# Patient Record
Sex: Female | Born: 1950
Health system: Southern US, Community
[De-identification: ages and names within clinical notes are randomized; demographics above are authoritative.]

## PROBLEM LIST (undated history)

## (undated) DIAGNOSIS — J45909 Unspecified asthma, uncomplicated: Secondary | ICD-10-CM

## (undated) DIAGNOSIS — F329 Major depressive disorder, single episode, unspecified: Secondary | ICD-10-CM

## (undated) DIAGNOSIS — M1990 Unspecified osteoarthritis, unspecified site: Secondary | ICD-10-CM

## (undated) DIAGNOSIS — T7840XA Allergy, unspecified, initial encounter: Secondary | ICD-10-CM

## (undated) DIAGNOSIS — B009 Herpesviral infection, unspecified: Secondary | ICD-10-CM

## (undated) DIAGNOSIS — F32A Depression, unspecified: Secondary | ICD-10-CM

## (undated) HISTORY — DX: Allergy, unspecified, initial encounter: T78.40XA

## (undated) HISTORY — DX: Herpesviral infection, unspecified: B00.9

## (undated) HISTORY — DX: Depression, unspecified: F32.A

## (undated) HISTORY — DX: Major depressive disorder, single episode, unspecified: F32.9

## (undated) HISTORY — DX: Unspecified osteoarthritis, unspecified site: M19.90

## (undated) HISTORY — PX: EYE SURGERY: SHX253

---

## 1982-11-17 HISTORY — PX: APPENDECTOMY: SHX54

## 1999-10-24 ENCOUNTER — Other Ambulatory Visit: Admission: RE | Admit: 1999-10-24 | Discharge: 1999-10-24 | Payer: Self-pay | Admitting: Gynecology

## 2000-11-12 ENCOUNTER — Other Ambulatory Visit: Admission: RE | Admit: 2000-11-12 | Discharge: 2000-11-12 | Payer: Self-pay | Admitting: Gynecology

## 2001-11-29 ENCOUNTER — Other Ambulatory Visit: Admission: RE | Admit: 2001-11-29 | Discharge: 2001-11-29 | Payer: Self-pay | Admitting: Gynecology

## 2002-12-07 ENCOUNTER — Other Ambulatory Visit: Admission: RE | Admit: 2002-12-07 | Discharge: 2002-12-07 | Payer: Self-pay | Admitting: Gynecology

## 2003-12-18 ENCOUNTER — Other Ambulatory Visit: Admission: RE | Admit: 2003-12-18 | Discharge: 2003-12-18 | Payer: Self-pay | Admitting: Gynecology

## 2004-12-30 ENCOUNTER — Other Ambulatory Visit: Admission: RE | Admit: 2004-12-30 | Discharge: 2004-12-30 | Payer: Self-pay | Admitting: Gynecology

## 2005-01-13 ENCOUNTER — Ambulatory Visit: Payer: Self-pay | Admitting: Family Medicine

## 2005-01-30 ENCOUNTER — Ambulatory Visit: Payer: Self-pay | Admitting: Family Medicine

## 2006-01-01 ENCOUNTER — Other Ambulatory Visit: Admission: RE | Admit: 2006-01-01 | Discharge: 2006-01-01 | Payer: Self-pay | Admitting: Gynecology

## 2006-02-10 ENCOUNTER — Ambulatory Visit: Payer: Self-pay | Admitting: Internal Medicine

## 2006-03-16 ENCOUNTER — Encounter (INDEPENDENT_AMBULATORY_CARE_PROVIDER_SITE_OTHER): Payer: Self-pay | Admitting: Specialist

## 2006-03-16 ENCOUNTER — Ambulatory Visit: Payer: Self-pay | Admitting: Internal Medicine

## 2006-04-02 ENCOUNTER — Ambulatory Visit: Payer: Self-pay | Admitting: Family Medicine

## 2006-10-12 ENCOUNTER — Ambulatory Visit: Payer: Self-pay | Admitting: Family Medicine

## 2007-04-08 ENCOUNTER — Ambulatory Visit: Payer: Self-pay | Admitting: Family Medicine

## 2007-04-08 LAB — CONVERTED CEMR LAB
Alkaline Phosphatase: 69 units/L (ref 39–117)
BUN: 14 mg/dL (ref 6–23)
Bilirubin, Direct: 0.1 mg/dL (ref 0.0–0.3)
CO2: 31 meq/L (ref 19–32)
GFR calc Af Amer: 112 mL/min
LDL Cholesterol: 123 mg/dL — ABNORMAL HIGH (ref 0–99)
Potassium: 4.7 meq/L (ref 3.5–5.1)
TSH: 1.44 microintl units/mL (ref 0.35–5.50)
Total CHOL/HDL Ratio: 3.3
Total Protein: 6.6 g/dL (ref 6.0–8.3)
Triglycerides: 67 mg/dL (ref 0–149)

## 2007-04-15 ENCOUNTER — Ambulatory Visit: Payer: Self-pay | Admitting: Family Medicine

## 2007-04-15 LAB — CONVERTED CEMR LAB
ALT: 20 units/L (ref 0–40)
AST: 21 units/L (ref 0–37)
Basophils Absolute: 0 10*3/uL (ref 0.0–0.1)
Bilirubin, Direct: 0.1 mg/dL (ref 0.0–0.3)
CO2: 31 meq/L (ref 19–32)
Calcium: 9.1 mg/dL (ref 8.4–10.5)
Chloride: 110 meq/L (ref 96–112)
Cholesterol: 196 mg/dL (ref 0–200)
Eosinophils Absolute: 0.2 10*3/uL (ref 0.0–0.6)
Glucose, Bld: 87 mg/dL (ref 70–99)
MCHC: 34 g/dL (ref 30.0–36.0)
MCV: 93.2 fL (ref 78.0–100.0)
Neutrophils Relative %: 60.6 % (ref 43.0–77.0)
Platelets: 228 10*3/uL (ref 150–400)
RBC: 3.89 M/uL (ref 3.87–5.11)
Total Protein: 6.6 g/dL (ref 6.0–8.3)

## 2007-04-16 DIAGNOSIS — J309 Allergic rhinitis, unspecified: Secondary | ICD-10-CM

## 2007-05-24 ENCOUNTER — Ambulatory Visit: Payer: Self-pay | Admitting: Family Medicine

## 2007-05-25 ENCOUNTER — Ambulatory Visit: Payer: Self-pay | Admitting: Family Medicine

## 2007-06-01 ENCOUNTER — Ambulatory Visit: Payer: Self-pay | Admitting: Family Medicine

## 2007-08-11 ENCOUNTER — Telehealth: Payer: Self-pay | Admitting: Family Medicine

## 2007-09-08 ENCOUNTER — Telehealth: Payer: Self-pay | Admitting: Family Medicine

## 2007-10-13 ENCOUNTER — Ambulatory Visit: Payer: Self-pay | Admitting: Family Medicine

## 2008-01-31 ENCOUNTER — Ambulatory Visit: Payer: Self-pay | Admitting: Family Medicine

## 2008-01-31 DIAGNOSIS — J209 Acute bronchitis, unspecified: Secondary | ICD-10-CM | POA: Insufficient documentation

## 2008-08-14 ENCOUNTER — Telehealth: Payer: Self-pay | Admitting: *Deleted

## 2009-05-16 ENCOUNTER — Encounter: Payer: Self-pay | Admitting: Family Medicine

## 2009-05-25 ENCOUNTER — Encounter: Payer: Self-pay | Admitting: Family Medicine

## 2009-08-09 ENCOUNTER — Telehealth: Payer: Self-pay | Admitting: Family Medicine

## 2009-08-22 ENCOUNTER — Ambulatory Visit: Payer: Self-pay | Admitting: Family Medicine

## 2009-12-18 ENCOUNTER — Ambulatory Visit: Payer: Self-pay | Admitting: Family Medicine

## 2009-12-18 DIAGNOSIS — M1710 Unilateral primary osteoarthritis, unspecified knee: Secondary | ICD-10-CM | POA: Insufficient documentation

## 2010-02-20 ENCOUNTER — Ambulatory Visit: Payer: Self-pay | Admitting: Family Medicine

## 2010-02-20 DIAGNOSIS — R071 Chest pain on breathing: Secondary | ICD-10-CM

## 2010-02-20 DIAGNOSIS — F339 Major depressive disorder, recurrent, unspecified: Secondary | ICD-10-CM | POA: Insufficient documentation

## 2010-02-20 DIAGNOSIS — F329 Major depressive disorder, single episode, unspecified: Secondary | ICD-10-CM

## 2010-03-19 ENCOUNTER — Ambulatory Visit: Payer: Self-pay | Admitting: Family Medicine

## 2010-05-24 ENCOUNTER — Telehealth: Payer: Self-pay | Admitting: Family Medicine

## 2010-06-27 ENCOUNTER — Telehealth: Payer: Self-pay | Admitting: Family Medicine

## 2010-08-12 ENCOUNTER — Encounter: Payer: Self-pay | Admitting: Family Medicine

## 2010-09-17 ENCOUNTER — Telehealth: Payer: Self-pay | Admitting: Family Medicine

## 2010-09-19 ENCOUNTER — Ambulatory Visit: Payer: Self-pay | Admitting: Family Medicine

## 2010-09-19 DIAGNOSIS — L738 Other specified follicular disorders: Secondary | ICD-10-CM | POA: Insufficient documentation

## 2010-12-17 NOTE — Assessment & Plan Note (Signed)
Summary: drainage/stuffy nose/sneezing/pain in back/cjr   Vital Signs:  Patient profile:   60 year old female Weight:      147 pounds Temp:     99.1 degrees F oral BP sitting:   102 / 76  (left arm) Cuff size:   regular  Vitals Entered By: Kern Reap CMA Duncan Dull) (December 18, 2009 3:46 PM)  Reason for Visit sinus pressure, drainage  History of Present Illness: Veronica Barrett is a 60 y/o mfem. who comes in today for evaluation of 3 problems.  For the past 4, weeks.  She's had had congestion, runny nose, postnasal drip, sinus pressure, unrelieved by her Allegra.  Review of systems otherwise negative.  Past 4, days she's had some right upper thoracic back pain.  No history of trauma.  She's also had a long-standing history of soreness in her hips.  Her mother had osteoarthritis  Allergies: No Known Drug Allergies  Past History:  Past medical, surgical, family and social histories (including risk factors) reviewed, and no changes noted (except as noted below).  Past Medical History: Reviewed history from 04/16/2007 and no changes required. HSV-VAG Allergic rhinitis  Family History: Reviewed history and no changes required. Family History of Arthritis  Social History: Reviewed history from 08/22/2009 and no changes required. Occupation: Married Never Smoked Alcohol use-no Drug use-no Regular exercise-yes  Review of Systems      See HPI  Physical Exam  General:  Well-developed,well-nourished,in no acute distress; alert,appropriate and cooperative throughout examination Head:  Normocephalic and atraumatic without obvious abnormalities. No apparent alopecia or balding. Eyes:  No corneal or conjunctival inflammation noted. EOMI. Perrla. Funduscopic exam benign, without hemorrhages, exudates or papilledema. Vision grossly normal. Ears:  External ear exam shows no significant lesions or deformities.  Otoscopic examination reveals clear canals, tympanic membranes are intact  bilaterally without bulging, retraction, inflammation or discharge. Hearing is grossly normal bilaterally. Nose:  External nasal examination shows no deformity or inflammation. Nasal mucosa are pink and moist without lesions or exudates. Mouth:  Oral mucosa and oropharynx without lesions or exudates.  Teeth in good repair. Neck:  No deformities, masses, or tenderness noted. Chest Wall:  No deformities, masses, or tenderness noted. Lungs:  Normal respiratory effort, chest expands symmetrically. Lungs are clear to auscultation, no crackles or wheezes. Msk:  straight leg raising both right and left lower extremities 75 degrees, external rotation to the right 30 to the left only 15 Pulses:  R and L carotid,radial,femoral,dorsalis pedis and posterior tibial pulses are full and equal bilaterally Extremities:  No clubbing, cyanosis, edema, or deformity noted with normal full range of motion of all joints.   Neurologic:  No cranial nerve deficits noted. Station and gait are normal. Plantar reflexes are down-going bilaterally. DTRs are symmetrical throughout. Sensory, motor and coordinative functions appear intact.   Impression & Recommendations:  Problem # 1:  PRIMARY LOCALIZED OSTEOARTHROSIS LOWER LEG (ICD-715.16) Assessment New  Problem # 2:  ALLERGIC RHINITIS (ICD-477.9) Assessment: Deteriorated  Her updated medication list for this problem includes:    Fexofenadine Hcl 180 Mg Tabs (Fexofenadine hcl) .Marland Kitchen... Take one tab daily  Orders: Prescription Created Electronically [id number])  Complete Medication List: 1)  Boniva 150 Mg Tabs (Ibandronate sodium) .Marland Kitchen.. 1 tab every month 2)  Prednisone 20 Mg Tabs (Prednisone) .... Uad 3)  Zovirax 200 Mg Caps (Acyclovir) .... 3 in am, 2in pm 4)  Fexofenadine Hcl 180 Mg Tabs (Fexofenadine hcl) .... Take one tab daily 5)  Premarin 0.625 Mg/gm Crea (Estrogens, conjugated) .Marland KitchenMarland KitchenMarland Kitchen  Apply 2 xweek  Patient Instructions: 1)  begin prednisone, take two tabs x 3 days,  one x 3 days, a half x 3 days, then half a tablet Monday, Wednesday, Friday, for a two week taper. 2)  You may also use one shot of afrin nasal spray up each nostril at bedtime x 5 nights. 3)  When y are  finished her prednisone intake 600 mg of Motrin twice a day with food for your arthritis, daily Prescriptions: PREDNISONE 20 MG  TABS (PREDNISONE) UAD  #30 x 1   Entered and Authorized by:   Roderick Pee MD   Signed by:   Roderick Pee MD on 12/18/2009   Method used:   [facility]        [facility] [address] [phone]* (retail)       [address]       [place], [place]  [id number]       Ph: [phone] or [phone]       Fax: [phone]   RxID:   [id number]

## 2010-12-17 NOTE — Assessment & Plan Note (Signed)
Summary: 4 week fup//ccm   Vital Signs:  Patient profile:   60 year old female Temp:     98.6 degrees F BP sitting:   108 / 72  (left arm) Cuff size:   regular  Vitals Entered By: Kern Reap CMA Duncan Dull) (Mar 19, 2010 10:24 AM) CC: follow-up visit   CC:  follow-up visit.  History of Present Illness: Veronica Barrett is a 60 year old, married female, nonsmoker, who comes in today for follow-up of depression.  We start on Celexa 20 mg daily.  She states she feels significantly better.  No major side effects except for some lethargy, but she thinks that might be related to  taking Zyrtec for allergic rhinitis  Allergies: No Known Drug Allergies  Review of Systems      See HPI  Physical Exam  General:  Well-developed,well-nourished,in no acute distress; alert,appropriate and cooperative throughout examination Psych:  Cognition and judgment appear intact. Alert and cooperative with normal attention span and concentration. No apparent delusions, illusions, hallucinations   Impression & Recommendations:  Problem # 1:  DEPRESSIVE DISORDER (ICD-311) Assessment Improved  Her updated medication list for this problem includes:    Celexa 20 Mg Tabs (Citalopram hydrobromide) .Marland Kitchen... 1 tab @ bedtime  Complete Medication List: 1)  Prednisone 20 Mg Tabs (Prednisone) .... Uad 2)  Zovirax 200 Mg Caps (Acyclovir) .... 3 in am, 2in pm 3)  Premarin 0.625 Mg/gm Crea (Estrogens, conjugated) .... Apply 2 xweek 4)  Zyrtec Hives Relief 10 Mg Tabs (Cetirizine hcl) .... Once daily 5)  Celexa 20 Mg Tabs (Citalopram hydrobromide) .Marland Kitchen.. 1 tab @ bedtime  Patient Instructions: 1)  Please schedule a follow-up appointment in 6 months. 2)  BMP prior to visit, ICD-9: 3)  Hepatic Panel prior to visit, ICD-9: 4)  Lipid Panel prior to visit, ICD-9: 5)  TSH prior to visit, ICD-9: 6)  CBC w/ Diff prior to visit, ICD-9: 7)  Urine-dip prior to visit, ICD-9:

## 2010-12-17 NOTE — Progress Notes (Signed)
Summary: increase Celexa?  Phone Note Call from Patient   Caller: Patient Call For: Roderick Pee MD Summary of Call: Pt increased  to Celexa 30 mg. 3 weeks ago and is doing great. Initial call taken by: Lynann Beaver CMA,  June 27, 2010 4:56 PM  Follow-up for Phone Call        continue the above dose E. mail her a new prescription for Celexa 20 mg dispense 150  tabs directions.  1 &  half nightly refills x 2 Follow-up by: Roderick Pee MD,  June 27, 2010 5:11 PM  Additional Follow-up for Phone Call Additional follow up Details #1::        Appt Scheduled Today    Additional Follow-up for Phone Call Additional follow up Details #2::    spoke with patient and she only wants a 30 day supply at this time.  rx called in. Follow-up by: Kern Reap CMA Duncan Dull),  June 28, 2010 3:19 PM  New/Updated Medications: CELEXA 20 MG TABS (CITALOPRAM HYDROBROMIDE) one and half  tab @ bedtime Prescriptions: CELEXA 20 MG TABS (CITALOPRAM HYDROBROMIDE) one and half  tab @ bedtime  #60 x 0   Entered by:   Kern Reap CMA (AAMA)   Authorized by:   Roderick Pee MD   Signed by:   Kern Reap CMA (AAMA) on 06/28/2010   Method used:   [facility]        [facility]  [address] [phone]* (retail)       [address]       [place], [place]  [address]       Ph: [phone] or [phone]       Fax: [phone]   RxID:   [id number]

## 2010-12-17 NOTE — Progress Notes (Signed)
Summary: refill  Phone Note Refill Request Message from:  Fax from Pharmacy on May 24, 2010 12:57 PM  Refills Requested: Medication #1:  ZOVIRAX 200 MG  CAPS 3 in am Initial call taken by: Kern Reap CMA Duncan Dull),  May 24, 2010 12:57 PM    Prescriptions: ZOVIRAX 200 MG  CAPS (ACYCLOVIR) 3 in am, 2in pm  #100 x 3   Entered by:   Kern Reap CMA (AAMA)   Authorized by:   Roderick Pee MD   Signed by:   Kern Reap CMA (AAMA) on 05/24/2010   Method used:   Faxed to ...       [facility]* (retail)             ,          Ph: [phone]       Fax: [phone]   RxID:   [id number]

## 2010-12-17 NOTE — Assessment & Plan Note (Signed)
Summary: 6 mo rov/mm   Vital Signs:  Patient profile:   60 year old female Height:      59 inches Weight:      149 pounds BMI:     26.49 Temp:     99.6 degrees F oral BP sitting:   110 / 76  (left arm) Cuff size:   regular  Vitals Entered By: Kern Reap CMA Duncan Dull) (September 19, 2010 10:25 AM) CC: follow-up visit, rash right arm   CC:  follow-up visit and rash right arm.  History of Present Illness: Veronica Barrett is a 60 year old, married female, nonsmoker comes in today for evaluation of a skin rash in to discuss her Celexa.  She's had a skin rash for about a week.  Under right armpit.  She thought initially it was viral, but now appears bacterial.  She takes Celexa 20 mg nightly with good relief of her symptoms.  She wishes to continue that.  Allergies: No Known Drug Allergies  Past History:  Past medical, surgical, family and social histories (including risk factors) reviewed for relevance to current acute and chronic problems.  Past Medical History: Reviewed history from 04/16/2007 and no changes required. HSV-VAG Allergic rhinitis  Family History: Reviewed history from 12/18/2009 and no changes required. Family History of Arthritis  Social History: Reviewed history from 08/22/2009 and no changes required. Occupation: Married Never Smoked Alcohol use-no Drug use-no Regular exercise-yes  Review of Systems      See HPI       Flu Vaccine Consent Questions     Do you have a history of severe allergic reactions to this vaccine? no    Any prior history of allergic reactions to egg and/or gelatin? no    Do you have a sensitivity to the preservative Thimersol? no    Do you have a past history of Guillan-Barre Syndrome? no    Do you currently have an acute febrile illness? no    Have you ever had a severe reaction to latex? no    Vaccine information given and explained to patient? yes    Are you currently pregnant? no    Lot Number:[id number]   Exp  Date:05/17/2011   Site Given  Left Deltoid IM   Physical Exam  General:  Well-developed,well-nourished,in no acute distress; alert,appropriate and cooperative throughout examination Skin:  small pustules, right axillary area   Impression & Recommendations:  Problem # 1:  FOLLICULITIS (ICD-704.8) Assessment New  Problem # 2:  DEPRESSIVE DISORDER (ICD-311) Assessment: Improved  Her updated medication list for this problem includes:    Celexa 20 Mg Tabs (Citalopram hydrobromide) ..... One and half  tab @ bedtime  Complete Medication List: 1)  Zovirax 200 Mg Caps (Acyclovir) .... 3 in am, 2in pm 2)  Premarin 0.625 Mg/gm Crea (Estrogens, conjugated) .... Apply 2 xweek 3)  Zyrtec Hives Relief 10 Mg Tabs (Cetirizine hcl) .... Once daily 4)  Celexa 20 Mg Tabs (Citalopram hydrobromide) .... One and half  tab @ bedtime 5)  Keflex 500 Mg Caps (Cephalexin) .... 2 by mouth two times a day  Other Orders: Admin 1st Vaccine (04540) Flu Vaccine 70yrs + (98119)  Patient Instructions: 1)  begin Keflex two tabs b.i.d., x 10 days call if the lesions do not heal with this medication. 2)  Continue the Celexa 20 mg daily. 3)  Return annually for your annual checkup Prescriptions: CELEXA 20 MG TABS (CITALOPRAM HYDROBROMIDE) one and half  tab @ bedtime  #150 x 3  Entered and Authorized by:   Roderick Pee MD   Signed by:   Roderick Pee MD on 09/19/2010   Method used:   Print then Give to Patient   RxID:   [id number] KEFLEX 500 MG CAPS (CEPHALEXIN) 2 by mouth two times a day  #40 x 1   Entered and Authorized by:   Roderick Pee MD   Signed by:   Roderick Pee MD on 09/19/2010   Method used:   [facility] to        [facility] [address] [phone]* (retail)       [address]       [place], [place]  [id number]       Ph: [phone] or [phone]       Fax: [phone]   RxID:   [id number] CELEXA 20 MG TABS (CITALOPRAM HYDROBROMIDE) one and half   tab @ bedtime  #100 x 3   Entered and Authorized by:   Roderick Pee MD   Signed by:   Roderick Pee MD on 09/19/2010   Method used:   Print then Give to Patient   RxID:   [id number]    Orders Added: 1)  Admin 1st Vaccine [90471] 2)  Flu Vaccine 58yrs + [09323] 3)  Est. Patient Level III [55732]

## 2010-12-17 NOTE — Progress Notes (Signed)
Summary: REFILL REQUEST  Phone Note Refill Request Message from:  Patient    [phone] on September 17, 2010 11:34 AM  Refills Requested: Medication #1:  CELEXA 20 MG TABS one and half  tab @ bedtime.   [facility]  [address].... Pt has appt on 09/19/2010 with Dr Tawanna Cooler.     Initial call taken by: Debbra Riding,  September 17, 2010 11:34 AM    Prescriptions: CELEXA 20 MG TABS (CITALOPRAM HYDROBROMIDE) one and half  tab @ bedtime  #60 Tablet x 0   Entered by:   Kern Reap CMA (AAMA)   Authorized by:   Roderick Pee MD   Signed by:   Kern Reap CMA (AAMA) on 09/17/2010   Method used:   [facility]ronically to        [facility]  [address] [phone]* (retail)       [address]       [place], [place]  [id number]       Ph: [phone] or [phone]       Fax: [phone]   RxID:   [id number]

## 2010-12-17 NOTE — Assessment & Plan Note (Signed)
Summary: INJURIES FROM A FALL (PT FELL ON 02/09/10-EXP PAIN SINCE) // RS   Vital Signs:  Patient profile:   60 year old female Weight:      149 pounds Temp:     98.2 degrees F oral BP sitting:   110 / 78  (right arm) Cuff size:   regular  Vitals Entered By: Kern Reap CMA Duncan Dull) (February 20, 2010 10:30 AM) CC: left breast pain Is Patient Diabetic? No Pain Assessment Patient in pain? yes     Location: breast   CC:  left breast pain.  History of Present Illness: Eliska is a 60 year old, married female, nonsmoker, who comes in today for evaluation of two problems.  On Saturday March, the 26th she slipped in her storage unit and hit her left breast.  She also bruised her left knee and left thigh.  The knee and thigh feel better.  However, she still having a lot of soreness in her left breast.  She's had a history of fibrocystic disease.  Last mammogram 13 months ago.  She's recently felt very overwhelmed at work.  She does total care for her husband Elijah Birk has emotional issues and is on multiple medications.  Now her daughter has told her she is a lesbian, but she's doing with some new stressors at work.  She states she is tearful for unknown reasons and with a labile mood.  She's never taken an antidepressant.  Allergies: No Known Drug Allergies  Past History:  Past medical, surgical, family and social histories (including risk factors) reviewed for relevance to current acute and chronic problems.  Past Medical History: Reviewed history from 04/16/2007 and no changes required. HSV-VAG Allergic rhinitis  Family History: Reviewed history from 12/18/2009 and no changes required. Family History of Arthritis  Social History: Reviewed history from 08/22/2009 and no changes required. Occupation: Married Never Smoked Alcohol use-no Drug use-no Regular exercise-yes  Review of Systems      See HPI  Physical Exam  General:  Well-developed,well-nourished,in no acute  distress; alert,appropriate and cooperative throughout examination Chest Wall:  No deformities, masses, or tenderness noted. Breasts:  she has diffuse fibrocystic changes throughout both breasts.  On palpation none of them cause the discomfort that she is experiencing.  When she holds her breasts up the pain goes away.  The chest wall is palpable.  No other masses Psych:  Oriented X3 and tearful.     Problems:  Medical Problems Added: 1)  Dx of Chest Wall Pain, Acute  (ICD-786.52) 2)  Dx of Depressive Disorder  (ICD-311)  Impression & Recommendations:  Problem # 1:  DEPRESSIVE DISORDER (ICD-311) Assessment New  Her updated medication list for this problem includes:    Celexa 20 Mg Tabs (Citalopram hydrobromide) .Marland Kitchen... 1 tab @ bedtime  Problem # 2:  CHEST WALL PAIN, ACUTE (ONG-295.28) Assessment: New  Complete Medication List: 1)  Prednisone 20 Mg Tabs (Prednisone) .... Uad 2)  Zovirax 200 Mg Caps (Acyclovir) .... 3 in am, 2in pm 3)  Premarin 0.625 Mg/gm Crea (Estrogens, conjugated) .... Apply 2 xweek 4)  Zyrtec Hives Relief 10 Mg Tabs (Cetirizine hcl) .... Once daily 5)  Celexa 20 Mg Tabs (Citalopram hydrobromide) .Marland Kitchen.. 1 tab @ bedtime  Patient Instructions: 1)  begin Motrin 600 mg twice a day with food for the chest wall pain. 2)  Disorder should last for about 4 to 6 weeks and then go away.  Schedule yearly mammogram after the soreness has stopped.  Begin Celexa 20 mg  at bedtime.  Return in 4 weeks for follow-up Prescriptions: CELEXA 20 MG TABS (CITALOPRAM HYDROBROMIDE) 1 tab @ bedtime  #100 x 3   Entered and Authorized by:   Roderick Pee MD   Signed by:   Roderick Pee MD on 02/20/2010   Method used:   Electronically to        [facility] [address] [phone]* (retail)       [address]       [place], [place]  [address]       Ph: [phone] or [phone]       Fax: [phone]   RxID:   [id number]

## 2011-02-20 ENCOUNTER — Ambulatory Visit (INDEPENDENT_AMBULATORY_CARE_PROVIDER_SITE_OTHER): Payer: BC Managed Care – PPO | Admitting: Family Medicine

## 2011-02-20 ENCOUNTER — Encounter: Payer: Self-pay | Admitting: Family Medicine

## 2011-02-20 DIAGNOSIS — L738 Other specified follicular disorders: Secondary | ICD-10-CM

## 2011-02-20 DIAGNOSIS — L255 Unspecified contact dermatitis due to plants, except food: Secondary | ICD-10-CM

## 2011-02-20 MED ORDER — PREDNISONE 20 MG PO TABS: ORAL_TABLET | ORAL | Status: DC

## 2011-02-20 NOTE — Progress Notes (Signed)
  Subjective:    Patient ID: Veronica Barrett, female    DOB: 07/29/1951, 60 y.o.   MRN: [id number]  HPIOne this is a 60 year old female, who comes in today for two problems.  For the past, day.  She's had a recurrence of her contact dermatitis, which involves both arms.  A spot on her face and a spot on her back.  She, thinks she's also had a recurrence of the folliculitis in the right axillary area    Review of Systems    General and dermatologic review of systems otherwise negative Objective:   Physical Exam    Well-developed well-nourished, female, in no acute distress.  Examination of the skin shows one pimple in the right axillary area.  It was cleaned with alcohol and cover with a Band-Aid and antibiotic ointment.  She has extent extensive contact dermatitis, right and left arm    Assessment & Plan:  Contact dermatitis,,,,,,,,,,, prednisone burst and taper.  Folliculitis,,,,,,,,,, clean with alcohol daily, apply antibiotic ointment and Band-Aid Keflex p.r.n.

## 2011-02-20 NOTE — Patient Instructions (Signed)
Take the prednisone as directed.  Clean, the . Follicular lesion twice daily with alcohol applied in ATB Ointment twice daily.  Restart the Keflex if the lesions get worse

## 2011-06-05 ENCOUNTER — Telehealth: Payer: Self-pay | Admitting: Family Medicine

## 2011-06-05 MED ORDER — ACYCLOVIR 200 MG PO CAPS: 200.0000 mg | ORAL_CAPSULE | Freq: Every day | ORAL | Status: DC

## 2011-06-05 NOTE — Telephone Encounter (Signed)
[facility] [address]. Was using mail order pharmacy.

## 2011-07-14 ENCOUNTER — Other Ambulatory Visit: Payer: Self-pay | Admitting: Gynecology

## 2011-10-14 ENCOUNTER — Other Ambulatory Visit: Payer: Self-pay | Admitting: Family Medicine

## 2011-11-24 ENCOUNTER — Ambulatory Visit (INDEPENDENT_AMBULATORY_CARE_PROVIDER_SITE_OTHER)
Admission: RE | Admit: 2011-11-24 | Discharge: 2011-11-24 | Disposition: A | Discharge status: Home / Self Care | Payer: BC Managed Care – PPO | Source: Ambulatory Visit | Attending: Family Medicine | Admitting: Family Medicine

## 2011-11-24 ENCOUNTER — Ambulatory Visit (INDEPENDENT_AMBULATORY_CARE_PROVIDER_SITE_OTHER): Payer: BC Managed Care – PPO | Admitting: Family Medicine

## 2011-11-24 ENCOUNTER — Encounter: Payer: Self-pay | Admitting: Family Medicine

## 2011-11-24 VITALS — BP 108/70 | Temp 97.8°F | Wt 149.0 lb

## 2011-11-24 DIAGNOSIS — M1710 Unilateral primary osteoarthritis, unspecified knee: Secondary | ICD-10-CM

## 2011-11-24 NOTE — Patient Instructions (Signed)
Go to the main office now for x-rays of her hips.  Call  [facility] and asked to see Dr. Albertha Ghee or Dr. Norlene Campbell

## 2011-11-24 NOTE — Progress Notes (Signed)
  Subjective:    Patient ID: Veronica Barrett, female    DOB: [birth year], 61 y.o.   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  She states her pain in her left hip started about July.  Now it seems to be getting worse.  No history of trauma.  Neurologic review of systems otherwise negative   Review of Systems    General neurologic orthopedic review of systems negative Objective:   Physical Exam  Barrett-developed Barrett-nourished, female, in no acute distress.  Examination of both legs.  They appear normal.  Circulation, normal sensation.  Muscle strength and reflexes.  Normal.  Unable to externally rotate her Left  hip      Assessment & Plan:  Severe degenerative joint change.  Left hip.  Plan x-rays or so consult

## 2012-01-12 ENCOUNTER — Encounter: Payer: Self-pay | Admitting: Internal Medicine

## 2012-10-02 ENCOUNTER — Other Ambulatory Visit: Payer: Self-pay | Admitting: Family Medicine

## 2012-10-12 ENCOUNTER — Other Ambulatory Visit: Payer: Self-pay | Admitting: Family Medicine

## 2012-10-25 ENCOUNTER — Other Ambulatory Visit: Payer: Self-pay | Admitting: Family Medicine

## 2012-12-28 ENCOUNTER — Ambulatory Visit (INDEPENDENT_AMBULATORY_CARE_PROVIDER_SITE_OTHER): Payer: BC Managed Care – PPO | Admitting: Family Medicine

## 2012-12-28 ENCOUNTER — Encounter: Payer: Self-pay | Admitting: Family Medicine

## 2012-12-28 VITALS — BP 110/70 | Temp 98.5°F | Wt 158.0 lb

## 2012-12-28 DIAGNOSIS — J309 Allergic rhinitis, unspecified: Secondary | ICD-10-CM

## 2012-12-28 DIAGNOSIS — R05 Cough: Secondary | ICD-10-CM

## 2012-12-28 DIAGNOSIS — R071 Chest pain on breathing: Secondary | ICD-10-CM

## 2012-12-28 DIAGNOSIS — R059 Cough, unspecified: Secondary | ICD-10-CM

## 2012-12-28 MED ORDER — PREDNISONE 20 MG PO TABS: ORAL_TABLET | ORAL | Status: DC

## 2012-12-28 NOTE — Progress Notes (Signed)
  Subjective:    Patient ID: Veronica Barrett, female    DOB: 1951-03-04, 62 y.o.   MRN: [id number]  HPI Veronica Barrett is a 62 year old married female nonsmoker who comes in today for evaluation of 2 problems  She developed a viral syndrome around Christmas time and the cold went away but the cough has persisted. She has no fever chills or sputum production except occasionally she says in the morning she will cough up some blood but she thinks is coming from her sinus. She is a nonsmoker  She also had one episode that lasts about 15 minutes of right upper anterior chest wall pain the woke up in the middle the night. Last general medical exam was a while back  GYN-wise she's been asymptomatic it recommend every 3 your Paps   Review of Systems    cardiopulmonary review of systems otherwise negative no exertional chest pain Objective:   Physical Exam Well-developed and nourished female no acute distress cardiopulmonary exam normal except for some mild symmetrical late expiratory wheezing       Assessment & Plan:

## 2012-12-28 NOTE — Patient Instructions (Addendum)
Take the prednisone as directed  Return sometime in the next 2-3 months for general physical examination  Fasting labs one week prior

## 2013-02-10 ENCOUNTER — Other Ambulatory Visit: Payer: BC Managed Care – PPO

## 2013-02-17 ENCOUNTER — Encounter: Payer: BC Managed Care – PPO | Admitting: Family Medicine

## 2013-04-14 ENCOUNTER — Other Ambulatory Visit (INDEPENDENT_AMBULATORY_CARE_PROVIDER_SITE_OTHER): Payer: BC Managed Care – PPO

## 2013-04-14 DIAGNOSIS — R071 Chest pain on breathing: Secondary | ICD-10-CM

## 2013-04-14 LAB — BASIC METABOLIC PANEL
BUN: 15 mg/dL (ref 6–23)
CO2: 28 mEq/L (ref 19–32)
Chloride: 104 mEq/L (ref 96–112)
Creatinine, Ser: 0.6 mg/dL (ref 0.4–1.2)
Glucose, Bld: 76 mg/dL (ref 70–99)

## 2013-04-14 LAB — POCT URINALYSIS DIPSTICK
Ketones, UA: NEGATIVE
Leukocytes, UA: NEGATIVE
Protein, UA: NEGATIVE
pH, UA: 7

## 2013-04-14 LAB — CBC WITH DIFFERENTIAL/PLATELET
Basophils Absolute: 0 10*3/uL (ref 0.0–0.1)
Eosinophils Relative: 3.1 % (ref 0.0–5.0)
HCT: 36.5 % (ref 36.0–46.0)
Lymphs Abs: 1.6 10*3/uL (ref 0.7–4.0)
MCV: 93.2 fl (ref 78.0–100.0)
Monocytes Absolute: 0.4 10*3/uL (ref 0.1–1.0)
Neutro Abs: 2.8 10*3/uL (ref 1.4–7.7)
Platelets: 284 10*3/uL (ref 150.0–400.0)
RDW: 13.6 % (ref 11.5–14.6)

## 2013-04-14 LAB — HEPATIC FUNCTION PANEL
AST: 15 U/L (ref 0–37)
Alkaline Phosphatase: 72 U/L (ref 39–117)
Total Bilirubin: 0.8 mg/dL (ref 0.3–1.2)

## 2013-04-14 LAB — LIPID PANEL
HDL: 60.7 mg/dL (ref >39.00)
LDL Cholesterol: 123 mg/dL — ABNORMAL HIGH (ref 0–99)
Total CHOL/HDL Ratio: 3
VLDL: 12.2 mg/dL (ref 0.0–40.0)

## 2013-04-15 ENCOUNTER — Encounter: Payer: Self-pay | Admitting: Internal Medicine

## 2013-04-21 ENCOUNTER — Ambulatory Visit (INDEPENDENT_AMBULATORY_CARE_PROVIDER_SITE_OTHER): Payer: BC Managed Care – PPO | Admitting: Family Medicine

## 2013-04-21 ENCOUNTER — Other Ambulatory Visit (HOSPITAL_COMMUNITY)
Admission: RE | Admit: 2013-04-21 | Discharge: 2013-04-21 | Disposition: A | Discharge status: Home / Self Care | Payer: BC Managed Care – PPO | Source: Ambulatory Visit | Attending: Family Medicine | Admitting: Family Medicine

## 2013-04-21 ENCOUNTER — Encounter: Payer: Self-pay | Admitting: Family Medicine

## 2013-04-21 VITALS — BP 110/70 | Temp 98.3°F | Ht 65.75 in | Wt 155.0 lb

## 2013-04-21 DIAGNOSIS — Z01419 Encounter for gynecological examination (general) (routine) without abnormal findings: Secondary | ICD-10-CM | POA: Insufficient documentation

## 2013-04-21 DIAGNOSIS — Z23 Encounter for immunization: Secondary | ICD-10-CM

## 2013-04-21 DIAGNOSIS — Z0000 Encounter for general adult medical examination without abnormal findings: Secondary | ICD-10-CM

## 2013-04-21 DIAGNOSIS — B009 Herpesviral infection, unspecified: Secondary | ICD-10-CM

## 2013-04-21 DIAGNOSIS — M169 Osteoarthritis of hip, unspecified: Secondary | ICD-10-CM | POA: Insufficient documentation

## 2013-04-21 MED ORDER — CITALOPRAM HYDROBROMIDE 20 MG PO TABS: ORAL_TABLET | ORAL | Status: DC

## 2013-04-21 MED ORDER — ESTROGENS, CONJUGATED 0.625 MG/GM VA CREA: TOPICAL_CREAM | VAGINAL | Status: DC

## 2013-04-21 MED ORDER — ACYCLOVIR 200 MG PO CAPS: ORAL_CAPSULE | ORAL | Status: DC

## 2013-04-21 NOTE — Progress Notes (Signed)
  Subjective:    Patient ID: Veronica Barrett, female    DOB: Sep 29, 1951, 62 y.o.   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 She takes acyclovir when necessary  She takes Zyrtec 10 mg each bedtime for allergic rhinitis and Celexa 20 mg tabs........... 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   HENT: Negative.   Eyes: Negative.   Respiratory: Negative.   Cardiovascular: Negative.   Gastrointestinal: Negative.   Genitourinary: Negative.   Musculoskeletal: Negative.   Neurological: Negative.   Psychiatric/Behavioral: Negative.        Objective:   Physical Exam  Constitutional: She appears well-developed and well-nourished.  HENT:  Head: Normocephalic and atraumatic.  Right Ear: External ear normal.  Left Ear: External ear normal.  Nose: Nose normal.  Mouth/Throat: Oropharynx is clear and moist.  Eyes: EOM are normal. Pupils are equal, round, and reactive to light.  Neck: Normal range of motion. Neck supple. No thyromegaly present.  Cardiovascular: Normal rate, regular rhythm, normal heart sounds and intact distal pulses.  Exam reveals no gallop and no friction rub.   No murmur heard. Pulmonary/Chest: Effort normal and breath sounds normal.  Abdominal: Soft. Bowel sounds are normal. She exhibits no distension and no mass. There is no tenderness. There is no  rebound.  Genitourinary: Vagina normal and uterus normal. Guaiac negative stool. No vaginal discharge found.  Bilateral breast exam shows multiple fibrocystic changes throughout both breasts thickening especially right breast 10:00 at the periphery of the breast tissue  Musculoskeletal: Normal range of motion.  Lymphadenopathy:    She has no cervical adenopathy.  Neurological: She is alert. She has normal reflexes. No cranial nerve deficit. She exhibits normal muscle tone. Coordination normal.  Skin: Skin is warm and dry.  Psychiatric: She has a normal mood and affect. Her behavior is normal. Judgment and thought content normal.   Severe limitation of external rotation left hip only 15.,,,,,,, recent orthopedic evaluation so to osteoarthritis in both hips worse in the left.       Assessment & Plan:  Healthy female  Allergic rhinitis continue Zyrtec  Recurrent HSV 1 right buttocks continue acyclovir when necessary  History of mild depression continue Celexa 30 mg daily  Postmenopausal vaginal dryness Premarin vaginal cream twice weekly  Fibrocystic breast changes recommend BSE monthly and 3-D mammogram  Osteoarthritis right and left hip worse on the right Motrin 400 twice a day exercise at some point she continue to total hip replacement.  Information given on shingles

## 2013-04-21 NOTE — Patient Instructions (Signed)
Motrin 400 mg twice daily with food  Daily exercises  Sunscreens SPF 50+  Continue your other medications  D. Use the vaginal cream twice weekly  Return in one year for general physical exam sooner if any problems  Call the breast Center and get set up for a 3-D mammogram because of your history of multiple fibrocystic changes and thickening

## 2013-05-04 ENCOUNTER — Other Ambulatory Visit: Payer: Self-pay

## 2013-05-04 DIAGNOSIS — Z1231 Encounter for screening mammogram for malignant neoplasm of breast: Secondary | ICD-10-CM

## 2013-05-09 ENCOUNTER — Ambulatory Visit
Admission: RE | Admit: 2013-05-09 | Discharge: 2013-05-09 | Disposition: A | Discharge status: Home / Self Care | Payer: BC Managed Care – PPO | Source: Ambulatory Visit

## 2013-05-09 DIAGNOSIS — Z1231 Encounter for screening mammogram for malignant neoplasm of breast: Secondary | ICD-10-CM

## 2013-06-01 ENCOUNTER — Ambulatory Visit (AMBULATORY_SURGERY_CENTER): Payer: BC Managed Care – PPO | Admitting: *Deleted

## 2013-06-01 ENCOUNTER — Encounter: Payer: Self-pay | Admitting: Internal Medicine

## 2013-06-01 VITALS — Ht 65.5 in | Wt 159.4 lb

## 2013-06-01 DIAGNOSIS — Z86010 Personal history of colonic polyps: Secondary | ICD-10-CM

## 2013-06-01 MED ORDER — MOVIPREP 100 G PO SOLR: ORAL | Status: DC

## 2013-06-01 NOTE — Progress Notes (Signed)
No allergies to eggs or soy. No problems with anesthesia.  

## 2013-06-15 ENCOUNTER — Encounter: Payer: Self-pay | Admitting: Internal Medicine

## 2013-06-15 ENCOUNTER — Ambulatory Visit (AMBULATORY_SURGERY_CENTER): Payer: BC Managed Care – PPO | Admitting: Internal Medicine

## 2013-06-15 VITALS — BP 128/77 | HR 45 | Temp 97.3°F | Resp 15 | Ht 65.5 in | Wt 159.0 lb

## 2013-06-15 DIAGNOSIS — D126 Benign neoplasm of colon, unspecified: Secondary | ICD-10-CM

## 2013-06-15 DIAGNOSIS — Z86010 Personal history of colonic polyps: Secondary | ICD-10-CM

## 2013-06-15 MED ORDER — SODIUM CHLORIDE 0.9 % IV SOLN: 500.0000 mL | INTRAVENOUS | Status: DC

## 2013-06-15 NOTE — Patient Instructions (Addendum)
YOU HAD AN ENDOSCOPIC PROCEDURE TODAY AT THE [facility]: Refer to the procedure report that was given to you for any specific questions about what was found during the examination.  If the procedure report does not answer your questions, please call your gastroenterologist to clarify.  If you requested that your care partner not be given the details of your procedure findings, then the procedure report has been included in a sealed envelope for you to review at your convenience later.  YOU SHOULD EXPECT: Some feelings of bloating in the abdomen. Passage of more gas than usual.  Walking can help get rid of the air that was put into your GI tract during the procedure and reduce the bloating. If you had a lower endoscopy (such as a colonoscopy or flexible sigmoidoscopy) you may notice spotting of blood in your stool or on the toilet paper. If you underwent a bowel prep for your procedure, then you may not have a normal bowel movement for a few days.  DIET: Your first meal following the procedure should be a light meal and then it is ok to progress to your normal diet.  A half-sandwich or bowl of soup is an example of a good first meal.  Heavy or fried foods are harder to digest and may make you feel nauseous or bloated.  Likewise meals heavy in dairy and vegetables can cause extra gas to form and this can also increase the bloating.  Drink plenty of fluids but you should avoid alcoholic beverages for 24 hours.  ACTIVITY: Your care partner should take you home directly after the procedure.  You should plan to take it easy, moving slowly for the rest of the day.  You can resume normal activity the day after the procedure however you should NOT DRIVE or use heavy machinery for 24 hours (because of the sedation medicines used during the test).    SYMPTOMS TO REPORT IMMEDIATELY: A gastroenterologist can be reached at any hour.  During normal business hours, 8:30 AM to 5:00 PM Monday through Friday,  call [phone].  After hours and on weekends, please call the GI answering service at [phone] who will take a message and have the physician on call contact you.   Following lower endoscopy (colonoscopy or flexible sigmoidoscopy):  Excessive amounts of blood in the stool  Significant tenderness or worsening of abdominal pains  Swelling of the abdomen that is new, acute  Fever of 100F or higher  FOLLOW UP: If any biopsies were taken you will be contacted by phone or by letter within the next 1-3 weeks.  Call your gastroenterologist if you have not heard about the biopsies in 3 weeks.  Our staff will call the home number listed on your records the next business day following your procedure to check on you and address any questions or concerns that you may have at that time regarding the information given to you following your procedure. This is a courtesy call and so if there is no answer at the home number and we have not heard from you through the emergency physician on call, we will assume that you have returned to your regular daily activities without incident.  SIGNATURES/CONFIDENTIALITY: You and/or your care partner have signed paperwork which will be entered into your electronic medical record.  These signatures attest to the fact that that the information above on your After Visit Summary has been reviewed and is understood.  Full responsibility of the confidentiality of this   discharge information lies with you and/or your care-partner.  Polyp and high fiber diet information given. 

## 2013-06-15 NOTE — Progress Notes (Signed)
Called to room to assist during endoscopic procedure.  Patient ID and intended procedure confirmed with present staff. Received instructions for my participation in the procedure from the performing physician.  

## 2013-06-15 NOTE — Op Note (Signed)
[facility] [address] [place], [address]   COLONOSCOPY PROCEDURE REPORT  PATIENT: Veronica, Barrett  MR#: [id number] BIRTHDATE: [birth year] , 62  yrs. old GENDER: Female ENDOSCOPIST: Hart Carwin, MD REFERRED AO:ZHYQMV colonoscopy PROCEDURE DATE:  06/15/2013 PROCEDURE:   Colonoscopy with snare polypectomy First Screening Colonoscopy - Avg.  risk and is 50 yrs.  old or older - No.  Prior Negative Screening - Now for repeat screening. N/A  History of Adenoma - Now for follow-up colonoscopy & has been > or = to 3 yrs.  Yes hx of adenoma.  Has been 3 or more years since last colonoscopy.  Polyps Removed Today? Yes. ASA CLASS:   Class II INDICATIONS:Patient's personal history of colon polyps., hyperplastic polyp 2007 MEDICATIONS: MAC sedation, administered by CRNA and propofol (Diprivan) 250mg  IV  DESCRIPTION OF PROCEDURE:   After the risks benefits and alternatives of the procedure were thoroughly explained, informed consent was obtained.  A digital rectal exam revealed no abnormalities of the rectum.   The LB [id number]-H190 [id number]  endoscope was introduced through the anus and advanced to the cecum, which was identified by both the appendix and ileocecal valve. No adverse events experienced.   The quality of the prep was good, using MoviPrep  The instrument was then slowly withdrawn as the colon was fully examined.      COLON FINDINGS: A smooth sessile polyp ranging between 3-86mm in size was found in the sigmoid colon.  A polypectomy was performed with a cold snare.  The resection was complete and the polyp tissue was completely retrieved.  Retroflexed views revealed no abnormalities. The time to cecum=  .  Withdrawal time=  .  The scope was withdrawn and the procedure completed. COMPLICATIONS: There were no complications.  ENDOSCOPIC IMPRESSION: Sessile polyp ranging between 3-76mm in size was found in the sigmoid colon; polypectomy was performed with a cold  snare  RECOMMENDATIONS: 1.  Await pathology results 2.  High fiber diet   eSigned:  Hart Carwin, MD 06/15/2013 10:43 AM   cc:

## 2013-06-15 NOTE — Progress Notes (Signed)
Lidocaine-40mg IV prior to Propofol InductionPropofol given over incremental dosages 

## 2013-06-15 NOTE — Progress Notes (Signed)
Patient did not experience any of the following events: a burn prior to discharge; a fall within the facility; wrong site/side/patient/procedure/implant event; or a hospital transfer or hospital admission upon discharge from the facility. (G8907) Patient did not have preoperative order for IV antibiotic SSI prophylaxis. (G8918)  

## 2013-06-16 ENCOUNTER — Telehealth: Payer: Self-pay | Admitting: *Deleted

## 2013-06-16 NOTE — Telephone Encounter (Signed)
  Follow up Call-  Call back number 06/15/2013  Post procedure Call Back phone  # [phone]  Permission to leave phone message Yes     Patient questions:  Do you have a fever, pain , or abdominal swelling? no Pain Score  0 *  Have you tolerated food without any problems? yes  Have you been able to return to your normal activities? yes  Do you have any questions about your discharge instructions: Diet   no Medications  no Follow up visit  no  Do you have questions or concerns about your Care? no  Actions: * If pain score is 4 or above: No action needed, pain <4.

## 2013-06-21 ENCOUNTER — Encounter: Payer: Self-pay | Admitting: Internal Medicine

## 2013-10-04 ENCOUNTER — Other Ambulatory Visit: Payer: Self-pay | Admitting: Family Medicine

## 2013-11-14 ENCOUNTER — Ambulatory Visit (INDEPENDENT_AMBULATORY_CARE_PROVIDER_SITE_OTHER): Payer: BC Managed Care – PPO | Admitting: Physician Assistant

## 2013-11-14 VITALS — BP 114/68 | HR 67 | Temp 99.2°F | Resp 18 | Ht 66.0 in | Wt 156.0 lb

## 2013-11-14 DIAGNOSIS — J029 Acute pharyngitis, unspecified: Secondary | ICD-10-CM

## 2013-11-14 DIAGNOSIS — J069 Acute upper respiratory infection, unspecified: Secondary | ICD-10-CM

## 2013-11-14 DIAGNOSIS — B309 Viral conjunctivitis, unspecified: Secondary | ICD-10-CM

## 2013-11-14 LAB — POCT RAPID STREP A (OFFICE): Rapid Strep A Screen: NEGATIVE

## 2013-11-14 MED ORDER — GUAIFENESIN ER 1200 MG PO TB12: 1.0000 | ORAL_TABLET | Freq: Two times a day (BID) | ORAL | Status: AC

## 2013-11-14 MED ORDER — HYDROCOD POLST-CHLORPHEN POLST 10-8 MG/5ML PO LQCR: 5.0000 mL | Freq: Two times a day (BID) | ORAL | Status: AC

## 2013-11-14 NOTE — Patient Instructions (Signed)
Keep contacts out of eye while they are red.  You should get rid of the lenses that are in your eyes today.

## 2013-11-14 NOTE — Progress Notes (Signed)
   Subjective:    Patient ID: Veronica Barrett, female    DOB: April 25, 1951, 62 y.o.   MRN: [id number]  HPI Pt presents to clinic with cold symptoms for the last week.  She has congestion which has gotten a little better and PND with a cough.  She takes Zyrtec daily but after she got sick her symptoms got worse when she ran out of the Zyrtec.  She then woke up yesterday am with her right eye matted but no more drainage throughout the day.  She feels like the eye is less red today and feels a little less irritated.  She does feel like her left eye is started to get irritated.  She is having trouble seeing at work.  She wears contacts and she has her contacts in today.  She is typically on Restasis but has not been on it for several days.  Each day she is sick her throat gets more sore.  OTC meds - sudafed, No sick contacts Review of Systems  Constitutional: Negative for fever and chills.  HENT: Positive for congestion, postnasal drip, rhinorrhea and sore throat.   Eyes: Positive for discharge (matted in the am yesterday), itching and visual disturbance (mild). Negative for photophobia and pain.  Respiratory: Positive for cough (grayish).   Musculoskeletal: Negative for myalgias.  Allergic/Immunologic: Positive for environmental allergies.  Neurological: Positive for headaches (intermittent).  Psychiatric/Behavioral: Negative for sleep disturbance.       Objective:   Physical Exam  Vitals reviewed. Constitutional: She is oriented to person, place, and time. She appears well-developed and well-nourished.  HENT:  Head: Normocephalic and atraumatic.  Right Ear: Hearing, tympanic membrane, external ear and ear canal normal.  Left Ear: Hearing, tympanic membrane, external ear and ear canal normal.  Nose: Nose normal.  Mouth/Throat: Uvula is midline and mucous membranes are normal. Posterior oropharyngeal erythema present. No posterior oropharyngeal edema.  Eyes: Conjunctivae are normal.    Cardiovascular: Normal rate, regular rhythm and normal heart sounds.   No murmur heard. Pulmonary/Chest: Effort normal and breath sounds normal. She has no wheezes.  Lymphadenopathy:    She has cervical adenopathy (Ac tender but not really enlarged).  Neurological: She is alert and oriented to person, place, and time.  Skin: Skin is warm and dry.  Psychiatric: She has a normal mood and affect. Her behavior is normal. Judgment and thought content normal.    Results for orders placed in visit on 11/14/13  POCT RAPID STREP A (OFFICE)      Result Value Range   Rapid Strep A Screen Negative  Negative       Assessment & Plan:  Viral URI with cough - Plan: Guaifenesin (MUCINEX MAXIMUM STRENGTH) 1200 MG TB12, chlorpheniramine-HYDROcodone (TUSSIONEX PENNKINETIC ER) 10-8 MG/5ML LQCR  Viral conjunctivitis -   Sore throat - Plan: POCT rapid strep A -   Please push fluids.  Tylenol and Motrin for fever and body aches.    Benny Lennert PA-C 11/14/2013 2:43 PM

## 2014-04-05 ENCOUNTER — Telehealth: Payer: Self-pay | Admitting: Family Medicine

## 2014-04-05 NOTE — Telephone Encounter (Signed)
Pt would like dr todd recommendation for orthopaedic md who specialist in hip replacement.

## 2014-04-05 NOTE — Telephone Encounter (Signed)
Spoke with patient.

## 2014-07-05 ENCOUNTER — Telehealth: Payer: Self-pay | Admitting: Family Medicine

## 2014-07-05 NOTE — Telephone Encounter (Signed)
Pt would like to speak with you regarding finding a hip doctor, pt states she does not a referral but what dr. Sherren Mocha opinion on dr. Jannett Celestine.

## 2014-07-06 NOTE — Telephone Encounter (Signed)
Left message on machine for patient. Okay per Dr Sherren Mocha

## 2014-07-14 ENCOUNTER — Other Ambulatory Visit: Payer: Self-pay | Admitting: Family Medicine

## 2014-07-30 ENCOUNTER — Other Ambulatory Visit: Payer: Self-pay | Admitting: Family Medicine

## 2014-08-03 ENCOUNTER — Other Ambulatory Visit: Payer: Self-pay | Admitting: Family Medicine

## 2014-10-02 ENCOUNTER — Encounter: Payer: Self-pay | Admitting: Family Medicine

## 2014-10-02 ENCOUNTER — Ambulatory Visit (INDEPENDENT_AMBULATORY_CARE_PROVIDER_SITE_OTHER): Payer: BC Managed Care – PPO | Admitting: Family Medicine

## 2014-10-02 VITALS — BP 124/80 | Temp 98.5°F | Wt 161.0 lb

## 2014-10-02 DIAGNOSIS — Z23 Encounter for immunization: Secondary | ICD-10-CM

## 2014-10-02 DIAGNOSIS — M160 Bilateral primary osteoarthritis of hip: Secondary | ICD-10-CM

## 2014-10-02 DIAGNOSIS — Z01818 Encounter for other preprocedural examination: Secondary | ICD-10-CM

## 2014-10-02 MED ORDER — CITALOPRAM HYDROBROMIDE 20 MG PO TABS: ORAL_TABLET | ORAL | Status: DC

## 2014-10-02 NOTE — Progress Notes (Signed)
Pre visit review using our clinic review tool, if applicable. No additional management support is needed unless otherwise documented below in the visit note. 

## 2014-10-02 NOTE — Patient Instructions (Addendum)
Good luck in your surgery  Follow-up as outlined  Return for your physical exam this winter a month or 2 after surgery

## 2014-10-02 NOTE — Progress Notes (Signed)
   Subjective:    Patient ID: Veronica Barrett, female    DOB: 1951-04-07, 63 y.o.   MRN: [id number]  HPI Veronica Barrett is a 63 year old married female nonsmoker who comes in today for presurgical appointment because she's going to have a total left hip by Dr. Farrel Conners  I sent her to see them a while back because of pain in her left hip. X-ray shows degenerative changes in both hips worse on the left than the right. Her right hip is actually asymptomatic pain wise although she has bone rubbing on bone on her right hip also.  No history of trauma that she recalls arthritis and degenerative joint disease does run in her family her weight is always been good she is in the 150-160 range she has no history of hypertension diabetes hyperlipidemia and she is a nonsmoker.  Surgical risk low   Review of Systems    review of systems otherwise negative Objective:   Physical Exam  Well-developed well-nourished female no acute distress vital signs stable she's afebrile BP right arm sitting position 124/80 cardiopulmonary exam normal      Assessment & Plan:  preop exam normal,,,,,,,,, okay for surgery,,,,,,, risk low

## 2014-10-03 ENCOUNTER — Telehealth: Payer: Self-pay | Admitting: Family Medicine

## 2014-10-03 NOTE — Telephone Encounter (Signed)
noted 

## 2014-10-03 NOTE — Telephone Encounter (Signed)
Patient states she does not need a letter of medical necessity.

## 2014-10-06 NOTE — H&P (Signed)
TOTAL HIP ADMISSION H&P  Patient is admitted for left total hip arthroplasty, anterior approach.  Subjective:  Chief Complaint:    Left hip primary OA / pain  HPI: Veronica Barrett, 63 y.o. female, has a history of pain and functional disability in the left hip(s) due to arthritis and patient has failed non-surgical conservative treatments for greater than 12 weeks to include NSAID's and/or analgesics, corticosteriod injections and activity modification.  Onset of symptoms was gradual starting years ago with gradually worsening course since that time.The patient noted no past surgery on the left hip(s).  Patient currently rates pain in the left hip at 5 out of 10 with activity. Patient has worsening of pain with activity and weight bearing, trendelenberg gait, pain that interfers with activities of daily living and pain with passive range of motion. Patient has evidence of periarticular osteophytes and joint space narrowing by imaging studies. This condition presents safety issues increasing the risk of falls.  There is no current active infection.  Risks, benefits and expectations were discussed with the patient.  Risks including but not limited to the risk of anesthesia, blood clots, nerve damage, blood vessel damage, failure of the prosthesis, infection and up to and including death.  Patient understand the risks, benefits and expectations and wishes to proceed with surgery.   PCP: Joycelyn Man, MD  D/C Plans:      Home with HHPT  Post-op Meds:       No Rx given  Tranexamic Acid:      To be given - IV    Decadron:      Is to be given  FYI:     ASA post-op  Norco post-op  Do NOT reorder Restasis while in the hospital    Patient Active Problem List   Diagnosis Date Noted  . Recurrent HSV (herpes simplex virus) 04/21/2013  . Osteoarthritis of hip 04/21/2013  . DEPRESSIVE DISORDER 02/20/2010  . ALLERGIC RHINITIS 04/16/2007   Past Medical History  Diagnosis Date  . HSV infection     vag  . Allergy   . Depression   . Arthritis     Past Surgical History  Procedure Laterality Date  . Appendectomy  1984    No prescriptions prior to admission   No Known Allergies   History  Substance Use Topics  . Smoking status: Never Smoker   . Smokeless tobacco: Never Used  . Alcohol Use: 4.2 oz/week    7 Glasses of wine per week    Family History  Problem Relation Age of Onset  . Arthritis Other   . Colon cancer Neg Hx   . Cancer Father   . Cancer Sister      Review of Systems  Constitutional: Negative.   HENT: Negative.   Eyes: Negative.   Respiratory: Negative.   Cardiovascular: Negative.   Gastrointestinal: Negative.   Genitourinary: Negative.   Musculoskeletal: Positive for back pain and joint pain.  Skin: Negative.   Neurological: Negative.   Endo/Heme/Allergies: Positive for environmental allergies.  Psychiatric/Behavioral: Positive for depression.    Objective:  Physical Exam  Constitutional: She is oriented to person, place, and time. She appears well-developed and well-nourished.  HENT:  Head: Normocephalic and atraumatic.  Eyes: Pupils are equal, round, and reactive to light.  Neck: Neck supple. No JVD present. No tracheal deviation present. No thyromegaly present.  Cardiovascular: Normal rate, regular rhythm, normal heart sounds and intact distal pulses.   Respiratory: Effort normal and breath sounds normal. No  stridor. No respiratory distress. She has no wheezes.  GI: Soft. There is no tenderness. There is no guarding.  Musculoskeletal:       Left hip: She exhibits decreased range of motion, decreased strength, tenderness and bony tenderness. She exhibits no swelling, no deformity and no laceration.  Lymphadenopathy:    She has no cervical adenopathy.  Neurological: She is alert and oriented to person, place, and time.  Skin: Skin is warm and dry.  Psychiatric: She has a normal mood and affect.      Labs:  Estimated body mass index  is 25.19 kg/(m^2) as calculated from the following:   Height as of 11/14/13: 5\' 6"  (1.676 m).   Weight as of 11/14/13: 70.761 kg (156 lb).   Imaging Review Plain radiographs demonstrate severe degenerative joint disease of the left hip(s). The bone quality appears to be good for age and reported activity level.  Assessment/Plan:  End stage arthritis, left hip(s)  The patient history, physical examination, clinical judgement of the provider and imaging studies are consistent with end stage degenerative joint disease of the left hip(s) and total hip arthroplasty is deemed medically necessary. The treatment options including medical management, injection therapy, arthroscopy and arthroplasty were discussed at length. The risks and benefits of total hip arthroplasty were presented and reviewed. The risks due to aseptic loosening, infection, stiffness, dislocation/subluxation,  thromboembolic complications and other imponderables were discussed.  The patient acknowledged the explanation, agreed to proceed with the plan and consent was signed. Patient is being admitted for inpatient treatment for surgery, pain control, PT, OT, prophylactic antibiotics, VTE prophylaxis, progressive ambulation and ADL's and discharge planning.The patient is planning to be discharged home with home health services.     [place] P[place] Theola Cuellar   PA-C  10/06/2014, 12:25 PM

## 2014-10-10 ENCOUNTER — Encounter (HOSPITAL_COMMUNITY)
Admission: RE | Admit: 2014-10-10 | Discharge: 2014-10-10 | Disposition: A | Discharge status: Home / Self Care | Payer: BC Managed Care – PPO | Source: Ambulatory Visit | Attending: Orthopedic Surgery | Admitting: Orthopedic Surgery

## 2014-10-10 ENCOUNTER — Encounter (HOSPITAL_COMMUNITY): Payer: Self-pay

## 2014-10-10 DIAGNOSIS — Z01812 Encounter for preprocedural laboratory examination: Secondary | ICD-10-CM | POA: Insufficient documentation

## 2014-10-10 LAB — BASIC METABOLIC PANEL
ANION GAP: 9 (ref 5–15)
BUN: 14 mg/dL (ref 6–23)
CALCIUM: 9.4 mg/dL (ref 8.4–10.5)
CO2: 27 mEq/L (ref 19–32)
Chloride: 100 mEq/L (ref 96–112)
Creatinine, Ser: 0.71 mg/dL (ref 0.50–1.10)
GFR calc Af Amer: >90 mL/min (ref >90)
GLUCOSE: 84 mg/dL (ref 70–99)
POTASSIUM: 4.7 meq/L (ref 3.7–5.3)
SODIUM: 136 meq/L — AB (ref 137–147)

## 2014-10-10 LAB — URINALYSIS, ROUTINE W REFLEX MICROSCOPIC
Bilirubin Urine: NEGATIVE
GLUCOSE, UA: NEGATIVE mg/dL
Hgb urine dipstick: NEGATIVE
Ketones, ur: NEGATIVE mg/dL
LEUKOCYTES UA: NEGATIVE
NITRITE: NEGATIVE
PH: 7 (ref 5.0–8.0)
PROTEIN: NEGATIVE mg/dL
Specific Gravity, Urine: 1.015 (ref 1.005–1.030)
Urobilinogen, UA: 0.2 mg/dL (ref 0.0–1.0)

## 2014-10-10 LAB — SURGICAL PCR SCREEN
MRSA, PCR: NEGATIVE
Staphylococcus aureus: POSITIVE — AB

## 2014-10-10 LAB — CBC
HEMATOCRIT: 39 % (ref 36.0–46.0)
Hemoglobin: 12.6 g/dL (ref 12.0–15.0)
MCH: 30.9 pg (ref 26.0–34.0)
MCHC: 32.3 g/dL (ref 30.0–36.0)
MCV: 95.6 fL (ref 78.0–100.0)
PLATELETS: 273 10*3/uL (ref 150–400)
RBC: 4.08 MIL/uL (ref 3.87–5.11)
RDW: 12.8 % (ref 11.5–15.5)
WBC: 5.9 10*3/uL (ref 4.0–10.5)

## 2014-10-10 LAB — PROTIME-INR
INR: 0.99 (ref 0.00–1.49)
Prothrombin Time: 13.2 seconds (ref 11.6–15.2)

## 2014-10-10 LAB — APTT: APTT: 28 s (ref 24–37)

## 2014-10-10 NOTE — Pre-Procedure Instructions (Signed)
10-10-14 EKG 10-02-14 Epic.

## 2014-10-10 NOTE — Patient Instructions (Addendum)
Veronica Barrett  10/10/2014   Your procedure is scheduled on:   10-17-2014 Tuesday  Enter through [facility] Entrance and follow signs to [facility]. Arrive at   1130     AM..  Call this number if you have problems the morning of surgery: [phone]  Or Presurgical Testing [phone].   For Living Will and/or Health Care Power Attorney Forms: please provide copy for your medical record,may bring AM of surgery(Forms should be already notarized -we do not provide this service).(10-10-14 No information preferred today).  For Cpap use: Bring mask and tubing only.   Do not eat food/ or drink: After Midnight.  Exception: may have clear liquids:up to 6 Hours before arrival. Nothing after: 0800 AM  Clear liquids include soda, tea, black coffee, apple or grape juice, broth.  Take these medicines the morning of surgery with A SIP OF WATER: Acyclovir(if need)   Do not wear jewelry, make-up or nail polish.  Do not wear deodorant, lotions, powders, or perfumes.   Do not shave legs and under arms- 48 hours(2 days) prior to first CHG shower.(Shaving face and neck okay.)  Do not bring valuables to the hospital.(Hospital is not responsible for lost valuables).  Contacts, dentures or removable bridgework, body piercing, hair pins may not be worn into surgery.  Leave suitcase in the car. After surgery it may be brought to your room.  For patients admitted to the hospital, checkout time is 11:00 AM the day of discharge.(Restricted visitors-Any Persons displaying flu-like symptoms or illness).    Patients discharged the day of surgery will not be allowed to drive home. Must have responsible person with you x 24 hours once discharged.  Name and phone number of your driver: Veronica Barrett spouse [phone] home     Please read over the following fact sheets that you were given:  CHG(Chlorhexidine Gluconate 4% Surgical Soap) use, MRSA Information, Blood Transfusion fact sheet, Incentive Spirometry  Instruction.  Remember : Type/Screen "Blue armbands" - may not be removed once applied(would result in being retested AM of surgery, if removed).         [place] - Preparing for Surgery Before surgery, you can play an important role.  Because skin is not sterile, your skin needs to be as free of germs as possible.  You can reduce the number of germs on your skin by washing with CHG (chlorahexidine gluconate) soap before surgery.  CHG is an antiseptic cleaner which kills germs and bonds with the skin to continue killing germs even after washing. Please DO NOT use if you have an allergy to CHG or antibacterial soaps.  If your skin becomes reddened/irritated stop using the CHG and inform your nurse when you arrive at Short Stay. Do not shave (including legs and underarms) for at least 48 hours prior to the first CHG shower.  You may shave your face/neck. Please follow these instructions carefully:  1.  Shower with CHG Soap the night before surgery and the  morning of Surgery.  2.  If you choose to wash your hair, wash your hair first as usual with your  normal  shampoo.  3.  After you shampoo, rinse your hair and body thoroughly to remove the  shampoo.                           4.  Use CHG as you would any other liquid soap.  You can apply chg  directly  to the skin and wash                       Gently with a scrungie or clean washcloth.  5.  Apply the CHG Soap to your body ONLY FROM THE NECK DOWN.   Do not use on face/ open                           Wound or open sores. Avoid contact with eyes, ears mouth and genitals (private parts).                       Wash face,  Genitals (private parts) with your normal soap.             6.  Wash thoroughly, paying special attention to the area where your surgery  will be performed.  7.  Thoroughly rinse your body with warm water from the neck down.  8.  DO NOT shower/wash with your normal soap after using and rinsing off  the CHG Soap.                 9.  Pat yourself dry with a clean towel.            10.  Wear clean pajamas.            11.  Place clean sheets on your bed the night of your first shower and do not  sleep with pets. Day of Surgery : Do not apply any lotions/deodorants the morning of surgery.  Please wear clean clothes to the hospital/surgery center.  FAILURE TO FOLLOW THESE INSTRUCTIONS MAY RESULT IN THE CANCELLATION OF YOUR SURGERY PATIENT SIGNATURE_________________________________  NURSE SIGNATURE__________________________________  ________________________________________________________________________   Veronica Barrett  An incentive spirometer is a tool that can help keep your lungs clear and active. This tool measures how well you are filling your lungs with each breath. Taking long deep breaths may help reverse or decrease the chance of developing breathing (pulmonary) problems (especially infection) following:  A long period of time when you are unable to move or be active. BEFORE THE PROCEDURE   If the spirometer includes an indicator to show your best effort, your nurse or respiratory therapist will set it to a desired goal.  If possible, sit up straight or lean slightly forward. Try not to slouch.  Hold the incentive spirometer in an upright position. INSTRUCTIONS FOR USE   Sit on the edge of your bed if possible, or sit up as far as you can in bed or on a chair.  Hold the incentive spirometer in an upright position.  Breathe out normally.  Place the mouthpiece in your mouth and seal your lips tightly around it.  Breathe in slowly and as deeply as possible, raising the piston or the ball toward the top of the column.  Hold your breath for 3-5 seconds or for as long as possible. Allow the piston or ball to fall to the bottom of the column.  Remove the mouthpiece from your mouth and breathe out normally.  Rest for a few seconds and repeat Steps 1 through 7 at least 10 times every 1-2 hours  when you are awake. Take your time and take a few normal breaths between deep breaths.  The spirometer may include an indicator to show your best effort. Use the indicator as a goal to work toward during each repetition.  After each set of 10 deep breaths, practice coughing to be sure your lungs are clear. If you have an incision (the cut made at the time of surgery), support your incision when coughing by placing a pillow or rolled up towels firmly against it. Once you are able to get out of bed, walk around indoors and cough well. You may stop using the incentive spirometer when instructed by your caregiver.  RISKS AND COMPLICATIONS  Take your time so you do not get dizzy or light-headed.  If you are in pain, you may need to take or ask for pain medication before doing incentive spirometry. It is harder to take a deep breath if you are having pain. AFTER USE  Rest and breathe slowly and easily.  It can be helpful to keep track of a log of your progress. Your caregiver can provide you with a simple table to help with this. If you are using the spirometer at home, follow these instructions: [place] IF:   You are having difficultly using the spirometer.  You have trouble using the spirometer as often as instructed.  Your pain medication is not giving enough relief while using the spirometer.  You develop fever of 100.5 F (38.1 C) or higher. SEEK IMMEDIATE MEDICAL CARE IF:   You cough up bloody sputum that had not been present before.  You develop fever of 102 F (38.9 C) or greater.  You develop worsening pain at or near the incision site. MAKE SURE YOU:   Understand these instructions.  Will watch your condition.  Will get help right away if you are not doing well or get worse. Document Released: 03/16/2007 Document Revised: 01/26/2012 Document Reviewed: 05/17/2007 ExitCare Patient Information 2014 ExitCare,  [place].   ________________________________________________________________________  WHAT IS A BLOOD TRANSFUSION? Blood Transfusion Information  A transfusion is the replacement of blood or some of its parts. Blood is made up of multiple cells which provide different functions.  Red blood cells carry oxygen and are used for blood loss replacement.  White blood cells fight against infection.  Platelets control bleeding.  Plasma helps clot blood.  Other blood products are available for specialized needs, such as hemophilia or other clotting disorders. BEFORE THE TRANSFUSION  Who gives blood for transfusions?   Healthy volunteers who are fully evaluated to make sure their blood is safe. This is blood bank blood. Transfusion therapy is the safest it has ever been in the practice of medicine. Before blood is taken from a donor, a complete history is taken to make sure that person has no history of diseases nor engages in risky social behavior (examples are intravenous drug use or sexual activity with multiple partners). The donor's travel history is screened to minimize risk of transmitting infections, such as malaria. The donated blood is tested for signs of infectious diseases, such as HIV and hepatitis. The blood is then tested to be sure it is compatible with you in order to minimize the chance of a transfusion reaction. If you or a relative donates blood, this is often done in anticipation of surgery and is not appropriate for emergency situations. It takes many days to process the donated blood. RISKS AND COMPLICATIONS Although transfusion therapy is very safe and saves many lives, the main dangers of transfusion include:   Getting an infectious disease.  Developing a transfusion reaction. This is an allergic reaction to something in the blood you were given. Every precaution is taken to prevent this. The decision to  have a blood transfusion has been considered carefully by your caregiver  before blood is given. Blood is not given unless the benefits outweigh the risks. AFTER THE TRANSFUSION  Right after receiving a blood transfusion, you will usually feel much better and more energetic. This is especially true if your red blood cells have gotten low (anemic). The transfusion raises the level of the red blood cells which carry oxygen, and this usually causes an energy increase.  The nurse administering the transfusion will monitor you carefully for complications. HOME CARE INSTRUCTIONS  No special instructions are needed after a transfusion. You may find your energy is better. Speak with your caregiver about any limitations on activity for underlying diseases you may have. SEEK MEDICAL CARE IF:   Your condition is not improving after your transfusion.  You develop redness or irritation at the intravenous (IV) site. SEEK IMMEDIATE MEDICAL CARE IF:  Any of the following symptoms occur over the next 12 hours:  Shaking chills.  You have a temperature by mouth above 102 F (38.9 C), not controlled by medicine.  Chest, back, or muscle pain.  People around you feel you are not acting correctly or are confused.  Shortness of breath or difficulty breathing.  Dizziness and fainting.  You get a rash or develop hives.  You have a decrease in urine output.  Your urine turns a dark color or changes to pink, red, or brown. Any of the following symptoms occur over the next 10 days:  You have a temperature by mouth above 102 F (38.9 C), not controlled by medicine.  Shortness of breath.  Weakness after normal activity.  The white part of the eye turns yellow (jaundice).  You have a decrease in the amount of urine or are urinating less often.  Your urine turns a dark color or changes to pink, red, or brown. Document Released: 10/31/2000 Document Revised: 01/26/2012 Document Reviewed: 06/19/2008 [facility] Patient Information 2014 [place],  [place].  _______________________________________________________________________

## 2014-10-17 ENCOUNTER — Encounter (HOSPITAL_COMMUNITY)
Admission: RE | Disposition: A | Discharge status: Home / Self Care | Payer: Self-pay | Source: Ambulatory Visit | Attending: Orthopedic Surgery

## 2014-10-17 ENCOUNTER — Inpatient Hospital Stay (HOSPITAL_COMMUNITY): Payer: BC Managed Care – PPO

## 2014-10-17 ENCOUNTER — Ambulatory Visit (HOSPITAL_COMMUNITY): Payer: BC Managed Care – PPO

## 2014-10-17 ENCOUNTER — Inpatient Hospital Stay (HOSPITAL_COMMUNITY)
Admission: RE | Admit: 2014-10-17 | Discharge: 2014-10-18 | DRG: 470 | Disposition: A | Discharge status: Home / Self Care | Payer: BC Managed Care – PPO | Source: Ambulatory Visit | Attending: Orthopedic Surgery | Admitting: Orthopedic Surgery

## 2014-10-17 ENCOUNTER — Ambulatory Visit (HOSPITAL_COMMUNITY): Payer: BC Managed Care – PPO | Admitting: Anesthesiology

## 2014-10-17 ENCOUNTER — Encounter (HOSPITAL_COMMUNITY): Payer: Self-pay | Admitting: *Deleted

## 2014-10-17 DIAGNOSIS — M1612 Unilateral primary osteoarthritis, left hip: Principal | ICD-10-CM | POA: Diagnosis present

## 2014-10-17 DIAGNOSIS — M25552 Pain in left hip: Secondary | ICD-10-CM | POA: Diagnosis present

## 2014-10-17 DIAGNOSIS — F329 Major depressive disorder, single episode, unspecified: Secondary | ICD-10-CM | POA: Diagnosis present

## 2014-10-17 DIAGNOSIS — J309 Allergic rhinitis, unspecified: Secondary | ICD-10-CM | POA: Diagnosis present

## 2014-10-17 DIAGNOSIS — Z96649 Presence of unspecified artificial hip joint: Secondary | ICD-10-CM

## 2014-10-17 DIAGNOSIS — B009 Herpesviral infection, unspecified: Secondary | ICD-10-CM | POA: Diagnosis present

## 2014-10-17 DIAGNOSIS — I959 Hypotension, unspecified: Secondary | ICD-10-CM | POA: Diagnosis present

## 2014-10-17 HISTORY — PX: TOTAL HIP ARTHROPLASTY: SHX124

## 2014-10-17 LAB — TYPE AND SCREEN
ABO/RH(D): O POS
Antibody Screen: NEGATIVE

## 2014-10-17 LAB — ABO/RH: ABO/RH(D): O POS

## 2014-10-17 SURGERY — ARTHROPLASTY, HIP, TOTAL, ANTERIOR APPROACH
Anesthesia: Spinal | Site: Hip | Laterality: Left

## 2014-10-17 MED ORDER — TRANEXAMIC ACID 100 MG/ML IV SOLN
1000.0000 mg | Freq: Once | INTRAVENOUS | Status: AC
  Administered 2014-10-17: 1000 mg via INTRAVENOUS
  Filled 2014-10-17: qty 10

## 2014-10-17 MED ORDER — DEXAMETHASONE SODIUM PHOSPHATE 10 MG/ML IJ SOLN
10.0000 mg | Freq: Once | INTRAMUSCULAR | Status: AC
  Administered 2014-10-17: 10 mg via INTRAVENOUS

## 2014-10-17 MED ORDER — ONDANSETRON HCL 4 MG/2ML IJ SOLN: 4.0000 mg | Freq: Once | INTRAMUSCULAR | Status: DC | PRN

## 2014-10-17 MED ORDER — SODIUM CHLORIDE 0.9 % IV SOLN
100.0000 mL/h | INTRAVENOUS | Status: DC
  Administered 2014-10-17: 100 mL/h via INTRAVENOUS
  Filled 2014-10-17 (×4): qty 1000

## 2014-10-17 MED ORDER — LIDOCAINE HCL (CARDIAC) 20 MG/ML IV SOLN
INTRAVENOUS | Status: DC | PRN
  Administered 2014-10-17: 100 mg via INTRAVENOUS

## 2014-10-17 MED ORDER — CEFAZOLIN SODIUM-DEXTROSE 2-3 GM-% IV SOLR
2.0000 g | Freq: Four times a day (QID) | INTRAVENOUS | Status: AC
  Administered 2014-10-17 – 2014-10-18 (×2): 2 g via INTRAVENOUS
  Filled 2014-10-17 (×2): qty 50

## 2014-10-17 MED ORDER — DOCUSATE SODIUM 100 MG PO CAPS
100.0000 mg | ORAL_CAPSULE | Freq: Two times a day (BID) | ORAL | Status: DC
  Administered 2014-10-17 – 2014-10-18 (×2): 100 mg via ORAL

## 2014-10-17 MED ORDER — LIDOCAINE HCL (CARDIAC) 20 MG/ML IV SOLN
INTRAVENOUS | Status: AC
  Filled 2014-10-17: qty 5

## 2014-10-17 MED ORDER — MIDAZOLAM HCL 5 MG/5ML IJ SOLN
INTRAMUSCULAR | Status: DC | PRN
  Administered 2014-10-17: 2 mg via INTRAVENOUS

## 2014-10-17 MED ORDER — MIDAZOLAM HCL 2 MG/2ML IJ SOLN
INTRAMUSCULAR | Status: AC
  Filled 2014-10-17: qty 2

## 2014-10-17 MED ORDER — ACYCLOVIR 200 MG PO CAPS
200.0000 mg | ORAL_CAPSULE | Freq: Every day | ORAL | Status: DC | PRN
  Filled 2014-10-17: qty 1

## 2014-10-17 MED ORDER — ASPIRIN EC 325 MG PO TBEC
325.0000 mg | DELAYED_RELEASE_TABLET | Freq: Two times a day (BID) | ORAL | Status: DC
  Administered 2014-10-18: 325 mg via ORAL
  Filled 2014-10-17 (×3): qty 1

## 2014-10-17 MED ORDER — ONDANSETRON HCL 4 MG/2ML IJ SOLN
INTRAMUSCULAR | Status: AC
  Filled 2014-10-17: qty 2

## 2014-10-17 MED ORDER — HYDROMORPHONE HCL 1 MG/ML IJ SOLN: 0.5000 mg | INTRAMUSCULAR | Status: DC | PRN

## 2014-10-17 MED ORDER — EPHEDRINE SULFATE 50 MG/ML IJ SOLN
INTRAMUSCULAR | Status: DC | PRN
  Administered 2014-10-17 (×5): 5 mg via INTRAVENOUS

## 2014-10-17 MED ORDER — PROPOFOL 10 MG/ML IV BOLUS
INTRAVENOUS | Status: AC
  Filled 2014-10-17: qty 20

## 2014-10-17 MED ORDER — MAGNESIUM CITRATE PO SOLN: 1.0000 | Freq: Once | ORAL | Status: AC | PRN

## 2014-10-17 MED ORDER — ALUM & MAG HYDROXIDE-SIMETH 200-200-20 MG/5ML PO SUSP: 30.0000 mL | ORAL | Status: DC | PRN

## 2014-10-17 MED ORDER — METHOCARBAMOL 1000 MG/10ML IJ SOLN
500.0000 mg | Freq: Four times a day (QID) | INTRAVENOUS | Status: DC | PRN
  Administered 2014-10-18: 500 mg via INTRAVENOUS
  Filled 2014-10-17 (×2): qty 5

## 2014-10-17 MED ORDER — CITALOPRAM HYDROBROMIDE 20 MG PO TABS
30.0000 mg | ORAL_TABLET | Freq: Every day | ORAL | Status: DC
  Administered 2014-10-17: 30 mg via ORAL
  Filled 2014-10-17 (×2): qty 1

## 2014-10-17 MED ORDER — FERROUS SULFATE 325 (65 FE) MG PO TABS
325.0000 mg | ORAL_TABLET | Freq: Three times a day (TID) | ORAL | Status: DC
  Administered 2014-10-18 (×2): 325 mg via ORAL
  Filled 2014-10-17 (×5): qty 1

## 2014-10-17 MED ORDER — FENTANYL CITRATE 0.05 MG/ML IJ SOLN
INTRAMUSCULAR | Status: AC
  Filled 2014-10-17: qty 2

## 2014-10-17 MED ORDER — POLYETHYLENE GLYCOL 3350 17 G PO PACK
17.0000 g | PACK | Freq: Two times a day (BID) | ORAL | Status: DC
  Administered 2014-10-17 – 2014-10-18 (×2): 17 g via ORAL

## 2014-10-17 MED ORDER — DIPHENHYDRAMINE HCL 25 MG PO CAPS
25.0000 mg | ORAL_CAPSULE | Freq: Four times a day (QID) | ORAL | Status: DC | PRN
  Filled 2014-10-17: qty 1

## 2014-10-17 MED ORDER — PHENOL 1.4 % MT LIQD: 1.0000 | OROMUCOSAL | Status: DC | PRN

## 2014-10-17 MED ORDER — MEPERIDINE HCL 50 MG/ML IJ SOLN
12.5000 mg | Freq: Once | INTRAMUSCULAR | Status: AC
  Administered 2014-10-17: 12.5 mg via INTRAVENOUS

## 2014-10-17 MED ORDER — DEXAMETHASONE SODIUM PHOSPHATE 10 MG/ML IJ SOLN
10.0000 mg | Freq: Once | INTRAMUSCULAR | Status: AC
  Administered 2014-10-18: 10 mg via INTRAVENOUS
  Filled 2014-10-17: qty 1

## 2014-10-17 MED ORDER — ONDANSETRON HCL 4 MG/2ML IJ SOLN
INTRAMUSCULAR | Status: DC | PRN
  Administered 2014-10-17: 4 mg via INTRAVENOUS

## 2014-10-17 MED ORDER — METOCLOPRAMIDE HCL 10 MG PO TABS: 5.0000 mg | ORAL_TABLET | Freq: Three times a day (TID) | ORAL | Status: DC | PRN

## 2014-10-17 MED ORDER — METOCLOPRAMIDE HCL 5 MG/ML IJ SOLN: 5.0000 mg | Freq: Three times a day (TID) | INTRAMUSCULAR | Status: DC | PRN

## 2014-10-17 MED ORDER — LORATADINE 10 MG PO TABS
10.0000 mg | ORAL_TABLET | Freq: Every day | ORAL | Status: DC
  Administered 2014-10-17: 10 mg via ORAL
  Filled 2014-10-17 (×2): qty 1

## 2014-10-17 MED ORDER — BISACODYL 10 MG RE SUPP: 10.0000 mg | Freq: Every day | RECTAL | Status: DC | PRN

## 2014-10-17 MED ORDER — LACTATED RINGERS IV SOLN
INTRAVENOUS | Status: DC | PRN
  Administered 2014-10-17: 14:00:00 via INTRAVENOUS

## 2014-10-17 MED ORDER — DEXAMETHASONE SODIUM PHOSPHATE 10 MG/ML IJ SOLN
INTRAMUSCULAR | Status: AC
  Filled 2014-10-17: qty 1

## 2014-10-17 MED ORDER — BUPIVACAINE HCL (PF) 0.5 % IJ SOLN
INTRAMUSCULAR | Status: AC
  Filled 2014-10-17: qty 30

## 2014-10-17 MED ORDER — CHLORHEXIDINE GLUCONATE 4 % EX LIQD: 60.0000 mL | Freq: Once | CUTANEOUS | Status: DC

## 2014-10-17 MED ORDER — CEFAZOLIN SODIUM-DEXTROSE 2-3 GM-% IV SOLR
INTRAVENOUS | Status: AC
  Filled 2014-10-17: qty 50

## 2014-10-17 MED ORDER — ONDANSETRON HCL 4 MG PO TABS: 4.0000 mg | ORAL_TABLET | Freq: Four times a day (QID) | ORAL | Status: DC | PRN

## 2014-10-17 MED ORDER — FENTANYL CITRATE 0.05 MG/ML IJ SOLN: 25.0000 ug | INTRAMUSCULAR | Status: DC | PRN

## 2014-10-17 MED ORDER — CELECOXIB 200 MG PO CAPS
200.0000 mg | ORAL_CAPSULE | Freq: Two times a day (BID) | ORAL | Status: DC
  Administered 2014-10-17 – 2014-10-18 (×2): 200 mg via ORAL
  Filled 2014-10-17 (×3): qty 1

## 2014-10-17 MED ORDER — CEFAZOLIN SODIUM-DEXTROSE 2-3 GM-% IV SOLR
2.0000 g | INTRAVENOUS | Status: AC
  Administered 2014-10-17: 2 g via INTRAVENOUS

## 2014-10-17 MED ORDER — SODIUM CHLORIDE 0.9 % IR SOLN
Status: DC | PRN
  Administered 2014-10-17: 1000 mL

## 2014-10-17 MED ORDER — MEPERIDINE HCL 50 MG/ML IJ SOLN
INTRAMUSCULAR | Status: AC
  Filled 2014-10-17: qty 1

## 2014-10-17 MED ORDER — ONDANSETRON HCL 4 MG/2ML IJ SOLN: 4.0000 mg | Freq: Four times a day (QID) | INTRAMUSCULAR | Status: DC | PRN

## 2014-10-17 MED ORDER — METHOCARBAMOL 500 MG PO TABS
500.0000 mg | ORAL_TABLET | Freq: Four times a day (QID) | ORAL | Status: DC | PRN
  Administered 2014-10-18: 500 mg via ORAL
  Filled 2014-10-17: qty 1

## 2014-10-17 MED ORDER — PROPOFOL INFUSION 10 MG/ML OPTIME
INTRAVENOUS | Status: DC | PRN
  Administered 2014-10-17: 75 ug/kg/min via INTRAVENOUS

## 2014-10-17 MED ORDER — FENTANYL CITRATE 0.05 MG/ML IJ SOLN
INTRAMUSCULAR | Status: DC | PRN
  Administered 2014-10-17: 25 ug via INTRAVENOUS

## 2014-10-17 MED ORDER — HYDROCODONE-ACETAMINOPHEN 7.5-325 MG PO TABS
1.0000 | ORAL_TABLET | ORAL | Status: DC
  Administered 2014-10-17 (×2): 1 via ORAL
  Administered 2014-10-18: 2 via ORAL
  Administered 2014-10-18 (×3): 1 via ORAL
  Filled 2014-10-17: qty 1
  Filled 2014-10-17: qty 2
  Filled 2014-10-17: qty 1
  Filled 2014-10-17: qty 2
  Filled 2014-10-17 (×3): qty 1

## 2014-10-17 MED ORDER — MENTHOL 3 MG MT LOZG: 1.0000 | LOZENGE | OROMUCOSAL | Status: DC | PRN

## 2014-10-17 SURGICAL SUPPLY — 38 items
BAG SPEC THK2 15X12 ZIP CLS (MISCELLANEOUS)
BAG ZIPLOCK 12X15 (MISCELLANEOUS) IMPLANT
CAPT HIP TOTAL 2 ×2 IMPLANT
COVER PERINEAL POST (MISCELLANEOUS) ×3 IMPLANT
DRAPE C-ARM 42X120 X-RAY (DRAPES) ×3 IMPLANT
DRAPE STERI IOBAN 125X83 (DRAPES) ×3 IMPLANT
DRAPE U-SHAPE 47X51 STRL (DRAPES) ×9 IMPLANT
DRSG AQUACEL AG ADV 3.5X10 (GAUZE/BANDAGES/DRESSINGS) ×3 IMPLANT
DURAPREP 26ML APPLICATOR (WOUND CARE) ×3 IMPLANT
ELECT BLADE TIP CTD 4 INCH (ELECTRODE) ×3 IMPLANT
ELECT PENCIL ROCKER SW 15FT (MISCELLANEOUS) IMPLANT
ELECT REM PT RETURN 15FT ADLT (MISCELLANEOUS) IMPLANT
ELECT REM PT RETURN 9FT ADLT (ELECTROSURGICAL) ×3
ELECTRODE REM PT RTRN 9FT ADLT (ELECTROSURGICAL) ×1 IMPLANT
FACESHIELD WRAPAROUND (MASK) ×12 IMPLANT
FACESHIELD WRAPAROUND OR TEAM (MASK) ×4 IMPLANT
GLOVE BIOGEL PI IND STRL 7.5 (GLOVE) ×1 IMPLANT
GLOVE BIOGEL PI IND STRL 8.5 (GLOVE) ×1 IMPLANT
GLOVE BIOGEL PI INDICATOR 7.5 (GLOVE) ×2
GLOVE BIOGEL PI INDICATOR 8.5 (GLOVE) ×2
GLOVE ECLIPSE 8.0 STRL XLNG CF (GLOVE) ×6 IMPLANT
GLOVE ORTHO TXT STRL SZ7.5 (GLOVE) ×3 IMPLANT
GOWN SPEC L3 XXLG W/TWL (GOWN DISPOSABLE) ×3 IMPLANT
GOWN STRL REUS W/TWL LRG LVL3 (GOWN DISPOSABLE) ×3 IMPLANT
HOLDER FOLEY CATH W/STRAP (MISCELLANEOUS) ×3 IMPLANT
KIT BASIN OR (CUSTOM PROCEDURE TRAY) ×3 IMPLANT
LIQUID BAND (GAUZE/BANDAGES/DRESSINGS) ×3 IMPLANT
PACK TOTAL JOINT (CUSTOM PROCEDURE TRAY) ×3 IMPLANT
SAW OSC TIP CART 19.5X105X1.3 (SAW) ×3 IMPLANT
SUT MNCRL AB 4-0 PS2 18 (SUTURE) ×3 IMPLANT
SUT VIC AB 1 CT1 36 (SUTURE) ×9 IMPLANT
SUT VIC AB 2-0 CT1 27 (SUTURE) ×6
SUT VIC AB 2-0 CT1 TAPERPNT 27 (SUTURE) ×2 IMPLANT
SUT VLOC 180 0 24IN GS25 (SUTURE) ×3 IMPLANT
TOWEL OR 17X26 10 PK STRL BLUE (TOWEL DISPOSABLE) ×3 IMPLANT
TOWEL OR NON WOVEN STRL DISP B (DISPOSABLE) IMPLANT
TRAY FOLEY CATH 14FRSI W/METER (CATHETERS) ×3 IMPLANT
WATER STERILE IRR 1500ML POUR (IV SOLUTION) ×3 IMPLANT

## 2014-10-17 NOTE — Anesthesia Procedure Notes (Signed)
Spinal Patient location during procedure: OR Start time: 10/17/2014 2:30 PM End time: 10/17/2014 2:35 PM Staffing Anesthesiologist: Milana Obey Resident/CRNA: Carleene Cooper A Performed by: anesthesiologist and resident/CRNA  Preanesthetic Checklist Completed: patient identified, site marked, surgical consent, pre-op evaluation, timeout performed, IV checked, risks and benefits discussed and monitors and equipment checked Spinal Block Patient position: sitting Prep: Betadine Patient monitoring: heart rate, continuous pulse ox and blood pressure Location: L3-4 Injection technique: single-shot Needle Needle type: Spinocan  Needle gauge: 22 G Needle length: 9 cm Assessment Sensory level: T8 Additional Notes Expiration date of kit checked and confirmed. Patient tolerated procedure well, without complications. First attempts by CRNA and no CSF return achieved. Os encountered. Switched to Spinocan from [place] and and CSF return with no paresthesias encounterd.

## 2014-10-17 NOTE — Op Note (Signed)
NAME:  Veronica Barrett                ACCOUNT NO.: [id number]      MEDICAL RECORD NO.: [id number]      FACILITY:  [facility]      PHYSICIAN:  Paralee Cancel D  DATE OF BIRTH:  01-01-1951     DATE OF PROCEDURE:  10/17/2014                                 OPERATIVE REPORT         PREOPERATIVE DIAGNOSIS: Left  hip osteoarthritis.      POSTOPERATIVE DIAGNOSIS:  Left hip osteoarthritis.      PROCEDURE:  Left total hip replacement through an anterior approach   utilizing DePuy THR system, component size 75mm pinnacle cup, a size 32+4 neutral   Altrex liner, a size 7 Hi Tri Lock stem with a 32+1 delta ceramic   ball.      SURGEON:  Pietro Cassis. Alvan Dame, M.D.      ASSISTANT:  Danae Orleans, PA-C      ANESTHESIA:  Spinal.      SPECIMENS:  None.      COMPLICATIONS:  None.      BLOOD LOSS:  250 cc     DRAINS:  None.      INDICATION OF THE PROCEDURE:  Veronica Barrett is a 63 y.o. female who had   presented to office for evaluation of left hip pain.  Radiographs revealed   progressive degenerative changes with bone-on-bone   articulation to the  hip joint.  The patient had painful limited range of   motion significantly affecting their overall quality of life.  The patient was failing to    respond to conservative measures, and at this point was ready   to proceed with more definitive measures.  The patient has noted progressive   degenerative changes in his hip, progressive problems and dysfunction   with regarding the hip prior to surgery.  Consent was obtained for   benefit of pain relief.  Specific risk of infection, DVT, component   failure, dislocation, need for revision surgery, as well discussion of   the anterior versus posterior approach were reviewed.  Consent was   obtained for benefit of anterior pain relief through an anterior   approach.      PROCEDURE IN DETAIL:  The patient was brought to operative theater.   Once adequate anesthesia, preoperative  antibiotics, 2gm of Ancef administered.   The patient was positioned supine on the OSI Hanna table.  Once adequate   padding of boney process was carried out, we had predraped out the hip, and  used fluoroscopy to confirm orientation of the pelvis and position.      The left hip was then prepped and draped from proximal iliac crest to   mid thigh with shower curtain technique.      Time-out was performed identifying the patient, planned procedure, and   extremity.     An incision was then made 2 cm distal and lateral to the   anterior superior iliac spine extending over the orientation of the   tensor fascia lata muscle and sharp dissection was carried down to the   fascia of the muscle and protractor placed in the soft tissues.      The fascia was then incised.  The muscle belly was identified and  swept   laterally and retractor placed along the superior neck.  Following   cauterization of the circumflex vessels and removing some pericapsular   fat, a second cobra retractor was placed on the inferior neck.  A third   retractor was placed on the anterior acetabulum after elevating the   anterior rectus.  A L-capsulotomy was along the line of the   superior neck to the trochanteric fossa, then extended proximally and   distally.  Tag sutures were placed and the retractors were then placed   intracapsular.  We then identified the trochanteric fossa and   orientation of my neck cut, confirmed this radiographically   and then made a neck osteotomy with the femur on traction.  The femoral   head was removed without difficulty or complication.  Traction was let   off and retractors were placed posterior and anterior around the   acetabulum.      The labrum and foveal tissue were debrided.  I began reaming with a 73mm   reamer and reamed up to 38mm reamer with good bony bed preparation and a 61mm   cup was chosen.  The final 54mm Pinnacle cup was then impacted under fluoroscopy  to confirm  the depth of penetration and orientation with respect to   abduction.  A screw was placed followed by the hole eliminator.  The final   32+4 neutral Altrex liner was impacted with good visualized rim fit.  The cup was positioned anatomically within the acetabular portion of the pelvis.      At this point, the femur was rolled at 80 degrees.  Further capsule was   released off the inferior aspect of the femoral neck.  I then   released the superior capsule proximally.  The hook was placed laterally   along the femur and elevated manually and held in position with the bed   hook.  The leg was then extended and adducted with the leg rolled to 100   degrees of external rotation.  Once the proximal femur was fully   exposed, I used a box osteotome to set orientation.  I then began   broaching with the starting chili pepper broach and passed this by hand and then broached up to 7.  With the 7 broach in place I chose a high offset neck and did a trial reduction.  The offset was appropriate, leg lengths   appeared to be equal, confirmed radiographically.   Given these findings, I went ahead and dislocated the hip, repositioned all   retractors and positioned the right hip in the extended and abducted position.  The final 7 hie Tri Lock stem was   chosen and it was impacted down to the level of neck cut.  Based on this   and the trial reduction, a 32+1 delta ceramic ball was chosen and   impacted onto a clean and dry trunnion, and the hip was reduced.  The   hip had been irrigated throughout the case again at this point.  I did   reapproximate the superior capsular leaflet to the anterior leaflet   using #1 Vicryl.  The fascia of the   tensor fascia lata muscle was then reapproximated using #1 Vicryl #0 V-lock sutures.  The   remaining wound was closed with 2-0 Vicryl and running 4-0 Monocryl.   The hip was cleaned, dried, and dressed sterilely using Dermabond and   Aquacel dressing.  She was then  brought   to  recovery room in stable condition tolerating the procedure well.    Danae Orleans, PA-C was present for the entirety of the case involved from   preoperative positioning, perioperative retractor management, general   facilitation of the case, as well as primary wound closure as assistant.            Pietro Cassis Alvan Dame, M.D.        10/17/2014 4:01 PM

## 2014-10-17 NOTE — Interval H&P Note (Signed)
History and Physical Interval Note:  10/17/2014 1:29 PM  Veronica Barrett  has presented today for surgery, with the diagnosis of LEFT HIP OA  The various methods of treatment have been discussed with the patient and family. After consideration of risks, benefits and other options for treatment, the patient has consented to  Procedure(s): LEFT TOTAL HIP ARTHROPLASTY ANTERIOR APPROACH (Left) as a surgical intervention .  The patient's history has been reviewed, patient examined, no change in status, stable for surgery.  I have reviewed the patient's chart and labs.  Questions were answered to the patient's satisfaction.     Mauri Pole

## 2014-10-17 NOTE — Transfer of Care (Signed)
Immediate Anesthesia Transfer of Care Note  Patient: Veronica Barrett  Procedure(s) Performed: Procedure(s) (LRB): LEFT TOTAL HIP ARTHROPLASTY ANTERIOR APPROACH (Left)  Patient Location: PACU  Anesthesia Type: Spinal  Level of Consciousness: sedated, patient cooperative and responds to stimulation  Airway & Oxygen Therapy: Patient Spontanous Breathing and Patient connected to face mask oxgen  Post-op Assessment: Report given to PACU RN and Post -op Vital signs reviewed and stable  Post vital signs: Reviewed and stable  Complications: No apparent anesthesia complications

## 2014-10-17 NOTE — Anesthesia Preprocedure Evaluation (Addendum)
Anesthesia Evaluation  Patient identified by MRN, date of birth, ID band Patient awake    Reviewed: Allergy & Precautions, H&P , NPO status , Patient's Chart, lab work & pertinent test results  History of Anesthesia Complications (+) AWARENESS UNDER ANESTHESIA  Airway Mallampati: III  TM Distance: >3 FB Neck ROM: Full  Mouth opening: Limited Mouth Opening  Dental no notable dental hx. (+) Dental Advisory Given, Teeth Intact   Pulmonary neg pulmonary ROS,  breath sounds clear to auscultation  Pulmonary exam normal       Cardiovascular Exercise Tolerance: Good negative cardio ROS  Rhythm:Regular Rate:Normal     Neuro/Psych PSYCHIATRIC DISORDERS Anxiety Depression negative neurological ROS     GI/Hepatic negative GI ROS, Neg liver ROS,   Endo/Other  negative endocrine ROS  Renal/GU negative Renal ROS  negative genitourinary   Musculoskeletal  (+) Arthritis -, Osteoarthritis,    Abdominal   Peds negative pediatric ROS (+)  Hematology negative hematology ROS (+)   Anesthesia Other Findings   Reproductive/Obstetrics negative OB ROS                            Anesthesia Physical Anesthesia Plan  ASA: II  Anesthesia Plan: Spinal   Post-op Pain Management:    Induction: Intravenous  Airway Management Planned: Simple Face Mask  Additional Equipment:   Intra-op Plan:   Post-operative Plan: Extubation in OR  Informed Consent: I have reviewed the patients History and Physical, chart, labs and discussed the procedure including the risks, benefits and alternatives for the proposed anesthesia with the patient or authorized representative who has indicated his/her understanding and acceptance.   Dental advisory given  Plan Discussed with: CRNA  Anesthesia Plan Comments:       Anesthesia Quick Evaluation

## 2014-10-18 LAB — BASIC METABOLIC PANEL
ANION GAP: 9 (ref 5–15)
BUN: 9 mg/dL (ref 6–23)
CALCIUM: 8.5 mg/dL (ref 8.4–10.5)
CO2: 24 mEq/L (ref 19–32)
CREATININE: 0.61 mg/dL (ref 0.50–1.10)
Chloride: 104 mEq/L (ref 96–112)
GFR calc Af Amer: >90 mL/min (ref >90)
Glucose, Bld: 145 mg/dL — ABNORMAL HIGH (ref 70–99)
Potassium: 4.5 mEq/L (ref 3.7–5.3)
Sodium: 137 mEq/L (ref 137–147)

## 2014-10-18 LAB — CBC
HCT: 31.1 % — ABNORMAL LOW (ref 36.0–46.0)
Hemoglobin: 10.2 g/dL — ABNORMAL LOW (ref 12.0–15.0)
MCH: 30.8 pg (ref 26.0–34.0)
MCHC: 32.8 g/dL (ref 30.0–36.0)
MCV: 94 fL (ref 78.0–100.0)
PLATELETS: 234 10*3/uL (ref 150–400)
RBC: 3.31 MIL/uL — ABNORMAL LOW (ref 3.87–5.11)
RDW: 12.7 % (ref 11.5–15.5)
WBC: 9.2 10*3/uL (ref 4.0–10.5)

## 2014-10-18 MED ORDER — ASPIRIN 325 MG PO TBEC: 325.0000 mg | DELAYED_RELEASE_TABLET | Freq: Two times a day (BID) | ORAL | Status: AC

## 2014-10-18 MED ORDER — METHOCARBAMOL 500 MG PO TABS: 500.0000 mg | ORAL_TABLET | Freq: Four times a day (QID) | ORAL | Status: DC | PRN

## 2014-10-18 MED ORDER — HYDROCODONE-ACETAMINOPHEN 7.5-325 MG PO TABS: 1.0000 | ORAL_TABLET | ORAL | Status: DC | PRN

## 2014-10-18 MED ORDER — DSS 100 MG PO CAPS: 100.0000 mg | ORAL_CAPSULE | Freq: Two times a day (BID) | ORAL | Status: DC

## 2014-10-18 MED ORDER — FERROUS SULFATE 325 (65 FE) MG PO TABS: 325.0000 mg | ORAL_TABLET | Freq: Three times a day (TID) | ORAL | Status: DC

## 2014-10-18 MED ORDER — SODIUM CHLORIDE 0.9 % IV BOLUS (SEPSIS)
500.0000 mL | Freq: Once | INTRAVENOUS | Status: AC
  Administered 2014-10-18: 500 mL via INTRAVENOUS

## 2014-10-18 MED ORDER — POLYETHYLENE GLYCOL 3350 17 G PO PACK: 17.0000 g | PACK | Freq: Two times a day (BID) | ORAL | Status: DC

## 2014-10-18 NOTE — Progress Notes (Signed)
Physical Therapy Treatment Patient Details Name: Veronica Barrett MRN: [id number] DOB: [birth year] Today's Date: 10/18/2014    History of Present Illness L THR    PT Comments    Progress with mobility.  Pt reports dizziness with initial standing but minimally with ambulation maintaining BP 82/49 (pt states this is not typical but not uncommon for her).  Follow Up Recommendations  Home health PT     Equipment Recommendations  None recommended by PT    Recommendations for Other Services OT consult     Precautions / Restrictions Precautions Precautions: Fall Precaution Comments: orthostatic on eval Restrictions Weight Bearing Restrictions: No Other Position/Activity Restrictions: WBAT    Mobility  Bed Mobility Overal bed mobility: Needs Assistance Bed Mobility: Supine to Sit     Supine to sit: Min assist;Mod assist     General bed mobility comments: cues for sequence and use of R LE to self assist  Transfers Overall transfer level: Needs assistance Equipment used: Rolling walker (2 wheeled) Transfers: Sit to/from Stand Sit to Stand: Min assist         General transfer comment: cues for LE management and use of UEs to self assist  Ambulation/Gait Ambulation/Gait assistance: Min assist Ambulation Distance (Feet): 222 Feet Assistive device: Rolling walker (2 wheeled) Gait Pattern/deviations: Step-to pattern;Step-through pattern;Decreased step length - right;Decreased step length - left;Shuffle;Trunk flexed Gait velocity: decr   General Gait Details: cues for sequence, posture and position from [employer]            Wheelchair Mobility    Modified Rankin (Stroke Patients Only)       Balance                                    Cognition Arousal/Alertness: Awake/alert Behavior During Therapy: WFL for tasks assessed/performed Overall Cognitive Status: Within Functional Limits for tasks assessed                      Exercises  Total Joint Exercises Ankle Circles/Pumps: AROM;Both;15 reps;Supine Quad Sets: AROM;Both;10 reps;Supine Heel Slides: AAROM;Left;15 reps;Supine Hip ABduction/ADduction: AROM;Left;10 reps;Supine    General Comments        Pertinent Vitals/Pain Pain Assessment: 0-10 Pain Score: 4  Pain Location: L hip Pain Descriptors / Indicators: Aching;Sore Pain Intervention(s): Limited activity within patient's tolerance;Monitored during session;Premedicated before session;Ice applied    Home Living Family/patient expects to be discharged to:: Private residence Living Arrangements: Spouse/significant other Available Help at Discharge: Family Type of Home: House Home Access: Stairs to enter Entrance Stairs-Rails: None Home Layout: Able to live on main level with bedroom/bathroom        Prior Function Level of Independence: Independent          PT Goals (current goals can now be found in the care plan section) Acute Rehab PT Goals Patient Stated Goal: Resume previous lifestyle with decreased pain PT Goal Formulation: With patient Time For Goal Achievement: 10/25/14 Potential to Achieve Goals: Good Progress towards PT goals: Progressing toward goals    Frequency  7X/week    PT Plan Current plan remains appropriate    Co-evaluation             End of Session Equipment Utilized During Treatment: Gait belt Activity Tolerance: Patient tolerated treatment well;Other (comment) Patient left: in chair;with call bell/phone within reach     Time: 1122-1150 PT Time Calculation (min) (ACUTE ONLY):  28 min  Charges:  $Gait Training: 23-37 mins $Therapeutic Exercise: 8-22 mins                    G Codes:      Marin Wisner Oct 19, 2014, 12:27 PM

## 2014-10-18 NOTE — Progress Notes (Signed)
     Subjective: 1 Day Post-Op Procedure(s) (LRB): LEFT TOTAL HIP ARTHROPLASTY ANTERIOR APPROACH (Left)   Patient reports pain as mild, pain controlled. Some hypotensive episodes this morning and with PT. She states that she usually runs low, and may times is lightheaded when standing. Otherwise no events throughout the night. Ready to be discharged home if she does well with PT and hypotensive episodes resolve.   Objective:   VITALS:   Filed Vitals:   10/18/14  BP: 94/47  Pulse: 52  Temp: 98.4 F (36.9 C)   Resp:     Dorsiflexion/Plantar flexion intact Incision: dressing C/D/I No cellulitis present Compartment soft  LABS  Recent Labs  10/18/14 0413  HGB 10.2*  HCT 31.1*  WBC 9.2  PLT 234     Recent Labs  10/18/14 0413  NA 137  K 4.5  BUN 9  CREATININE 0.61  GLUCOSE 145*     Assessment/Plan: 1 Day Post-Op Procedure(s) (LRB): LEFT TOTAL HIP ARTHROPLASTY ANTERIOR APPROACH (Left) 500cc NS bolus Foley cath d/c'ed Advance diet Up with therapy D/C IV fluids Discharge home with home health  Follow up in 2 weeks at [facility]. Follow up with OLIN,Jasminne Mealy D in 2 weeks.  Contact information:  [facility] [address], [address] [place]   PAC  10/18/2014, 10:04 AM

## 2014-10-18 NOTE — Anesthesia Postprocedure Evaluation (Signed)
  Anesthesia Post-op Note  Patient: Veronica Barrett  Procedure(s) Performed: Procedure(s) (LRB): LEFT TOTAL HIP ARTHROPLASTY ANTERIOR APPROACH (Left)  Patient Location: PACU  Anesthesia Type: Spinal  Level of Consciousness: awake and alert   Airway and Oxygen Therapy: Patient Spontanous Breathing  Post-op Pain: mild  Post-op Assessment: Post-op Vital signs reviewed, Patient's Cardiovascular Status Stable, Respiratory Function Stable, Patent Airway and No signs of Nausea or vomiting  Last Vitals:  Filed Vitals:   10/18/14 0608  BP:   Pulse:   Temp: 36.9 C  Resp:     Post-op Vital Signs: stable   Complications: No apparent anesthesia complications

## 2014-10-18 NOTE — Plan of Care (Signed)
Problem: Phase II Progression Outcomes Goal: Ambulates Outcome: Completed/Met Date Met:  10/18/14 Goal: Tolerating diet Outcome: Completed/Met Date Met:  10/18/14 Goal: Discharge plan established Outcome: Completed/Met Date Met:  10/18/14 Goal: Other Phase II Outcomes/Goals Outcome: Completed/Met Date Met:  10/18/14  Problem: Phase III Progression Outcomes Goal: Pain controlled on oral analgesia Outcome: Completed/Met Date Met:  10/18/14 Goal: Ambulates Outcome: Completed/Met Date Met:  10/18/14 Goal: Incision clean - minimal/no drainage Outcome: Completed/Met Date Met:  10/18/14 Goal: Discharge plan remains appropriate-arrangements made Outcome: Completed/Met Date Met:  10/18/14 Goal: Anticoagulant follow-up in place Outcome: Completed/Met Date Met:  10/18/14 Goal: Other Phase III Outcomes/Goals Outcome: Completed/Met Date Met:  10/18/14  Problem: Discharge Progression Outcomes Goal: Barriers To Progression Addressed/Resolved Outcome: Completed/Met Date Met:  10/18/14 Goal: CMS/Neurovascular status at or above baseline Outcome: Completed/Met Date Met:  10/18/14 Goal: Anticoagulant follow-up in place Outcome: Completed/Met Date Met:  10/18/14 Goal: Pain controlled with appropriate interventions Outcome: Completed/Met Date Met:  10/18/14 Goal: Hemodynamically stable Outcome: Completed/Met Date Met:  65/78/46 Goal: Complications resolved/controlled Outcome: Completed/Met Date Met:  10/18/14 Goal: Tolerates diet Outcome: Completed/Met Date Met:  10/18/14 Goal: Activity appropriate for discharge plan Outcome: Completed/Met Date Met:  10/18/14 Goal: Ambulates safely using assistive device Outcome: Completed/Met Date Met:  10/18/14 Goal: Follows weight - bearing limitations Outcome: Completed/Met Date Met:  10/18/14 Goal: Discharge plan in place and appropriate Outcome: Completed/Met Date Met:  10/18/14 Goal: Negotiates stairs Outcome: Completed/Met Date Met:   10/18/14 Goal: Demonstrates ADLs as appropriate Outcome: Completed/Met Date Met:  10/18/14 Goal: Incision without S/S infection Outcome: Completed/Met Date Met:  10/18/14 Goal: Other Discharge Outcomes/Goals Outcome: Completed/Met Date Met:  10/18/14

## 2014-10-18 NOTE — Progress Notes (Signed)
Patient discharged to home.  Reviewed discharge instructions with patient, patient verbalized understanding.  Prescriptions given to patient.  Patient escorted off floor via wheelchair with NT.  Christen Bame RN

## 2014-10-18 NOTE — Plan of Care (Signed)
Problem: Phase I Progression Outcomes Goal: CMS/Neurovascular status WDL Outcome: Completed/Met Date Met:  10/18/14 Goal: Pain controlled with appropriate interventions Outcome: Completed/Met Date Met:  10/18/14 Goal: Dangle or out of bed evening of surgery Outcome: Completed/Met Date Met:  10/18/14 Goal: Initial discharge plan identified Outcome: Completed/Met Date Met:  10/18/14 Goal: Hemodynamically stable Outcome: Completed/Met Date Met:  10/18/14 Goal: Other Phase I Outcomes/Goals Outcome: Completed/Met Date Met:  10/18/14

## 2014-10-18 NOTE — Progress Notes (Signed)
Physical Therapy Evaluation Patient Details Name: Veronica Barrett MRN: [id number] DOB: 1951-05-11 Today's Date: 10/18/2014   History of Present Illness  L THR  Clinical Impression  Pt s/p L THR presents with decreased L LE strength/ROM, low BP and post op pain limiting functional mobility.  Pt should progress to d/c home with family assist and HHPT follow up.    Follow Up Recommendations Home health PT    Equipment Recommendations  None recommended by PT    Recommendations for Other Services OT consult     Precautions / Restrictions Precautions Precautions: Fall Precaution Comments: orthostatic on eval Restrictions Weight Bearing Restrictions: No Other Position/Activity Restrictions: WBAT      Mobility  Bed Mobility Overal bed mobility: Needs Assistance Bed Mobility: Supine to Sit     Supine to sit: Min assist;Mod assist     General bed mobility comments: cues for sequence and use of R LE to self assist  Transfers Overall transfer level: Needs assistance Equipment used: Rolling walker (2 wheeled) Transfers: Sit to/from Stand Sit to Stand: Min assist         General transfer comment: cues for LE management and use of UEs to self assist  Ambulation/Gait Ambulation/Gait assistance: Min assist;+2 safety/equipment Ambulation Distance (Feet): 5 Feet Assistive device: Rolling walker (2 wheeled) Gait Pattern/deviations: Step-to pattern;Decreased step length - right;Decreased step length - left;Shuffle;Trunk flexed Gait velocity: decr   General Gait Details: cues for sequence, posture and position from [employer]            Wheelchair Mobility    Modified Rankin (Stroke Patients Only)       Balance                                             Pertinent Vitals/Pain Pain Assessment: 0-10 Pain Score: 5  Pain Location: L hip Pain Descriptors / Indicators: Aching;Sore Pain Intervention(s): Limited activity within patient's  tolerance;Monitored during session;Premedicated before session;Ice applied    Home Living Family/patient expects to be discharged to:: Private residence Living Arrangements: Spouse/significant other Available Help at Discharge: Family Type of Home: House Home Access: Stairs to enter Entrance Stairs-Rails: None Entrance Stairs-Number of Steps: 3 Home Layout: Able to live on main level with bedroom/bathroom        Prior Function Level of Independence: Independent               Hand Dominance   Dominant Hand: Right    Extremity/Trunk Assessment   Upper Extremity Assessment: Overall WFL for tasks assessed           Lower Extremity Assessment: LLE deficits/detail   LLE Deficits / Details: 2/5 hip strength with AAROM at hip to 75 flex and 15 abd  Cervical / Trunk Assessment: Normal  Communication   Communication: No difficulties  Cognition Arousal/Alertness: Awake/alert Behavior During Therapy: WFL for tasks assessed/performed Overall Cognitive Status: Within Functional Limits for tasks assessed                      General Comments      Exercises Total Joint Exercises Ankle Circles/Pumps: AROM;Both;15 reps;Supine Quad Sets: AROM;Both;10 reps;Supine Heel Slides: AAROM;Left;15 reps;Supine Hip ABduction/ADduction: AROM;Left;10 reps;Supine      Assessment/Plan    PT Assessment Patient needs continued PT services  PT Diagnosis Difficulty walking   PT Problem List Decreased strength;Decreased range  of motion;Decreased activity tolerance;Decreased mobility;Decreased knowledge of use of DME;Pain  PT Treatment Interventions DME instruction;Gait training;Stair training;Functional mobility training;Therapeutic activities;Therapeutic exercise;Patient/family education   PT Goals (Current goals can be found in the Care Plan section) Acute Rehab PT Goals Patient Stated Goal: Resume previous lifestyle with decreased pain PT Goal Formulation: With patient Time  For Goal Achievement: 10/25/14 Potential to Achieve Goals: Good    Frequency 7X/week   Barriers to discharge        Co-evaluation               End of Session Equipment Utilized During Treatment: Gait belt Activity Tolerance: Patient tolerated treatment well;Other (comment) (BP low) Patient left: in chair;with call bell/phone within reach Nurse Communication: Mobility status         Time: 6770-3403 PT Time Calculation (min) (ACUTE ONLY): 44 min   Charges:   PT Evaluation $Initial PT Evaluation Tier I: 1 Procedure PT Treatments $Gait Training: 8-22 mins $Therapeutic Exercise: 8-22 mins   PT G Codes:          Tayveon Lombardo 10/18/2014, 10:55 AM

## 2014-10-18 NOTE — Progress Notes (Signed)
Physical Therapy Treatment Patient Details Name: Veronica Barrett MRN: [id number] DOB: 11-03-1951 Today's Date: 10/18/2014    History of Present Illness L THR-direct    PT Comments    Progressing with mobility with mild dizziness with move to standing only.  Reviewed car transfers, stairs and bed mobility.   Follow Up Recommendations  Home health PT     Equipment Recommendations  None recommended by PT    Recommendations for Other Services OT consult     Precautions / Restrictions Precautions Precautions: Fall Precaution Comments: monitor BP Restrictions Weight Bearing Restrictions: No Other Position/Activity Restrictions: WBAT    Mobility  Bed Mobility Overal bed mobility: Needs Assistance Bed Mobility: Sit to Supine       Sit to supine: Min assist   General bed mobility comments: cues for sequence and use of R LE to self assist  Transfers Overall transfer level: Needs assistance Equipment used: Rolling walker (2 wheeled) Transfers: Sit to/from Stand Sit to Stand: Supervision         General transfer comment: min verbal cues hand placement and LE management.  Ambulation/Gait Ambulation/Gait assistance: Min guard;Supervision Ambulation Distance (Feet): 200 Feet Assistive device: Rolling walker (2 wheeled) Gait Pattern/deviations: Step-to pattern;Step-through pattern;Decreased step length - right;Decreased step length - left;Shuffle Gait velocity: decr   General Gait Details: cues for sequence, posture and position from RW   Stairs Stairs: Yes Stairs assistance: Min assist Stair Management: No rails;Step to pattern;Backwards;With walker Number of Stairs: 4 General stair comments: cues for sequence and foot/RW placement  Wheelchair Mobility    Modified Rankin (Stroke Patients Only)       Balance                                    Cognition Arousal/Alertness: Awake/alert Behavior During Therapy: WFL for tasks  assessed/performed Overall Cognitive Status: Within Functional Limits for tasks assessed                      Exercises      General Comments        Pertinent Vitals/Pain Pain Assessment: 0-10 Pain Score: 3  Pain Location: L hip Pain Descriptors / Indicators: Sore Pain Intervention(s): Limited activity within patient's tolerance;Monitored during session;Ice applied;Premedicated before session    Home Living Family/patient expects to be discharged to:: Private residence Living Arrangements: Spouse/significant other Available Help at Discharge: Family Type of Home: House Home Access: Stairs to enter Entrance Stairs-Rails: None Home Layout: Able to live on main level with bedroom/bathroom Home Equipment: Shower seat      Prior Function Level of Independence: Independent          PT Goals (current goals can now be found in the care plan section) Acute Rehab PT Goals Patient Stated Goal: return to work PT Goal Formulation: With patient Time For Goal Achievement: 10/25/14 Potential to Achieve Goals: Good Progress towards PT goals: Progressing toward goals    Frequency  7X/week    PT Plan Current plan remains appropriate    Co-evaluation             End of Session Equipment Utilized During Treatment: Gait belt Activity Tolerance: Patient tolerated treatment well Patient left: in bed;with call bell/phone within reach     Time: 1446-1517 PT Time Calculation (min) (ACUTE ONLY): 31 min  Charges:  $Gait Training: 8-22 mins $Therapeutic Activity: 8-22 mins  G Codes:      Raistlin Gum Oct 19, 2014, 3:45 PM

## 2014-10-18 NOTE — Evaluation (Signed)
Occupational Therapy Evaluation Patient Details Name: Veronica Barrett MRN: [id number] DOB: [birth year] Today's Date: 10/18/2014    History of Present Illness L THR-direct   Clinical Impression   Pt does report feeling a little dizzy with initially standing. Cautioned pt to go slowly with position changes. She was able to tolerate practicing toilet transfer and grooming. Educated also on AE for LB self care versus family assisting. Will follow while on acute.    Follow Up Recommendations  No OT follow up;Supervision/Assistance - 24 hour    Equipment Recommendations  None recommended by OT    Recommendations for Other Services       Precautions / Restrictions Precautions Precautions: Fall Precaution Comments: monitor BP Restrictions Weight Bearing Restrictions: No Other Position/Activity Restrictions: WBAT      Mobility Bed Mobility                  Transfers Overall transfer level: Needs assistance Equipment used: Rolling walker (2 wheeled) Transfers: Sit to/from Stand Sit to Stand: Min guard         General transfer comment: min verbal cues hand placement and LE management.    Balance                                            ADL Overall ADL's : Needs assistance/impaired Eating/Feeding: Independent;Sitting   Grooming: Wash/dry hands;Min guard;Standing   Upper Body Bathing: Set up;Sitting   Lower Body Bathing: Minimal assistance;Sit to/from stand   Upper Body Dressing : Set up;Sitting   Lower Body Dressing: Moderate assistance;Sit to/from stand   Toilet Transfer: Min guard;Ambulation;Comfort height toilet;BSC;RW   Toileting- Water quality scientist and Hygiene: Min guard;Sit to/from stand         General ADL Comments: Educated pt on options for tub transfer including having seat face out of shower and sit back on seat from outside of tub, using the walker to first back up to tub. Explained that pt can progress to stepping over  tub.  Also educated pt on AE options for LB self care and pt states she may purchase AE but also has assist. Pt does get dizzy with initially standing up so educated pt on slowly rising and standing for a bit before transferring away from her surfaces. BP sitting in chair after toileting 91/59. Nursing made aware. Discussed sequence for LB dressing. Pt has a higher commode with vanity beside and she declines getting a 3in1. She did fairly well with rising and descending on higher toilet with bar with some effort for the standing up. She states her husband can be right there with her to help as needed.     Vision                     Perception     Praxis      Pertinent Vitals/Pain Pain Assessment: 0-10 Pain Score: 2  Pain Location: L hip Pain Descriptors / Indicators: Sore Pain Intervention(s): Repositioned;Ice applied     Hand Dominance Right   Extremity/Trunk Assessment Upper Extremity Assessment Upper Extremity Assessment: Overall WFL for tasks assessed           Communication Communication Communication: No difficulties   Cognition Arousal/Alertness: Awake/alert Behavior During Therapy: WFL for tasks assessed/performed Overall Cognitive Status: Within Functional Limits for tasks assessed  General Comments       Exercises       Shoulder Instructions      Home Living Family/patient expects to be discharged to:: Private residence Living Arrangements: Spouse/significant other Available Help at Discharge: Family Type of Home: House Home Access: Stairs to enter Technical brewer of Steps: 3 Entrance Stairs-Rails: None Home Layout: Able to live on main level with bedroom/bathroom     Bathroom Shower/Tub: Teacher, early years/pre: Handicapped height     Home Equipment: Shower seat          Prior Functioning/Environment Level of Independence: Independent             OT Diagnosis: Generalized weakness    OT Problem List: Decreased strength;Decreased knowledge of use of DME or AE   OT Treatment/Interventions: Self-care/ADL training;Patient/family education;Therapeutic activities;DME and/or AE instruction    OT Goals(Current goals can be found in the care plan section) Acute Rehab OT Goals Patient Stated Goal: return to work OT Goal Formulation: With patient Time For Goal Achievement: 10/25/14 Potential to Achieve Goals: Good  OT Frequency: Min 2X/week   Barriers to D/C:            Co-evaluation              End of Session Equipment Utilized During Treatment: Rolling walker  Activity Tolerance: Patient tolerated treatment well Patient left: in chair;with call bell/phone within reach   Time: 1335-1400 OT Time Calculation (min): 25 min Charges:  OT General Charges $OT Visit: 1 Procedure OT Evaluation $Initial OT Evaluation Tier I: 1 Procedure OT Treatments $Therapeutic Activity: 8-22 mins G-Codes:    Jules Schick  [phone] 10/18/2014, 2:15 PM

## 2014-10-18 NOTE — Care Management Note (Signed)
    Page 1 of 2   10/18/2014     11:05:36 AM CARE MANAGEMENT NOTE 10/18/2014  Patient:  Veronica Barrett, Veronica Barrett   Account Number:  [id number]  Date Initiated:  10/18/2014  Documentation initiated by:  [facility]  Subjective/Objective Assessment:   adm: LEFT TOTAL HIP ARTHROPLASTY ANTERIOR APPROACH (Left)     Action/Plan:   discharge planning   Anticipated DC Date:  10/18/2014   Anticipated DC Plan:  [place]  CM consult      [facility] Choice  HOME HEALTH   Choice offered to / List presented to:  C-1 Patient   DME arranged  NA      DME agency  NA     [place] arranged  HH-2 PT      [place]   Status of service:  Completed, signed off Medicare Important Message given?   (If response is "NO", the following Medicare IM given date fields will be blank) Date Medicare IM given:   Medicare IM given by:   Date Additional Medicare IM given:   Additional Medicare IM given by:    Discharge Disposition:  [place]  Per UR Regulation:  Reviewed for med. necessity/level of care/duration of stay  If discussed at [place] of Stay Meetings, dates discussed:    Comments:  10/18/14 07:45 CM met with pt in room to offer choice of home health agency.  Pt chooses [facility] to render HHPT. Address and contact information verified with pt. No DME needed.  Referral called to [facility].  No other CM needs were communicated.  Mariane Masters, BSN, CM [phone].

## 2014-10-19 ENCOUNTER — Encounter (HOSPITAL_COMMUNITY): Payer: Self-pay | Admitting: Orthopedic Surgery

## 2014-10-19 NOTE — Progress Notes (Signed)
Discharge summary sent to payer through [facility]  

## 2014-10-23 NOTE — Discharge Summary (Signed)
Physician Discharge Summary  Patient ID: TIKISHA MOLINARO MRN: [id number] DOB/AGE: [birth year] 63 y.o.  Admit date: 10/17/2014 Discharge date: 10/18/2014   Procedures:  Procedure(s) (LRB): LEFT TOTAL HIP ARTHROPLASTY ANTERIOR APPROACH (Left)  Attending Physician:  Dr. Paralee Cancel   Admission Diagnoses:   Left hip primary OA / pain  Discharge Diagnoses:  Active Problems:   S/P left THA, AA  Past Medical History  Diagnosis Date  . HSV infection     vag  . Allergy     seasonal allergy  . Depression   . Arthritis     left hip    HPI: SERRENA LINDERMAN, 63 y.o. female, has a history of pain and functional disability in the left hip(s) due to arthritis and patient has failed non-surgical conservative treatments for greater than 12 weeks to include NSAID's and/or analgesics, corticosteriod injections and activity modification. Onset of symptoms was gradual starting years ago with gradually worsening course since that time.The patient noted no past surgery on the left hip(s). Patient currently rates pain in the left hip at 5 out of 10 with activity. Patient has worsening of pain with activity and weight bearing, trendelenberg gait, pain that interfers with activities of daily living and pain with passive range of motion. Patient has evidence of periarticular osteophytes and joint space narrowing by imaging studies. This condition presents safety issues increasing the risk of falls. There is no current active infection. Risks, benefits and expectations were discussed with the patient. Risks including but not limited to the risk of anesthesia, blood clots, nerve damage, blood vessel damage, failure of the prosthesis, infection and up to and including death. Patient understand the risks, benefits and expectations and wishes to proceed with surgery.   PCP: Joycelyn Man, MD   Discharged Condition: good  Hospital Course:  Patient underwent the above stated procedure on 10/17/2014. Patient  tolerated the procedure well and brought to the recovery room in good condition and subsequently to the floor.  POD #1 BP: 94/47 ; Pulse: 52 ; Temp: 98.4 F (36.9 C)  Patient reports pain as mild, pain controlled. Some hypotensive episodes this morning and with PT. She states that she usually runs low, and may times is lightheaded when standing. Otherwise no events throughout the night. Ready to be discharged home if her hypotension and dizziness resolve. Dorsiflexion/plantar flexion intact, incision: dressing C/D/I, no cellulitis present and compartment soft.   LABS  Basename    HGB  10.2  HCT  31.1    Discharge Exam: General appearance: alert, cooperative and no distress Extremities: Homans sign is negative, no sign of DVT, no edema, redness or tenderness in the calves or thighs and no ulcers, gangrene or trophic changes  Disposition: Home with follow up in 2 weeks   Follow-up Information    Follow up with [facility].   Why:  home health physical therapy   Contact information:   [address] [place] [address] [phone]       Follow up with Mauri Pole, MD. Schedule an appointment as soon as possible for a visit in 2 weeks.   Specialty:  Orthopedic Surgery   Contact information:   [address] [place] [address] [phone]       Discharge Instructions    Call MD / Call 911    Complete by:  As directed   If you experience chest pain or shortness of breath, CALL 911 and be transported to the hospital  emergency room.  If you develope a fever above 101 F, pus (white drainage) or increased drainage or redness at the wound, or calf pain, call your surgeon's office.     Change dressing    Complete by:  As directed   Maintain surgical dressing for 10-14 days, or until follow up in the clinic.     Constipation Prevention    Complete by:  As directed   Drink plenty of fluids.  Prune juice may be helpful.  You may use a stool  softener, such as Colace (over the counter) 100 mg twice a day.  Use MiraLax (over the counter) for constipation as needed.     Diet - low sodium heart healthy    Complete by:  As directed      Discharge instructions    Complete by:  As directed   Maintain surgical dressing for 10-14 days, or until follow up in the clinic. Follow up in 2 weeks at [facility]. Call with any questions or concerns.     Increase activity slowly as tolerated    Complete by:  As directed      TED hose    Complete by:  As directed   Use stockings (TED hose) for 2 weeks on both leg(s).  You may remove them at night for sleeping.     Weight bearing as tolerated    Complete by:  As directed   Laterality:  left  Extremity:  Lower             Medication List    STOP taking these medications        acetaminophen 500 MG tablet  Commonly known as:  TYLENOL     naproxen sodium 220 MG tablet  Commonly known as:  ANAPROX      TAKE these medications        acyclovir 200 MG capsule  Commonly known as:  ZOVIRAX  Take 200 mg by mouth 5 (five) times daily as needed (breakouts).     aspirin 325 MG EC tablet  Take 1 tablet (325 mg total) by mouth 2 (two) times daily.     cetirizine 10 MG tablet  Commonly known as:  ZYRTEC  Take 10 mg by mouth daily.     citalopram 20 MG tablet  Commonly known as:  CELEXA  TAKE 1 AND 1/2 TABLETS BY MOUTH AT BEDTIME     cycloSPORINE 0.05 % ophthalmic emulsion  Commonly known as:  RESTASIS  Place 1 drop into both eyes every 12 (twelve) hours.     DSS 100 MG Caps  Take 100 mg by mouth 2 (two) times daily.     ferrous sulfate 325 (65 FE) MG tablet  Take 1 tablet (325 mg total) by mouth 3 (three) times daily after meals.     HYDROcodone-acetaminophen 7.5-325 MG per tablet  Commonly known as:  NORCO  Take 1-2 tablets by mouth every 4 (four) hours as needed for moderate pain.     methocarbamol 500 MG tablet  Commonly known as:  ROBAXIN  Take 1 tablet (500  mg total) by mouth every 6 (six) hours as needed for muscle spasms.     polyethylene glycol packet  Commonly known as:  MIRALAX / GLYCOLAX  Take 17 g by mouth 2 (two) times daily.         Signed: West Pugh. Authur Cubit   PA-C  10/23/2014, 3:32 PM

## 2015-01-12 ENCOUNTER — Other Ambulatory Visit: Payer: Self-pay | Admitting: Family Medicine

## 2015-08-03 IMAGING — DX DG HIP 1V PORT*L*
1 series · 1 of 1 positions shown · non-contrast
Comparison: Portable exam 7937 hr compared to 11/24/2011 and
correlated with portable pelvic radiograph of 10/17/2014

CLINICAL DATA: Post LEFT total hip replacement

EXAM:
PORTABLE LEFT HIP - 1 VIEW

[hip ap]
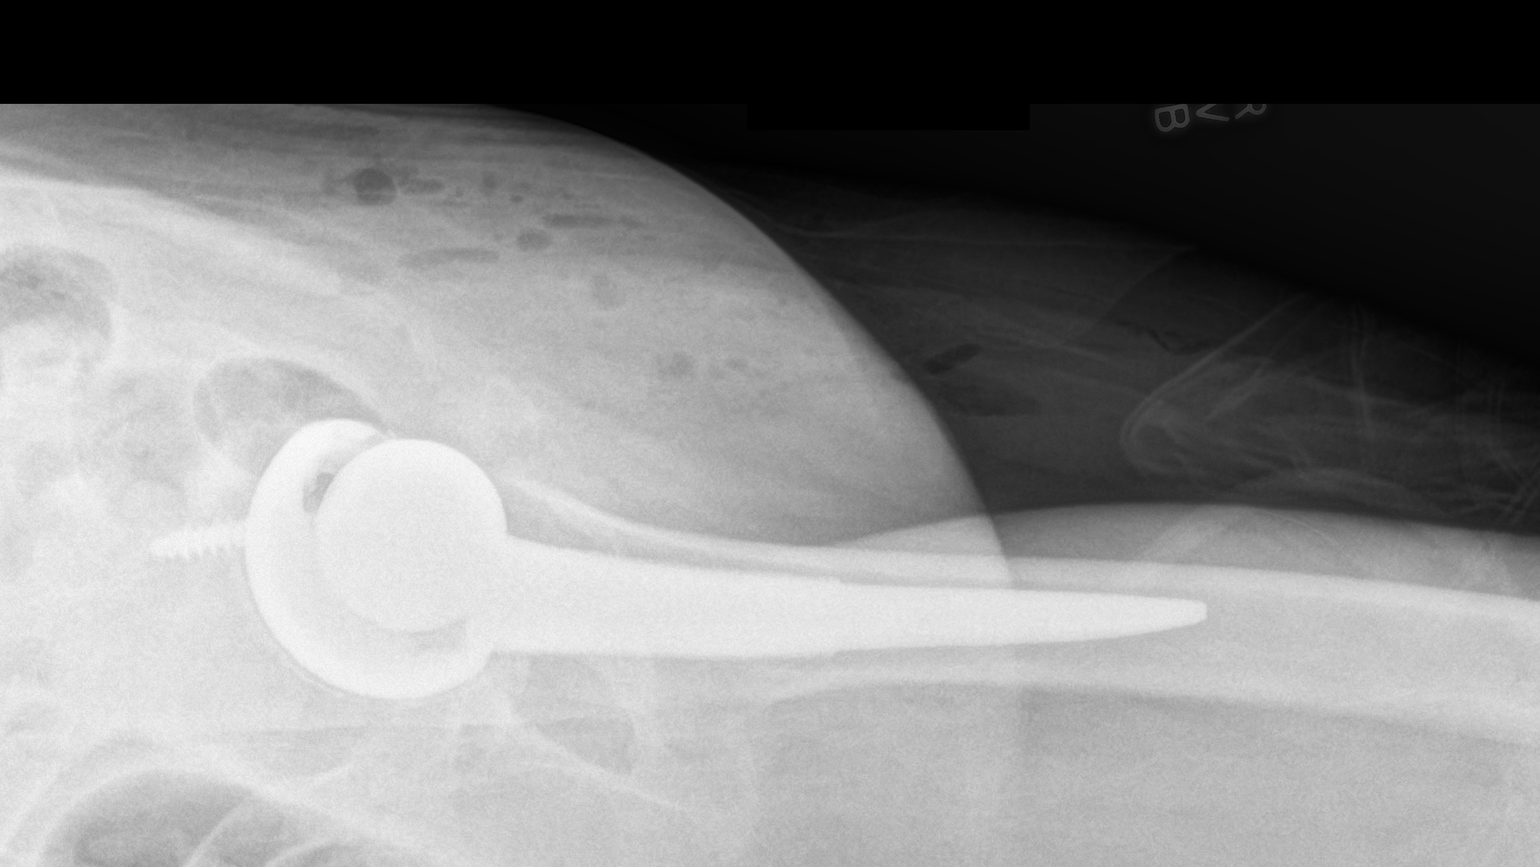

[1 of 1 positions shown; findings below may reference images not displayed]

FINDINGS: Acetabular femoral components of a LEFT hip prosthesis are
identified.

No acute fracture or dislocation.

Bones demineralized.

Expected postsurgical soft tissue changes regionally.
IMPRESSION: LEFT hip prosthesis without acute complication.

Osseous demineralization.

## 2015-08-03 IMAGING — DX DG PORTABLE PELVIS
1 series · 1 of 1 positions shown · non-contrast
Comparison: Portable exam 2522 hr compared to 11/24/2011

CLINICAL DATA: Post LEFT total hip replacement

EXAM:
PORTABLE PELVIS 1-2 VIEWS

[pelvis ap]
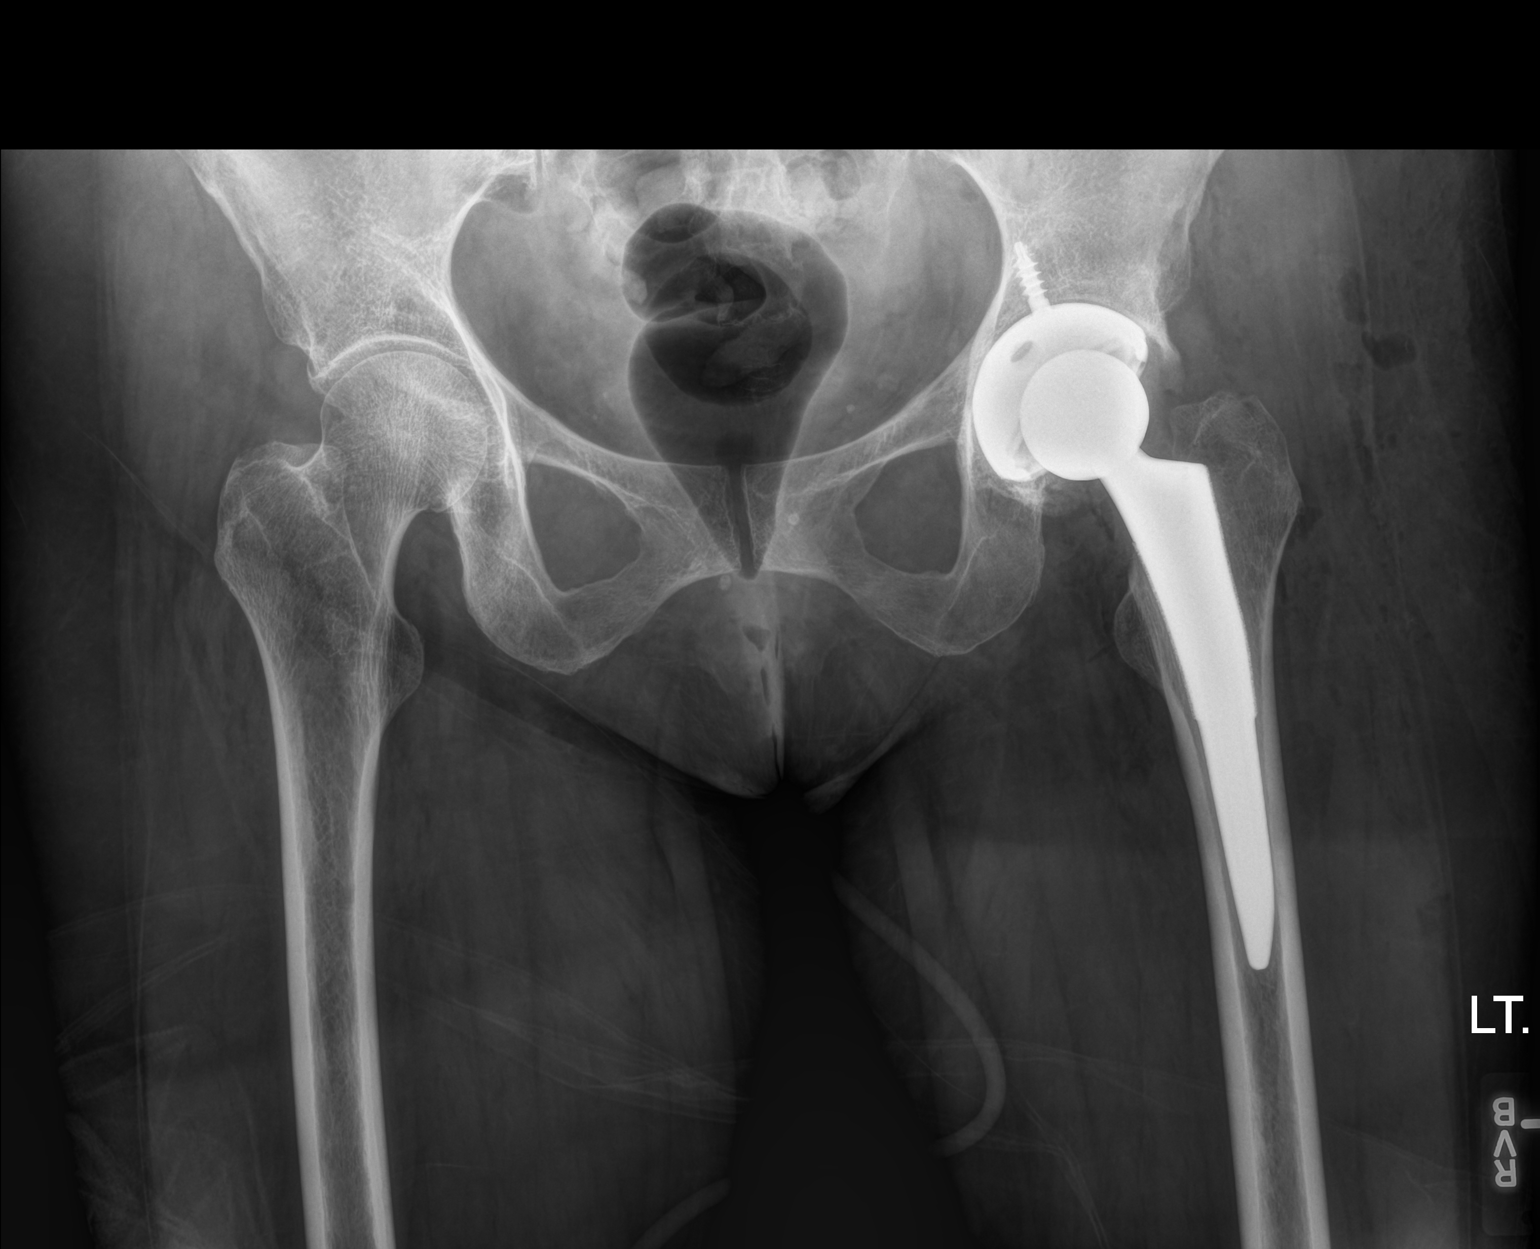

[1 of 1 positions shown; findings below may reference images not displayed]

FINDINGS: Acetabular and femoral components of a LEFT hip prosthesis are
identified.

No acute fracture dislocation.

Diffuse osseous demineralization.

Minimal narrowing of RIGHT hip joint.

Visualized pelvis intact.
IMPRESSION: LEFT hip prosthesis without acute complication.

## 2015-11-08 ENCOUNTER — Other Ambulatory Visit: Payer: Self-pay | Admitting: Family Medicine

## 2015-12-10 ENCOUNTER — Encounter: Payer: Self-pay | Admitting: Family Medicine

## 2015-12-10 ENCOUNTER — Ambulatory Visit (INDEPENDENT_AMBULATORY_CARE_PROVIDER_SITE_OTHER): Payer: BLUE CROSS/BLUE SHIELD | Admitting: Family Medicine

## 2015-12-10 VITALS — BP 110/80 | Temp 97.8°F | Wt 157.0 lb

## 2015-12-10 DIAGNOSIS — F329 Major depressive disorder, single episode, unspecified: Secondary | ICD-10-CM

## 2015-12-10 DIAGNOSIS — F32A Depression, unspecified: Secondary | ICD-10-CM

## 2015-12-10 MED ORDER — CITALOPRAM HYDROBROMIDE 20 MG PO TABS: ORAL_TABLET | ORAL | Status: DC

## 2015-12-10 NOTE — Patient Instructions (Signed)
Celexa 20 mg.......... one and half tablets daily at bedtime  Return in one year sooner if any problems

## 2015-12-10 NOTE — Progress Notes (Signed)
Pre visit review using our clinic review tool, if applicable. No additional management support is needed unless otherwise documented below in the visit note. 

## 2015-12-10 NOTE — Progress Notes (Signed)
   Subjective:    Patient ID: Veronica Barrett, female    DOB: 19-Aug-1951, 65 y.o.   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 She does not need a physical examination. She says she gets her physical examination her GYN office  Sugar left hip replaced in [id number] and had no complications.  Social history is still complicated by the fact her husband Veronica Barrett is smoking and still drinking. However she says he is not drinking any hard liquor anymore. Her social system has been very difficult   Review of Systems    review of systems otherwise negative she's up on all health maintenance activities Objective:   Physical Exam  Well-developed well-nourished female no acute distress vital signs stable she's afebrile      Assessment & Plan:  History of mild depression.....................Marland Kitchen

## 2016-08-29 ENCOUNTER — Other Ambulatory Visit: Payer: Self-pay | Admitting: Endocrinology

## 2016-09-03 ENCOUNTER — Telehealth: Payer: Self-pay | Admitting: Endocrinology

## 2016-09-03 NOTE — Telephone Encounter (Signed)
Pt states Dr Sherren Mocha is her dr and dr Dwyane Dee only filled this because Dr Sherren Mocha was out. Can you refill Dr Sherren Mocha?  [facility] [address]

## 2016-09-10 ENCOUNTER — Other Ambulatory Visit: Payer: Self-pay | Admitting: Emergency Medicine

## 2016-09-10 MED ORDER — ACYCLOVIR 200 MG PO CAPS: 200.0000 mg | ORAL_CAPSULE | Freq: Every day | ORAL | 3 refills | Status: DC | PRN

## 2016-09-10 NOTE — Telephone Encounter (Signed)
Pt would like to know if Dr Sherren Mocha will please refill this rx Request sent last week. Thanks. Please call pt at [phone]   [facility] [address]

## 2016-09-10 NOTE — Telephone Encounter (Signed)
Called an spoke with pt informing her that medications have been sent to pharmacy. Pt verbalized understanding.

## 2016-12-05 ENCOUNTER — Other Ambulatory Visit: Payer: Self-pay | Admitting: Family Medicine

## 2017-01-16 ENCOUNTER — Other Ambulatory Visit: Payer: Self-pay | Admitting: Obstetrics & Gynecology

## 2017-01-16 DIAGNOSIS — E2839 Other primary ovarian failure: Secondary | ICD-10-CM

## 2017-01-27 ENCOUNTER — Other Ambulatory Visit: Payer: BLUE CROSS/BLUE SHIELD

## 2017-07-13 ENCOUNTER — Other Ambulatory Visit: Payer: Self-pay | Admitting: Family Medicine

## 2017-12-15 ENCOUNTER — Other Ambulatory Visit: Payer: Self-pay | Admitting: Family Medicine

## 2018-03-22 ENCOUNTER — Encounter: Payer: Self-pay | Admitting: Family Medicine

## 2018-03-22 ENCOUNTER — Ambulatory Visit: Payer: Self-pay | Admitting: *Deleted

## 2018-03-22 ENCOUNTER — Ambulatory Visit (INDEPENDENT_AMBULATORY_CARE_PROVIDER_SITE_OTHER): Payer: Medicare Other | Admitting: Family Medicine

## 2018-03-22 VITALS — BP 118/76 | HR 52 | Temp 97.5°F | Wt 171.0 lb

## 2018-03-22 DIAGNOSIS — J45909 Unspecified asthma, uncomplicated: Secondary | ICD-10-CM | POA: Diagnosis not present

## 2018-03-22 DIAGNOSIS — J301 Allergic rhinitis due to pollen: Secondary | ICD-10-CM | POA: Diagnosis not present

## 2018-03-22 MED ORDER — HYDROCODONE-HOMATROPINE 5-1.5 MG/5ML PO SYRP: ORAL_SOLUTION | ORAL | 0 refills | Status: DC

## 2018-03-22 MED ORDER — ALBUTEROL SULFATE HFA 108 (90 BASE) MCG/ACT IN AERS
2.0000 | INHALATION_SPRAY | Freq: Four times a day (QID) | RESPIRATORY_TRACT | 2 refills | Status: DC | PRN
Start: 2018-03-22 — End: 2021-04-12

## 2018-03-22 MED ORDER — DOXYCYCLINE HYCLATE 100 MG PO TABS: 100.0000 mg | ORAL_TABLET | Freq: Two times a day (BID) | ORAL | 0 refills | Status: DC

## 2018-03-22 MED ORDER — PREDNISONE 20 MG PO TABS: ORAL_TABLET | ORAL | 1 refills | Status: DC

## 2018-03-22 NOTE — Progress Notes (Signed)
Is being out of history and physicalWanda is a 67 year old married female nonsmoker.... Banker by trade...Marland KitchenMarland KitchenMarland Kitchen who comes in today for evaluation of allergic rhinitis and asthma  A month ago she began having allergic rhinitis symptom was sneezing runny nose water a congestion postnasal drip. 10 days ago she began wheezing. She's tried over-the-counter medications to no avail  No environmental changes  BP 118/76 (BP Location: Left Arm, Patient Position: Sitting, Cuff Size: Normal)   Pulse (!) 52   Temp (!) 97.5 F (36.4 C) (Oral)   Wt 171 lb (77.6 kg)   SpO2 97%   BMI 27.60 kg/m  BP 118/76 (BP Location: Left Arm, Patient Position: Sitting, Cuff Size: Normal)   Pulse (!) 52   Temp (!) 97.5 F (36.4 C) (Oral)   Wt 171 lb (77.6 kg)   SpO2 97%   BMI 27.60 kg/m  Well-developed well-nourished female no acute distress vital signs stable she's afebrile HEENT pertinent bilateral nasal edema on a postnasal drip. Neck was supple no adenopathy lungs showed symmetrical inspiratory and expiratory mild to moderate wheezing.  #1 allergic rhinitis......... antihistamine with steroid nasal spray  #2 asthma...........Marland Kitchen prednisone burst and taper

## 2018-03-22 NOTE — Telephone Encounter (Signed)
  Called in c/o wheezing since last week and "feeling like her throat is closing" also since last week.   "This has happened to me before during allergy season 3 years ago".   "They gave me a breathing treatment and prednisone which helped."   "This feels the same".     Denies being short of breath.   She does sound congested and stuffy.  I made her an appt with Dr. Sherren Mocha for today at 1:30.   Reason for Disposition . [1] Taking antihistamines > 2 days AND [2] nasal allergy symptoms interfere with sleep, school, or work  Answer Assessment - Initial Assessment Questions 1. SYMPTOM: "What's the main symptom you're concerned about?" (e.g., runny nose, stuffiness, sneezing, itching)     I'm wheezing since last week.   I used some Afrin yesterday.    My throat feels like it's closing up. 2. SEVERITY: "How bad is it?" "What does it keep you from doing?" (e.g., sleeping, working)      My throat feels like it could close since Thursday before last.   I think it's allergies.   If I'm up walking round I'm fine it's worse when I'm trying to sleep.   I'm wheezing a little bit. 3. EYES: "Are the eyes also red, watery, and itchy?"      No. 4. TRIGGER: "What pollen or other allergic substance do you think is causing the symptoms?"      I think it's the pollen.   I've had this before 3 years ago.   They gave me prednisone and a breathing treatment. 5. TREATMENT: "What medicine are you using?" "What medicine worked best in the past?"     Afrin yesterday.   I took a Zyrtec last night and a Robutussin.   I'm not coughing now but was in the beginning. 6. OTHER SYMPTOMS: "Do you have any other symptoms?" (e.g., coughing, difficulty breathing, wheezing)     No coughing now.   My nose is stopped up.   I've had some headaches. 7. PREGNANCY: "Is there any chance you are pregnant?" "When was your last menstrual period?"     Not asked.  Protocols used: NASAL ALLERGIES (HAY FEVER)-A-AH

## 2018-03-22 NOTE — Patient Instructions (Signed)
Prednisone 20 mg.... 2 tabs now..... 2 tabs tonight........ then starting tomorrow morning 2 tabs daily until you wheezing has abated.......... then taper as outlined  Hydromet.........Marland Kitchen 1/2-1 teaspoon daily at bedtime. For nighttime cough  Albuterol.............. 2 puffs 3 times daily when necessary  Doxy 100mg .................one bid x 10 days  Return when necessary

## 2018-04-05 ENCOUNTER — Ambulatory Visit (INDEPENDENT_AMBULATORY_CARE_PROVIDER_SITE_OTHER): Payer: Medicare Other | Admitting: Family Medicine

## 2018-04-05 ENCOUNTER — Encounter: Payer: Self-pay | Admitting: Family Medicine

## 2018-04-05 VITALS — BP 120/80 | HR 58 | Temp 98.2°F | Wt 174.7 lb

## 2018-04-05 DIAGNOSIS — J309 Allergic rhinitis, unspecified: Secondary | ICD-10-CM | POA: Diagnosis not present

## 2018-04-05 NOTE — Progress Notes (Signed)
  Subjective:     Patient ID: Veronica Barrett, female   DOB: Jun 28, 1951, 67 y.o.   MRN: [id number]  HPI Patient seen with some persistent sinus congestive symptoms. Refer to recent note. She was treated with doxycycline and prednisone and felt slightly better when she first started this. She is still finishing out prednisone. She has had some occasional headaches. Increased malaise. Denies any sore throat or fever. No cough. She has some slight discomfort around her ethmoid sinus region. No bloody nasal discharge. No colored nasal discharge. She was on doxycycline when she went to the beach. She's had some mild redness dorsum of the toes.  Had minimal exposure to sun at beach.  Review of Systems  Constitutional: Negative for chills and fever.  HENT: Positive for congestion.   Respiratory: Negative for cough.        Objective:   Physical Exam  Constitutional: She appears well-developed and well-nourished.  HENT:  Right Ear: External ear normal.  Left Ear: External ear normal.  Nose: Nose normal.  Mouth/Throat: Oropharynx is clear and moist.  Neck: Neck supple.  Cardiovascular: Normal rate and regular rhythm.  Pulmonary/Chest: Effort normal and breath sounds normal. She has no wheezes. She has no rales.  Lymphadenopathy:    She has no cervical adenopathy.       Assessment:     Sinus congestive symptoms. Differential is persistent low-grade sinus infection versus allergic rhinitis    Plan:     -Add Flonase nasal. Continue with Zyrtec.   -Touch base for any bloody nasal discharge or any purulent type secretions -Increase hydration -Consider saline nasal irrigation  Eulas Post MD [facility]

## 2018-04-05 NOTE — Patient Instructions (Signed)
Get back on daily use of Flonase nasal.  Increase hydration with more water.  Continue with Zyrtec.  If nasal symptoms not improving over next few days let me know.

## 2018-08-12 DIAGNOSIS — Z1231 Encounter for screening mammogram for malignant neoplasm of breast: Secondary | ICD-10-CM | POA: Diagnosis not present

## 2018-08-13 ENCOUNTER — Other Ambulatory Visit: Payer: Self-pay | Admitting: Obstetrics & Gynecology

## 2018-08-13 DIAGNOSIS — R928 Other abnormal and inconclusive findings on diagnostic imaging of breast: Secondary | ICD-10-CM

## 2018-08-18 ENCOUNTER — Ambulatory Visit
Admission: RE | Admit: 2018-08-18 | Discharge: 2018-08-18 | Disposition: A | Discharge status: Home / Self Care | Payer: Medicare Other | Source: Ambulatory Visit | Attending: Obstetrics & Gynecology | Admitting: Obstetrics & Gynecology

## 2018-08-18 ENCOUNTER — Ambulatory Visit: Payer: Medicare Other

## 2018-08-18 ENCOUNTER — Other Ambulatory Visit: Payer: Self-pay | Admitting: Obstetrics and Gynecology

## 2018-08-18 DIAGNOSIS — R928 Other abnormal and inconclusive findings on diagnostic imaging of breast: Secondary | ICD-10-CM

## 2018-08-20 DIAGNOSIS — H2513 Age-related nuclear cataract, bilateral: Secondary | ICD-10-CM | POA: Diagnosis not present

## 2018-08-20 DIAGNOSIS — H04123 Dry eye syndrome of bilateral lacrimal glands: Secondary | ICD-10-CM | POA: Diagnosis not present

## 2018-08-20 DIAGNOSIS — H25013 Cortical age-related cataract, bilateral: Secondary | ICD-10-CM | POA: Diagnosis not present

## 2018-08-24 ENCOUNTER — Encounter: Payer: Self-pay | Admitting: Family Medicine

## 2018-08-24 ENCOUNTER — Ambulatory Visit (INDEPENDENT_AMBULATORY_CARE_PROVIDER_SITE_OTHER): Payer: Medicare Other | Admitting: Family Medicine

## 2018-08-24 VITALS — BP 102/64 | HR 56 | Temp 98.7°F | Ht 64.0 in | Wt 170.1 lb

## 2018-08-24 DIAGNOSIS — J309 Allergic rhinitis, unspecified: Secondary | ICD-10-CM

## 2018-08-24 DIAGNOSIS — B009 Herpesviral infection, unspecified: Secondary | ICD-10-CM | POA: Diagnosis not present

## 2018-08-24 DIAGNOSIS — F329 Major depressive disorder, single episode, unspecified: Secondary | ICD-10-CM | POA: Diagnosis not present

## 2018-08-24 DIAGNOSIS — F32A Depression, unspecified: Secondary | ICD-10-CM

## 2018-08-24 LAB — BASIC METABOLIC PANEL
BUN: 14 mg/dL (ref 6–23)
CO2: 27 mEq/L (ref 19–32)
Calcium: 9.4 mg/dL (ref 8.4–10.5)
Chloride: 103 mEq/L (ref 96–112)
Creatinine, Ser: 0.73 mg/dL (ref 0.40–1.20)
GFR: 84.61 mL/min (ref >60.00)
Glucose, Bld: 92 mg/dL (ref 70–99)
POTASSIUM: 4.1 meq/L (ref 3.5–5.1)
SODIUM: 138 meq/L (ref 135–145)

## 2018-08-24 LAB — CBC WITH DIFFERENTIAL/PLATELET
Basophils Absolute: 0 10*3/uL (ref 0.0–0.1)
Basophils Relative: 0.5 % (ref 0.0–3.0)
Eosinophils Absolute: 0.2 10*3/uL (ref 0.0–0.7)
Eosinophils Relative: 4.7 % (ref 0.0–5.0)
HEMATOCRIT: 38.9 % (ref 36.0–46.0)
HEMOGLOBIN: 12.8 g/dL (ref 12.0–15.0)
LYMPHS PCT: 44.4 % (ref 12.0–46.0)
Lymphs Abs: 2.2 10*3/uL (ref 0.7–4.0)
MCHC: 33 g/dL (ref 30.0–36.0)
MCV: 94.3 fl (ref 78.0–100.0)
MONOS PCT: 8.8 % (ref 3.0–12.0)
Monocytes Absolute: 0.4 10*3/uL (ref 0.1–1.0)
Neutro Abs: 2.1 10*3/uL (ref 1.4–7.7)
Neutrophils Relative %: 41.6 % — ABNORMAL LOW (ref 43.0–77.0)
Platelets: 265 10*3/uL (ref 150.0–400.0)
RBC: 4.13 Mil/uL (ref 3.87–5.11)
RDW: 13.4 % (ref 11.5–15.5)
WBC: 4.9 10*3/uL (ref 4.0–10.5)

## 2018-08-24 LAB — LIPID PANEL
Cholesterol: 219 mg/dL — ABNORMAL HIGH (ref 0–200)
HDL: 56.2 mg/dL (ref >39.00)
LDL CALC: 147 mg/dL — AB (ref 0–99)
NonHDL: 162.4
TRIGLYCERIDES: 79 mg/dL (ref 0.0–149.0)
Total CHOL/HDL Ratio: 4
VLDL: 15.8 mg/dL (ref 0.0–40.0)

## 2018-08-24 LAB — POCT URINALYSIS DIPSTICK
Bilirubin, UA: NEGATIVE
Blood, UA: NEGATIVE
Glucose, UA: NEGATIVE
Ketones, UA: NEGATIVE
Leukocytes, UA: NEGATIVE
NITRITE UA: NEGATIVE
ODOR: NEGATIVE
PH UA: 6 (ref 5.0–8.0)
Protein, UA: NEGATIVE
Spec Grav, UA: 1.015 (ref 1.010–1.025)
Urobilinogen, UA: 0.2 E.U./dL

## 2018-08-24 LAB — TSH: TSH: 1.85 u[IU]/mL (ref 0.35–4.50)

## 2018-08-24 LAB — HEPATIC FUNCTION PANEL
ALBUMIN: 4.5 g/dL (ref 3.5–5.2)
ALT: 17 U/L (ref 0–35)
AST: 18 U/L (ref 0–37)
Alkaline Phosphatase: 80 U/L (ref 39–117)
Bilirubin, Direct: 0.1 mg/dL (ref 0.0–0.3)
TOTAL PROTEIN: 6.9 g/dL (ref 6.0–8.3)
Total Bilirubin: 0.7 mg/dL (ref 0.2–1.2)

## 2018-08-24 MED ORDER — CITALOPRAM HYDROBROMIDE 20 MG PO TABS: ORAL_TABLET | ORAL | 3 refills | Status: DC

## 2018-08-24 MED ORDER — ACYCLOVIR 200 MG PO CAPS: 200.0000 mg | ORAL_CAPSULE | Freq: Every day | ORAL | 3 refills | Status: DC | PRN

## 2018-08-24 NOTE — Patient Instructions (Signed)
Labs today........... I will call if there is anything abnormal  Continue your carbohydrate restricted diet and exercise program  Seasonal flu shot and Pneumovax as we discussed  Call your insurance company to find that we can get the new shingles vaccine.

## 2018-08-24 NOTE — Progress Notes (Signed)
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  She also has intermittent asthma when she gets a bad cold.  She uses albuterol as needed  She has history of mild depression for which she takes Celexa 30 mg daily at bedtime  She has a history of intermittent HSV-1 for which she takes acyclovir as needed  She gets routine eye care, dental care, BSE monthly, annual mammography, colonoscopy 2014 was normal.  Vaccinations she will return for seasonal flu shot Pneumovax information given on the shingles vaccine  Her last pelvic and Pap GYN was in 2016 she wants to go back and see them for follow-up.  Cognitive function normal she walks daily home health safety reviewed no issues identified, no guns in the house, she does have a healthcare power of attorney and living will  14 point review of system reviewed otherwise negative  BP 102/64 (BP Location: Left Arm, Patient Position: Sitting, Cuff Size: Large)   Pulse (!) 56   Temp 98.7 F (37.1 C) (Oral)   Ht 5\' 4"  (1.626 m)   Wt 170 lb 1.6 oz (77.2 kg)   SpO2 97%   BMI 29.20 kg/m  Well-developed well-nourished female no acute distress vital signs stable she is afebrile HEENT were negative neck was supple thyroid is not enlarged no carotid bruits.  Cardiopulmonary exam normal.  Abdominal exam normal.  Pelvic rectal and breast exam by GYN therefore deferred.  Extremities normal skin normal peripheral pulses normal  1.  Healthy female  2.  Allergic rhinitis........ continue the Zyrtec  3.  History of mild depression....... continue Celexa 30 mg daily  4.  Mild but recurrent HSV-1........ acyclovir as needed

## 2018-08-31 ENCOUNTER — Ambulatory Visit (INDEPENDENT_AMBULATORY_CARE_PROVIDER_SITE_OTHER): Payer: Medicare Other

## 2018-08-31 DIAGNOSIS — Z23 Encounter for immunization: Secondary | ICD-10-CM | POA: Diagnosis not present

## 2018-10-07 ENCOUNTER — Encounter: Payer: Self-pay | Admitting: Family Medicine

## 2018-10-07 ENCOUNTER — Ambulatory Visit (INDEPENDENT_AMBULATORY_CARE_PROVIDER_SITE_OTHER): Payer: Medicare Other | Admitting: Family Medicine

## 2018-10-07 VITALS — BP 119/78 | HR 63 | Temp 98.3°F | Ht 65.5 in | Wt 170.0 lb

## 2018-10-07 DIAGNOSIS — J309 Allergic rhinitis, unspecified: Secondary | ICD-10-CM

## 2018-10-07 DIAGNOSIS — J3089 Other allergic rhinitis: Secondary | ICD-10-CM | POA: Diagnosis not present

## 2018-10-07 DIAGNOSIS — J329 Chronic sinusitis, unspecified: Secondary | ICD-10-CM | POA: Diagnosis not present

## 2018-10-07 DIAGNOSIS — E785 Hyperlipidemia, unspecified: Secondary | ICD-10-CM | POA: Diagnosis not present

## 2018-10-07 DIAGNOSIS — Z9889 Other specified postprocedural states: Secondary | ICD-10-CM | POA: Insufficient documentation

## 2018-10-07 NOTE — Progress Notes (Signed)
New patient office visit note:  Impression and Recommendations:    1. Environmental and seasonal allergies   2. Allergic rhinitis, unspecified seasonality, unspecified trigger   3. Chronic sinusitis, unspecified location   4. Hyperlipidemia, unspecified hyperlipidemia type      - Extensive discussion held with patient regarding establishing as a new patient.  Discussed policies and practices here at the clinic, educated patient regarding prudent health management, and answered all questions about care team and health management during appointment.  - Reviewed importance of keeping up with regular screening per recommendations.  1. Hyperlipidemia - LDL elevated last check. - Handout provided today on low cholesterol diet.  The 10-year ASCVD risk score Veronica Barrett DC Veronica Barrett., et al., 2013) is: 5.6%   Values used to calculate the score:     Age: 60 years     Sex: Female     Is Non-Hispanic African American: No     Diabetic: No     Tobacco smoker: No     Systolic Blood Pressure: 480 mmHg     Is BP treated: No     HDL Cholesterol: 56.2 mg/dL     Total Cholesterol: 219 mg/dL  2. Chronic Allergies/Hay Fever - Advised the patient to begin using AYR or Neilmed sinus rinses BID.  Advised that the patient may also incorporate allegra or claritin PRN.   - Advised patient to refer to her surgeon's instructions about care prior to and after her sinus reconstruction.  - Will continue to monitor.  3. BMI Counseling Explained to patient what BMI refers to, and what it means medically.    Told patient to think about it as a "medical risk stratification measurement" and how increasing BMI is associated with increasing risk/ or worsening state of various diseases such as hypertension, hyperlipidemia, diabetes, premature OA, depression etc.  American Heart Association guidelines for healthy diet, basically Mediterranean diet, and exercise guidelines of 30 minutes 5 days per week or more  discussed in detail.  Health counseling performed.  All questions answered.  4. Lifestyle & Preventative Health Maintenance - Advised patient to continue working toward exercising to improve overall mental, physical, and emotional health.    - Reviewed the "spokes of the wheel" of mood and health management.  Stressed the importance of ongoing prudent habits, including regular exercise, appropriate sleep hygiene, healthful dietary habits, and prayer/meditation to relax.  - Encouraged patient to engage in daily physical activity, especially a formal exercise routine.  Recommended that the patient eventually strive for at least 150 minutes of moderate cardiovascular activity per week according to guidelines established by the [facility].   - Recommended that the patient begin to seek more physical activities and socialization outlets, such as classes at the [facility].  - Healthy dietary habits encouraged, including low-carb, and high amounts of lean protein in diet.   - Patient should also consume adequate amounts of water.   Education and routine counseling performed. Handouts provided.  5. Follow-Up - Prescriptions refilled today. - Obtain fasting lab work for baseline as recommended PRN. - Otherwise, continue to return for CPE and chronic follow-up as scheduled.   - Patient knows to call in sooner if desired to address acute concerns.     Medications Discontinued During This Encounter  Medication Reason  . cetirizine (ZYRTEC) 10 MG tablet Change in therapy      Gross side effects, risk and benefits, and alternatives of medications discussed with patient.  Patient is aware that all  medications have potential side effects and we are unable to predict every side effect or drug-drug interaction that may occur.  Expresses verbal understanding and consents to current therapy plan and treatment regimen.  Return for f/up 4-97moand prn .  Please see AVS handed out to patient at the end of our  visit for further patient instructions/ counseling done pertaining to today's office visit.    Note:  This document was prepared using Dragon voice recognition software and may include unintentional dictation errors.   This document serves as a record of services personally performed by DMellody Dance DO. It was created on her behalf by KToni Amend a trained medical scribe. The creation of this record is based on the scribe's personal observations and the provider's statements to them.   I have reviewed the above medical documentation for accuracy and completeness and I concur.  DMellody Dance DO 10/11/2018 7:00 AM       ----------------------------------------------------------------------------------------------------------    Subjective:    Chief complaint:   Chief Complaint  Patient presents with  . Establish Care     HPI: Veronica MEADORSis a pleasant 67y.o. [place] [facility] and establish care.   I asked the patient to review their chronic problem list with me to ensure everything was updated and accurate.    All recent office visits with other providers, any medical records that patient brought in etc  - I reviewed today.     We asked pt to get [place] [facility]/ specialists that they had seen within the past 3-5 years- if they are in private practice and/or do not [facility]  Told them to call their specialists to clarify this if they are not sure.    Here to establish after former PCP Veronica Barrett.  Husband TMarcello Barrett"Veronica Mussel [place] established today.  Notes she's done a lot of taking care of her husband, Veronica Mussel  He "has a muscular thing that needs to be looked at."  States that one of his legs is very atrophied and his balance is terrible.  [place]  Social History Retired bCustomer service manager  Worked at [employer] Has been retired for a year in August (2019). Notes enjoys being retired for the most part.  Married to husband Veronica Musselfor 33+ years. Two children, aged 261and 330 No grandkids.  Pet dog named ROvid Curd  H30corgi half spaniel.  Patient is very focused on Tom.  Tobacco Use Never smoker.  EtOH Use Drinks a couple of glasses of wine per night, white. Likes Chardonnay and Pinot, nothing really sweet.  Notes "more like my pours," so slightly more than 2 drinks per night.  Physical Activity Does not go to the gym.  Wants to start exercising with her friend, or getting her husband to walk down the block with her.  Notes she has a dog, and the dog helps her, but the dog walks too fast for Tom.  They have a treadmill at home "with clothes hanging on it."  Patient is not happy with her current weight.  States "this is the heaviest I've ever been."  Family History Sister had thyroid cancer. Papillary cancer in sister.  Denies history of unusual heart attacks or anything in family. "Nothing unusual."  Denies alcoholism, depression, diabetes.  Surgical History Past Surgical History:  Procedure Laterality Date  . APPENDECTOMY  1984  . EYE SURGERY Left    left vitreous  . TOTAL HIP ARTHROPLASTY Left 10/17/2014   Procedure: LEFT TOTAL HIP ARTHROPLASTY ANTERIOR APPROACH;  Surgeon: Mauri Pole, MD;  Location: [facility];  Service: Orthopedics;  Laterality: Left;    Past Medical History  Not established with dermatology.  Since retiring/since quitting work, states she has been feeling more congested than before.  Feels she is also drinking less water than she was before.  Denies drinking more alcohol after retiring.  - OBGYN Goes to [facility].  Notes her former OB retired and she is "waiting to be assigned to another one."  Not on hormone replacement.  - Hay Fever/Allergies Was taking Zyrtec; switched to Allegra this  week because the Zyrtec seemed to stop working.  - Oral & Maxillofacial Surgery Per patient, seeing Dr. Sabra Heck for Oral/Maxillofacial surgery in near future.  Notes she cracked a tooth.  Is having a sinus lift and "receptacle" placed.  - Hearing Loss/Tinnitus Experiences both difficulty hearing and ringing in her ears.  Notes her hearing is getting worse.  States "static, kind of more like static" or buzzing instead of ringing.   The cholesterol last visit was:  Lab Results  Component Value Date   CHOL 219 (H) 08/24/2018   HDL 56.20 08/24/2018   LDLCALC 147 (H) 08/24/2018   TRIG 79.0 08/24/2018   CHOLHDL 4 08/24/2018    Hepatic Function Latest Ref Rng & Units 08/24/2018 04/14/2013 04/15/2007  Total Protein 6.0 - 8.3 g/dL 6.9 6.5 6.6  Albumin 3.5 - 5.2 g/dL 4.5 3.8 4.0  AST 0 - 37 U/L 18 15 21   ALT 0 - 35 U/L 17 14 20   Alk Phosphatase 39 - 117 U/L 80 72 69  Total Bilirubin 0.2 - 1.2 mg/dL 0.7 0.8 0.8  Bilirubin, Direct 0.0 - 0.3 mg/dL 0.1 0.0 0.1     Wt Readings from Last 3 Encounters:  10/07/18 170 lb (77.1 kg)  08/24/18 170 lb 1.6 oz (77.2 kg)  04/05/18 174 lb 11.2 oz (79.2 kg)   BP Readings from Last 3 Encounters:  10/07/18 119/78  08/24/18 102/64  04/05/18 120/80   Pulse Readings from Last 3 Encounters:  10/07/18 63  08/24/18 (!) 56  04/05/18 (!) 58   BMI Readings from Last 3 Encounters:  10/07/18 27.86 kg/m  08/24/18 29.20 kg/m  04/05/18 28.20 kg/m    Patient Care Team    Relationship Specialty Notifications Start End  Mellody Dance, DO PCP - General Family Medicine  10/07/18   Paralee Cancel, MD Consulting Physician Orthopedic Surgery  10/07/18   Camillo Flaming, [place] Referring Physician Optometry  10/07/18     Patient Active Problem List   Diagnosis Date Noted  . Environmental and seasonal allergies 10/07/2018  . Chronic sinusitis 10/07/2018  . H/O sinus surgery 10/07/2018  . Hyperlipidemia 10/07/2018  . S/P left THA, AA 10/17/2014  . Recurrent HSV  (herpes simplex virus) 04/21/2013  . h/o Depression 02/20/2010  . Allergic rhinitis 04/16/2007       As reported by pt:  Past Medical History:  Diagnosis Date  . Allergy    seasonal allergy  . Arthritis    left hip  . Depression   . HSV infection    vag     Past Surgical History:  Procedure Laterality Date  . APPENDECTOMY  1984  . EYE SURGERY Left    left  vitreous  . TOTAL HIP ARTHROPLASTY Left 10/17/2014   Procedure: LEFT TOTAL HIP ARTHROPLASTY ANTERIOR APPROACH;  Surgeon: Mauri Pole, MD;  Location: [facility];  Service: Orthopedics;  Laterality: Left;     Family History  Problem Relation Age of Onset  . Cancer Father   . Cancer Sister   . Thyroid cancer Sister   . Arthritis Other   . Colon cancer Neg Hx      Social History   Substance and Sexual Activity  Drug Use No     Social History   Substance and Sexual Activity  Alcohol Use Yes  . Alcohol/week: 10.0 standard drinks  . Types: 10 Glasses of wine per week     Social History   Tobacco Use  Smoking Status Never Smoker  Smokeless Tobacco Never Used     Current Meds  Medication Sig  . acyclovir (ZOVIRAX) 200 MG capsule Take 1 capsule (200 mg total) by mouth 5 (five) times daily as needed (breakouts).  Marland Kitchen albuterol (PROVENTIL HFA;VENTOLIN HFA) 108 (90 Base) MCG/ACT inhaler Inhale 2 puffs into the lungs every 6 (six) hours as needed for wheezing or shortness of breath.  . citalopram (CELEXA) 20 MG tablet TAKE 1 AND 1/2 TABLETS BY MOUTH AT BEDTIME  . HYDROcodone-homatropine (HYCODAN) 5-1.5 MG/5ML syrup 1/2-1 teaspoon daily at bedtime when necessary for nighttime cough  . predniSONE (DELTASONE) 20 MG tablet 2 tabs x 3 days, 1 tab x 3 days, 1/2 tab x 3 days, 1/2 tab M,W,F x 2 weeks    Allergies: Tape   Review of Systems  Constitutional: Negative for chills, diaphoresis, fever, malaise/fatigue and weight loss.  HENT: Positive for hearing loss (chronic) and tinnitus (chronic, "buzzing").  Negative for congestion and sore throat.   Eyes: Negative for blurred vision, double vision and photophobia.  Respiratory: Negative for cough and wheezing.   Cardiovascular: Negative for chest pain and palpitations.  Gastrointestinal: Negative for blood in stool, diarrhea, nausea and vomiting.  Genitourinary: Negative for dysuria, frequency and urgency.  Musculoskeletal: Negative for joint pain and myalgias.  Skin: Negative for itching and rash.  Neurological: Negative for dizziness, focal weakness, weakness and headaches.  Endo/Heme/Allergies: Positive for environmental allergies (chronic). Negative for polydipsia. Does not bruise/bleed easily.  Psychiatric/Behavioral: Negative for depression and memory loss. The patient is not nervous/anxious and does not have insomnia.       Objective:   Blood pressure 119/78, pulse 63, temperature 98.3 F (36.8 C), height 5' 5.5" (1.664 m), weight 170 lb (77.1 kg), SpO2 100 %. Body mass index is 27.86 kg/m. General: Well Developed, well nourished, and in no acute distress.  Neuro: Alert and oriented x3, extra-ocular muscles intact, sensation grossly intact.  HEENT:[place]/AT, PERRLA, neck supple, No carotid bruits Skin: no gross rashes  Cardiac: Regular rate and rhythm Respiratory: Essentially clear to auscultation bilaterally. Not using accessory muscles, speaking in full sentences.  Abdominal: not grossly distended Musculoskeletal: Ambulates w/o diff, FROM * 4 ext.  Vasc: less 2 sec cap RF, warm and pink  Psych:  No HI/SI, judgement and insight good, Euthymic mood. Full Affect.    Recent Results (from the past 2160 hour(s))  Hepatic function panel     Status: None   Collection Time: 08/24/18 11:12 AM  Result Value Ref Range   Total Bilirubin 0.7 0.2 - 1.2 mg/dL   Bilirubin, Direct 0.1 0.0 - 0.3 mg/dL   Alkaline Phosphatase 80 39 - 117 U/L   AST 18 0 - 37 U/L  ALT 17 0 - 35 U/L   Total Protein 6.9 6.0 - 8.3 g/dL   Albumin 4.5 3.5 - 5.2  g/dL  Lipid panel     Status: Abnormal   Collection Time: 08/24/18 11:12 AM  Result Value Ref Range   Cholesterol 219 (H) 0 - 200 mg/dL    Comment: ATP III Classification       Desirable:  < 200 mg/dL               Borderline High:  200 - 239 mg/dL          High:  > = 240 mg/dL   Triglycerides 79.0 0.0 - 149.0 mg/dL    Comment: Normal:  <150 mg/dLBorderline High:  150 - 199 mg/dL   HDL 56.20 >39.00 mg/dL   VLDL 15.8 0.0 - 40.0 mg/dL   LDL Cholesterol 147 (H) 0 - 99 mg/dL   Total CHOL/HDL Ratio 4     Comment:                Men          Women1/2 Average Risk     3.4          3.3Average Risk          5.0          4.42X Average Risk          9.6          7.13X Average Risk          15.0          11.0                       NonHDL 162.40     Comment: NOTE:  Non-HDL goal should be 30 mg/dL higher than patient's LDL goal (i.e. LDL goal of < 70 mg/dL, would have non-HDL goal of < 100 mg/dL)  CBC with Differential/Platelet     Status: Abnormal   Collection Time: 08/24/18 11:12 AM  Result Value Ref Range   WBC 4.9 4.0 - 10.5 K/uL   RBC 4.13 3.87 - 5.11 Mil/uL   Hemoglobin 12.8 12.0 - 15.0 g/dL   HCT 38.9 36.0 - 46.0 %   MCV 94.3 78.0 - 100.0 fl   MCHC 33.0 30.0 - 36.0 g/dL   RDW 13.4 11.5 - 15.5 %   Platelets 265.0 150.0 - 400.0 K/uL   Neutrophils Relative % 41.6 (L) 43.0 - 77.0 %   Lymphocytes Relative 44.4 12.0 - 46.0 %   Monocytes Relative 8.8 3.0 - 12.0 %   Eosinophils Relative 4.7 0.0 - 5.0 %   Basophils Relative 0.5 0.0 - 3.0 %   Neutro Abs 2.1 1.4 - 7.7 K/uL   Lymphs Abs 2.2 0.7 - 4.0 K/uL   Monocytes Absolute 0.4 0.1 - 1.0 K/uL   Eosinophils Absolute 0.2 0.0 - 0.7 K/uL   Basophils Absolute 0.0 0.0 - 0.1 K/uL  TSH     Status: None   Collection Time: 08/24/18 11:12 AM  Result Value Ref Range   TSH 1.85 0.35 - 4.50 uIU/mL  Basic metabolic panel     Status: None   Collection Time: 08/24/18 11:12 AM  Result Value Ref Range   Sodium 138 135 - 145 mEq/L   Potassium 4.1 3.5 - 5.1  mEq/L   Chloride 103 96 - 112 mEq/L   CO2 27 19 - 32 mEq/L   Glucose, Bld 92 70 - 99 mg/dL  BUN 14 6 - 23 mg/dL   Creatinine, Ser 0.73 0.40 - 1.20 mg/dL   Calcium 9.4 8.4 - 10.5 mg/dL   GFR 84.61 >60.00 mL/min  POCT urinalysis dipstick     Status: None   Collection Time: 08/24/18  3:10 PM  Result Value Ref Range   Color, UA yellow    Clarity, UA clear    Glucose, UA Negative Negative   Bilirubin, UA n    Ketones, UA n    Spec Grav, UA 1.015 1.010 - 1.025   Blood, UA n    pH, UA 6.0 5.0 - 8.0   Protein, UA Negative Negative   Urobilinogen, UA 0.2 0.2 or 1.0 E.U./dL   Nitrite, UA n    Leukocytes, UA Negative Negative   Appearance clear    Odor n

## 2018-10-07 NOTE — Patient Instructions (Addendum)
- Veronica Barrett, pt would like a copy of her most recent labwork- thnx   Please realize, EXERCISE IS MEDICINE!  -  American Heart Association [facility]) guidelines for exercise : If you are in good health, without any medical conditions, you should engage in 150-300 minutes of moderate intensity aerobic activity per week.  This means you should be huffing and puffing throughout your workout.   Engaging in regular exercise will improve brain function and memory, as well as improve mood, boost immune system and help with weight management.  As well as the other, more well-known effects of exercise such as decreasing blood sugar levels, decreasing blood pressure,  and decreasing bad cholesterol levels/ increasing good cholesterol levels.     -  The AHA strongly endorses consumption of a diet that contains a variety of foods from all the food categories with an emphasis on fruits and vegetables; fat-free and low-fat dairy products; cereal and grain products; legumes and nuts; and fish, poultry, and/or extra lean meats.    Excessive food intake, especially of foods high in saturated and trans fats, sugar, and salt, should be avoided.    Adequate water intake of roughly 1/2 of your weight in pounds, should equal the ounces of water per day you should drink.  So for instance, if you're 200 pounds, that would be 100 ounces of water per day.         Mediterranean Diet  Why follow it? Research shows  Those who follow the Mediterranean diet have a reduced risk of heart disease   The diet is associated with a reduced incidence of Parkinson's and Alzheimer's diseases  People following the diet may have longer life expectancies and lower rates of chronic diseases   The Dietary Guidelines for Americans recommends the Mediterranean diet as an eating plan to promote health and prevent disease  What Is the Mediterranean Diet?   Healthy eating plan based on typical foods and recipes of Mediterranean-style  cooking  The diet is primarily a plant based diet; these foods should make up a majority of meals   Starches - Plant based foods should make up a majority of meals - They are an important sources of vitamins, minerals, energy, antioxidants, and fiber - Choose whole grains, foods high in fiber and minimally processed items  - Typical grain sources include wheat, oats, barley, corn, brown rice, bulgar, farro, millet, polenta, couscous  - Various types of beans include chickpeas, lentils, fava beans, black beans, white beans   Fruits  Veggies - Large quantities of antioxidant rich fruits & veggies; 6 or more servings  - Vegetables can be eaten raw or lightly drizzled with oil and cooked  - Vegetables common to the traditional Mediterranean Diet include: artichokes, arugula, beets, broccoli, brussel sprouts, cabbage, carrots, celery, collard greens, cucumbers, eggplant, kale, leeks, lemons, lettuce, mushrooms, okra, onions, peas, peppers, potatoes, pumpkin, radishes, rutabaga, shallots, spinach, sweet potatoes, turnips, zucchini - Fruits common to the Mediterranean Diet include: apples, apricots, avocados, cherries, clementines, dates, figs, grapefruits, grapes, melons, nectarines, oranges, peaches, pears, pomegranates, strawberries, tangerines  Fats - Replace butter and margarine with healthy oils, such as olive oil, canola oil, and tahini  - Limit nuts to no more than a handful a day  - Nuts include walnuts, almonds, pecans, pistachios, pine nuts  - Limit or avoid candied, honey roasted or heavily salted nuts - Olives are central to the Mediterranean diet - can be eaten whole or used in a variety of dishes  Meats Protein - Limiting red meat: no more than a few times a month - When eating red meat: choose lean cuts and keep the portion to the size of deck of cards - Eggs: approx. 0 to 4 times a week  - Fish and lean poultry: at least 2 a week  - Healthy protein sources include, chicken, [place],  lean beef, lamb - Increase intake of seafood such as tuna, salmon, trout, mackerel, shrimp, scallops - Avoid or limit high fat processed meats such as sausage and bacon  Dairy - Include moderate amounts of low fat dairy products  - Focus on healthy dairy such as fat free yogurt, skim milk, low or reduced fat cheese - Limit dairy products higher in fat such as whole or 2% milk, cheese, ice cream  Alcohol - Moderate amounts of red wine is ok  - No more than 5 oz daily for women (all ages) and men older than age 38  - No more than 10 oz of wine daily for men younger than 17  Other - Limit sweets and other desserts  - Use herbs and spices instead of salt to flavor foods  - Herbs and spices common to the traditional Mediterranean Diet include: basil, bay leaves, chives, cloves, cumin, fennel, garlic, lavender, marjoram, mint, oregano, parsley, pepper, rosemary, sage, savory, sumac, tarragon, thyme   Its not just a diet, its a lifestyle:   The Mediterranean diet includes lifestyle factors typical of those in the region   Foods, drinks and meals are best eaten with others and savored  Daily physical activity is important for overall good health  This could be strenuous exercise like running and aerobics  This could also be more leisurely activities such as walking, housework, yard-work, or taking the stairs  Moderation is the key; a balanced and healthy diet accommodates most foods and drinks  Consider portion sizes and frequency of consumption of certain foods   Meal Ideas & Options:   Breakfast:  o Whole wheat toast or whole wheat English muffins with peanut butter & hard boiled egg o Steel cut oats topped with apples & cinnamon and skim milk  o Fresh fruit: banana, strawberries, melon, berries, peaches  o Smoothies: strawberries, bananas, greek yogurt, peanut butter o Low fat greek yogurt with blueberries and granola  o Egg white omelet with spinach and mushrooms o Breakfast  couscous: whole wheat couscous, apricots, skim milk, cranberries   Sandwiches:  o Hummus and grilled vegetables (peppers, zucchini, squash) on whole wheat bread   o Grilled chicken on whole wheat pita with lettuce, tomatoes, cucumbers or tzatziki  o Tuna salad on whole wheat bread: tuna salad made with greek yogurt, olives, red peppers, capers, green onions o Garlic rosemary lamb pita: lamb sauted with garlic, rosemary, salt & pepper; add lettuce, cucumber, greek yogurt to pita - flavor with lemon juice and black pepper   Seafood:  o Mediterranean grilled salmon, seasoned with garlic, basil, parsley, lemon juice and black pepper o Shrimp, lemon, and spinach whole-grain pasta salad made with low fat greek yogurt  o Seared scallops with lemon orzo  o Seared tuna steaks seasoned salt, pepper, coriander topped with tomato mixture of olives, tomatoes, olive oil, minced garlic, parsley, green onions and cappers   Meats:  o Herbed greek chicken salad with kalamata olives, cucumber, feta  o Red bell peppers stuffed with spinach, bulgur, lean ground beef (or lentils) & topped with feta   o Kebabs: skewers of chicken, tomatoes, onions,  zucchini, squash  o [place] burgers: made with red onions, mint, dill, lemon juice, feta cheese topped with roasted red peppers  Vegetarian o Cucumber salad: cucumbers, artichoke hearts, celery, red onion, feta cheese, tossed in olive oil & lemon juice  o Hummus and whole grain pita points with a greek salad (lettuce, tomato, feta, olives, cucumbers, red onion) o Lentil soup with celery, carrots made with vegetable broth, garlic, salt and pepper  o Tabouli salad: parsley, bulgur, mint, scallions, cucumbers, tomato, radishes, lemon juice, olive oil, salt and pepper.    Guidelines for a Low Cholesterol, Low Saturated Fat Diet   Fats - Limit total intake of fats and oils. - Avoid butter, stick margarine, shortening, lard, palm and coconut oils. - Limit mayonnaise,  salad dressings, gravies and sauces, unless they are homemade with low-fat ingredients. - Limit chocolate. - Choose low-fat and nonfat products, such as low-fat mayonnaise, low-fat or non-hydrogenated peanut butter, low-fat or fat-free salad dressings and nonfat gravy. - Use vegetable oil, such as canola or olive oil. - Look for margarine that does not contain trans fatty acids. - Use nuts in moderate amounts. - Read ingredient labels carefully to determine both amount and type of fat present in foods. Limit saturated and trans fats! - Avoid high-fat processed and convenience foods.  Meats and Meat Alternatives - Choose fish, chicken, [place] and lean meats. - Use dried beans, peas, lentils and tofu. - Limit egg yolks to three to four per week. - If you eat red meat, limit to no more than three servings per week and choose loin or round cuts. - Avoid fatty meats, such as bacon, sausage, franks, luncheon meats and ribs. - Avoid all organ meats, including liver.  Dairy - Choose nonfat or low-fat milk, yogurt and cottage cheese. - Most cheeses are high in fat. Choose cheeses made from non-fat milk, such as mozzarella and ricotta cheese. - Choose light or fat-free cream cheese and sour cream. - Avoid cream and sauces made with cream.  Fruits and Vegetables - Eat a wide variety of fruits and vegetables. - Use lemon juice, vinegar or "mist" olive oil on vegetables. - Avoid adding sauces, fat or oil to vegetables.  Breads, Cereals and Grains - Choose whole-grain breads, cereals, pastas and rice. - Avoid high-fat snack foods, such as granola, cookies, pies, pastries, doughnuts and croissants.  Cooking Tips - Avoid deep fried foods. - Trim visible fat off meats and remove skin from poultry before cooking. - Bake, broil, boil, poach or roast poultry, fish and lean meats. - Drain and discard fat that drains out of meat as you cook it. - Add little or no fat to foods. - Use vegetable oil  sprays to grease pans for cooking or baking. - Steam vegetables. - Use herbs or no-oil marinades to flavor foods.

## 2018-12-24 DIAGNOSIS — H04123 Dry eye syndrome of bilateral lacrimal glands: Secondary | ICD-10-CM | POA: Diagnosis not present

## 2019-01-21 ENCOUNTER — Ambulatory Visit
Admission: EM | Admit: 2019-01-21 | Discharge: 2019-01-21 | Disposition: A | Discharge status: Home / Self Care | Payer: Medicare Other | Attending: Family Medicine | Admitting: Family Medicine

## 2019-01-21 ENCOUNTER — Encounter: Payer: Self-pay | Admitting: Emergency Medicine

## 2019-01-21 DIAGNOSIS — L249 Irritant contact dermatitis, unspecified cause: Secondary | ICD-10-CM

## 2019-01-21 MED ORDER — PREDNISONE 10 MG (21) PO TBPK: ORAL_TABLET | Freq: Every day | ORAL | 0 refills | Status: DC

## 2019-01-21 MED ORDER — METHYLPREDNISOLONE SODIUM SUCC 125 MG IJ SOLR
125.0000 mg | Freq: Once | INTRAMUSCULAR | Status: AC
  Administered 2019-01-21: 125 mg via INTRAMUSCULAR

## 2019-01-21 NOTE — ED Notes (Signed)
Patient able to ambulate independently  

## 2019-01-21 NOTE — ED Triage Notes (Signed)
Pt presents to [facility] for rash to left face and right arm x 2 days, known exposure to poison ivy.

## 2019-01-21 NOTE — ED Provider Notes (Signed)
[facility]    CSN: [id number] Arrival date & time: 01/21/19  1012     History   Chief Complaint Chief Complaint  Patient presents with  . Rash    HPI Veronica Barrett is a 68 y.o. female history of allergic rhinitis, hyperlipidemia, presenting today for evaluation of a rash.  Patient states that for the past 2 days she has started to break out into a rash on her right upper extremity as well as on her face.  Rash is been associated with significant itching.  She states that she has been working out in her yard for the past 3 days and believes she was exposed to poison ivy.  She denies changes in vision.  Denies difficulty breathing.  Denies lesions in mouth.  She has been applying calamine with minimal relief of itching.  She is already taking a daily Claritin.  Denies any new soaps, lotions, detergents, hygiene products.  Denies new foods or medicines.  HPI  Past Medical History:  Diagnosis Date  . Allergy    seasonal allergy  . Arthritis    left hip  . Depression   . HSV infection    vag    Patient Active Problem List   Diagnosis Date Noted  . Environmental and seasonal allergies 10/07/2018  . Chronic sinusitis 10/07/2018  . H/O sinus surgery 10/07/2018  . Hyperlipidemia 10/07/2018  . S/P left THA, AA 10/17/2014  . Recurrent HSV (herpes simplex virus) 04/21/2013  . h/o Depression 02/20/2010  . Allergic rhinitis 04/16/2007    Past Surgical History:  Procedure Laterality Date  . APPENDECTOMY  1984  . EYE SURGERY Left    left vitreous  . TOTAL HIP ARTHROPLASTY Left 10/17/2014   Procedure: LEFT TOTAL HIP ARTHROPLASTY ANTERIOR APPROACH;  Surgeon: Mauri Pole, MD;  Location: [facility];  Service: Orthopedics;  Laterality: Left;    OB History   No obstetric history on file.      Home Medications    Prior to Admission medications   Medication Sig Start Date End Date Taking? Authorizing Provider  citalopram (CELEXA) 20 MG tablet TAKE 1 AND 1/2 TABLETS BY  MOUTH AT BEDTIME 08/24/18  Yes [name], MD  loratadine (CLARITIN) 10 MG tablet Take 10 mg by mouth daily.   Yes [provider]  acyclovir (ZOVIRAX) 200 MG capsule Take 1 capsule (200 mg total) by mouth 5 (five) times daily as needed (breakouts). 08/24/18   [name], MD  albuterol (PROVENTIL HFA;VENTOLIN HFA) 108 (90 Base) MCG/ACT inhaler Inhale 2 puffs into the lungs every 6 (six) hours as needed for wheezing or shortness of breath. 03/22/18   [name], MD  predniSONE (STERAPRED UNI-PAK 21 TAB) 10 MG (21) TBPK tablet Take by mouth daily. Take 6 tabs by mouth daily  for 2 days, then 5 tabs for 2 days, then 4 tabs for 2 days, then 3 tabs for 2 days, 2 tabs for 2 days, then 1 tab by mouth daily for 2 days 01/21/19   Janith Lima, PA-C    Family History Family History  Problem Relation Age of Onset  . Cancer Father   . Cancer Sister   . Thyroid cancer Sister   . Arthritis Other   . Colon cancer Neg Hx     Social History Social History   Tobacco Use  . Smoking status: Never Smoker  . Smokeless tobacco: Never Used  Substance Use Topics  . Alcohol use: Yes  Alcohol/week: 10.0 standard drinks    Types: 10 Glasses of wine per week  . Drug use: No     Allergies   Seasonal ic [cholestatin] and Tape   Review of Systems Review of Systems  Constitutional: Negative for fatigue and fever.  HENT: Negative for mouth sores.   Eyes: Negative for visual disturbance.  Respiratory: Negative for shortness of breath.   Cardiovascular: Negative for chest pain.  Gastrointestinal: Negative for abdominal pain, nausea and vomiting.  Genitourinary: Negative for genital sores.  Musculoskeletal: Negative for arthralgias and joint swelling.  Skin: Positive for color change and rash. Negative for wound.  Neurological: Negative for dizziness, weakness, light-headedness and headaches.     Physical Exam Triage Vital Signs ED Triage Vitals [01/21/19 1024]  Enc Vitals  Group     BP 129/83     Pulse Rate (!) 58     Resp 16     Temp 98.4 F (36.9 C)     Temp Source Oral     SpO2 95 %     Weight      Height      Head Circumference      Peak Flow      Pain Score 0     Pain Loc      Pain Edu?      Excl. in [place]?    No data found.  Updated Vital Signs BP 129/83 (BP Location: Left Arm)   Pulse (!) 58   Temp 98.4 F (36.9 C) (Oral)   Resp 16   SpO2 95%   Visual Acuity Right Eye Distance:   Left Eye Distance:   Bilateral Distance:    Right Eye Near:   Left Eye Near:    Bilateral Near:     Physical Exam Vitals signs and nursing note reviewed.  Constitutional:      Appearance: She is well-developed.     Comments: No acute distress  HENT:     Head: Normocephalic and atraumatic.     Nose: Nose normal.     Mouth/Throat:     Comments: Oral mucosa pink and moist, no tonsillar enlargement or exudate. Posterior pharynx patent and nonerythematous, no uvula deviation or swelling. Normal phonation. No lesions on oral mucosa Eyes:     Conjunctiva/sclera: Conjunctivae normal.  Neck:     Musculoskeletal: Neck supple.  Cardiovascular:     Rate and Rhythm: Normal rate.  Pulmonary:     Effort: Pulmonary effort is normal. No respiratory distress.     Comments: Breathing comfortably at rest, CTABL, no wheezing, rales or other adventitious sounds auscultated Abdominal:     General: There is no distension.  Musculoskeletal: Normal range of motion.  Skin:    General: Skin is warm and dry.     Comments: Right arm with linear vesicular-like erythematous lesions  Face with macular papular erythematous area to left cheek and upper neck area, no lesions around ocular area  Neurological:     Mental Status: She is alert and oriented to person, place, and time.      UC Treatments / Results  Labs (all labs ordered are listed, but only abnormal results are displayed) Labs Reviewed - No data to display  EKG None  Radiology No results  found.  Procedures Procedures (including critical care time)  Medications Ordered in UC Medications  methylPREDNISolone sodium succinate (SOLU-MEDROL) 125 mg/2 mL injection 125 mg (125 mg Intramuscular Given 01/21/19 1041)    Initial Impression / Assessment and Plan /  UC Course  I have reviewed the triage vital signs and the nursing notes.  Pertinent labs & imaging results that were available during my care of the patient were reviewed by me and considered in my medical decision making (see chart for details).     Rash does appear to be a contact dermatitis/poison ivy rash given vesicular distribution on arm.  Relatively confined to right arm and face at this time, but given on face we will go ahead and provide injection of Solu-Medrol in hopes to prevent spread closer to the eyes.  Will continue on steroid taper.  Continue antihistamines, calamine, oatmeal baths.  Continue to monitor,Discussed strict return precautions. Patient verbalized understanding and is agreeable with plan.  Final Clinical Impressions(s) / UC Diagnoses   Final diagnoses:  Irritant contact dermatitis, unspecified trigger     Discharge Instructions     We gave you an injection of Solu-Medrol today in attempt to prevent the spread from spreading more on your face Please begin prednisone taper either this evening or tomorrow morning beginning with 6 tablets for 2 days, 5 tablets for 2 days, 4 tablets for 2 days, 3 tablets for 2 days 2 tablets for 2 days and 1 tablet for 2 days.  You may continue to use calamine lotion and try oatmeal baths to further help soothe itching  May use antihistamines like Zyrtec or Claritin during the day, Benadryl at nighttime  Follow-up if rash spreading closer to eyes, developing difficulty breathing, rash changing    ED Prescriptions    Medication Sig Dispense Auth. Provider   predniSONE (STERAPRED UNI-PAK 21 TAB) 10 MG (21) TBPK tablet Take by mouth daily. Take 6 tabs by mouth  daily  for 2 days, then 5 tabs for 2 days, then 4 tabs for 2 days, then 3 tabs for 2 days, 2 tabs for 2 days, then 1 tab by mouth daily for 2 days 42 tablet Dominigue Gellner C, PA-C     Controlled Substance Prescriptions  Controlled Substance Registry consulted? Not Applicable   Janith Lima, [place] 01/21/19 1104

## 2019-01-21 NOTE — Discharge Instructions (Signed)
We gave you an injection of Solu-Medrol today in attempt to prevent the spread from spreading more on your face Please begin prednisone taper either this evening or tomorrow morning beginning with 6 tablets for 2 days, 5 tablets for 2 days, 4 tablets for 2 days, 3 tablets for 2 days 2 tablets for 2 days and 1 tablet for 2 days.  You may continue to use calamine lotion and try oatmeal baths to further help soothe itching  May use antihistamines like Zyrtec or Claritin during the day, Benadryl at nighttime  Follow-up if rash spreading closer to eyes, developing difficulty breathing, rash changing

## 2019-02-01 DIAGNOSIS — I10 Essential (primary) hypertension: Secondary | ICD-10-CM | POA: Diagnosis not present

## 2019-02-01 DIAGNOSIS — R51 Headache: Secondary | ICD-10-CM | POA: Diagnosis not present

## 2019-02-01 DIAGNOSIS — Z79899 Other long term (current) drug therapy: Secondary | ICD-10-CM | POA: Diagnosis not present

## 2019-02-01 DIAGNOSIS — Z96642 Presence of left artificial hip joint: Secondary | ICD-10-CM | POA: Diagnosis not present

## 2019-02-01 DIAGNOSIS — F329 Major depressive disorder, single episode, unspecified: Secondary | ICD-10-CM | POA: Diagnosis not present

## 2019-02-01 DIAGNOSIS — J45909 Unspecified asthma, uncomplicated: Secondary | ICD-10-CM | POA: Diagnosis not present

## 2019-03-08 ENCOUNTER — Ambulatory Visit: Payer: Medicare Other | Admitting: Family Medicine

## 2019-05-04 ENCOUNTER — Ambulatory Visit: Payer: Medicare Other | Admitting: Family Medicine

## 2019-09-19 DIAGNOSIS — Z23 Encounter for immunization: Secondary | ICD-10-CM | POA: Diagnosis not present

## 2019-10-03 DIAGNOSIS — Z01419 Encounter for gynecological examination (general) (routine) without abnormal findings: Secondary | ICD-10-CM | POA: Diagnosis not present

## 2019-10-03 DIAGNOSIS — Z124 Encounter for screening for malignant neoplasm of cervix: Secondary | ICD-10-CM | POA: Diagnosis not present

## 2019-10-03 DIAGNOSIS — Z6827 Body mass index (BMI) 27.0-27.9, adult: Secondary | ICD-10-CM | POA: Diagnosis not present

## 2019-10-03 DIAGNOSIS — R87610 Atypical squamous cells of undetermined significance on cytologic smear of cervix (ASC-US): Secondary | ICD-10-CM | POA: Diagnosis not present

## 2019-11-21 ENCOUNTER — Ambulatory Visit: Payer: Medicare Other | Attending: Internal Medicine

## 2019-11-21 DIAGNOSIS — Z20822 Contact with and (suspected) exposure to covid-19: Secondary | ICD-10-CM

## 2019-11-22 LAB — NOVEL CORONAVIRUS, NAA: SARS-CoV-2, NAA: NOT DETECTED

## 2019-12-22 ENCOUNTER — Other Ambulatory Visit: Payer: Self-pay | Admitting: Family Medicine

## 2019-12-22 ENCOUNTER — Telehealth: Payer: Self-pay | Admitting: Family Medicine

## 2019-12-22 NOTE — Telephone Encounter (Signed)
Patient called states her prior PCP gv this Rx  : (she is almost out)  citalopram (CELEXA) 20 MG tablet [id number]   Order Details Dose, Route, Frequency: As Directed  Dispense Quantity: 150 tablet Refills: 3       Sig: TAKE 1 AND 1/2 TABLETS BY MOUTH AT BEDTIME   --Pt uses :   [id number] [address], [place] [address] [phone] (Phone) [phone] (Fax   --glh

## 2019-12-22 NOTE — Telephone Encounter (Signed)
The patient has not been seen by Opalski since 09/2018 and would need an appointment to discuss with provider. Please see patient last note and the follow up instructions from that date. Thank you. AS, CMA

## 2019-12-25 ENCOUNTER — Ambulatory Visit: Payer: Medicare Other | Attending: Internal Medicine

## 2019-12-25 DIAGNOSIS — Z23 Encounter for immunization: Secondary | ICD-10-CM | POA: Insufficient documentation

## 2019-12-25 NOTE — Progress Notes (Signed)
   Covid-19 Vaccination Clinic  Name:  Veronica Barrett    MRN: [id number] DOB: [birth year]  12/25/2019  [name] was observed post Covid-19 immunization for 15 minutes without incidence. She was provided with Vaccine Information Sheet and instruction to access the V-Safe system.   [name] was instructed to call 911 with any severe reactions post vaccine: Marland Kitchen Difficulty breathing  . Swelling of your face and throat  . A fast heartbeat  . A bad rash all over your body  . Dizziness and weakness    Immunizations Administered    Name Date Dose VIS Date Route   Pfizer COVID-19 Vaccine 12/25/2019  3:46 PM 0.3 mL 10/28/2019 Intramuscular   Manufacturer: [place]   Lot: [id number]   [place]: [id number]

## 2019-12-30 ENCOUNTER — Other Ambulatory Visit: Payer: Self-pay | Admitting: Family Medicine

## 2020-01-02 ENCOUNTER — Encounter: Payer: Self-pay | Admitting: Family Medicine

## 2020-01-02 ENCOUNTER — Other Ambulatory Visit: Payer: Self-pay

## 2020-01-02 ENCOUNTER — Ambulatory Visit (INDEPENDENT_AMBULATORY_CARE_PROVIDER_SITE_OTHER): Payer: Medicare Other | Admitting: Family Medicine

## 2020-01-02 VITALS — BP 106/76 | Temp 98.6°F | Ht 65.0 in | Wt 170.0 lb

## 2020-01-02 DIAGNOSIS — F339 Major depressive disorder, recurrent, unspecified: Secondary | ICD-10-CM | POA: Diagnosis not present

## 2020-01-02 DIAGNOSIS — E785 Hyperlipidemia, unspecified: Secondary | ICD-10-CM

## 2020-01-02 MED ORDER — CITALOPRAM HYDROBROMIDE 20 MG PO TABS: ORAL_TABLET | ORAL | 0 refills | Status: DC

## 2020-01-02 NOTE — Progress Notes (Signed)
Telehealth office visit note for Veronica Barrett, D.O- at Primary Care at [facility]   I connected with current patient today and verified that I am speaking with the correct person using two identifiers.   . Location of the patient: Home . Location of the provider: Office Only the patient (+/- their family members at pt's discretion) and myself were participating in the encounter - This visit type was conducted due to national recommendations for restrictions regarding the COVID-19 Pandemic (e.g. social distancing) in an effort to limit this patient's exposure and mitigate transmission in our community.  This format is felt to be most appropriate for this patient at this time.   - No physical exam could be performed with this format, beyond that communicated to [place] by the patient/ family members as noted.   - Additionally my office staff/ schedulers discussed with the patient that there may be a monetary charge related to this service, depending on their medical insurance.   The patient expressed understanding, and agreed to proceed.       History of Present Illness: No chief complaint on file.   I, Veronica Barrett, am serving as Education administrator for [employer].  Notes only cancelled her historical appointment due to [employer].  She has been to a gynecologist in the past through [facility], and unsure who she is currently seeing after several changes in providers.  - Mood Management, Depression Notes she has a prescription of Citalopram and consistently takes this at 30 mg per day.  Per patient, last refill was provided in 9 of 2020, with 150 in it.  Notes "I didn't run out in mid-December;" she has six whole pills left, and usually takes one and a half each day.  Feels her mood is okay as long as she takes her medication; "but if I don't take it, I very quickly feel bad, especially this time of year."  Believes she experiences more depression than anxiety.  Notes she went through  a lot with Tom and his medical history, and when he was stable and well, that's when she became depressed.  She's been taking Citalopram for 5-6 years.  Notes lately, she's helping out with her elderly mother in [place].  Her brother had emergency surgery last week and the patient was the only person able to go be with him and help with him.  States "there's always a lot going on."  - Exercise, Lifestyle Loves being outside in the spring and summer and did a lot of exercise during these seasons.  Notes that Gershon Mussel is now doing some balance exercises, which she does along with him.  Notes "I told him that if it's not raining this afternoon, we need to walk and step this up some."  Knows she needs to be more dedicated to exercising.  Feels she gained some weight at the beginning of COVID, but is now back down to 170 lbs.  She is hoping to lose a bit more weight.    No flowsheet data found.  Depression screen [facility] 2/9 01/02/2020 10/07/2018 08/24/2018  Decreased Interest 0 0 0  Down, Depressed, Hopeless 0 0 0  PHQ - 2 Score 0 0 0  Altered sleeping 0 0 -  Tired, decreased energy 0 0 -  Change in appetite 0 0 -  Feeling bad or failure about yourself  0 0 -  Trouble concentrating 0 0 -  Moving slowly or fidgety/restless 0 0 -  Suicidal thoughts 0  0 -  PHQ-9 Score 0 0 -      Impression and Recommendations:    1. Depression, recurrent ([place])   2. Hyperlipidemia, unspecified hyperlipidemia type      - Last seen 11 of 2019   - Last labs obtained in 10 of 2019 with former provider, and cholesterol, blood counts were abnormal. - Discussed critical need for repeat blood work near future with CPE    Depression, Recurrent - Patient is managed on Citalopram, 30 mg daily.  - Last refill of Citalopram on record filled by historical provider in 10 of 2019, not since.    - Per patient, 90 day supply was provided in 9 of 2020 by Dr. Stevie Kern, with 150 pills.  - Per patient, stable on  current treatment without S-E. - Continue management as prescribed.  - Discussed critical need for follow-up health maintenance visit before continued refills.  - Will continue to monitor.   Lifestyle & Preventative Health Maintenance - Advised patient to continue working toward exercising and maintenance of healthy weight to improve overall mental, physical, and emotional health.    - Reviewed the "spokes of the wheel" of mood and health management.  Stressed the importance of ongoing prudent habits, including regular exercise, appropriate sleep hygiene, healthful dietary habits, and prayer/meditation to relax.  - Encouraged patient to engage in daily physical activity as tolerated, especially a formal exercise routine.  Recommended that the patient eventually strive for at least 150 minutes of moderate cardiovascular activity per week according to guidelines established by the [facility].   - Healthy dietary habits encouraged, including low-carb, and high amounts of lean protein in diet.   - Patient should also consume adequate amounts of water.  - Health counseling performed.  All questions answered.   Recommendations - Return within the next 90 days for wellness visit, full fasting blood work.  - Additionally, discussion had with patient regarding our treatment plan, and their biases/concerns about that plan were used in my medical decision making today.    - The patient agreed with the plan and demonstrated an understanding of the instructions.   No barriers to understanding were identified.    Return for medicare wellness [facility]) visit near future with full fasting lab work same day, PLUS told to make OV 2-3 days after [facility] to discuss [place]!   Meds ordered this encounter  Medications  . citalopram (CELEXA) 20 MG tablet    Sig: TAKE 1 AND 1/2 TAB BY MOUTH AT BEDTIME(No RF UNTIL SEEN)    Dispense:  90 tablet    Refill:  0    Medications Discontinued During This Encounter    Medication Reason  . predniSONE (STERAPRED UNI-PAK 21 TAB) 10 MG (21) TBPK tablet Error  . citalopram (CELEXA) 20 MG tablet Reorder     I provided 17+ minutes of non face-to-face time during this encounter.  Additional time was spent with charting and coordination of care before and after the actual visit commenced.   Note:  This note was prepared with assistance of Dragon voice recognition software. Occasional wrong-word or sound-a-like substitutions may have occurred due to the inherent limitations of voice recognition software.   This document serves as a record of services personally performed by Veronica Dance, DO. It was created on her behalf by Veronica Barrett, a trained medical scribe. The creation of this record is based on the scribe's personal observations and the provider's statements to them.   This case required medical decision making  of at least moderate complexity.  This document serves as a record of services personally performed by Veronica Dance, DO. It was created on her behalf by Veronica Barrett, a trained medical scribe. The creation of this record is based on the scribe's personal observations and the provider's statements to them.   This case required medical decision making of at least moderate complexity. The above documentation from Veronica Barrett, medical scribe, has been reviewed by Marjory Sneddon, D.O.        Patient Care Team    Relationship Specialty Notifications Start End  Veronica Dance, DO PCP - General Family Medicine  10/07/18   Paralee Cancel, MD Consulting Physician Orthopedic Surgery  10/07/18   Camillo Flaming, [place] Referring Physician Optometry  10/07/18   Ob/Gyn, [facility]    01/02/20      -Vitals obtained; medications/ allergies reconciled;  personal medical, social, Sx etc.histories were updated by CMA, reviewed by me and are reflected in chart   Patient Active Problem List   Diagnosis Date Noted  . Environmental  and seasonal allergies 10/07/2018  . Chronic sinusitis 10/07/2018  . H/O sinus surgery 10/07/2018  . Hyperlipidemia 10/07/2018  . S/P left THA, AA 10/17/2014  . Recurrent HSV (herpes simplex virus) 04/21/2013  . Depression, recurrent ([place]) 02/20/2010  . Allergic rhinitis 04/16/2007     Current Meds  Medication Sig  . acyclovir (ZOVIRAX) 200 MG capsule Take 1 capsule (200 mg total) by mouth 5 (five) times daily as needed (breakouts).  Marland Kitchen albuterol (PROVENTIL HFA;VENTOLIN HFA) 108 (90 Base) MCG/ACT inhaler Inhale 2 puffs into the lungs every 6 (six) hours as needed for wheezing or shortness of breath.  . citalopram (CELEXA) 20 MG tablet TAKE 1 AND 1/2 TAB BY MOUTH AT BEDTIME(No RF UNTIL SEEN)  . loratadine (CLARITIN) 10 MG tablet Take 10 mg by mouth daily.  . [DISCONTINUED] citalopram (CELEXA) 20 MG tablet TAKE 1 AND 1/2 TABLETS BY MOUTH AT BEDTIME     Allergies:  Allergies  Allergen Reactions  . Seasonal Ic [Cholestatin]   . Tape Rash     ROS:  See above HPI for pertinent positives and negatives   Objective:   Blood pressure 106/76, temperature 98.6 F (37 C), height 5\' 5"  (1.651 m), weight 170 lb (77.1 kg).  (if some vitals are omitted, this means that patient was UNABLE to obtain them even though they were asked to get them prior to OV today.  They were asked to call us at their earliest convenience with these once obtained. )  General: A & O * 3; sounds in no acute distress; in usual state of health.  Skin: Pt confirms warm and dry extremities and pink fingertips HEENT: Pt confirms lips non-cyanotic Chest: Patient confirms normal chest excursion and movement Respiratory: speaking in full sentences, no conversational dyspnea; patient confirms no use of accessory muscles Psych: insight appears good, mood- appears full

## 2020-01-12 ENCOUNTER — Ambulatory Visit: Payer: Medicare Other

## 2020-01-19 ENCOUNTER — Ambulatory Visit: Payer: Medicare Other | Attending: Internal Medicine

## 2020-01-19 DIAGNOSIS — Z23 Encounter for immunization: Secondary | ICD-10-CM

## 2020-01-19 NOTE — Progress Notes (Signed)
   Covid-19 Vaccination Clinic  Name:  Veronica Barrett    MRN: [id number] DOB: 1951/05/10  01/19/2020  Ms. Veronica Barrett was observed post Covid-19 immunization for 15 minutes without incident. She was provided with Vaccine Information Sheet and instruction to access the V-Safe system.   [name] was instructed to call 911 with any severe reactions post vaccine: Marland Kitchen Difficulty breathing  . Swelling of face and throat  . A fast heartbeat  . A bad rash all over body  . Dizziness and weakness   Immunizations Administered    Name Date Dose VIS Date Route   Pfizer COVID-19 Vaccine 01/19/2020 10:31 AM 0.3 mL 10/28/2019 Intramuscular   Manufacturer: [place]   Lot: [id number]   Nadine: [id number]

## 2020-02-14 ENCOUNTER — Other Ambulatory Visit: Payer: Medicare Other

## 2020-02-14 ENCOUNTER — Encounter: Payer: Self-pay | Admitting: Family Medicine

## 2020-02-14 ENCOUNTER — Ambulatory Visit (INDEPENDENT_AMBULATORY_CARE_PROVIDER_SITE_OTHER): Payer: Medicare Other | Admitting: Family Medicine

## 2020-02-14 ENCOUNTER — Other Ambulatory Visit: Payer: Self-pay

## 2020-02-14 VITALS — BP 119/98 | HR 59 | Ht 65.5 in | Wt 170.0 lb

## 2020-02-14 DIAGNOSIS — Z23 Encounter for immunization: Secondary | ICD-10-CM | POA: Diagnosis not present

## 2020-02-14 DIAGNOSIS — Z1231 Encounter for screening mammogram for malignant neoplasm of breast: Secondary | ICD-10-CM | POA: Diagnosis not present

## 2020-02-14 DIAGNOSIS — E2839 Other primary ovarian failure: Secondary | ICD-10-CM

## 2020-02-14 DIAGNOSIS — Z0000 Encounter for general adult medical examination without abnormal findings: Secondary | ICD-10-CM

## 2020-02-14 DIAGNOSIS — E785 Hyperlipidemia, unspecified: Secondary | ICD-10-CM

## 2020-02-14 DIAGNOSIS — Z780 Asymptomatic menopausal state: Secondary | ICD-10-CM | POA: Diagnosis not present

## 2020-02-14 MED ORDER — SHINGRIX 50 MCG/0.5ML IM SUSR: 0.5000 mL | Freq: Once | INTRAMUSCULAR | 0 refills | Status: AC

## 2020-02-14 MED ORDER — PNEUMOVAX 23 25 MCG/0.5ML IJ INJ: 0.5000 mL | INJECTION | INTRAMUSCULAR | 0 refills | Status: AC

## 2020-02-14 NOTE — Progress Notes (Signed)
Lab orders only 

## 2020-02-14 NOTE — Progress Notes (Signed)
Subjective:   Veronica Barrett is a 69 y.o. female who presents for Medicare Annual (Subsequent) preventive examination.  HPI: Notes she has hearing difficulties, and was told a while back that she neesd to get a hearing aid. Feels fine when she is alone or in small groups, but states she can't hear when people talk too fast, or when people mumble. Says "it was easier when we weren't wearing masks, of course."  In the past, during her prior evaluation, she was told she needed two hearing aids instead of one, but didn't like the practice she attended because while they were conducting the evaluation, they were also selling the hearing aids themselves. Says "sometimes my ears will pop, and I can hear better." She is unsure, therefore, if her hearing concerns are related more to nerve damage or sinus-related, noting she has trouble with her sinuses, too. Says in her left ear in particular, "there's a movement there, right inside my left ear, that's really 'creepy crawly.' I think it's a nerve, but I can feel it pounding there, and it's right in there, just beside the ear canal." She is unsure if this is a blood vessel or something else.   Her last mammogram was obtained in 10 of 2019. Went back to the gynecologist to obtain her mammogram, but had a bad rash under one of her breasts. -  Notes "it was terrible" and she was told to delay her mammogram at that time. Her rash has now cleared up, but notes "it took forever for it to clear up."   Patient last had a pap smear recently, back about three months ago with her GYN.   Gershon Mussel is a smoker in the house.   Has felt a lot more tired lately, and she is unsure if it's due to allergies or COVID-19/coping with the pandemic in general. Says by the afternoon, she feels tired and unmotivated to do things, but states "that may be normal." For example, says "things I used to like to do, like all the [employer], having everyone over for Easter and other  holidays, I'm now not that eager to do that anymore." States "I just don't know if I want to do all of that." Patient confirms that her feelinlgs may indeed be related to mental exhaustion after everything that's occurred over the past year. Denies being depressed and PHQ is well controlled.   Has not been exercising lately; "I haven't been [exercising] enough." Says Tom's got some exercises that he's been doing, and she's been joining in with him. She started walking, but then stopped. States "I do have a treadmill, it's just a matter of doing it."   Their 37 year old dog can't keep up with her when she walks, so she takes him for a tiny walk, going at a snail's pace, then brings him back and tries to walk by herself.   Recently had a chance to go on a long weekend for herself with friends, after getting vaccinated.   Review of Systems:  General:   Denies fever, chills, unexplained weight loss.  Optho/Auditory:   Denies visual changes, blurred vision/LOV Respiratory:   Denies SOB, DOE more than baseline levels.  Cardiovascular:   Denies chest pain, palpitations, new onset peripheral edema  Gastrointestinal:   Denies nausea, vomiting, diarrhea.  Genitourinary: Denies dysuria, freq/ urgency, flank pain or discharge from genitals.  Endocrine:     Denies hot or cold intolerance, polyuria, polydipsia. Musculoskeletal:   Denies  unexplained myalgias, joint swelling, unexplained arthralgias, gait problems.  Skin:  Denies rash, suspicious lesions Neurological:     Denies dizziness, unexplained weakness, numbness  Psychiatric/Behavioral:   Denies mood changes, suicidal or homicidal ideations, hallucinations     Objective:     Vitals: BP (!) 119/98   Pulse (!) 59   Ht 5' 5.5" (1.664 m)   Wt 170 lb (77.1 kg)   BMI 27.86 kg/m   Body mass index is 27.86 kg/m.  Advanced Directives 10/17/2014 10/17/2014 10/10/2014  Does Patient Have a Medical Advance Directive? No No No  Would patient like  information on creating a medical advance directive? No - patient declined information No - patient declined information No - patient declined information    Tobacco Social History   Tobacco Use  Smoking Status Never Smoker  Smokeless Tobacco Never Used      Counseling given: Not Answered      Past Medical History:  Diagnosis Date  . Allergy    seasonal allergy  . Arthritis    left hip  . Depression   . HSV infection    vag   Past Surgical History:  Procedure Laterality Date  . APPENDECTOMY  1984  . EYE SURGERY Left    left vitreous  . TOTAL HIP ARTHROPLASTY Left 10/17/2014   Procedure: LEFT TOTAL HIP ARTHROPLASTY ANTERIOR APPROACH;  Surgeon: Mauri Pole, MD;  Location: [facility];  Service: Orthopedics;  Laterality: Left;   Family History  Problem Relation Age of Onset  . Cancer Father   . Cancer Sister   . Thyroid cancer Sister   . Arthritis Other   . Colon cancer Neg Hx    Social History   Socioeconomic History  . Marital status: Married    Spouse name: Not on file  . Number of children: Not on file  . Years of education: Not on file  . Highest education level: Not on file  Occupational History  . Not on file  Tobacco Use  . Smoking status: Never Smoker  . Smokeless tobacco: Never Used  Substance and Sexual Activity  . Alcohol use: Yes    Alcohol/week: 10.0 standard drinks    Types: 10 Glasses of wine per week  . Drug use: No  . Sexual activity: Not Currently  Other Topics Concern  . Not on file  Social History Narrative  . Not on file   Social Determinants of Health   Financial Resource Strain:   . Difficulty of Paying Living Expenses:   Food Insecurity:   . Worried About Charity fundraiser in the Last Year:   . Arboriculturist in the Last Year:   Transportation Needs:   . Film/video editor (Medical):   Marland Kitchen Lack of Transportation (Non-Medical):   Physical Activity:   . Days of Exercise per Week:   . Minutes of Exercise per Session:    Stress:   . Feeling of Stress :   Social Connections:   . Frequency of Communication with Friends and Family:   . Frequency of Social Gatherings with Friends and Family:   . Attends Religious Services:   . Active Member of Clubs or Organizations:   . Attends Archivist Meetings:   Marland Kitchen Marital Status:     Outpatient Encounter Medications as of 02/14/2020  Medication Sig  . acyclovir (ZOVIRAX) 200 MG capsule Take 1 capsule (200 mg total) by mouth 5 (five) times daily as needed (breakouts).  Marland Kitchen albuterol (PROVENTIL  HFA;VENTOLIN HFA) 108 (90 Base) MCG/ACT inhaler Inhale 2 puffs into the lungs every 6 (six) hours as needed for wheezing or shortness of breath.  . citalopram (CELEXA) 20 MG tablet TAKE 1 AND 1/2 TAB BY MOUTH AT BEDTIME(No RF UNTIL SEEN)  . loratadine (CLARITIN) 10 MG tablet Take 10 mg by mouth daily.   No facility-administered encounter medications on file as of 02/14/2020.    Activities of Daily Living In your present state of health, do you have any difficulty performing the following activities: 02/14/2020 01/02/2020  Hearing? Tempie Donning  Vision? N N  Difficulty concentrating or making decisions? N N  Walking or climbing stairs? N N  Dressing or bathing? N N  Doing errands, shopping? N N  Some recent data might be hidden    Patient Care Team: Mellody Dance, DO as PCP - General (Family Medicine) Paralee Cancel, MD as Consulting Physician (Orthopedic Surgery) Camillo Flaming, [place] as Referring Physician (Optometry) Ob/Gyn, [facility]    Assessment:   This is a routine wellness examination for Kande.  Exercise Activities and Dietary recommendations    Goals   None     Fall Risk Fall Risk  02/14/2020 01/02/2020 08/24/2018  Falls in the past year? 0 1 No  Number falls in past yr: 0 0 -  Injury with Fall? 0 0 -  Follow up Falls evaluation completed Falls evaluation completed -   Is the patient's home free of loose throw rugs in walkways, pet beds, electrical  cords, etc?   yes      Grab bars in the bathroom? yes      Handrails on the stairs?   yes      Adequate lighting?   yes  Timed Get Up and Go performed: Telehealth   Depression Screen PHQ 2/9 Scores 02/14/2020 01/02/2020 10/07/2018 08/24/2018  PHQ - 2 Score 1 0 0 0  PHQ- 9 Score 1 0 0 -     Cognitive Function     6CIT Screen 02/14/2020  What Year? 0 points  What month? 0 points  What time? 0 points  Count back from 20 0 points  Months in reverse 0 points  Repeat phrase 0 points  Total Score 0    Immunization History  Administered Date(s) Administered  . Influenza Whole 08/22/2009, 09/19/2010  . Influenza, High Dose Seasonal PF 08/31/2018, 09/19/2019  . Influenza,inj,Quad PF,6+ Mos 10/02/2014  . Influenza-Unspecified 08/17/2014  . PFIZER SARS-COV-2 Vaccination 12/25/2019, 01/19/2020  . Pneumococcal Conjugate-13 08/31/2018  . Td 10/27/2000  . Tdap 04/21/2013    Qualifies for Shingles Vaccine?Ordered Pneumococcal: PCV 13 done 08/31/2018-ordered 23 TDAP: UTD   Screening Tests Health Maintenance  Topic Date Due  . Hepatitis C Screening  Never done  . DEXA SCAN  Never done  . PNA vac Low Risk Adult (2 of 2 - PPSV23) 09/01/2019  . MAMMOGRAM  08/18/2020  . TETANUS/TDAP  04/22/2023  . COLONOSCOPY  06/16/2023  . INFLUENZA VACCINE  Completed    Depression screen [facility] 2/9 02/14/2020 01/02/2020 10/07/2018 08/24/2018  Decreased Interest 1 0 0 0  Down, Depressed, Hopeless 0 0 0 0  PHQ - 2 Score 1 0 0 0  Altered sleeping 0 0 0 -  Tired, decreased energy 0 0 0 -  Change in appetite 0 0 0 -  Feeling bad or failure about yourself  0 0 0 -  Trouble concentrating 0 0 0 -  Moving slowly or fidgety/restless 0 0 0 -  Suicidal thoughts 0  0 0 -  PHQ-9 Score 1 0 0 -  Difficult doing work/chores Not difficult at all - - -    Cancer Screenings: Lung: Low Dose CT Chest recommended if Age 57-80 years, 30 pack-year currently smoking OR have quit w/in 15years. Patient does not  qualify. Breast:  Up to date on Mammogram? No   SHE WILL CALL her GYN Doc and schedule it  Up to date of Bone Density/Dexa? No - she will call her GYN and schedule this near future  Colorectal: UTD  Additional Screenings: Hepatitis C Screening: Patient declined HIV Screening: Patient declined.     Plan:  Medicare Wellness 1:45 PM 02/14/2020  PLAN:  - Reviewed need for up-to-date immunizations and screenings according to recommendations and guidelines.   - Last mammogram obtained in 10 of 2019. - Need for repeat mammogram. She will call for appt  - Per patient, last pap smear obtained three months ago. Records will be obtained by CMA.   - Last colonoscopy obtained 7 of 2014, with Dr. Olevia Perches. Repeat in 10 years as advised.    - Need for DEXA scan. She will call her GYN to schedule. - Per patient, does not remember last DEXA scan.   - TDAP up to date. - Due for shingles vaccination. See orders.   - Due for pneumonia vaccination. See orders.   - Encouraged patient to obtain hearing evaluation at [facility] or another reliable site. - Patient knows that she may call Ear, Nose, and Throat, or come in to clinic for in-person follow-up at any time with further ear concerns.   - In addition to prescription intervention, reviewed the spokes of the wheel of mood/stress management and well-being. Encouraged the patient to engage in regular exercise, prudent dietary habits, appropriate sleep hygiene, along with regular meditation/prayer to relax. - Advised patient to work toward adequate cardiovascular activity, ideally 150-300 minutes of cardio per week according to guidelines established by the [facility]. - Encouraged patient to work toward engaging in 30 minutes of exercise per day.   - If patient has further concerns, she knows to call clinic at any time for follow-up.   I have personally reviewed and noted the following in the patient's chart:   . Medical and social history . Use of alcohol,  tobacco or illicit drugs  . Current medications and supplements . Functional ability and status . Nutritional status . Physical activity . Advanced directives . List of other physicians . Hospitalizations, surgeries, and ER visits in previous 12 months . Vitals . Screenings to include cognitive, depression, and falls . Referrals and appointments  In addition, I have reviewed and discussed with patient certain preventive protocols, quality metrics, and best practice recommendations. A written personalized care plan for preventive services as well as general preventive health recommendations were provided to patient.

## 2020-02-15 LAB — COMPREHENSIVE METABOLIC PANEL
ALT: 15 IU/L (ref 0–32)
AST: 16 IU/L (ref 0–40)
Albumin/Globulin Ratio: 2.3 — ABNORMAL HIGH (ref 1.2–2.2)
Albumin: 4.5 g/dL (ref 3.8–4.8)
Alkaline Phosphatase: 107 IU/L (ref 39–117)
BUN/Creatinine Ratio: 26 (ref 12–28)
BUN: 18 mg/dL (ref 8–27)
Bilirubin Total: 0.5 mg/dL (ref 0.0–1.2)
CO2: 21 mmol/L (ref 20–29)
Calcium: 9.2 mg/dL (ref 8.7–10.3)
Chloride: 103 mmol/L (ref 96–106)
Creatinine, Ser: 0.7 mg/dL (ref 0.57–1.00)
GFR calc Af Amer: 103 mL/min/{1.73_m2} (ref >59)
GFR calc non Af Amer: 89 mL/min/{1.73_m2} (ref >59)
Globulin, Total: 2 g/dL (ref 1.5–4.5)
Glucose: 91 mg/dL (ref 65–99)
Potassium: 4.8 mmol/L (ref 3.5–5.2)
Sodium: 139 mmol/L (ref 134–144)
Total Protein: 6.5 g/dL (ref 6.0–8.5)

## 2020-02-15 LAB — CBC WITH DIFFERENTIAL/PLATELET
Basophils Absolute: 0 10*3/uL (ref 0.0–0.2)
Basos: 0 %
EOS (ABSOLUTE): 0.2 10*3/uL (ref 0.0–0.4)
Eos: 4 %
Hematocrit: 38.1 % (ref 34.0–46.6)
Hemoglobin: 13.1 g/dL (ref 11.1–15.9)
Immature Grans (Abs): 0 10*3/uL (ref 0.0–0.1)
Immature Granulocytes: 0 %
Lymphocytes Absolute: 1.6 10*3/uL (ref 0.7–3.1)
Lymphs: 33 %
MCH: 32.3 pg (ref 26.6–33.0)
MCHC: 34.4 g/dL (ref 31.5–35.7)
MCV: 94 fL (ref 79–97)
Monocytes Absolute: 0.4 10*3/uL (ref 0.1–0.9)
Monocytes: 9 %
Neutrophils Absolute: 2.6 10*3/uL (ref 1.4–7.0)
Neutrophils: 54 %
Platelets: 265 10*3/uL (ref 150–450)
RBC: 4.05 x10E6/uL (ref 3.77–5.28)
RDW: 12.4 % (ref 11.7–15.4)
WBC: 4.8 10*3/uL (ref 3.4–10.8)

## 2020-02-15 LAB — HEMOGLOBIN A1C
Est. average glucose Bld gHb Est-mCnc: 105 mg/dL
Hgb A1c MFr Bld: 5.3 % (ref 4.8–5.6)

## 2020-02-15 LAB — LIPID PANEL
Chol/HDL Ratio: 4.4 ratio (ref 0.0–4.4)
Cholesterol, Total: 244 mg/dL — ABNORMAL HIGH (ref 100–199)
HDL: 55 mg/dL (ref >39)
LDL Chol Calc (NIH): 170 mg/dL — ABNORMAL HIGH (ref 0–99)
Triglycerides: 109 mg/dL (ref 0–149)
VLDL Cholesterol Cal: 19 mg/dL (ref 5–40)

## 2020-02-15 LAB — VITAMIN D 25 HYDROXY (VIT D DEFICIENCY, FRACTURES): Vit D, 25-Hydroxy: 27.2 ng/mL — ABNORMAL LOW (ref 30.0–100.0)

## 2020-02-15 LAB — TSH: TSH: 2.19 u[IU]/mL (ref 0.450–4.500)

## 2020-02-15 LAB — T4, FREE: Free T4: 1.02 ng/dL (ref 0.82–1.77)

## 2020-02-16 ENCOUNTER — Telehealth: Payer: Self-pay | Admitting: Family Medicine

## 2020-02-16 NOTE — Telephone Encounter (Signed)
LVM for pt to call to discuss.  T. Kynzi Levay, CMA  

## 2020-02-16 NOTE — Telephone Encounter (Signed)
Pt states was left message regarding : Provider advice to start Cholesterol med (Pt agrees to start med but wants to know certain things like (what kind ??  & what dosage/ MG?)--she request Tonya to call her back to discuss.  --Forwarding message to med asst.  --glh

## 2020-02-20 MED ORDER — ATORVASTATIN CALCIUM 40 MG PO TABS: 40.0000 mg | ORAL_TABLET | Freq: Every evening | ORAL | 0 refills | Status: DC

## 2020-02-20 NOTE — Telephone Encounter (Signed)
Pt is agreeable to a trial of Lipitor 40mg .  RX sent to pharmacy.  Advised pt to be sure to drink plenty of water to help lessen the possibility of SEs.  Pt expressed understanding and is agreeable.  Charyl Bigger, CMA

## 2020-02-20 NOTE — Telephone Encounter (Signed)
Veronica Barrett,   As mentioned in previous message( see below), pt needs repeat ALT and FLP in 8 wks after starting new statin med however, I do not see those orders or that the pt has been scheduled for this lab visit.   I also wrote that pt will need to take 5K IU vit D daily and "reck next blood draw."  So please add that Vit D level as well.   Also, please tel pt she should f/up 1 week after her bldwrk for ov to see3 how she is doing on these new meds.   Dr Lucrezia Starch, Kriste Basque, [place]  02/16/2020 9:50 AM EDT    02/16/2020 Pt informed of results. Pt expressed understanding and is agreeable vitamin D therapy. However, pt states that she would like to "think about the cholesterol medication" and will call back by the end of the day to inform us of her decision. Charyl Bigger, CMA   Mellody Dance, DO  02/15/2020 7:02 PM EDT    Call patient let her know that she meets criteria to go on cholesterol medicines as well as vitamin D supplements.  -See if she is willing to start Lipitor 40 mg 1 p.o. nightly dispense 90 no refill. If she is, please write future order in 8 weeks for ALT and FLP, in addition to vitamin D if she agrees to that supplementation. -I recommend she take 5000 IUs vitamin D3 daily. And recheck this during next blood draw.

## 2020-02-21 ENCOUNTER — Other Ambulatory Visit: Payer: Self-pay

## 2020-02-21 NOTE — Telephone Encounter (Signed)
Pt was informed of Vitamin D recommendations per result note as well as need for repeat labs in 6 weeks.  Per office administrator, lab orders will need to be placed at the time the patient comes in for labs since Dr. Raliegh Scarlet will not be with the office at the time that labs are needed.  Charyl Bigger, CMA

## 2020-02-21 NOTE — Progress Notes (Signed)
Opened in error. T. Kashayla Ungerer, CMA 

## 2020-02-22 ENCOUNTER — Telehealth: Payer: Self-pay | Admitting: Family Medicine

## 2020-02-22 ENCOUNTER — Other Ambulatory Visit: Payer: Self-pay | Admitting: Family Medicine

## 2020-02-22 DIAGNOSIS — E785 Hyperlipidemia, unspecified: Secondary | ICD-10-CM

## 2020-02-22 MED ORDER — ATORVASTATIN CALCIUM 20 MG PO TABS: 20.0000 mg | ORAL_TABLET | Freq: Every day | ORAL | 0 refills | Status: DC

## 2020-02-22 NOTE — Telephone Encounter (Signed)
Patient called and states she would like to start Lipitor at a lower dose (20mg ) instead of prescribed 40mg  tab. She is concerned that this is to much medicine and wants to know if she is going to have to take this medication indefinitely.   Please review and advise. She picked up the 40mg  tabs yesterday. AS, CMA

## 2020-02-22 NOTE — Telephone Encounter (Signed)
Patient is requesting a call back from clinic staff to discuss the Lipitor she was placed on. She thought the beginning dose would be 20 mg but what he picked up/what was ordered was 40 mg. She wants to make sure this dosage is right prior to taking this med. Please contact patient on mobile when available.

## 2020-02-22 NOTE — Telephone Encounter (Signed)
If all she is willing to take is 20mg  then we will document that patient refuses to take 40mg  and she can take a half a tablet nightly.  I do not recommend this, however, please change it in the med list if this dose is what patient insists to take.

## 2020-02-22 NOTE — Progress Notes (Signed)
Patient declines Lipitor 40mg  and states she wants to start with the more 'conservative' dosage of medication.  I have d/ced lipitor 40mg  from chart and documented 20mg  (dose at which patient is going to start med) Future order for ALT and Lipid placed per documentation from labs. Call transferred to front desk for scheduling. AS, CMA    Please see all below documentation in regards to Lipid panel and Lipitor medication. AS, CMA   Fonnie Mu, CMA  02/16/2020 9:50 AM EDT    02/16/2020 Pt informed of results. Pt expressed understanding and is agreeable vitamin D therapy. However, pt states that she would like to "think about the cholesterol medication" and will call back by the end of the day to inform us of her decision. Charyl Bigger, CMA   Mellody Dance, DO  02/15/2020 7:02 PM EDT    Call patient let her know that she meets criteria to go on cholesterol medicines as well as vitamin D supplements.  -See if she is willing to start Lipitor 40 mg 1 p.o. nightly dispense 90 no refill. If she is, please write future order in 8 weeks for ALT and FLP, in addition to vitamin D if she agrees to that supplementation. -I recommend she take 5000 IUs vitamin D3 daily. And recheck this during next blood draw.         Deborah, DO  You 49 minutes ago (3:42 PM)       If all she is willing to take is 20mg  then we will document that patient refuses to take 40mg  and she can take a half a tablet nightly.  I do not recommend this, however, please change it in the med list if this dose is what patient insists to take.      Documentation   You  Opalski, Deborah, DO 4 hours ago (12:03 PM)     Patient called and states she would like to start Lipitor at a lower dose (20mg ) instead of prescribed 40mg  tab. She is concerned that this is to much medicine and wants to know if she is going to have to take this medication indefinitely.   Please review and advise. She picked up the 40mg  tabs yesterday.  AS, CMA      Documentation   [facility] routed conversation to [facility] 5 hours ago (11:00 AM)  [place] [facility] 5 hours ago (11:00 AM)  TE   Patient is requesting a call back from clinic staff to discuss the Lipitor she was placed on. She thought the beginning dose would be 20 mg but what he picked up/what was ordered was 40 mg. She wants to make sure this dosage is right prior to taking this med. Please contact patient on mobile when available.      Documentation

## 2020-02-22 NOTE — Telephone Encounter (Signed)
Please see other documentation in regards to this message. AS, CMA

## 2020-02-26 ENCOUNTER — Other Ambulatory Visit: Payer: Self-pay | Admitting: Family Medicine

## 2020-02-26 DIAGNOSIS — F339 Major depressive disorder, recurrent, unspecified: Secondary | ICD-10-CM

## 2020-03-24 ENCOUNTER — Other Ambulatory Visit: Payer: Self-pay | Admitting: Family Medicine

## 2020-04-13 ENCOUNTER — Other Ambulatory Visit: Payer: Self-pay | Admitting: Physician Assistant

## 2020-04-13 DIAGNOSIS — E785 Hyperlipidemia, unspecified: Secondary | ICD-10-CM

## 2020-04-18 ENCOUNTER — Other Ambulatory Visit: Payer: Medicare Other

## 2020-04-18 ENCOUNTER — Other Ambulatory Visit: Payer: Self-pay

## 2020-04-18 DIAGNOSIS — E785 Hyperlipidemia, unspecified: Secondary | ICD-10-CM | POA: Diagnosis not present

## 2020-04-19 LAB — ALT: ALT: 20 IU/L (ref 0–32)

## 2020-04-19 LAB — LIPID PANEL
Chol/HDL Ratio: 2.7 ratio (ref 0.0–4.4)
Cholesterol, Total: 133 mg/dL (ref 100–199)
HDL: 50 mg/dL (ref >39)
LDL Chol Calc (NIH): 70 mg/dL (ref 0–99)
Triglycerides: 60 mg/dL (ref 0–149)
VLDL Cholesterol Cal: 13 mg/dL (ref 5–40)

## 2020-05-20 ENCOUNTER — Other Ambulatory Visit: Payer: Self-pay | Admitting: Family Medicine

## 2020-05-20 DIAGNOSIS — F339 Major depressive disorder, recurrent, unspecified: Secondary | ICD-10-CM

## 2020-05-23 ENCOUNTER — Telehealth: Payer: Self-pay | Admitting: Physician Assistant

## 2020-05-23 DIAGNOSIS — F339 Major depressive disorder, recurrent, unspecified: Secondary | ICD-10-CM

## 2020-05-23 MED ORDER — CITALOPRAM HYDROBROMIDE 20 MG PO TABS: ORAL_TABLET | ORAL | 0 refills | Status: DC

## 2020-05-23 NOTE — Telephone Encounter (Signed)
Patient is requesting a refill of her citalopram, if approved please send to [facility] on [address].

## 2020-05-23 NOTE — Telephone Encounter (Signed)
Refill sent to requested pharmacy. AS, CMA 

## 2020-05-23 NOTE — Addendum Note (Signed)
Addended by: Mickel Crow on: 05/23/2020 01:19 PM   Modules accepted: Orders

## 2020-07-30 DIAGNOSIS — Z1231 Encounter for screening mammogram for malignant neoplasm of breast: Secondary | ICD-10-CM | POA: Diagnosis not present

## 2020-07-30 LAB — HM MAMMOGRAPHY

## 2020-08-06 DIAGNOSIS — H2513 Age-related nuclear cataract, bilateral: Secondary | ICD-10-CM | POA: Diagnosis not present

## 2020-08-06 DIAGNOSIS — H16229 Keratoconjunctivitis sicca, not specified as Sjogren's, unspecified eye: Secondary | ICD-10-CM | POA: Diagnosis not present

## 2020-08-06 DIAGNOSIS — H25013 Cortical age-related cataract, bilateral: Secondary | ICD-10-CM | POA: Diagnosis not present

## 2020-08-06 DIAGNOSIS — H35372 Puckering of macula, left eye: Secondary | ICD-10-CM | POA: Diagnosis not present

## 2020-08-17 ENCOUNTER — Telehealth: Payer: Self-pay | Admitting: Physician Assistant

## 2020-08-17 ENCOUNTER — Other Ambulatory Visit: Payer: Self-pay | Admitting: Physician Assistant

## 2020-08-17 DIAGNOSIS — F339 Major depressive disorder, recurrent, unspecified: Secondary | ICD-10-CM

## 2020-08-17 NOTE — Telephone Encounter (Signed)
Please call patient to schedule apt for further refills. AS, CMA 

## 2020-08-23 DIAGNOSIS — Z23 Encounter for immunization: Secondary | ICD-10-CM | POA: Diagnosis not present

## 2020-08-29 ENCOUNTER — Telehealth: Payer: Self-pay | Admitting: Physician Assistant

## 2020-08-29 ENCOUNTER — Other Ambulatory Visit: Payer: Self-pay | Admitting: Family Medicine

## 2020-08-29 MED ORDER — ATORVASTATIN CALCIUM 20 MG PO TABS: 20.0000 mg | ORAL_TABLET | Freq: Every day | ORAL | 0 refills | Status: DC

## 2020-08-29 NOTE — Telephone Encounter (Signed)
Refill sent to requested pharmacy. Please call patient to schedule apt for refills. AS, CMA

## 2020-08-29 NOTE — Telephone Encounter (Signed)
Patient states her Lipitor was sent to Dr. Colman [facility] and [facility]. [facility] [address]

## 2020-08-29 NOTE — Addendum Note (Signed)
Addended by: Mickel Crow on: 08/29/2020 02:38 PM   Modules accepted: Orders

## 2020-08-30 ENCOUNTER — Encounter: Payer: Self-pay | Admitting: Physician Assistant

## 2020-09-10 DIAGNOSIS — H16229 Keratoconjunctivitis sicca, not specified as Sjogren's, unspecified eye: Secondary | ICD-10-CM | POA: Diagnosis not present

## 2020-09-10 DIAGNOSIS — H16143 Punctate keratitis, bilateral: Secondary | ICD-10-CM | POA: Diagnosis not present

## 2020-09-10 DIAGNOSIS — H0288B Meibomian gland dysfunction left eye, upper and lower eyelids: Secondary | ICD-10-CM | POA: Diagnosis not present

## 2020-09-10 DIAGNOSIS — H0288A Meibomian gland dysfunction right eye, upper and lower eyelids: Secondary | ICD-10-CM | POA: Diagnosis not present

## 2020-10-21 ENCOUNTER — Other Ambulatory Visit: Payer: Self-pay | Admitting: Physician Assistant

## 2020-10-21 DIAGNOSIS — F339 Major depressive disorder, recurrent, unspecified: Secondary | ICD-10-CM

## 2020-11-21 ENCOUNTER — Other Ambulatory Visit: Payer: Self-pay | Admitting: Physician Assistant

## 2020-11-27 DIAGNOSIS — M79671 Pain in right foot: Secondary | ICD-10-CM | POA: Insufficient documentation

## 2020-12-04 ENCOUNTER — Ambulatory Visit: Payer: Medicare Other | Admitting: Physician Assistant

## 2020-12-04 ENCOUNTER — Encounter: Payer: Self-pay | Admitting: Physician Assistant

## 2020-12-04 ENCOUNTER — Ambulatory Visit (INDEPENDENT_AMBULATORY_CARE_PROVIDER_SITE_OTHER): Payer: Medicare Other | Admitting: Physician Assistant

## 2020-12-04 ENCOUNTER — Other Ambulatory Visit: Payer: Self-pay

## 2020-12-04 ENCOUNTER — Other Ambulatory Visit: Payer: Self-pay | Admitting: Physician Assistant

## 2020-12-04 VITALS — BP 124/62 | Ht 65.5 in | Wt 180.0 lb

## 2020-12-04 DIAGNOSIS — E785 Hyperlipidemia, unspecified: Secondary | ICD-10-CM | POA: Diagnosis not present

## 2020-12-04 DIAGNOSIS — F339 Major depressive disorder, recurrent, unspecified: Secondary | ICD-10-CM | POA: Diagnosis not present

## 2020-12-04 DIAGNOSIS — Z6829 Body mass index (BMI) 29.0-29.9, adult: Secondary | ICD-10-CM

## 2020-12-04 MED ORDER — CITALOPRAM HYDROBROMIDE 20 MG PO TABS: 30.0000 mg | ORAL_TABLET | Freq: Every day | ORAL | 0 refills | Status: DC

## 2020-12-04 MED ORDER — ATORVASTATIN CALCIUM 20 MG PO TABS
20.0000 mg | ORAL_TABLET | Freq: Every day | ORAL | 1 refills | Status: DC
Start: 2020-12-04 — End: 2021-07-11

## 2020-12-04 NOTE — Progress Notes (Signed)
Telehealth office visit note for Veronica Reid, PA-C- at Primary Care at [facility]   I connected with current patient today by telephone and verified that I am speaking with the correct person   . Location of the patient: Home . Location of the provider: Office - This visit type was conducted due to national recommendations for restrictions regarding the COVID-19 Pandemic (e.g. social distancing) in an effort to limit this patient's exposure and mitigate transmission in our community.    - No physical exam could be performed with this format, beyond that communicated to [place] by the patient/ family members as noted.   - Additionally my office staff/ schedulers were to discuss with the patient that there may be a monetary charge related to this service, depending on their medical insurance.  My understanding is that patient understood and consented to proceed.     _________________________________________________________________________________   History of Present Illness: Patient calls in to follow up on mood and hyperlipidemia.  Mood: Patient has been on Celexa 30 mg for quite some time. Reports in the past was advised medication dose was on higher end and states is willing to try 20 mg. Has tolerated medication without issues. Denies chest pain, palpitations or arrhythmia. Medication helps with keeping anxiety and depression stable. States can notice a difference when misses a dose.  HLD: Pt taking medication as directed without issues. Plans to start [place] and has a treadmill at home which she intends to start using.   Weight: Has been challenging to lose weight and verbalizes the need to start being more active. Is planning to start dietary changes.     No flowsheet data found.  Depression screen [facility] 2/9 12/04/2020 02/14/2020 01/02/2020 10/07/2018 08/24/2018  Decreased Interest 0 1 0 0 0  Down, Depressed, Hopeless 0 0 0 0 0  PHQ - 2 Score 0 1 0 0 0  Altered  sleeping 0 0 0 0 -  Tired, decreased energy 0 0 0 0 -  Change in appetite 0 0 0 0 -  Feeling bad or failure about yourself  0 0 0 0 -  Trouble concentrating 0 0 0 0 -  Moving slowly or fidgety/restless 0 0 0 0 -  Suicidal thoughts 0 0 0 0 -  PHQ-9 Score 0 1 0 0 -  Difficult doing work/chores - Not difficult at all - - -      Impression and Recommendations:     1. Depression, recurrent ([place])   2. Hyperlipidemia, unspecified hyperlipidemia type   3. BMI 29.0-29.9,adult     Depression, recurrent: -PHQ-9 score of 0. -Discussed potential side effects of Citalopram at higher doses including prolonged Qt interval and it is reasonable to trial 20 mg. Advised to taper 1/2 dose over two week period and notify me if mood not controlled with decreased dose and will resume 30 mg and closely monitor medication therapy. Patient verbalized understanding and agreeable.  -Will continue to monitor.  Hyperlipidemia, unspecified hyperlipidemia type: -Last lipid panel: total cholesterol 244, triglycerides 109, HDL 55, LDL 170 (above goal) -Continue current medication regimen. Provided refill. Last hepatic function wnl. -Recommend to start Mediterranean diet and gradually increase physical activity.  -Will continue to monitor and plan to repeat lipid panel and hepatic function with [facility].  BMI 29.0-29.9, adult: -Encourage to start weight loss efforts with dietary and lifestyle modifications.  -Patient has FHx of thyroid disorders (hyperthyroidis and thyroid cancer) so will repeat thyroid labs with  [facility].  - As part of my medical decision making, I reviewed the following data within the [place] History obtained from pt /family, CMA notes reviewed and incorporated if applicable, Labs reviewed, Radiograph/ tests reviewed if applicable and OV notes from prior OV's with me, as well as any other specialists she/he has seen since seeing me last, were all reviewed and used in my medical decision  making process today.    - Additionally, when appropriate, discussion had with patient regarding our treatment plan, and their biases/concerns about that plan were used in my medical decision making today.    - The patient agreed with the plan and demonstrated an understanding of the instructions.   No barriers to understanding were identified.     - The patient was advised to call back or seek an in-person evaluation if the symptoms worsen or if the condition fails to improve as anticipated.   Return for [facility] and [facility] 1 wk prior in 3-4 months .    No orders of the defined types were placed in this encounter.   Meds ordered this encounter  Medications  . atorvastatin (LIPITOR) 20 MG tablet    Sig: Take 1 tablet (20 mg total) by mouth daily.    Dispense:  90 tablet    Refill:  1  . citalopram (CELEXA) 20 MG tablet    Sig: Take 1.5 tablets (30 mg total) by mouth daily.    Dispense:  45 tablet    Refill:  0    Medications Discontinued During This Encounter  Medication Reason  . citalopram (CELEXA) 20 MG tablet Reorder  . atorvastatin (LIPITOR) 20 MG tablet Reorder       Time spent on visit including pre-visit chart review and post-visit care was 15 minutes.      The [place] was signed into law in 2016 which includes the topic of electronic health records.  This provides immediate access to information in MyChart.  This includes consultation notes, operative notes, office notes, lab results and pathology reports.  If you have any questions about what you read please let us know at your next visit or call us at the office.  We are right here with you.  Note:  This note was prepared with assistance of Dragon voice recognition software. Occasional wrong-word or sound-a-like substitutions may have occurred due to the inherent limitations of voice recognition software.  __________________________________________________________________________________     Patient  Care Team    Relationship Specialty Notifications Start End  Veronica Barrett, [place] PCP - General   03/18/20   Paralee Cancel, MD Consulting Physician Orthopedic Surgery  10/07/18   Camillo Flaming, [place] Referring Physician Optometry  10/07/18   Ob/Gyn, [place]    01/02/20      -Vitals obtained; medications/ allergies reconciled;  personal medical, social, Sx etc.histories were updated by CMA, reviewed by me and are reflected in chart   Patient Active Problem List   Diagnosis Date Noted  . Pain in right foot 11/27/2020  . Environmental and seasonal allergies 10/07/2018  . Chronic sinusitis 10/07/2018  . H/O sinus surgery 10/07/2018  . Hyperlipidemia 10/07/2018  . S/P left THA, AA 10/17/2014  . Recurrent HSV (herpes simplex virus) 04/21/2013  . Depression, recurrent ([place]) 02/20/2010  . Allergic rhinitis 04/16/2007     Current Meds  Medication Sig  . acyclovir (ZOVIRAX) 200 MG capsule Take 1 capsule (200 mg total) by mouth 5 (five) times daily as needed (breakouts).  Marland Kitchen  albuterol (PROVENTIL HFA;VENTOLIN HFA) 108 (90 Base) MCG/ACT inhaler Inhale 2 puffs into the lungs every 6 (six) hours as needed for wheezing or shortness of breath.  . loratadine (CLARITIN) 10 MG tablet Take 10 mg by mouth daily.  . [DISCONTINUED] atorvastatin (LIPITOR) 20 MG tablet TAKE 1 TABLET (20 MG TOTAL) BY MOUTH DAILY. **NEEDS APT FOR FURTHER REFILLS**  . [DISCONTINUED] citalopram (CELEXA) 20 MG tablet TAKE 1.5 TABLETS BY MOUTH DAILY. TAKE 1 AND 1/2 TAB BY MOUTH AT BEDTIME**NEEDS APT FOR FURTHER REFILLS**     Allergies:  Allergies  Allergen Reactions  . Seasonal Ic [Cholestatin]   . Tape Rash     ROS:  See above HPI for pertinent positives and negatives   Objective:   Blood pressure 124/62, height 5' 5.5" (1.664 m), weight 180 lb (81.6 kg).  (if some vitals are omitted, this means that patient was UNABLE to obtain them.) General: A & O * 3; sounds in no acute distress Respiratory: speaking in full  sentences, no conversational dyspnea Psych: insight appears good, mood- appears full

## 2020-12-19 DIAGNOSIS — M7062 Trochanteric bursitis, left hip: Secondary | ICD-10-CM | POA: Diagnosis not present

## 2020-12-27 ENCOUNTER — Other Ambulatory Visit: Payer: Self-pay | Admitting: Physician Assistant

## 2020-12-27 DIAGNOSIS — F339 Major depressive disorder, recurrent, unspecified: Secondary | ICD-10-CM

## 2021-04-02 ENCOUNTER — Other Ambulatory Visit: Payer: Self-pay | Admitting: Physician Assistant

## 2021-04-02 DIAGNOSIS — E785 Hyperlipidemia, unspecified: Secondary | ICD-10-CM

## 2021-04-02 DIAGNOSIS — Z0000 Encounter for general adult medical examination without abnormal findings: Secondary | ICD-10-CM

## 2021-04-05 ENCOUNTER — Other Ambulatory Visit: Payer: Self-pay

## 2021-04-05 ENCOUNTER — Other Ambulatory Visit: Payer: Medicare Other

## 2021-04-05 DIAGNOSIS — E785 Hyperlipidemia, unspecified: Secondary | ICD-10-CM | POA: Diagnosis not present

## 2021-04-05 DIAGNOSIS — Z0000 Encounter for general adult medical examination without abnormal findings: Secondary | ICD-10-CM | POA: Diagnosis not present

## 2021-04-06 LAB — COMPREHENSIVE METABOLIC PANEL
ALT: 22 IU/L (ref 0–32)
AST: 19 IU/L (ref 0–40)
Albumin/Globulin Ratio: 1.9 (ref 1.2–2.2)
Albumin: 4.4 g/dL (ref 3.8–4.8)
Alkaline Phosphatase: 103 IU/L (ref 44–121)
BUN/Creatinine Ratio: 26 (ref 12–28)
BUN: 18 mg/dL (ref 8–27)
Bilirubin Total: 0.5 mg/dL (ref 0.0–1.2)
CO2: 20 mmol/L (ref 20–29)
Calcium: 9.2 mg/dL (ref 8.7–10.3)
Chloride: 102 mmol/L (ref 96–106)
Creatinine, Ser: 0.68 mg/dL (ref 0.57–1.00)
Globulin, Total: 2.3 g/dL (ref 1.5–4.5)
Glucose: 97 mg/dL (ref 65–99)
Potassium: 5.1 mmol/L (ref 3.5–5.2)
Sodium: 141 mmol/L (ref 134–144)
Total Protein: 6.7 g/dL (ref 6.0–8.5)
eGFR: 94 mL/min/{1.73_m2} (ref >59)

## 2021-04-06 LAB — LIPID PANEL
Chol/HDL Ratio: 2.8 ratio (ref 0.0–4.4)
Cholesterol, Total: 147 mg/dL (ref 100–199)
HDL: 53 mg/dL (ref >39)
LDL Chol Calc (NIH): 80 mg/dL (ref 0–99)
Triglycerides: 71 mg/dL (ref 0–149)
VLDL Cholesterol Cal: 14 mg/dL (ref 5–40)

## 2021-04-06 LAB — CBC
Hematocrit: 40.1 % (ref 34.0–46.6)
Hemoglobin: 12.8 g/dL (ref 11.1–15.9)
MCH: 31.1 pg (ref 26.6–33.0)
MCHC: 31.9 g/dL (ref 31.5–35.7)
MCV: 97 fL (ref 79–97)
Platelets: 227 10*3/uL (ref 150–450)
RBC: 4.12 x10E6/uL (ref 3.77–5.28)
RDW: 12.1 % (ref 11.7–15.4)
WBC: 5.5 10*3/uL (ref 3.4–10.8)

## 2021-04-06 LAB — TSH: TSH: 2.93 u[IU]/mL (ref 0.450–4.500)

## 2021-04-12 ENCOUNTER — Encounter: Payer: Self-pay | Admitting: Physician Assistant

## 2021-04-12 ENCOUNTER — Ambulatory Visit (INDEPENDENT_AMBULATORY_CARE_PROVIDER_SITE_OTHER): Payer: Medicare Other | Admitting: Physician Assistant

## 2021-04-12 ENCOUNTER — Other Ambulatory Visit: Payer: Self-pay | Admitting: Physician Assistant

## 2021-04-12 VITALS — BP 108/75 | HR 64 | Ht 66.0 in | Wt 180.0 lb

## 2021-04-12 DIAGNOSIS — Z0000 Encounter for general adult medical examination without abnormal findings: Secondary | ICD-10-CM

## 2021-04-12 DIAGNOSIS — E2839 Other primary ovarian failure: Secondary | ICD-10-CM

## 2021-04-12 DIAGNOSIS — Z87898 Personal history of other specified conditions: Secondary | ICD-10-CM | POA: Diagnosis not present

## 2021-04-12 DIAGNOSIS — F339 Major depressive disorder, recurrent, unspecified: Secondary | ICD-10-CM

## 2021-04-12 DIAGNOSIS — Z780 Asymptomatic menopausal state: Secondary | ICD-10-CM

## 2021-04-12 DIAGNOSIS — E559 Vitamin D deficiency, unspecified: Secondary | ICD-10-CM

## 2021-04-12 MED ORDER — ALBUTEROL SULFATE HFA 108 (90 BASE) MCG/ACT IN AERS: 2.0000 | INHALATION_SPRAY | Freq: Four times a day (QID) | RESPIRATORY_TRACT | 1 refills | Status: DC | PRN

## 2021-04-12 NOTE — Patient Instructions (Signed)
Preventive Care 69 Years and Older, Female Preventive care refers to lifestyle choices and visits with your health care provider that can promote health and wellness. This includes:  A yearly physical exam. This is also called an annual wellness visit.  Regular dental and eye exams.  Immunizations.  Screening for certain conditions.  Healthy lifestyle choices, such as: ? Eating a healthy diet. ? Getting regular exercise. ? Not using drugs or products that contain nicotine and tobacco. ? Limiting alcohol use. What can I expect for my preventive care visit? Physical exam Your health care provider will check your:  Height and weight. These may be used to calculate your BMI (body mass index). BMI is a measurement that tells if you are at a healthy weight.  Heart rate and blood pressure.  Body temperature.  Skin for abnormal spots. Counseling Your health care provider may ask you questions about your:  Past medical problems.  Family's medical history.  Alcohol, tobacco, and drug use.  Emotional well-being.  Home life and relationship well-being.  Sexual activity.  Diet, exercise, and sleep habits.  History of falls.  Memory and ability to understand (cognition).  Work and work Statistician.  Pregnancy and menstrual history.  Access to firearms. What immunizations do I need? Vaccines are usually given at various ages, according to a schedule. Your health care provider will recommend vaccines for you based on your age, medical history, and lifestyle or other factors, such as travel or where you work.   What tests do I need? Blood tests  Lipid and cholesterol levels. These may be checked every 5 years, or more often depending on your overall health.  Hepatitis C test.  Hepatitis B test. Screening  Lung cancer screening. You may have this screening every year starting at age 69 if you have a 30-pack-year history of smoking and currently smoke or have quit within  the past 15 years.  Colorectal cancer screening. ? All adults should have this screening starting at age 69 and continuing until age 69. ? Your health care provider may recommend screening at age 69 if you are at increased risk. ? You will have tests every 1-10 years, depending on your results and the type of screening test.  Diabetes screening. ? This is done by checking your blood sugar (glucose) after you have not eaten for a while (fasting). ? You may have this done every 1-3 years.  Mammogram. ? This may be done every 1-2 years. ? Talk with your health care provider about how often you should have regular mammograms.  Abdominal aortic aneurysm (AAA) screening. You may need this if you are a current or former smoker.  BRCA-related cancer screening. This may be done if you have a family history of breast, ovarian, tubal, or peritoneal cancers. Other tests  STD (sexually transmitted disease) testing, if you are at risk.  Bone density scan. This is done to screen for osteoporosis. You may have this done starting at age 69. Talk with your health care provider about your test results, treatment options, and if necessary, the need for more tests. Follow these instructions at home: Eating and drinking  Eat a diet that includes fresh fruits and vegetables, whole grains, lean protein, and low-fat dairy products. Limit your intake of foods with high amounts of sugar, saturated fats, and salt.  Take vitamin and mineral supplements as recommended by your health care provider.  Do not drink alcohol if your health care provider tells you not to drink.  If you drink alcohol: ? Limit how much you have to 0-1 drink a day. ? Be aware of how much alcohol is in your drink. In the [place] one drink equals one 12 oz bottle of beer (355 mL), one 5 oz glass of wine (148 mL), or one 1 oz glass of hard liquor (44 mL).   Lifestyle  Take daily care of your teeth and gums. Brush your teeth every morning  and night with fluoride toothpaste. Floss one time each day.  Stay active. Exercise for at least 30 minutes 5 or more days each week.  Do not use any products that contain nicotine or tobacco, such as cigarettes, e-cigarettes, and chewing tobacco. If you need help quitting, ask your health care provider.  Do not use drugs.  If you are sexually active, practice safe sex. Use a condom or other form of protection in order to prevent STIs (sexually transmitted infections).  Talk with your health care provider about taking a low-dose aspirin or statin.  Find healthy ways to cope with stress, such as: ? Meditation, yoga, or listening to music. ? Journaling. ? Talking to a trusted person. ? Spending time with friends and family. Safety  Always wear your seat belt while driving or riding in a vehicle.  Do not drive: ? If you have been drinking alcohol. Do not ride with someone who has been drinking. ? When you are tired or distracted. ? While texting.  Wear a helmet and other protective equipment during sports activities.  If you have firearms in your house, make sure you follow all gun safety procedures. What's next?  Visit your health care provider once a year for an annual wellness visit.  Ask your health care provider how often you should have your eyes and teeth checked.  Stay up to date on all vaccines. This information is not intended to replace advice given to you by your health care provider. Make sure you discuss any questions you have with your health care provider. Document Revised: 10/24/2020 Document Reviewed: 10/28/2018 Elsevier Patient Education  2021 [employer]  

## 2021-04-12 NOTE — Progress Notes (Signed)
Virtual Visit via Telephone Note:  I connected with Veronica Barrett by telephone and verified that I am speaking with the correct person using two identifiers.    I discussed the limitations, risks, security and privacy concerns for performing an evaluation and management service by telephone and the availability of in person appointments. The staff discussed with patient that there may be a patient responsible charge related to this service. The patient expressed understanding and agreed to proceed.   Location of Patient- Home Location of Provider- Office   Subjective:   Veronica Barrett is a 70 y.o. female who presents for Medicare Annual (Subsequent) preventive examination.  Review of Systems    General:   No F/C, wt loss Pulm:   No DIB, SOB, pleuritic chest pain Card:  No CP, palpitations Abd:  No n/v/d or pain Ext:  No inc edema from baseline    Objective:    Today's Vitals   04/12/21 0829  Weight: 180 lb (81.6 kg)  Height: 5\' 6"  (1.676 m)   Body mass index is 29.05 kg/m.  Advanced Directives 10/17/2014 10/17/2014 10/10/2014  Does Patient Have a Medical Advance Directive? No No No  Would patient like information on creating a medical advance directive? No - patient declined information No - patient declined information No - patient declined information    Current Medications (verified) Outpatient Encounter Medications as of 04/12/2021  Medication Sig  . acyclovir (ZOVIRAX) 200 MG capsule Take 1 capsule (200 mg total) by mouth 5 (five) times daily as needed (breakouts).  Marland Kitchen atorvastatin (LIPITOR) 20 MG tablet Take 1 tablet (20 mg total) by mouth daily.  . citalopram (CELEXA) 20 MG tablet TAKE 1 AND 1/2 TABLETS DAILY BY MOUTH  . [DISCONTINUED] albuterol (PROVENTIL HFA;VENTOLIN HFA) 108 (90 Base) MCG/ACT inhaler Inhale 2 puffs into the lungs every 6 (six) hours as needed for wheezing or shortness of breath.  Marland Kitchen albuterol (VENTOLIN HFA) 108 (90 Base) MCG/ACT inhaler Inhale 2 puffs  into the lungs every 6 (six) hours as needed for shortness of breath.  . [DISCONTINUED] loratadine (CLARITIN) 10 MG tablet Take 10 mg by mouth daily.   No facility-administered encounter medications on file as of 04/12/2021.    Allergies (verified) Seasonal ic [cholestatin] and Tape   History: Past Medical History:  Diagnosis Date  . Allergy    seasonal allergy  . Arthritis    left hip  . Depression   . HSV infection    vag   Past Surgical History:  Procedure Laterality Date  . APPENDECTOMY  1984  . EYE SURGERY Left    left vitreous  . TOTAL HIP ARTHROPLASTY Left 10/17/2014   Procedure: LEFT TOTAL HIP ARTHROPLASTY ANTERIOR APPROACH;  Surgeon: Mauri Pole, MD;  Location: [facility];  Service: Orthopedics;  Laterality: Left;   Family History  Problem Relation Age of Onset  . Cancer Father   . Cancer Sister   . Thyroid cancer Sister   . Arthritis Other   . Colon cancer Neg Hx    Social History   Socioeconomic History  . Marital status: Married    Spouse name: Not on file  . Number of children: Not on file  . Years of education: Not on file  . Highest education level: Not on file  Occupational History  . Not on file  Tobacco Use  . Smoking status: Never Smoker  . Smokeless tobacco: Never Used  Substance and Sexual Activity  . Alcohol use: Yes  Alcohol/week: 10.0 standard drinks    Types: 10 Glasses of wine per week  . Drug use: No  . Sexual activity: Not Currently  Other Topics Concern  . Not on file  Social History Narrative  . Not on file   Social Determinants of Health   Financial Resource Strain: Not on file  Food Insecurity: Not on file  Transportation Needs: Not on file  Physical Activity: Not on file  Stress: Not on file  Social Connections: Not on file    Tobacco Counseling Counseling given: Not Answered    Diabetic? No   Activities of Daily Living In your present state of health, do you have any difficulty performing the following  activities: 04/12/2021 12/04/2020  Hearing? Veronica Barrett  Vision? N N  Difficulty concentrating or making decisions? N N  Walking or climbing stairs? N Y  Dressing or bathing? N N  Doing errands, shopping? N N  Some recent data might be hidden    Patient Care Team: [name], PA-C as PCP - General Paralee Cancel, MD as Consulting Physician (Orthopedic Surgery) Camillo Flaming, [place] as Referring Physician (Optometry) Ob/Gyn, [facility] any recent Medical Services you may have received from other than [facility] providers in the past year (date may be approximate).     Assessment:   This is a routine wellness examination for Veronica Barrett.  Hearing/Vision screen No exam data present  Dietary issues and exercise activities discussed:  -Stays active with using the treadmill, yard and house work. Reports eats low fat diet, has egg white frittata in the morning, tuna salad for lunch and grilled chicken with sauteed vegetables. Has improved water intake to 3 water bottles (16.9 fl oz) per day.  Goals Addressed   None    Depression Screen PHQ 2/9 Scores 04/12/2021 12/04/2020 02/14/2020 01/02/2020 10/07/2018 08/24/2018  PHQ - 2 Score 0 0 1 0 0 0  PHQ- 9 Score 1 0 1 0 0 -    Fall Risk Fall Risk  04/12/2021 12/04/2020 02/14/2020 01/02/2020 08/24/2018  Falls in the past year? 0 0 0 1 No  Number falls in past yr: 0 - 0 0 -  Injury with Fall? 0 - 0 0 -  Follow up Falls evaluation completed Falls evaluation completed Falls evaluation completed Falls evaluation completed -    FALL RISK PREVENTION PERTAINING TO THE HOME:  Any stairs in or around the home? Yes  If so, are there any without handrails? Yes  Home free of loose throw rugs in walkways, pet beds, electrical cords, etc? Yes  Adequate lighting in your home to reduce risk of falls? Yes   ASSISTIVE DEVICES UTILIZED TO PREVENT FALLS:  Life alert? No  Use of a cane, walker or w/c? No  Grab bars in the bathroom? Yes  Shower chair or bench in  shower? No  Elevated toilet seat or a handicapped toilet? No   TIMED UP AND GO:  Was the test performed? No .  Length of time to ambulate 10 feet:  sec.   Telehealth  Cognitive Function: wnl     6CIT Screen 04/12/2021 02/14/2020  What Year? 0 points 0 points  What month? - 0 points  What time? 0 points 0 points  Count back from 20 0 points 0 points  Months in reverse 0 points 0 points  Repeat phrase 0 points 0 points  Total Score - 0    Immunizations Immunization History  Administered Date(s) Administered  . Influenza Whole 08/22/2009, 09/19/2010  .  Influenza, High Dose Seasonal PF 08/31/2018, 09/19/2019  . Influenza,inj,Quad PF,6+ Mos 10/02/2014  . Influenza-Unspecified 08/17/2014  . PFIZER(Purple Top)SARS-COV-2 Vaccination 12/25/2019, 01/19/2020  . Pneumococcal Conjugate-13 08/31/2018  . Td 10/27/2000  . Tdap 04/21/2013    TDAP status: Up to date  Flu Vaccine status: Up to date  Pneumococcal vaccine status: Up to date  Covid-19 vaccine status: Completed vaccines  Qualifies for Shingles Vaccine? No   Zostavax completed No   Shingrix Completed?: No.    Education has been provided regarding the importance of this vaccine. Patient has been advised to call insurance company to determine out of pocket expense if they have not yet received this vaccine. Advised may also receive vaccine at local pharmacy or Health Dept. Verbalized acceptance and understanding.  Screening Tests Health Maintenance  Topic Date Due  . Hepatitis C Screening  Never done  . Zoster Vaccines- Shingrix (1 of 2) Never done  . DEXA SCAN  Never done  . PNA vac Low Risk Adult (2 of 2 - PPSV23) 09/01/2019  . COVID-19 Vaccine (3 - Booster for Pfizer series) 06/20/2020  . INFLUENZA VACCINE  06/17/2021  . MAMMOGRAM  07/30/2022  . TETANUS/TDAP  04/22/2023  . COLONOSCOPY (Pts 45-37yrs Insurance coverage will need to be confirmed)  06/16/2023  . HPV VACCINES  Aged Out    Health Maintenance  Health  Maintenance Due  Topic Date Due  . Hepatitis C Screening  Never done  . Zoster Vaccines- Shingrix (1 of 2) Never done  . DEXA SCAN  Never done  . PNA vac Low Risk Adult (2 of 2 - PPSV23) 09/01/2019  . COVID-19 Vaccine (3 - Booster for Pfizer series) 06/20/2020    Colorectal cancer screening: Type of screening: Colonoscopy. Completed 06/15/2013. Repeat every 0 years  Mammogram status: Completed 07/30/2020. Repeat every year  Bone Density status: Ordered today. Pt provided with contact info and advised to call to schedule appt.  Lung Cancer Screening: (Low Dose CT Chest recommended if Age 36-80 years, 30 pack-year currently smoking OR have quit w/in 15years.) does not qualify.   Lung Cancer Screening Referral: n/a  Additional Screening:  Hepatitis C Screening: does qualify; Completed Pt declined  Vision Screening: Recommended annual ophthalmology exams for early detection of glaucoma and other disorders of the eye. Is the patient up to date with their annual eye exam?  Yes  Who is the provider or what is the name of the office in which the patient attends annual eye exams? Dr. Peter Garter If pt is not established with a provider, would they like to be referred to a provider to establish care? No .   Dental Screening: Recommended annual dental exams for proper oral hygiene  Community Resource Referral / Chronic Care Management: CRR required this visit?  No   CCM required this visit?  No      Plan:  -Discussed most recent lab results, all are essentially within normal limits. -Continue current medication regimen.  Provided needed refills. -Follow-up in 4 months for mood, HLD  I have personally reviewed and noted the following in the patient's chart:   . Medical and social history . Use of alcohol, tobacco or illicit drugs  . Current medications and supplements including opioid prescriptions.  . Functional ability and status . Nutritional status . Physical activity . Advanced  directives . List of other physicians . Hospitalizations, surgeries, and ER visits in previous 12 months . Vitals . Screenings to include cognitive, depression, and falls . Referrals and appointments  In addition, I have reviewed and discussed with patient certain preventive protocols, quality metrics, and best practice recommendations. A written personalized care plan for preventive services as well as general preventive health recommendations were provided to patient.

## 2021-06-04 ENCOUNTER — Other Ambulatory Visit: Payer: Self-pay

## 2021-06-04 ENCOUNTER — Ambulatory Visit
Admission: RE | Admit: 2021-06-04 | Discharge: 2021-06-04 | Disposition: A | Discharge status: Home / Self Care | Payer: Medicare Other | Source: Ambulatory Visit | Attending: Physician Assistant | Admitting: Physician Assistant

## 2021-06-04 DIAGNOSIS — E2839 Other primary ovarian failure: Secondary | ICD-10-CM

## 2021-06-04 DIAGNOSIS — M810 Age-related osteoporosis without current pathological fracture: Secondary | ICD-10-CM | POA: Diagnosis not present

## 2021-06-04 DIAGNOSIS — Z780 Asymptomatic menopausal state: Secondary | ICD-10-CM

## 2021-06-04 DIAGNOSIS — E559 Vitamin D deficiency, unspecified: Secondary | ICD-10-CM

## 2021-06-04 DIAGNOSIS — M85851 Other specified disorders of bone density and structure, right thigh: Secondary | ICD-10-CM | POA: Diagnosis not present

## 2021-06-05 ENCOUNTER — Encounter: Payer: Self-pay | Admitting: Physician Assistant

## 2021-06-05 ENCOUNTER — Ambulatory Visit (INDEPENDENT_AMBULATORY_CARE_PROVIDER_SITE_OTHER): Payer: Medicare Other | Admitting: Physician Assistant

## 2021-06-05 VITALS — BP 123/73 | HR 62 | Temp 98.6°F | Ht 65.0 in | Wt 183.9 lb

## 2021-06-05 DIAGNOSIS — M810 Age-related osteoporosis without current pathological fracture: Secondary | ICD-10-CM | POA: Diagnosis not present

## 2021-06-05 DIAGNOSIS — J40 Bronchitis, not specified as acute or chronic: Secondary | ICD-10-CM | POA: Diagnosis not present

## 2021-06-05 MED ORDER — METHYLPREDNISOLONE ACETATE 40 MG/ML IJ SUSP
40.0000 mg | Freq: Once | INTRAMUSCULAR | Status: AC
  Administered 2021-06-05: 40 mg via INTRAMUSCULAR

## 2021-06-05 MED ORDER — PREDNISONE 20 MG PO TABS: ORAL_TABLET | ORAL | 0 refills | Status: DC

## 2021-06-05 MED ORDER — ALENDRONATE SODIUM 70 MG PO TABS
70.0000 mg | ORAL_TABLET | ORAL | 1 refills | Status: DC
Start: 2021-06-05 — End: 2021-11-20

## 2021-06-05 NOTE — Patient Instructions (Addendum)
Acute Bronchitis, Adult  Acute bronchitis is sudden or acute swelling of the air tubes (bronchi) in the lungs. Acute bronchitis causes these tubes to fill with mucus, whichcan make it hard to breathe. It can also cause coughing or wheezing. In adults, acute bronchitis usually goes away within 2 weeks. A cough caused by bronchitis may last up to 3 weeks. Smoking, allergies, and asthma can make thecondition worse. What are the causes? This condition can be caused by germs and by substances that irritate the lungs, including: Cold and flu viruses. The most common cause of this condition is the virus that causes the common cold. Bacteria. Substances that irritate the lungs, including: Smoke from cigarettes and other forms of tobacco. Dust and pollen. Fumes from chemical products, gases, or burned fuel. Other materials that pollute indoor or outdoor air. Close contact with someone who has acute bronchitis. What increases the risk? The following factors may make you more likely to develop this condition: A weak body's defense system, also called the immune system. A condition that affects your lungs and breathing, such as asthma. What are the signs or symptoms? Common symptoms of this condition include: Lung and breathing problems, such as: Coughing. This may bring up clear, yellow, or green mucus from your lungs (sputum). Wheezing. Having too much mucus in your lungs (chest congestion). Having shortness of breath. A fever. Chills. Aches and pains, including: Tightness in your chest and other body aches. A sore throat. How is this diagnosed? This condition is usually diagnosed based on: Your symptoms and medical history. A physical exam. You may also have other tests, including tests to rule out other conditions, such as pneumonia. These tests include: A test of lung function. Test of a mucus sample to look for the presence of bacteria. Tests to check the oxygen level in your  blood. Blood tests. Chest X-ray. How is this treated? Most cases of acute bronchitis clear up over time without treatment. Your health care provider may recommend: Drinking more fluids. This can thin your mucus, which may improve your breathing. Using a device that gets medicine into your lungs (inhaler) to help improve breathing and control coughing. Using a vaporizer or a humidifier. These are machines that add water to the air to help you breathe better. Taking a medicine for a fever. Taking a medicine that thins mucus and clears congestion (expectorant). Taking a medicine that prevents or stops coughing (cough suppressant). Follow these instructions at home: Activity Get plenty of rest. Return to your normal activities as told by your health care provider. Ask your health care provider what activities are safe for you. Lifestyle  Drink enough fluid to keep your urine pale yellow. Do not drink alcohol. Do not use any products that contain nicotine or tobacco, such as cigarettes, e-cigarettes, and chewing tobacco. If you need help quitting, ask your health care provider. Be aware that: Your bronchitis will get worse if you smoke or breathe in other people's smoke (secondhand smoke). Your lungs will heal faster if you quit smoking.  General instructions Take over-the-counter and prescription medicines only as told by your health care provider. Use an inhaler, vaporizer, or humidifier as told by your health care provider. If you have a sore throat, gargle with a salt-water mixture 3-4 times a day or as needed. To make a salt-water mixture, completely dissolve -1 tsp (3-6 g) of salt in 1 cup (237 mL) of warm water. Take two teaspoons of honey at bedtime to lessen coughing at night.  Keep all follow-up visits as told by your health care provider. This is important. How is this prevented? To lower your risk of getting this condition again: Wash your hands often with soap and water. If  soap and water are not available, use hand sanitizer. Avoid contact with people who have cold symptoms. Try not to touch your mouth, nose, or eyes with your hands. Avoid places where there are fumes from chemicals. Breathing these fumes will make your condition worse. Get the flu shot every year. Contact a health care provider if: Your symptoms do not improve after 2 weeks of treatment. You vomit more than once or twice. You have symptoms of dehydration such as: Dark urine. Dry skin or eyes. Increased thirst. Headaches. Confusion. Muscle cramps. Get help right away if you: Cough up blood. Feel pain in your chest. Have severe shortness of breath. Faint or keep feeling like you are going to faint. Have a severe headache. Have fever or chills that get worse. These symptoms may represent a serious problem that is an emergency. Do not wait to see if the symptoms will go away. Get medical help right away. Call your local emergency services (911 in the [place] Do not drive yourself to the hospital. Summary Acute bronchitis is sudden (acute) inflammation of the air tubes (bronchi) between the windpipe and the lungs. In adults, acute bronchitis usually goes away within 2 weeks, although coughing may last 3 weeks or longer. Take over-the-counter and prescription medicines only as told by your health care provider. Drink enough fluid to keep your urine pale yellow. Contact a health care provider if your symptoms do not improve after 2 weeks of treatment. Get help right away if you cough up blood, faint, or have chest pain or shortness of breath. This information is not intended to replace advice given to you by your health care provider. Make sure you discuss any questions you have with your healthcare provider. Document Revised: 10/03/2020 Document Reviewed: 05/27/2019 Elsevier Patient Education  [place].

## 2021-06-05 NOTE — Progress Notes (Signed)
Acute Office Visit  Subjective:    Patient ID: Veronica Barrett, female    DOB: 1951-04-24, 70 y.o.   MRN: [id number]  Chief Complaint  Patient presents with   Acute Visit   Wheezing        Nasal Congestion    HPI Patient is in today for c/o wheezing, fatigue, chest tightness, some shortness of breath, nasal and chest congestion, productive cough with grey-green sputum, and slight intermittent sinus pressure and headache x 2 days. Patient reports has been using albuterol inhaler which has provided some relief with wheezing. Patient states was prescribed albuterol a few years ago for wheezing and was told possibly COPD related. Denies fever, chills, sore throat, chest pain or recent sick contact exposures. Did a Covid test yesterday afternoon which resulted negative. Took a decongestant last night and has been drinking tea.  Past Medical History:  Diagnosis Date   Allergy    seasonal allergy   Arthritis    left hip   Depression    HSV infection    vag    Past Surgical History:  Procedure Laterality Date   APPENDECTOMY  1984   EYE SURGERY Left    left vitreous   TOTAL HIP ARTHROPLASTY Left 10/17/2014   Procedure: LEFT TOTAL HIP ARTHROPLASTY ANTERIOR APPROACH;  Surgeon: Mauri Pole, MD;  Location: [facility];  Service: Orthopedics;  Laterality: Left;    Family History  Problem Relation Age of Onset   Cancer Father    Cancer Sister    Thyroid cancer Sister    Arthritis Other    Colon cancer Neg Hx     Social History   Socioeconomic History   Marital status: Married    Spouse name: Not on file   Number of children: Not on file   Years of education: Not on file   Highest education level: Not on file  Occupational History   Not on file  Tobacco Use   Smoking status: Never   Smokeless tobacco: Never  Substance and Sexual Activity   Alcohol use: Yes    Alcohol/week: 10.0 standard drinks    Types: 10 Glasses of wine per week   Drug use: No   Sexual activity: Not  Currently  Other Topics Concern   Not on file  Social History Narrative   Not on file   Social Determinants of Health   Financial Resource Strain: Not on file  Food Insecurity: Not on file  Transportation Needs: Not on file  Physical Activity: Not on file  Stress: Not on file  Social Connections: Not on file  Intimate Partner Violence: Not on file    Outpatient Medications Prior to Visit  Medication Sig Dispense Refill   acyclovir (ZOVIRAX) 200 MG capsule Take 1 capsule (200 mg total) by mouth 5 (five) times daily as needed (breakouts). 100 capsule 3   albuterol (VENTOLIN HFA) 108 (90 Base) MCG/ACT inhaler Inhale 2 puffs into the lungs every 6 (six) hours as needed for shortness of breath. 1 each 1   atorvastatin (LIPITOR) 20 MG tablet Take 1 tablet (20 mg total) by mouth daily. 90 tablet 1   citalopram (CELEXA) 20 MG tablet TAKE 1 AND 1/2 TABLETS DAILY BY MOUTH 135 tablet 1   No facility-administered medications prior to visit.    Allergies  Allergen Reactions   Seasonal Ic [Cholestatin]    Tape Rash    Review of Systems A fourteen system review of systems was performed and found to be  positive as per HPI.  Objective:    Physical Exam General:  Pleasant and cooperative, in no acute distress, non-diaphoretic  Neuro:  Alert and oriented,  extra-ocular muscles intact  HEENT:  Normocephalic, atraumatic, no sinus tenderness, clear conjunctiva, fluid behind both TM's, boggy turbinates, pink and moist oropharynx, neck supple, no adenopathy  Skin:  no gross rash, warm, pink. Cardiac:  RRR, S1 S2 Respiratory:  Significant wheezing with inspiration and expiration, scattered rhonchi, no crackles or rales, Not using accessory muscles, speaking in full sentences- unlabored. Vascular:  Ext warm, no cyanosis apprec.; cap RF less 2 sec. Psych:  No HI/SI, judgement and insight good, Euthymic mood. Full Affect.  BP 123/73   Pulse 62   Temp 98.6 F (37 C)   Ht 5' 5"  (1.651 m)   Wt  183 lb 14.4 oz (83.4 kg)   SpO2 96%   BMI 30.60 kg/m  Wt Readings from Last 3 Encounters:  06/05/21 183 lb 14.4 oz (83.4 kg)  04/12/21 180 lb (81.6 kg)  12/04/20 180 lb (81.6 kg)    Health Maintenance Due  Topic Date Due   Hepatitis C Screening  Never done   Zoster Vaccines- Shingrix (1 of 2) Never done   PNA vac Low Risk Adult (2 of 2 - PPSV23) 09/01/2019   COVID-19 Vaccine (3 - Pfizer risk series) 02/16/2020    There are no preventive care reminders to display for this patient.   Lab Results  Component Value Date   TSH 2.930 04/05/2021   Lab Results  Component Value Date   WBC 5.5 04/05/2021   HGB 12.8 04/05/2021   HCT 40.1 04/05/2021   MCV 97 04/05/2021   PLT 227 04/05/2021   Lab Results  Component Value Date   NA 141 04/05/2021   K 5.1 04/05/2021   CO2 20 04/05/2021   GLUCOSE 97 04/05/2021   BUN 18 04/05/2021   CREATININE 0.68 04/05/2021   BILITOT 0.5 04/05/2021   ALKPHOS 103 04/05/2021   AST 19 04/05/2021   ALT 22 04/05/2021   PROT 6.7 04/05/2021   ALBUMIN 4.4 04/05/2021   CALCIUM 9.2 04/05/2021   ANIONGAP 9 10/18/2014   EGFR 94 04/05/2021   GFR 84.61 08/24/2018   Lab Results  Component Value Date   CHOL 147 04/05/2021   Lab Results  Component Value Date   HDL 53 04/05/2021   Lab Results  Component Value Date   LDLCALC 80 04/05/2021   Lab Results  Component Value Date   TRIG 71 04/05/2021   Lab Results  Component Value Date   CHOLHDL 2.8 04/05/2021   Lab Results  Component Value Date   HGBA1C 5.3 02/14/2020       Assessment & Plan:   Problem List Items Addressed This Visit   None Visit Diagnoses     Bronchitis    -  Primary   Relevant Medications   predniSONE (DELTASONE) 20 MG tablet   methylPREDNISolone acetate (DEPO-MEDROL) injection 40 mg (Completed)   Osteoporosis without current pathological fracture, unspecified osteoporosis type       Relevant Medications   alendronate (FOSAMAX) 70 MG tablet       Bronchitis: -Symptoms ongoing <7-10 days with no red flag signs or symptoms concerning for bacterial infection or pneumonia at this time so will defer antibiotic therapy. Will administer corticosteroid therapy (Depo-Medrol 40 mg) today and advised to start steroid taper tomorrow. Continue with albuterol inhaler. Offered rx for nebulizer device for breathing treatments, patient deferred at this  time and prefers to monitor symptoms. Continue home supportive care. Discussed potential side effects with decongestant. Advised to let me know if symptoms fail to improve or worsen and will consider antibiotic therapy and/or imaging studies such as chest x-ray.  Osteoporosis without current pathological fracture: -Discussed with patient recent bone density report which reveals osteoporosis (T score of -3.4 of left forearm, T score of -2.3 of right femur neck) and patient has a hx of left hip fracture. Discussed management options including medications and potential side effects. Patient agreeable to starting bisphosphonate therapy. Recommend starting medication once feeling well. Advised to let me know if unable to tolerate medication. -Recommend to take Vitamin D 800 units and calcium 1200 mg supplement.  Meds ordered this encounter  Medications   predniSONE (DELTASONE) 20 MG tablet    Sig: Take 2 tablets by mouth x 2 days, then 1 tablet x 2 days, then 0.5 tablet x 2 days    Dispense:  7 tablet    Refill:  0    Order Specific Question:   Supervising Provider    Answer:   Beatrice Lecher D [[id number]]   alendronate (FOSAMAX) 70 MG tablet    Sig: Take 1 tablet (70 mg total) by mouth once a week. Take with a full glass of water on an empty stomach.    Dispense:  12 tablet    Refill:  1    Order Specific Question:   Supervising Provider    Answer:   Beatrice Lecher D [[id number]]   methylPREDNISolone acetate (DEPO-MEDROL) injection 40 mg    Note:  This note was prepared with assistance of Dragon voice  recognition software. Occasional wrong-word or sound-a-like substitutions may have occurred due to the inherent limitations of voice recognition software.  [name], PA-C

## 2021-06-09 ENCOUNTER — Ambulatory Visit
Admission: EM | Admit: 2021-06-09 | Discharge: 2021-06-09 | Disposition: A | Discharge status: Home / Self Care | Payer: Medicare Other | Attending: Urgent Care | Admitting: Urgent Care

## 2021-06-09 ENCOUNTER — Ambulatory Visit (INDEPENDENT_AMBULATORY_CARE_PROVIDER_SITE_OTHER): Payer: Medicare Other

## 2021-06-09 ENCOUNTER — Other Ambulatory Visit: Payer: Self-pay

## 2021-06-09 ENCOUNTER — Encounter: Payer: Self-pay | Admitting: Emergency Medicine

## 2021-06-09 DIAGNOSIS — R062 Wheezing: Secondary | ICD-10-CM

## 2021-06-09 DIAGNOSIS — R059 Cough, unspecified: Secondary | ICD-10-CM | POA: Diagnosis not present

## 2021-06-09 DIAGNOSIS — J4540 Moderate persistent asthma, uncomplicated: Secondary | ICD-10-CM | POA: Diagnosis not present

## 2021-06-09 DIAGNOSIS — Z20822 Contact with and (suspected) exposure to covid-19: Secondary | ICD-10-CM | POA: Diagnosis not present

## 2021-06-09 HISTORY — DX: Unspecified asthma, uncomplicated: J45.909

## 2021-06-09 MED ORDER — NEBULIZER MASK ADULT MISC: 1.0000 | 1 refills | Status: DC | PRN

## 2021-06-09 MED ORDER — METHYLPREDNISOLONE ACETATE 80 MG/ML IJ SUSP
80.0000 mg | Freq: Once | INTRAMUSCULAR | Status: AC
  Administered 2021-06-09: 80 mg via INTRAMUSCULAR

## 2021-06-09 MED ORDER — ALBUTEROL SULFATE (2.5 MG/3ML) 0.083% IN NEBU: 2.5000 mg | INHALATION_SOLUTION | Freq: Four times a day (QID) | RESPIRATORY_TRACT | 12 refills | Status: DC | PRN

## 2021-06-09 MED ORDER — NEBULIZER SYSTEM ALL-IN-ONE MISC: 0 refills | Status: DC

## 2021-06-09 NOTE — ED Triage Notes (Signed)
Pt here for increased cough and wheezing despite steroids and using inhaler; pt sts hx of asthma; pt sts cough is now non productive

## 2021-06-09 NOTE — ED Provider Notes (Signed)
[facility]   MRN: [id number] DOB: [birth year]  Subjective:   Veronica Barrett is a 70 y.o. female presenting for 1 week history of persistent wheezing, coughing, difficulty with her breathing and chest pain.  Patient was seen at a different facility, received an IM Depo-Medrol injection and an oral steroid course which she only has 2 days left for this.  She did not get tested at that facility for COVID-19 but did at home test that were negative.  She has been scheduling her albuterol inhaler but is only helping temporarily.  Does not have nebulized albuterol.  No current facility-administered medications for this encounter.  Current Outpatient Medications:    acyclovir (ZOVIRAX) 200 MG capsule, Take 1 capsule (200 mg total) by mouth 5 (five) times daily as needed (breakouts)., Disp: 100 capsule, Rfl: 3   albuterol (VENTOLIN HFA) 108 (90 Base) MCG/ACT inhaler, Inhale 2 puffs into the lungs every 6 (six) hours as needed for shortness of breath., Disp: 1 each, Rfl: 1   alendronate (FOSAMAX) 70 MG tablet, Take 1 tablet (70 mg total) by mouth once a week. Take with a full glass of water on an empty stomach., Disp: 12 tablet, Rfl: 1   atorvastatin (LIPITOR) 20 MG tablet, Take 1 tablet (20 mg total) by mouth daily., Disp: 90 tablet, Rfl: 1   citalopram (CELEXA) 20 MG tablet, TAKE 1 AND 1/2 TABLETS DAILY BY MOUTH, Disp: 135 tablet, Rfl: 1   predniSONE (DELTASONE) 20 MG tablet, Take 2 tablets by mouth x 2 days, then 1 tablet x 2 days, then 0.5 tablet x 2 days, Disp: 7 tablet, Rfl: 0   Allergies  Allergen Reactions   Seasonal Ic [Cholestatin]    Tape Rash    Past Medical History:  Diagnosis Date   Allergy    seasonal allergy   Arthritis    left hip   Asthma    Depression    HSV infection    vag     Past Surgical History:  Procedure Laterality Date   APPENDECTOMY  1984   EYE SURGERY Left    left vitreous   TOTAL HIP ARTHROPLASTY Left 10/17/2014   Procedure: LEFT TOTAL HIP  ARTHROPLASTY ANTERIOR APPROACH;  Surgeon: Mauri Pole, MD;  Location: [facility];  Service: Orthopedics;  Laterality: Left;    Family History  Problem Relation Age of Onset   Cancer Father    Cancer Sister    Thyroid cancer Sister    Arthritis Other    Colon cancer Neg Hx     Social History   Tobacco Use   Smoking status: Never   Smokeless tobacco: Never  Substance Use Topics   Alcohol use: Yes    Alcohol/week: 10.0 standard drinks    Types: 10 Glasses of wine per week   Drug use: No    ROS   Objective:   Vitals: BP (!) 154/80 (BP Location: Right Arm)   Pulse 64   Temp 98.1 F (36.7 C) (Oral)   Resp 20   SpO2 94%   Physical Exam Constitutional:      General: She is not in acute distress.    Appearance: Normal appearance. She is well-developed. She is ill-appearing. She is not toxic-appearing or diaphoretic.  HENT:     Head: Normocephalic and atraumatic.     Nose: Nose normal.     Mouth/Throat:     Mouth: Mucous membranes are moist.  Eyes:     Extraocular Movements: Extraocular movements intact.  Pupils: Pupils are equal, round, and reactive to light.  Cardiovascular:     Rate and Rhythm: Normal rate and regular rhythm.     Pulses: Normal pulses.     Heart sounds: Normal heart sounds. No murmur heard.   No friction rub. No gallop.  Pulmonary:     Effort: Pulmonary effort is normal. No respiratory distress.     Breath sounds: No stridor. Wheezing present. No rhonchi or rales.  Chest:     Chest wall: No tenderness.  Skin:    General: Skin is warm and dry.     Findings: No rash.  Neurological:     Mental Status: She is alert and oriented to person, place, and time.  Psychiatric:        Mood and Affect: Mood normal.        Behavior: Behavior normal.        Thought Content: Thought content normal.    DG Chest 2 View  Result Date: 06/09/2021 CLINICAL DATA:  Wheezing and cough EXAM: CHEST - 2 VIEW COMPARISON:  None. FINDINGS: Normal heart size. Normal  mediastinal contour. No pneumothorax. No pleural effusion. Lungs appear clear, with no acute consolidative airspace disease and no pulmonary edema. IMPRESSION: No active cardiopulmonary disease. Electronically Signed   By: Ilona Sorrel M.D.   On: 06/09/2021 10:18     Assessment and Plan :   PDMP not reviewed this encounter.  1. Moderate persistent asthma without complication   2. Wheezing     Will use one more IM Depo-Medrol dose at 80 mg.  Provided her with a prescription for nebulized albuterol and also printed prescription for nebulizer machine with equipment.  COVID-19 testing pending. Counseled patient on potential for adverse effects with medications prescribed/recommended today, ER and return-to-clinic precautions discussed, patient verbalized understanding.    Jaynee Eagles, PA-C 06/09/21 1048

## 2021-06-10 ENCOUNTER — Telehealth: Payer: Self-pay

## 2021-06-10 ENCOUNTER — Encounter: Payer: Self-pay | Admitting: Physician Assistant

## 2021-06-10 ENCOUNTER — Ambulatory Visit (INDEPENDENT_AMBULATORY_CARE_PROVIDER_SITE_OTHER): Payer: Medicare Other | Admitting: Physician Assistant

## 2021-06-10 VITALS — BP 125/79 | HR 59 | Temp 97.9°F | Ht 65.0 in | Wt 183.0 lb

## 2021-06-10 DIAGNOSIS — J4541 Moderate persistent asthma with (acute) exacerbation: Secondary | ICD-10-CM | POA: Diagnosis not present

## 2021-06-10 DIAGNOSIS — J40 Bronchitis, not specified as acute or chronic: Secondary | ICD-10-CM | POA: Diagnosis not present

## 2021-06-10 LAB — SARS-COV-2, NAA 2 DAY TAT

## 2021-06-10 LAB — NOVEL CORONAVIRUS, NAA: SARS-CoV-2, NAA: NOT DETECTED

## 2021-06-10 MED ORDER — CEFTRIAXONE SODIUM 1 G IJ SOLR
500.0000 mg | INTRAMUSCULAR | Status: DC
  Administered 2021-06-10: 500 mg via INTRAMUSCULAR

## 2021-06-10 MED ORDER — METHYLPREDNISOLONE SODIUM SUCC 125 MG IJ SOLR
125.0000 mg | Freq: Once | INTRAMUSCULAR | Status: AC
  Administered 2021-06-10: 125 mg via INTRAMUSCULAR

## 2021-06-10 MED ORDER — BUDESONIDE 90 MCG/ACT IN AEPB
1.0000 | INHALATION_SPRAY | Freq: Two times a day (BID) | RESPIRATORY_TRACT | 0 refills | Status: DC
Start: 2021-06-10 — End: 2021-06-22

## 2021-06-10 MED ORDER — ALBUTEROL SULFATE (2.5 MG/3ML) 0.083% IN NEBU
2.5000 mg | INHALATION_SOLUTION | Freq: Four times a day (QID) | RESPIRATORY_TRACT | 1 refills | Status: DC | PRN
Start: 2021-06-10 — End: 2021-06-22

## 2021-06-10 NOTE — Progress Notes (Signed)
Acute Office Visit  Subjective:    Patient ID: Veronica Barrett, female    DOB: 08/08/1951, 70 y.o.   MRN: [id number]  Chief Complaint  Patient presents with   Shortness of Breath    HPI Patient is in today for worsening wheezing and shortness of breath. Patient was seen last week and started on steroid taper for bronchitis and wheezing. States symptoms were improving and then respiratory symptoms got worse. She went to UC yesterday and reports was given a steroid injection and rx for nebulizer machine which she picked up this morning but has not been able to pick up nebulizer solution. Patient reports had green nasal discharge. No fever. States cough attacks worsen her breathing symptoms. Using albuterol every 4 hours which provides some relief.   Past Medical History:  Diagnosis Date   Allergy    seasonal allergy   Arthritis    left hip   Asthma    Depression    HSV infection    vag    Past Surgical History:  Procedure Laterality Date   APPENDECTOMY  1984   EYE SURGERY Left    left vitreous   TOTAL HIP ARTHROPLASTY Left 10/17/2014   Procedure: LEFT TOTAL HIP ARTHROPLASTY ANTERIOR APPROACH;  Surgeon: Mauri Pole, MD;  Location: [facility];  Service: Orthopedics;  Laterality: Left;    Family History  Problem Relation Age of Onset   Cancer Father    Cancer Sister    Thyroid cancer Sister    Arthritis Other    Colon cancer Neg Hx     Social History   Socioeconomic History   Marital status: Married    Spouse name: Not on file   Number of children: Not on file   Years of education: Not on file   Highest education level: Not on file  Occupational History   Not on file  Tobacco Use   Smoking status: Never   Smokeless tobacco: Never  Substance and Sexual Activity   Alcohol use: Yes    Alcohol/week: 10.0 standard drinks    Types: 10 Glasses of wine per week   Drug use: No   Sexual activity: Not Currently  Other Topics Concern   Not on file  Social History Narrative    Not on file   Social Determinants of Health   Financial Resource Strain: Not on file  Food Insecurity: Not on file  Transportation Needs: Not on file  Physical Activity: Not on file  Stress: Not on file  Social Connections: Not on file  Intimate Partner Violence: Not on file    Outpatient Medications Prior to Visit  Medication Sig Dispense Refill   acyclovir (ZOVIRAX) 200 MG capsule Take 1 capsule (200 mg total) by mouth 5 (five) times daily as needed (breakouts). 100 capsule 3   albuterol (VENTOLIN HFA) 108 (90 Base) MCG/ACT inhaler Inhale 2 puffs into the lungs every 6 (six) hours as needed for shortness of breath. 1 each 1   alendronate (FOSAMAX) 70 MG tablet Take 1 tablet (70 mg total) by mouth once a week. Take with a full glass of water on an empty stomach. 12 tablet 1   atorvastatin (LIPITOR) 20 MG tablet Take 1 tablet (20 mg total) by mouth daily. 90 tablet 1   citalopram (CELEXA) 20 MG tablet TAKE 1 AND 1/2 TABLETS DAILY BY MOUTH 135 tablet 1   Nebulizer System All-In-One MISC Use with nebulized albuterol once every 4-6 hours for wheezing and shortness of breath.  1 each 0   predniSONE (DELTASONE) 20 MG tablet Take 2 tablets by mouth x 2 days, then 1 tablet x 2 days, then 0.5 tablet x 2 days 7 tablet 0   Respiratory Therapy Supplies (NEBULIZER MASK ADULT) MISC 1 Inhaler by Does not apply route every 4 (four) hours as needed. 1 each 1   albuterol (PROVENTIL) (2.5 MG/3ML) 0.083% nebulizer solution Take 3 mLs (2.5 mg total) by nebulization every 6 (six) hours as needed for wheezing or shortness of breath. (Patient not taking: Reported on 06/10/2021) 75 mL 12   No facility-administered medications prior to visit.    Allergies  Allergen Reactions   Seasonal Ic [Cholestatin]    Tape Rash    Review of Systems Review of Systems:  A fourteen system review of systems was performed and found to be positive as per HPI.    Objective:    Physical Exam General:  Well Developed,  well nourished, non-toxic appearing  Neuro:  Alert and oriented,  extra-ocular muscles intact  HEENT:  Normocephalic, atraumatic, neck supple, mild anterior cervical adenopathy  Skin:  no gross rash, warm, pink. Cardiac:  RRR, S1 S2 Respiratory:  Inspiratory and expiratory wheezing, scattered rhonchi and some crackles at lung bases. Vascular:  Ext warm, no cyanosis apprec.; cap RF less 2 sec. Psych:  No HI/SI, judgement and insight good, Euthymic mood. Full Affect.  BP 125/79   Pulse (!) 59   Temp 97.9 F (36.6 C)   Ht $R'5\' 5"'kb$  (1.651 m)   Wt 183 lb (83 kg)   SpO2 95%   BMI 30.45 kg/m  Wt Readings from Last 3 Encounters:  06/10/21 183 lb (83 kg)  06/05/21 183 lb 14.4 oz (83.4 kg)  04/12/21 180 lb (81.6 kg)    Health Maintenance Due  Topic Date Due   Hepatitis C Screening  Never done   Zoster Vaccines- Shingrix (1 of 2) Never done   PNA vac Low Risk Adult (2 of 2 - PPSV23) 09/01/2019   COVID-19 Vaccine (3 - Pfizer risk series) 02/16/2020    There are no preventive care reminders to display for this patient.   Lab Results  Component Value Date   TSH 2.930 04/05/2021   Lab Results  Component Value Date   WBC 5.5 04/05/2021   HGB 12.8 04/05/2021   HCT 40.1 04/05/2021   MCV 97 04/05/2021   PLT 227 04/05/2021   Lab Results  Component Value Date   NA 141 04/05/2021   K 5.1 04/05/2021   CO2 20 04/05/2021   GLUCOSE 97 04/05/2021   BUN 18 04/05/2021   CREATININE 0.68 04/05/2021   BILITOT 0.5 04/05/2021   ALKPHOS 103 04/05/2021   AST 19 04/05/2021   ALT 22 04/05/2021   PROT 6.7 04/05/2021   ALBUMIN 4.4 04/05/2021   CALCIUM 9.2 04/05/2021   ANIONGAP 9 10/18/2014   EGFR 94 04/05/2021   GFR 84.61 08/24/2018   Lab Results  Component Value Date   CHOL 147 04/05/2021   Lab Results  Component Value Date   HDL 53 04/05/2021   Lab Results  Component Value Date   LDLCALC 80 04/05/2021   Lab Results  Component Value Date   TRIG 71 04/05/2021   Lab Results   Component Value Date   CHOLHDL 2.8 04/05/2021   Lab Results  Component Value Date   HGBA1C 5.3 02/14/2020       Assessment & Plan:   Problem List Items Addressed This Visit   None  Visit Diagnoses     Moderate persistent asthma with exacerbation    -  Primary   Relevant Medications   methylPREDNISolone sodium succinate (SOLU-MEDROL) 125 mg/2 mL injection 125 mg (Completed)   cefTRIAXone (ROCEPHIN) injection 500 mg   albuterol (PROVENTIL) (2.5 MG/3ML) 0.083% nebulizer solution   Budesonide 90 MCG/ACT inhaler   Bronchitis       Relevant Medications   methylPREDNISolone sodium succinate (SOLU-MEDROL) 125 mg/2 mL injection 125 mg (Completed)   albuterol (PROVENTIL) (2.5 MG/3ML) 0.083% nebulizer solution   Budesonide 90 MCG/ACT inhaler      Moderate persistent asthma with exacerbation, bronchitis: -Reviewed UC note, imaging and labs. Covid-test resulted negative. CXR revealed no active cardiopulmonary disease. -Due to clinically worsening respiratory symptoms despite being on steroid taper and receiving second depo-medrol 80 mg injection yesterday with significant adventitious sounds on exam will administer solu-medrol 125 mg and 1 dose of ceftriaxone 500 mg for potentially underlying bacterial sinusitis (patient reports mucopurulent drainage and symptoms worsen after > 5 days). Vital signs stable (oxygen sat 95%). -Will provide new rx for nebulizer solution and recommend to perform breathing treatment at home as soon as possible. -Continue albuterol as needed and will send rx for inhaled corticosteroid to use twice daily for 10 days for acute exacerbation. -Follow up in 48 hours. Monitor for worsening symptoms including fever, altered mental status, or severe shortness of breath.  -Discussed with patient once symptoms are improving recommend further evaluation for asthma (no prior hx), recent CXR shows no evidence of COPD. Bronchitis likely associated with bronchospasm or  asthma.  Meds ordered this encounter  Medications   methylPREDNISolone sodium succinate (SOLU-MEDROL) 125 mg/2 mL injection 125 mg   cefTRIAXone (ROCEPHIN) injection 500 mg    Order Specific Question:   Antibiotic Indication:    Answer:   Other Indication (list below)    Order Specific Question:   Other Indication:    Answer:   Asthma exurbation   albuterol (PROVENTIL) (2.5 MG/3ML) 0.083% nebulizer solution    Sig: Take 3 mLs (2.5 mg total) by nebulization every 6 (six) hours as needed for wheezing or shortness of breath.    Dispense:  150 mL    Refill:  1   Budesonide 90 MCG/ACT inhaler    Sig: Inhale 1 puff into the lungs 2 (two) times daily.    Dispense:  1 each    Refill:  0     [name], PA-C

## 2021-06-11 ENCOUNTER — Telehealth: Payer: Self-pay | Admitting: Physician Assistant

## 2021-06-11 NOTE — Telephone Encounter (Signed)
Contacted patient to follow up. She states she is feeling better today. The albuterol and steroid injection have helped her. She sounded like she was having an easier time breathing. She is going to come by the office to pick up a different mask for her Nebulizer. AS, CMA

## 2021-06-12 ENCOUNTER — Ambulatory Visit (INDEPENDENT_AMBULATORY_CARE_PROVIDER_SITE_OTHER): Payer: Medicare Other | Admitting: Physician Assistant

## 2021-06-12 ENCOUNTER — Encounter: Payer: Self-pay | Admitting: Physician Assistant

## 2021-06-12 VITALS — BP 117/71 | HR 60 | Temp 98.5°F | Ht 65.0 in | Wt 183.0 lb

## 2021-06-12 DIAGNOSIS — J4541 Moderate persistent asthma with (acute) exacerbation: Secondary | ICD-10-CM | POA: Diagnosis not present

## 2021-06-12 NOTE — Progress Notes (Signed)
Acute Office Visit  Subjective:    Patient ID: Veronica Barrett, female    DOB: [birth year], 70 y.o.   MRN: [id number]  Chief Complaint  Patient presents with   Follow-up   Asthma    HPI Patient is in today for acute asthma exacerbation follow up. Patient reports is feeling better. Wheezing and shortness of breath is improving.  States did a nasal rinse this morning which has helped with nasal congestion.  States has noticed she has a spasm after doing her breathing treatments which can trigger a cough attack, reports abdominal and back soreness from coughing.  Is doing albuterol nebulizer treatments about every 6 hours when taking it are not antihistamine without a decongestant.  Feels like the decongestant was making respiratory symptoms worse.  Has not started inhaled corticosteroid because the pharmacy had to order it.  Past Medical History:  Diagnosis Date   Allergy    seasonal allergy   Arthritis    left hip   Asthma    Depression    HSV infection    vag    Past Surgical History:  Procedure Laterality Date   APPENDECTOMY  1984   EYE SURGERY Left    left vitreous   TOTAL HIP ARTHROPLASTY Left 10/17/2014   Procedure: LEFT TOTAL HIP ARTHROPLASTY ANTERIOR APPROACH;  Surgeon: Mauri Pole, MD;  Location: [facility];  Service: Orthopedics;  Laterality: Left;    Family History  Problem Relation Age of Onset   Cancer Father    Cancer Sister    Thyroid cancer Sister    Arthritis Other    Colon cancer Neg Hx     Social History   Socioeconomic History   Marital status: Married    Spouse name: Not on file   Number of children: Not on file   Years of education: Not on file   Highest education level: Not on file  Occupational History   Not on file  Tobacco Use   Smoking status: Never   Smokeless tobacco: Never  Substance and Sexual Activity   Alcohol use: Yes    Alcohol/week: 10.0 standard drinks    Types: 10 Glasses of wine per week   Drug use: No   Sexual activity:  Not Currently  Other Topics Concern   Not on file  Social History Narrative   Not on file   Social Determinants of Health   Financial Resource Strain: Not on file  Food Insecurity: Not on file  Transportation Needs: Not on file  Physical Activity: Not on file  Stress: Not on file  Social Connections: Not on file  Intimate Partner Violence: Not on file    Outpatient Medications Prior to Visit  Medication Sig Dispense Refill   acyclovir (ZOVIRAX) 200 MG capsule Take 1 capsule (200 mg total) by mouth 5 (five) times daily as needed (breakouts). 100 capsule 3   albuterol (PROVENTIL) (2.5 MG/3ML) 0.083% nebulizer solution Take 3 mLs (2.5 mg total) by nebulization every 6 (six) hours as needed for wheezing or shortness of breath. 75 mL 12   albuterol (PROVENTIL) (2.5 MG/3ML) 0.083% nebulizer solution Take 3 mLs (2.5 mg total) by nebulization every 6 (six) hours as needed for wheezing or shortness of breath. 150 mL 1   albuterol (VENTOLIN HFA) 108 (90 Base) MCG/ACT inhaler Inhale 2 puffs into the lungs every 6 (six) hours as needed for shortness of breath. 1 each 1   alendronate (FOSAMAX) 70 MG tablet Take 1 tablet (70 mg total)  by mouth once a week. Take with a full glass of water on an empty stomach. 12 tablet 1   atorvastatin (LIPITOR) 20 MG tablet Take 1 tablet (20 mg total) by mouth daily. 90 tablet 1   Budesonide 90 MCG/ACT inhaler Inhale 1 puff into the lungs 2 (two) times daily. 1 each 0   citalopram (CELEXA) 20 MG tablet TAKE 1 AND 1/2 TABLETS DAILY BY MOUTH 135 tablet 1   Nebulizer System All-In-One MISC Use with nebulized albuterol once every 4-6 hours for wheezing and shortness of breath. 1 each 0   predniSONE (DELTASONE) 20 MG tablet Take 2 tablets by mouth x 2 days, then 1 tablet x 2 days, then 0.5 tablet x 2 days 7 tablet 0   Respiratory Therapy Supplies (NEBULIZER MASK ADULT) MISC 1 Inhaler by Does not apply route every 4 (four) hours as needed. 1 each 1   Facility-Administered  Medications Prior to Visit  Medication Dose Route Frequency Provider Last Rate Last Admin   cefTRIAXone (ROCEPHIN) injection 500 mg  500 mg Intramuscular Q24H Vignesh Willert, PA-C   500 mg at 06/10/21 1620    Allergies  Allergen Reactions   Seasonal Ic [Cholestatin]    Tape Rash    Review of Systems A fourteen system review of systems was performed and found to be positive as per HPI.    Objective:    Physical Exam General:  Well Developed, well nourished, in no acute distress, nontoxic appearing Neuro:  Alert and oriented,  extra-ocular muscles intact  HEENT:  Normocephalic, atraumatic, neck supple Skin:  no gross rash, warm, pink. Cardiac:  RRR Respiratory: Inspiratory and expiratory wheezing with rhonchi, without crackles or rales. Not using accessory muscles, speaking in full sentences- unlabored. Vascular:  Ext warm, no cyanosis apprec.; cap RF less 2 sec. Psych:  No HI/SI, judgement and insight good, Euthymic mood. Full Affect.  BP 117/71   Pulse 60   Temp 98.5 F (36.9 C)   Ht _0  (1.651 m)   Wt 183 lb (83 kg)   SpO2 96%   BMI 30.45 kg/m  Wt Readings from Last 3 Encounters:  06/12/21 183 lb (83 kg)  06/10/21 183 lb (83 kg)  06/05/21 183 lb 14.4 oz (83.4 kg)    Health Maintenance Due  Topic Date Due   Hepatitis C Screening  Never done   Zoster Vaccines- Shingrix (1 of 2) Never done   PNA vac Low Risk Adult (2 of 2 - PPSV23) 09/01/2019   COVID-19 Vaccine (3 - Pfizer risk series) 02/16/2020    There are no preventive care reminders to display for this patient.   Lab Results  Component Value Date   TSH 2.930 04/05/2021   Lab Results  Component Value Date   WBC 5.5 04/05/2021   HGB 12.8 04/05/2021   HCT 40.1 04/05/2021   MCV 97 04/05/2021   PLT 227 04/05/2021   Lab Results  Component Value Date   NA 141 04/05/2021   K 5.1 04/05/2021   CO2 20 04/05/2021   GLUCOSE 97 04/05/2021   BUN 18 04/05/2021   CREATININE 0.68 04/05/2021   BILITOT 0.5  04/05/2021   ALKPHOS 103 04/05/2021   AST 19 04/05/2021   ALT 22 04/05/2021   PROT 6.7 04/05/2021   ALBUMIN 4.4 04/05/2021   CALCIUM 9.2 04/05/2021   ANIONGAP 9 10/18/2014   EGFR 94 04/05/2021   GFR 84.61 08/24/2018   Lab Results  Component Value Date   CHOL 147 04/05/2021  Lab Results  Component Value Date   HDL 53 04/05/2021   Lab Results  Component Value Date   LDLCALC 80 04/05/2021   Lab Results  Component Value Date   TRIG 71 04/05/2021   Lab Results  Component Value Date   CHOLHDL 2.8 04/05/2021   Lab Results  Component Value Date   HGBA1C 5.3 02/14/2020       Assessment & Plan:   Problem List Items Addressed This Visit   None Visit Diagnoses     Moderate persistent asthma with exacerbation    -  Primary      Moderate persistent asthma with exacerbation: -Improving. Recommend to continue with albuterol inhaler, nebulizer treatments and nasal rinses. Advised to use inhaled corticosteroid as prescribed for 10 days once obtained. Vital signs are stable (oxygen sat 96% RA). -Discussed with patient referral to allergy and asthma specialists for further evaluation especially if symptoms fail to resolve or worsen. Advised would benefit from PFTs to evaluate severity of asthma or evidence of another  etiology such as COPD or bronchospasm. Patient verbalized understanding. Discussed if experiences another exacerbation recommend to consider leukotriene receptor antagonist (montelukast).    No orders of the defined types were placed in this encounter.  Note:  This note was prepared with assistance of Dragon voice recognition software. Occasional wrong-word or sound-a-like substitutions may have occurred due to the inherent limitations of voice recognition software.   [name], PA-C

## 2021-06-17 ENCOUNTER — Other Ambulatory Visit: Payer: Self-pay

## 2021-06-17 ENCOUNTER — Encounter (HOSPITAL_BASED_OUTPATIENT_CLINIC_OR_DEPARTMENT_OTHER): Payer: Self-pay | Admitting: Obstetrics and Gynecology

## 2021-06-17 ENCOUNTER — Emergency Department (HOSPITAL_BASED_OUTPATIENT_CLINIC_OR_DEPARTMENT_OTHER): Payer: Medicare Other | Admitting: Radiology

## 2021-06-17 ENCOUNTER — Ambulatory Visit: Payer: Medicare Other | Admitting: Physician Assistant

## 2021-06-17 ENCOUNTER — Inpatient Hospital Stay (HOSPITAL_BASED_OUTPATIENT_CLINIC_OR_DEPARTMENT_OTHER)
Admission: EM | Admit: 2021-06-17 | Discharge: 2021-06-22 | DRG: 202 | Disposition: A | Discharge status: Home / Self Care | Payer: Medicare Other | Attending: Student | Admitting: Student

## 2021-06-17 DIAGNOSIS — K219 Gastro-esophageal reflux disease without esophagitis: Secondary | ICD-10-CM | POA: Diagnosis present

## 2021-06-17 DIAGNOSIS — F419 Anxiety disorder, unspecified: Secondary | ICD-10-CM | POA: Diagnosis not present

## 2021-06-17 DIAGNOSIS — R0781 Pleurodynia: Secondary | ICD-10-CM

## 2021-06-17 DIAGNOSIS — E669 Obesity, unspecified: Secondary | ICD-10-CM | POA: Diagnosis not present

## 2021-06-17 DIAGNOSIS — J45901 Unspecified asthma with (acute) exacerbation: Secondary | ICD-10-CM | POA: Insufficient documentation

## 2021-06-17 DIAGNOSIS — Z20822 Contact with and (suspected) exposure to covid-19: Secondary | ICD-10-CM | POA: Diagnosis not present

## 2021-06-17 DIAGNOSIS — J3089 Other allergic rhinitis: Secondary | ICD-10-CM | POA: Diagnosis present

## 2021-06-17 DIAGNOSIS — Z91048 Other nonmedicinal substance allergy status: Secondary | ICD-10-CM | POA: Diagnosis not present

## 2021-06-17 DIAGNOSIS — R059 Cough, unspecified: Secondary | ICD-10-CM | POA: Diagnosis not present

## 2021-06-17 DIAGNOSIS — Z79899 Other long term (current) drug therapy: Secondary | ICD-10-CM | POA: Diagnosis not present

## 2021-06-17 DIAGNOSIS — Z888 Allergy status to other drugs, medicaments and biological substances status: Secondary | ICD-10-CM

## 2021-06-17 DIAGNOSIS — X503XXA Overexertion from repetitive movements, initial encounter: Secondary | ICD-10-CM

## 2021-06-17 DIAGNOSIS — S29011A Strain of muscle and tendon of front wall of thorax, initial encounter: Secondary | ICD-10-CM | POA: Diagnosis not present

## 2021-06-17 DIAGNOSIS — D72829 Elevated white blood cell count, unspecified: Secondary | ICD-10-CM | POA: Diagnosis not present

## 2021-06-17 DIAGNOSIS — J40 Bronchitis, not specified as acute or chronic: Secondary | ICD-10-CM | POA: Diagnosis present

## 2021-06-17 DIAGNOSIS — J440 Chronic obstructive pulmonary disease with acute lower respiratory infection: Secondary | ICD-10-CM | POA: Diagnosis present

## 2021-06-17 DIAGNOSIS — F32A Depression, unspecified: Secondary | ICD-10-CM | POA: Diagnosis present

## 2021-06-17 DIAGNOSIS — J208 Acute bronchitis due to other specified organisms: Secondary | ICD-10-CM | POA: Diagnosis not present

## 2021-06-17 DIAGNOSIS — J9601 Acute respiratory failure with hypoxia: Secondary | ICD-10-CM | POA: Diagnosis not present

## 2021-06-17 DIAGNOSIS — E785 Hyperlipidemia, unspecified: Secondary | ICD-10-CM | POA: Diagnosis not present

## 2021-06-17 DIAGNOSIS — J209 Acute bronchitis, unspecified: Secondary | ICD-10-CM | POA: Diagnosis not present

## 2021-06-17 DIAGNOSIS — R0602 Shortness of breath: Secondary | ICD-10-CM | POA: Diagnosis not present

## 2021-06-17 DIAGNOSIS — R079 Chest pain, unspecified: Secondary | ICD-10-CM | POA: Diagnosis not present

## 2021-06-17 DIAGNOSIS — Z6830 Body mass index (BMI) 30.0-30.9, adult: Secondary | ICD-10-CM

## 2021-06-17 DIAGNOSIS — F339 Major depressive disorder, recurrent, unspecified: Secondary | ICD-10-CM | POA: Diagnosis present

## 2021-06-17 LAB — BASIC METABOLIC PANEL
Anion gap: 11 (ref 5–15)
BUN: 7 mg/dL — ABNORMAL LOW (ref 8–23)
CO2: 26 mmol/L (ref 22–32)
Calcium: 9.3 mg/dL (ref 8.9–10.3)
Chloride: 100 mmol/L (ref 98–111)
Creatinine, Ser: 0.64 mg/dL (ref 0.44–1.00)
GFR, Estimated: >60 mL/min (ref >60)
Glucose, Bld: 102 mg/dL — ABNORMAL HIGH (ref 70–99)
Potassium: 3.7 mmol/L (ref 3.5–5.1)
Sodium: 137 mmol/L (ref 135–145)

## 2021-06-17 LAB — CBC
HCT: 41.2 % (ref 36.0–46.0)
Hemoglobin: 13.4 g/dL (ref 12.0–15.0)
MCH: 31.4 pg (ref 26.0–34.0)
MCHC: 32.5 g/dL (ref 30.0–36.0)
MCV: 96.5 fL (ref 80.0–100.0)
Platelets: 250 10*3/uL (ref 150–400)
RBC: 4.27 MIL/uL (ref 3.87–5.11)
RDW: 13.1 % (ref 11.5–15.5)
WBC: 16.3 10*3/uL — ABNORMAL HIGH (ref 4.0–10.5)
nRBC: 0 % (ref 0.0–0.2)

## 2021-06-17 LAB — RESP PANEL BY RT-PCR (FLU A&B, COVID) ARPGX2
Influenza A by PCR: NEGATIVE
Influenza B by PCR: NEGATIVE
SARS Coronavirus 2 by RT PCR: NEGATIVE

## 2021-06-17 MED ORDER — SENNOSIDES-DOCUSATE SODIUM 8.6-50 MG PO TABS: 1.0000 | ORAL_TABLET | Freq: Every evening | ORAL | Status: DC | PRN

## 2021-06-17 MED ORDER — GUAIFENESIN-DM 100-10 MG/5ML PO SYRP: 5.0000 mL | ORAL_SOLUTION | ORAL | Status: DC | PRN

## 2021-06-17 MED ORDER — ACETAMINOPHEN 325 MG PO TABS
650.0000 mg | ORAL_TABLET | Freq: Four times a day (QID) | ORAL | Status: DC | PRN
  Administered 2021-06-17 – 2021-06-21 (×7): 650 mg via ORAL
  Filled 2021-06-17 (×7): qty 2

## 2021-06-17 MED ORDER — METHYLPREDNISOLONE SODIUM SUCC 40 MG IJ SOLR
40.0000 mg | Freq: Two times a day (BID) | INTRAMUSCULAR | Status: DC
  Administered 2021-06-17 – 2021-06-18 (×2): 40 mg via INTRAVENOUS
  Filled 2021-06-17 (×2): qty 1

## 2021-06-17 MED ORDER — ACETAMINOPHEN 650 MG RE SUPP: 650.0000 mg | Freq: Four times a day (QID) | RECTAL | Status: DC | PRN

## 2021-06-17 MED ORDER — ONDANSETRON HCL 4 MG PO TABS: 4.0000 mg | ORAL_TABLET | Freq: Four times a day (QID) | ORAL | Status: DC | PRN

## 2021-06-17 MED ORDER — METHYLPREDNISOLONE SODIUM SUCC 125 MG IJ SOLR
125.0000 mg | Freq: Once | INTRAMUSCULAR | Status: AC
  Administered 2021-06-17: 125 mg via INTRAVENOUS
  Filled 2021-06-17: qty 2

## 2021-06-17 MED ORDER — IPRATROPIUM BROMIDE 0.02 % IN SOLN
0.5000 mg | Freq: Once | RESPIRATORY_TRACT | Status: AC
  Administered 2021-06-17: 0.5 mg via RESPIRATORY_TRACT
  Filled 2021-06-17: qty 2.5

## 2021-06-17 MED ORDER — ONDANSETRON HCL 4 MG/2ML IJ SOLN: 4.0000 mg | Freq: Four times a day (QID) | INTRAMUSCULAR | Status: DC | PRN

## 2021-06-17 MED ORDER — LORATADINE 10 MG PO TABS
10.0000 mg | ORAL_TABLET | Freq: Every day | ORAL | Status: DC | PRN
  Administered 2021-06-17 – 2021-06-18 (×2): 10 mg via ORAL
  Filled 2021-06-17 (×2): qty 1

## 2021-06-17 MED ORDER — ATORVASTATIN CALCIUM 20 MG PO TABS
20.0000 mg | ORAL_TABLET | Freq: Every day | ORAL | Status: DC
  Administered 2021-06-17 – 2021-06-21 (×5): 20 mg via ORAL
  Filled 2021-06-17 (×6): qty 1

## 2021-06-17 MED ORDER — HYDROCODONE-ACETAMINOPHEN 5-325 MG PO TABS
1.0000 | ORAL_TABLET | Freq: Four times a day (QID) | ORAL | Status: DC | PRN
  Administered 2021-06-18 – 2021-06-21 (×2): 1 via ORAL
  Filled 2021-06-17 (×3): qty 1

## 2021-06-17 MED ORDER — MELATONIN 3 MG PO TABS: 3.0000 mg | ORAL_TABLET | Freq: Every evening | ORAL | Status: DC | PRN

## 2021-06-17 MED ORDER — ACETAMINOPHEN 325 MG PO TABS
650.0000 mg | ORAL_TABLET | Freq: Once | ORAL | Status: AC
  Administered 2021-06-17: 650 mg via ORAL
  Filled 2021-06-17: qty 2

## 2021-06-17 MED ORDER — ALBUTEROL SULFATE (2.5 MG/3ML) 0.083% IN NEBU
5.0000 mg | INHALATION_SOLUTION | RESPIRATORY_TRACT | Status: DC | PRN
  Administered 2021-06-17: 5 mg via RESPIRATORY_TRACT
  Filled 2021-06-17: qty 6

## 2021-06-17 MED ORDER — ENOXAPARIN SODIUM 40 MG/0.4ML IJ SOSY
40.0000 mg | PREFILLED_SYRINGE | INTRAMUSCULAR | Status: DC
  Administered 2021-06-17 – 2021-06-21 (×5): 40 mg via SUBCUTANEOUS
  Filled 2021-06-17 (×6): qty 0.4

## 2021-06-17 MED ORDER — CITALOPRAM HYDROBROMIDE 20 MG PO TABS
30.0000 mg | ORAL_TABLET | Freq: Every day | ORAL | Status: DC
  Administered 2021-06-17 – 2021-06-21 (×5): 30 mg via ORAL
  Filled 2021-06-17 (×6): qty 2

## 2021-06-17 MED ORDER — ALBUTEROL SULFATE (2.5 MG/3ML) 0.083% IN NEBU
2.5000 mg | INHALATION_SOLUTION | RESPIRATORY_TRACT | Status: DC | PRN
  Administered 2021-06-18: 2.5 mg via RESPIRATORY_TRACT
  Filled 2021-06-17: qty 3

## 2021-06-17 NOTE — H&P (Signed)
History and Physical    Veronica Barrett [id number] DOB: 1951-08-27 DOA: 06/17/2021  PCP: [name], PA-C   Patient coming from: Home   Chief Complaint: SOB, cough, wheezing   HPI: Veronica Barrett is a 70 y.o. female with medical history significant for seasonal allergies, anxiety, and depression, now presenting to the emergency department with cough, wheezing, and shortness of breath.  Patient reports that she had been doing well until roughly 2 weeks ago when she developed productive cough and wheezing.  She had an albuterol inhaler that she was prescribed several years ago for suspected asthma, had only fleeting relief with that, and was seen at an outpatient clinic where she was started on steroids.  She was then seen at an urgent care a week ago with persistent symptoms, was given additional steroids and nebulized breathing treatments.  Her cough has improved slightly and she is not producing thick sputum that she was initially, but she has continued to have wheezing and shortness of breath with any exertion.  She reports approximately an hour of improvement after nebulized albuterol.  She continues to be dyspneic with minimal exertion, prompting her presentation to the ED.  She has not had fever or chills throughout this illness.  She was not experiencing any lower extremity swelling or tenderness.  She developed some pain in her left lateral chest and flank when coughing she attributes to pulled muscle.  [facility] ED Course: Upon arrival to the ED, patient is found to be afebrile, saturating low 90s on room air, slightly tachypneic, and with stable blood pressure.  EKG features sinus rhythm.  Chest x-ray negative for acute cardiopulmonary disease.  Patient was treated with albuterol, Atrovent, IV Solu-Medrol, and acetaminophen in the ED.  She had some improvement but remained dyspneic, was placed on supplemental oxygen, and transferred to [facility] for further observation and  treatment.  Review of Systems:  All other systems reviewed and apart from HPI, are negative.  Past Medical History:  Diagnosis Date   Allergy    seasonal allergy   Arthritis    left hip   Asthma    Depression    HSV infection    vag    Past Surgical History:  Procedure Laterality Date   APPENDECTOMY  1984   EYE SURGERY Left    left vitreous   TOTAL HIP ARTHROPLASTY Left 10/17/2014   Procedure: LEFT TOTAL HIP ARTHROPLASTY ANTERIOR APPROACH;  Surgeon: Mauri Pole, MD;  Location: [facility];  Service: Orthopedics;  Laterality: Left;    Social History:   reports that she has never smoked. She has never used smokeless tobacco. She reports current alcohol use of about 10.0 standard drinks of alcohol per week. She reports that she does not use drugs.  Allergies  Allergen Reactions   Seasonal Ic [Cholestatin]    Tape Rash    Family History  Problem Relation Age of Onset   Cancer Father    Cancer Sister    Thyroid cancer Sister    Arthritis Other    Colon cancer Neg Hx      Prior to Admission medications   Medication Sig Start Date End Date Taking? Authorizing Provider  acyclovir (ZOVIRAX) 200 MG capsule Take 1 capsule (200 mg total) by mouth 5 (five) times daily as needed (breakouts). 08/24/18   [name], MD  albuterol (PROVENTIL) (2.5 MG/3ML) 0.083% nebulizer solution Take 3 mLs (2.5 mg total) by nebulization every 6 (six) hours as needed for wheezing  or shortness of breath. 06/09/21   Jaynee Eagles, PA-C  albuterol (PROVENTIL) (2.5 MG/3ML) 0.083% nebulizer solution Take 3 mLs (2.5 mg total) by nebulization every 6 (six) hours as needed for wheezing or shortness of breath. 06/10/21   [name], PA-C  albuterol (VENTOLIN HFA) 108 (90 Base) MCG/ACT inhaler Inhale 2 puffs into the lungs every 6 (six) hours as needed for shortness of breath. 04/12/21   Abonza, Maritza, PA-C  alendronate (FOSAMAX) 70 MG tablet Take 1 tablet (70 mg total) by mouth once a week. Take with a  full glass of water on an empty stomach. 06/05/21   Abonza, Maritza, PA-C  atorvastatin (LIPITOR) 20 MG tablet Take 1 tablet (20 mg total) by mouth daily. 12/04/20   [name], PA-C  Budesonide 90 MCG/ACT inhaler Inhale 1 puff into the lungs 2 (two) times daily. 06/10/21   [name], PA-C  citalopram (CELEXA) 20 MG tablet TAKE 1 AND 1/2 TABLETS DAILY BY MOUTH 12/27/20   [name], PA-C  Nebulizer System All-In-One MISC Use with nebulized albuterol once every 4-6 hours for wheezing and shortness of breath. 06/09/21   Jaynee Eagles, PA-C  predniSONE (DELTASONE) 20 MG tablet Take 2 tablets by mouth x 2 days, then 1 tablet x 2 days, then 0.5 tablet x 2 days 06/05/21   [name], PA-C  Respiratory Therapy Supplies (NEBULIZER MASK ADULT) MISC 1 Inhaler by Does not apply route every 4 (four) hours as needed. 06/09/21   Jaynee Eagles, PA-C    Physical Exam: Vitals:   06/17/21 1745 06/17/21 1835 06/17/21 1900 06/17/21 2017  BP: 113/60 113/66 109/83 138/77  Pulse: 71 66 66 65  Resp: '17 17 17 16  '$ Temp:  99 F (37.2 C)  99 F (37.2 C)  TempSrc:    Oral  SpO2: 96% 96% 91% 95%    Constitutional: NAD, calm  Eyes: PERTLA, lids and conjunctivae normal ENMT: Mucous membranes are moist. Posterior pharynx clear of any exudate or lesions.   Neck:  supple, no masses  Respiratory: Dyspneic with speech. Prolonged expiratory phase with wheezes. No cyanosis.  Cardiovascular: S1 & S2 heard, regular rate and rhythm. No extremity edema.   Abdomen: No distension, no tenderness, soft. Bowel sounds active.  Musculoskeletal: no clubbing / cyanosis. No joint deformity upper and lower extremities.   Skin: no significant rashes, lesions, ulcers. Warm, dry, well-perfused. Neurologic: CN 2-12 grossly intact. Sensation intact. Moving all extremities.  Psychiatric: Alert and oriented to person, place, and situation. Calm and cooperative.    Labs and Imaging on Admission: I have personally reviewed following  labs and imaging studies  CBC: Recent Labs  Lab 06/17/21 1130  WBC 16.3*  HGB 13.4  HCT 41.2  MCV 96.5  PLT [id number]   Basic Metabolic Panel: Recent Labs  Lab 06/17/21 1130  NA 137  K 3.7  CL 100  CO2 26  GLUCOSE 102*  BUN 7*  CREATININE 0.64  CALCIUM 9.3   GFR: Estimated Creatinine Clearance: 70.6 mL/min (by C-G formula based on SCr of 0.64 mg/dL). Liver Function Tests: No results for input(s): AST, ALT, ALKPHOS, BILITOT, PROT, ALBUMIN in the last 168 hours. No results for input(s): LIPASE, AMYLASE in the last 168 hours. No results for input(s): AMMONIA in the last 168 hours. Coagulation Profile: No results for input(s): INR, PROTIME in the last 168 hours. Cardiac Enzymes: No results for input(s): CKTOTAL, CKMB, CKMBINDEX, TROPONINI in the last 168 hours. BNP (last 3 results) No results for input(s): PROBNP in  the last 8760 hours. HbA1C: No results for input(s): HGBA1C in the last 72 hours. CBG: No results for input(s): GLUCAP in the last 168 hours. Lipid Profile: No results for input(s): CHOL, HDL, LDLCALC, TRIG, CHOLHDL, LDLDIRECT in the last 72 hours. Thyroid Function Tests: No results for input(s): TSH, T4TOTAL, FREET4, T3FREE, THYROIDAB in the last 72 hours. Anemia Panel: No results for input(s): VITAMINB12, FOLATE, FERRITIN, TIBC, IRON, RETICCTPCT in the last 72 hours. Urine analysis:    Component Value Date/Time   COLORURINE YELLOW 10/10/2014 1100   APPEARANCEUR CLEAR 10/10/2014 1100   LABSPEC 1.015 10/10/2014 1100   PHURINE 7.0 10/10/2014 1100   GLUCOSEU NEGATIVE 10/10/2014 1100   HGBUR NEGATIVE 10/10/2014 1100   BILIRUBINUR n 08/24/2018 1510   KETONESUR NEGATIVE 10/10/2014 1100   PROTEINUR Negative 08/24/2018 1510   PROTEINUR NEGATIVE 10/10/2014 1100   UROBILINOGEN 0.2 08/24/2018 1510   UROBILINOGEN 0.2 10/10/2014 1100   NITRITE n 08/24/2018 1510   NITRITE NEGATIVE 10/10/2014 1100   LEUKOCYTESUR Negative 08/24/2018 1510   Sepsis  Labs: '@LABRCNTIP'$ (procalcitonin:4,lacticidven:4) ) Recent Results (from the past 240 hour(s))  Novel Coronavirus, NAA ([facility])     Status: None   Collection Time: 06/09/21 10:01 AM   Specimen: Nasopharyngeal Swab; Nasopharyngeal(NP) swabs in vial transport medium   Nasopharynge  Result Value Ref Range Status   SARS-CoV-2, NAA Not Detected Not Detected Final    Comment: This nucleic acid amplification test was developed and its performance characteristics determined by [employer]. Nucleic acid amplification tests include RT-PCR and TMA. This test has not been FDA cleared or approved. This test has been authorized by FDA under an Emergency Use Authorization (EUA). This test is only authorized for the duration of time the declaration that circumstances exist justifying the authorization of the emergency use of in vitro diagnostic tests for detection of SARS-CoV-2 virus and/or diagnosis of COVID-19 infection under section 564(b)(1) of the Act, 21 U.S.C. [id number]) (1), unless the authorization is terminated or revoked sooner. When diagnostic testing is negative, the possibility of a false negative result should be considered in the context of a patient's recent exposures and the presence of clinical signs and symptoms consistent with COVID-19. An individual without symptoms of COVID-19 and who is not shedding SARS-CoV-2 virus wo uld expect to have a negative (not detected) result in this assay.   SARS-COV-2, NAA 2 DAY TAT     Status: None   Collection Time: 06/09/21 10:01 AM   Nasopharynge  Result Value Ref Range Status   SARS-CoV-2, NAA 2 DAY TAT Performed  Final  Resp Panel by RT-PCR (Flu A&B, Covid) Nasopharyngeal Swab     Status: None   Collection Time: 06/17/21 12:20 PM   Specimen: Nasopharyngeal Swab; Nasopharyngeal(NP) swabs in vial transport medium  Result Value Ref Range Status   SARS Coronavirus 2 by RT PCR NEGATIVE NEGATIVE Final    Comment: (NOTE) SARS-CoV-2  target nucleic acids are NOT DETECTED.  The SARS-CoV-2 RNA is generally detectable in upper respiratory specimens during the acute phase of infection. The lowest concentration of SARS-CoV-2 viral copies this assay can detect is 138 copies/mL. A negative result does not preclude SARS-Cov-2 infection and should not be used as the sole basis for treatment or other patient management decisions. A negative result may occur with  improper specimen collection/handling, submission of specimen other than nasopharyngeal swab, presence of viral mutation(s) within the areas targeted by this assay, and inadequate number of viral copies(<138 copies/mL). A negative result must be combined with  clinical observations, patient history, and epidemiological information. The expected result is Negative.  Fact Sheet for Patients:  EntrepreneurPulse.com.au  Fact Sheet for Healthcare Providers:  IncredibleEmployment.be  This test is no t yet approved or cleared by the [place] FDA and  has been authorized for detection and/or diagnosis of SARS-CoV-2 by FDA under an Emergency Use Authorization (EUA). This EUA will remain  in effect (meaning this test can be used) for the duration of the COVID-19 declaration under Section 564(b)(1) of the Act, 21 U.S.C.section 360bbb-3(b)(1), unless the authorization is terminated  or revoked sooner.       Influenza A by PCR NEGATIVE NEGATIVE Final   Influenza B by PCR NEGATIVE NEGATIVE Final    Comment: (NOTE) The Xpert Xpress SARS-CoV-2/FLU/RSV plus assay is intended as an aid in the diagnosis of influenza from Nasopharyngeal swab specimens and should not be used as a sole basis for treatment. Nasal washings and aspirates are unacceptable for Xpert Xpress SARS-CoV-2/FLU/RSV testing.  Fact Sheet for Patients: EntrepreneurPulse.com.au  Fact Sheet for Healthcare  Providers: IncredibleEmployment.be  This test is not yet approved or cleared by the [place] FDA and has been authorized for detection and/or diagnosis of SARS-CoV-2 by FDA under an Emergency Use Authorization (EUA). This EUA will remain in effect (meaning this test can be used) for the duration of the COVID-19 declaration under Section 564(b)(1) of the Act, 21 U.S.C. section 360bbb-3(b)(1), unless the authorization is terminated or revoked.  Performed at [facility], [address], [place], [place] [address]      Radiological Exams on Admission: DG Chest 2 View  Result Date: 06/17/2021 CLINICAL DATA:  Shortness of breath, cough, left-sided chest pain EXAM: CHEST - 2 VIEW COMPARISON:  06/09/2021 FINDINGS: The heart size and mediastinal contours are within normal limits. Both lungs are clear. The visualized skeletal structures are unremarkable. IMPRESSION: No acute abnormality of the lungs. Electronically Signed   By: Eddie Candle M.D.   On: 06/17/2021 12:11    EKG: Independently reviewed. Sinus rhythm.   Assessment/Plan   1. Acute asthma exacerbation  - Presents with persistent SOB and wheezing despite steroids and breathing treatments at home  - She has hx of allergic rhinitis and was suspected to have asthma several yrs ago but had not needed albuterol again until 2 wks ago  - She reports improvement with steroids and nebs in ED but remains dyspneic with minimal exertion  - Continue systemic steroids and breathing treatments    2. Leukocytosis  - WBC is 16,300 on admission  - No fevers, CXR clear, likely from steroids  - Monitor, culture if febrile    3. Depression  - Continue Celexa    DVT prophylaxis: Lovenox  Code Status: Full  Level of Care: Level of care: Med-Surg Family Communication: none present  Disposition Plan:  Patient is from: Home  Anticipated d/c is to: Home  Anticipated d/c date is: Possibly as early as  06/18/21 Patient currently: Pending improvement in respiratory status  Consults called: None  Admission status: Observation     Vianne Bulls, MD [facility]  06/17/2021, 9:04 PM

## 2021-06-17 NOTE — ED Notes (Signed)
Report given to [facility]. 

## 2021-06-17 NOTE — ED Notes (Signed)
Med aerosol tx. scanned @ [id number],

## 2021-06-17 NOTE — ED Triage Notes (Signed)
Patient reports she has been sick x2 weeks. Patient reports she has had steroids and nebulizer and has not been feeling well still. Patient was given a shot of antibiotic. Patient has been doing nebulizer treatments every 6 hours and has continued to worsen. Patient got Pulmicort which is also not reliving the sickness

## 2021-06-17 NOTE — ED Notes (Signed)
Report called to [facility]. Pt to go to room 1312

## 2021-06-17 NOTE — ED Provider Notes (Signed)
[facility] EMERGENCY DEPT Provider Note   CSN: [id number] Arrival date & time: 06/17/21  1112     History Chief Complaint  Patient presents with   Shortness of Breath    Veronica Barrett is a 70 y.o. female.   Shortness of Breath  Patient states she has been having symptoms for the last couple of weeks now.  Patient has been having cough congestion and shortness of breath.  She has been wheezing and does have a lot of mucus in her chest.  She did not has seen her doctor and has gone through several rounds of treatment.  She has been given antibiotics.  She has been given steroids as well as a nebulizer.  Feels like the symptoms are slightly decreased but she still has significant shortness of breath.  She takes a breathing treatment and only seems like it lasts an hour or so.  Even after the treatment she still does not feel normal.  She has not noticed any leg swelling.  She is now having some discomfort on the left side of her chest.  She feels like she may have injured her self.  The pain is sharp and increases with palpation and movement.  No known fevers.  She has tested negative for COVID.  Past Medical History:  Diagnosis Date   Allergy    seasonal allergy   Arthritis    left hip   Asthma    Depression    HSV infection    vag    Patient Active Problem List   Diagnosis Date Noted   Pain in right foot 11/27/2020   Environmental and seasonal allergies 10/07/2018   Chronic sinusitis 10/07/2018   H/O sinus surgery 10/07/2018   Hyperlipidemia 10/07/2018   S/P left THA, AA 10/17/2014   Recurrent HSV (herpes simplex virus) 04/21/2013   Depression, recurrent ([place]) 02/20/2010   Allergic rhinitis 04/16/2007    Past Surgical History:  Procedure Laterality Date   APPENDECTOMY  1984   EYE SURGERY Left    left vitreous   TOTAL HIP ARTHROPLASTY Left 10/17/2014   Procedure: LEFT TOTAL HIP ARTHROPLASTY ANTERIOR APPROACH;  Surgeon: Mauri Pole, MD;  Location: [facility];  Service: Orthopedics;  Laterality: Left;     OB History     Gravida      Para      Term      Preterm      AB      Living  2      SAB      IAB      Ectopic      Multiple      Live Births              Family History  Problem Relation Age of Onset   Cancer Father    Cancer Sister    Thyroid cancer Sister    Arthritis Other    Colon cancer Neg Hx     Social History   Tobacco Use   Smoking status: Never   Smokeless tobacco: Never  Vaping Use   Vaping Use: Never used  Substance Use Topics   Alcohol use: Yes    Alcohol/week: 10.0 standard drinks    Types: 10 Glasses of wine per week   Drug use: No    Home Medications Prior to Admission medications   Medication Sig Start Date End Date Taking? Authorizing Provider  acyclovir (ZOVIRAX) 200 MG capsule Take 1 capsule (200 mg total) by mouth  5 (five) times daily as needed (breakouts). 08/24/18   [name], MD  albuterol (PROVENTIL) (2.5 MG/3ML) 0.083% nebulizer solution Take 3 mLs (2.5 mg total) by nebulization every 6 (six) hours as needed for wheezing or shortness of breath. 06/09/21   Jaynee Eagles, PA-C  albuterol (PROVENTIL) (2.5 MG/3ML) 0.083% nebulizer solution Take 3 mLs (2.5 mg total) by nebulization every 6 (six) hours as needed for wheezing or shortness of breath. 06/10/21   [name], PA-C  albuterol (VENTOLIN HFA) 108 (90 Base) MCG/ACT inhaler Inhale 2 puffs into the lungs every 6 (six) hours as needed for shortness of breath. 04/12/21   Abonza, Maritza, PA-C  alendronate (FOSAMAX) 70 MG tablet Take 1 tablet (70 mg total) by mouth once a week. Take with a full glass of water on an empty stomach. 06/05/21   Abonza, Maritza, PA-C  atorvastatin (LIPITOR) 20 MG tablet Take 1 tablet (20 mg total) by mouth daily. 12/04/20   [name], PA-C  Budesonide 90 MCG/ACT inhaler Inhale 1 puff into the lungs 2 (two) times daily. 06/10/21   [name], PA-C  citalopram (CELEXA) 20 MG tablet TAKE 1  AND 1/2 TABLETS DAILY BY MOUTH 12/27/20   [name], PA-C  Nebulizer System All-In-One MISC Use with nebulized albuterol once every 4-6 hours for wheezing and shortness of breath. 06/09/21   Jaynee Eagles, PA-C  predniSONE (DELTASONE) 20 MG tablet Take 2 tablets by mouth x 2 days, then 1 tablet x 2 days, then 0.5 tablet x 2 days 06/05/21   [name], PA-C  Respiratory Therapy Supplies (NEBULIZER MASK ADULT) MISC 1 Inhaler by Does not apply route every 4 (four) hours as needed. 06/09/21   Jaynee Eagles, PA-C    Allergies    Seasonal ic [cholestatin] and Tape  Review of Systems   Review of Systems  Respiratory:  Positive for shortness of breath.   All other systems reviewed and are negative.  Physical Exam Updated Vital Signs BP 123/79   Pulse 70   Temp 98.5 F (36.9 C) (Oral)   Resp 16   SpO2 96%   Physical Exam Vitals and nursing note reviewed.  Constitutional:      General: She is not in acute distress.    Appearance: She is well-developed.  HENT:     Head: Normocephalic and atraumatic.     Right Ear: External ear normal.     Left Ear: External ear normal.  Eyes:     General: No scleral icterus.       Right eye: No discharge.        Left eye: No discharge.     Conjunctiva/sclera: Conjunctivae normal.  Neck:     Trachea: No tracheal deviation.  Cardiovascular:     Rate and Rhythm: Normal rate and regular rhythm.  Pulmonary:     Effort: Pulmonary effort is normal. No respiratory distress.     Breath sounds: No stridor. Decreased breath sounds and wheezing present. No rales.  Chest:     Comments: Tenderness palpation left chest wall Abdominal:     General: Bowel sounds are normal. There is no distension.     Palpations: Abdomen is soft.     Tenderness: There is no abdominal tenderness. There is no guarding or rebound.  Musculoskeletal:        General: No tenderness or deformity.     Cervical back: Neck supple.  Skin:    General: Skin is warm and dry.      Findings: No  rash.  Neurological:     General: No focal deficit present.     Mental Status: She is alert.     Cranial Nerves: No cranial nerve deficit (no facial droop, extraocular movements intact, no slurred speech).     Sensory: No sensory deficit.     Motor: No abnormal muscle tone or seizure activity.     Coordination: Coordination normal.  Psychiatric:        Mood and Affect: Mood normal.    ED Results / Procedures / Treatments   Labs (all labs ordered are listed, but only abnormal results are displayed) Labs Reviewed  BASIC METABOLIC PANEL - Abnormal; Notable for the following components:      Result Value   Glucose, Bld 102 (*)    BUN 7 (*)    All other components within normal limits  CBC - Abnormal; Notable for the following components:   WBC 16.3 (*)    All other components within normal limits  RESP PANEL BY RT-PCR (FLU A&B, COVID) [id number]  POC SARS CORONAVIRUS 2 AG -  ED    EKG None  Radiology DG Chest 2 View  Result Date: 06/17/2021 CLINICAL DATA:  Shortness of breath, cough, left-sided chest pain EXAM: CHEST - 2 VIEW COMPARISON:  06/09/2021 FINDINGS: The heart size and mediastinal contours are within normal limits. Both lungs are clear. The visualized skeletal structures are unremarkable. IMPRESSION: No acute abnormality of the lungs. Electronically Signed   By: Eddie Candle M.D.   On: 06/17/2021 12:11    Procedures Procedures   Medications Ordered in ED Medications  albuterol (PROVENTIL) (2.5 MG/3ML) 0.083% nebulizer solution 5 mg (5 mg Nebulization Given 06/17/21 1151)  methylPREDNISolone sodium succinate (SOLU-MEDROL) 125 mg/2 mL injection 125 mg (125 mg Intravenous Given 06/17/21 1146)  ipratropium (ATROVENT) nebulizer solution 0.5 mg (0.5 mg Nebulization Given 06/17/21 1151)    ED Course  I have reviewed the triage vital signs and the nursing notes.  Pertinent labs & imaging results that were available during my care of the patient were reviewed by me and  considered in my medical decision making (see chart for details).  Clinical Course as of 06/17/21 1431  Mon Jun 17, 2021  1429 Laboratory tests show elevated white blood cell count.  COVID and flu are negative.  Chest x-ray without acute findings. [id number] Patient is feeling somewhat better after treatment but she still has some wheezing.  Patient does have an oxygen requirement [JK]    Clinical Course User Index [JK] Dorie Rank, MD   MDM Rules/Calculators/A&P                           Patient presented to the ED for evaluation of shortness of breath.  Patient has been treated as an outpatient for the last couple weeks with no improvement.  In the ED the patient had significant wheezing and increased work of breathing.  Patient's oxygen saturation is also decreased in the low 90s.  She was started on supplemental nasal cannula oxygen and has noted improvement.  No signs of pneumonia.  Doubt CHF.  COVID and flu are negative.  Suspect symptoms are related to bronchitis with bronchospasm.  She does have history of allergies.  We will consult the medical service for admission and continued treatment of her breathing difficulties Final Clinical Impression(s) / ED Diagnoses Final diagnoses:  Bronchitis with bronchospasm     Dorie Rank, MD 06/17/21 1431

## 2021-06-17 NOTE — ED Notes (Signed)
Med aerosol given at this time, pt. to Xray first before tx. Given.

## 2021-06-17 NOTE — ED Notes (Signed)
Pt ambulated to bathroom with minimal assistance.

## 2021-06-18 DIAGNOSIS — J3089 Other allergic rhinitis: Secondary | ICD-10-CM | POA: Diagnosis not present

## 2021-06-18 DIAGNOSIS — J9601 Acute respiratory failure with hypoxia: Secondary | ICD-10-CM | POA: Diagnosis present

## 2021-06-18 DIAGNOSIS — J208 Acute bronchitis due to other specified organisms: Secondary | ICD-10-CM | POA: Diagnosis present

## 2021-06-18 DIAGNOSIS — J40 Bronchitis, not specified as acute or chronic: Secondary | ICD-10-CM | POA: Diagnosis present

## 2021-06-18 DIAGNOSIS — Z888 Allergy status to other drugs, medicaments and biological substances status: Secondary | ICD-10-CM | POA: Diagnosis not present

## 2021-06-18 DIAGNOSIS — Z91048 Other nonmedicinal substance allergy status: Secondary | ICD-10-CM | POA: Diagnosis not present

## 2021-06-18 DIAGNOSIS — E785 Hyperlipidemia, unspecified: Secondary | ICD-10-CM | POA: Diagnosis present

## 2021-06-18 DIAGNOSIS — X503XXA Overexertion from repetitive movements, initial encounter: Secondary | ICD-10-CM | POA: Diagnosis not present

## 2021-06-18 DIAGNOSIS — K219 Gastro-esophageal reflux disease without esophagitis: Secondary | ICD-10-CM | POA: Diagnosis present

## 2021-06-18 DIAGNOSIS — R0781 Pleurodynia: Secondary | ICD-10-CM | POA: Diagnosis not present

## 2021-06-18 DIAGNOSIS — Z79899 Other long term (current) drug therapy: Secondary | ICD-10-CM | POA: Diagnosis not present

## 2021-06-18 DIAGNOSIS — Z20822 Contact with and (suspected) exposure to covid-19: Secondary | ICD-10-CM | POA: Diagnosis present

## 2021-06-18 DIAGNOSIS — J209 Acute bronchitis, unspecified: Secondary | ICD-10-CM | POA: Diagnosis not present

## 2021-06-18 DIAGNOSIS — E669 Obesity, unspecified: Secondary | ICD-10-CM | POA: Diagnosis present

## 2021-06-18 DIAGNOSIS — D72825 Bandemia: Secondary | ICD-10-CM | POA: Diagnosis not present

## 2021-06-18 DIAGNOSIS — J9621 Acute and chronic respiratory failure with hypoxia: Secondary | ICD-10-CM | POA: Diagnosis not present

## 2021-06-18 DIAGNOSIS — F419 Anxiety disorder, unspecified: Secondary | ICD-10-CM | POA: Diagnosis present

## 2021-06-18 DIAGNOSIS — J45901 Unspecified asthma with (acute) exacerbation: Secondary | ICD-10-CM | POA: Diagnosis present

## 2021-06-18 DIAGNOSIS — Z6830 Body mass index (BMI) 30.0-30.9, adult: Secondary | ICD-10-CM | POA: Diagnosis not present

## 2021-06-18 DIAGNOSIS — J440 Chronic obstructive pulmonary disease with acute lower respiratory infection: Secondary | ICD-10-CM | POA: Diagnosis present

## 2021-06-18 DIAGNOSIS — F32A Depression, unspecified: Secondary | ICD-10-CM | POA: Diagnosis present

## 2021-06-18 DIAGNOSIS — R0789 Other chest pain: Secondary | ICD-10-CM | POA: Diagnosis not present

## 2021-06-18 DIAGNOSIS — S29011A Strain of muscle and tendon of front wall of thorax, initial encounter: Secondary | ICD-10-CM | POA: Diagnosis present

## 2021-06-18 LAB — CBC
HCT: 40.2 % (ref 36.0–46.0)
Hemoglobin: 12.9 g/dL (ref 12.0–15.0)
MCH: 31.2 pg (ref 26.0–34.0)
MCHC: 32.1 g/dL (ref 30.0–36.0)
MCV: 97.3 fL (ref 80.0–100.0)
Platelets: 242 10*3/uL (ref 150–400)
RBC: 4.13 MIL/uL (ref 3.87–5.11)
RDW: 12.9 % (ref 11.5–15.5)
WBC: 11.2 10*3/uL — ABNORMAL HIGH (ref 4.0–10.5)
nRBC: 0 % (ref 0.0–0.2)

## 2021-06-18 LAB — BASIC METABOLIC PANEL
Anion gap: 6 (ref 5–15)
BUN: 11 mg/dL (ref 8–23)
CO2: 25 mmol/L (ref 22–32)
Calcium: 9.2 mg/dL (ref 8.9–10.3)
Chloride: 106 mmol/L (ref 98–111)
Creatinine, Ser: 0.64 mg/dL (ref 0.44–1.00)
GFR, Estimated: >60 mL/min (ref >60)
Glucose, Bld: 134 mg/dL — ABNORMAL HIGH (ref 70–99)
Potassium: 4.7 mmol/L (ref 3.5–5.1)
Sodium: 137 mmol/L (ref 135–145)

## 2021-06-18 LAB — HIV ANTIBODY (ROUTINE TESTING W REFLEX): HIV Screen 4th Generation wRfx: NONREACTIVE

## 2021-06-18 MED ORDER — IPRATROPIUM-ALBUTEROL 0.5-2.5 (3) MG/3ML IN SOLN
3.0000 mL | RESPIRATORY_TRACT | Status: DC | PRN
  Filled 2021-06-18: qty 3

## 2021-06-18 MED ORDER — IPRATROPIUM-ALBUTEROL 0.5-2.5 (3) MG/3ML IN SOLN
3.0000 mL | Freq: Four times a day (QID) | RESPIRATORY_TRACT | Status: DC
  Administered 2021-06-18 – 2021-06-19 (×2): 3 mL via RESPIRATORY_TRACT
  Filled 2021-06-18 (×2): qty 3

## 2021-06-18 MED ORDER — MOMETASONE FURO-FORMOTEROL FUM 200-5 MCG/ACT IN AERO
2.0000 | INHALATION_SPRAY | Freq: Two times a day (BID) | RESPIRATORY_TRACT | Status: DC
  Administered 2021-06-18 – 2021-06-19 (×2): 2 via RESPIRATORY_TRACT
  Filled 2021-06-18: qty 8.8

## 2021-06-18 MED ORDER — AZITHROMYCIN 250 MG PO TABS
500.0000 mg | ORAL_TABLET | Freq: Every day | ORAL | Status: AC
  Administered 2021-06-18 – 2021-06-22 (×5): 500 mg via ORAL
  Filled 2021-06-18 (×5): qty 2

## 2021-06-18 MED ORDER — GUAIFENESIN-DM 100-10 MG/5ML PO SYRP
10.0000 mL | ORAL_SOLUTION | Freq: Four times a day (QID) | ORAL | Status: DC | PRN
  Administered 2021-06-18: 10 mL via ORAL
  Filled 2021-06-18: qty 10

## 2021-06-18 MED ORDER — IPRATROPIUM-ALBUTEROL 0.5-2.5 (3) MG/3ML IN SOLN: 3.0000 mL | Freq: Three times a day (TID) | RESPIRATORY_TRACT | Status: DC

## 2021-06-18 MED ORDER — METHYLPREDNISOLONE SODIUM SUCC 40 MG IJ SOLR: 40.0000 mg | Freq: Every day | INTRAMUSCULAR | Status: DC

## 2021-06-18 MED ORDER — HYDROCOD POLST-CPM POLST ER 10-8 MG/5ML PO SUER
5.0000 mL | Freq: Two times a day (BID) | ORAL | Status: DC
Start: 2021-06-18 — End: 2021-06-19
  Administered 2021-06-18 – 2021-06-19 (×2): 5 mL via ORAL
  Filled 2021-06-18 (×2): qty 1

## 2021-06-18 NOTE — Progress Notes (Signed)
Mobility Specialist - Progress Note    06/18/21 1140  Oxygen Therapy  SpO2 97 %  O2 Device Nasal Cannula  O2 Flow Rate (L/min) 3 L/min  Patient Activity (if Appropriate) Ambulating  Mobility  Activity Ambulated in hall  Level of Assistance Independent after set-up  Assistive Device None  Distance Ambulated (ft) 300 ft  Mobility Ambulated independently in hallway  Mobility Response Tolerated well  Mobility performed by Mobility specialist (11:24-11:40)  $Mobility charge 1 Mobility    Pre-mobility: 98% SpO2 During mobility: 97% SpO2 Post-mobility: 94% SPO2  Pt stated she has never had to use O2 in the past and attempted to stand at EOB without it, but pt began to desat and O2 measured at 90% on RA. Pt was reconnected to 3L of O2 and stood for 2 minutes before ambulating. Pt's O2 levels were at 98% before ambulating and during session O2 levels stayed in upper 90s. Pt did report SOB, but stated "not enough where I need my inhaler". Pt required 1 standing rest break to practice pursed breathing and later returned to room. Pt left in bed with call bell at side, family in room, and on 3L of O2.   [facility] Specialist Acute Rehabilitation Services Phone: [phone] 06/18/21, 11:45 AM

## 2021-06-18 NOTE — Progress Notes (Addendum)
PROGRESS NOTE    Veronica Barrett  [id number] DOB: [birth year] DOA: 06/17/2021 PCP: [name], PA-C    Brief Narrative:  Mrs. Dilday was admitted to the hospital with the working diagnosis of acute asthma exacerbation.   70 year old female past medical history for seasonal allergies, anxiety and depression who presented with dyspnea, cough and wheezing.  Ongoing symptoms for about 2 weeks, her symptoms were persistent despite bronchodilators and systemic steroids as an outpatient.  Because of persistent symptoms she came to the hospital.  On her initial physical examination blood pressure 113/60, heart rate 70, respiratory rate 17, temperature 99, oxygen saturation 91%, her lungs had prolonged expiratory phase and diffuse wheezing, no rales, heart S1-S2, present, rhythmic, soft abdomen, no lower extremity edema.  Sodium 137, potassium 3.7, chloride 100, bicarb 26, glucose 102, BUN 7, creatinine 0.64, white count 16.3, hemoglobin 13.4, hematocrit 41.2, platelets 250.  SARS COVID-19 negative.  Chest radiograph no infiltrates.  EKG 68 bpm, left axis deviation, normal intervals, sinus rhythm, no significant ST segment or T wave changes.  Assessment & Plan:   Principal Problem:   Acute bronchitis Active Problems:   Environmental and seasonal allergies   Hyperlipidemia   Leukocytosis   Depression   Acute tracheobronchitis. Patient with persistent symptoms, for the last 2 weeks, at home had steroids and bronchodilators. No formal diagnosis of asthma in the past. Had COVID 19 in early this year.  Reactive leukocytosis improved with wbc down to 11.2  Plan to continue aggressive bronchodilator therapy with duoneb.  Systemic and inhaled corticosteroids. Antitussive agents and airway clearing techniques with flutter valve and incentive spirometer. Check d dimer in the setting of recent COVID 19.  Add azithromycin for airway inflammation #5 doses.  Oxymetry monitoring and supplemental 02  per [place] to keep 02 saturation more than 92% Out of bed to chair tid with meals.   2. Obesity class 1 and dyslipidemia.  Calculated BMI is 30,4.  Continue with atorvastatin.  3. Depression. Continue with citalopram.    Patient continue to be at high risk for worsening bronchitis.   Status is: Observation  The patient remains OBS appropriate and will d/c before 2 midnights.  Dispo: The patient is from: Home              Anticipated d/c is to: Home              Patient currently is not medically stable to d/c.   Difficult to place patient No    DVT prophylaxis: Enoxaparin   Code Status:   full  Family Communication:  I spoke with patient's daughter at the bedside, we talked in detail about patient's condition, plan of care and prognosis and all questions were addressed.    Subjective: Patient continue to have significant dyspnea, positive cough and increase sputum production. No nausea or vomiting, but very weak and deconditioned, not back to her baseline.   Objective: Vitals:   06/18/21 1057 06/18/21 1103 06/18/21 1140 06/18/21 1320  BP:    111/71  Pulse:    69  Resp:    20  Temp:    99.3 F (37.4 C)  TempSrc:    Oral  SpO2: 93% 93% 97% 97%    Intake/Output Summary (Last 24 hours) at 06/18/2021 1540 Last data filed at 06/18/2021 1346 Gross per 24 hour  Intake 960 ml  Output --  Net 960 ml   There were no vitals filed for this visit.  Examination:   General: Not  in pain,. Positive dyspnea at rest and with minimal exertion  Neurology: Awake and alert, non focal  E ENT: no pallor, no icterus, oral mucosa moist Cardiovascular: No JVD. S1-S2 present, rhythmic, no gallops, rubs, or murmurs. No lower extremity edema. Pulmonary: positive breath sounds bilaterally, decrease air movement, no wheezing,  bilateral rhonchi and rales. Gastrointestinal. Abdomen  soft and non tender Skin. No rashes Musculoskeletal: no joint deformities     Data Reviewed: I have personally  reviewed following labs and imaging studies  CBC: Recent Labs  Lab 06/17/21 1130 06/18/21 0402  WBC 16.3* 11.2*  HGB 13.4 12.9  HCT 41.2 40.2  MCV 96.5 97.3  PLT 250 [id number]   Basic Metabolic Panel: Recent Labs  Lab 06/17/21 1130 06/18/21 0402  NA 137 137  K 3.7 4.7  CL 100 106  CO2 26 25  GLUCOSE 102* 134*  BUN 7* 11  CREATININE 0.64 0.64  CALCIUM 9.3 9.2   GFR: Estimated Creatinine Clearance: 70.6 mL/min (by C-G formula based on SCr of 0.64 mg/dL). Liver Function Tests: No results for input(s): AST, ALT, ALKPHOS, BILITOT, PROT, ALBUMIN in the last 168 hours. No results for input(s): LIPASE, AMYLASE in the last 168 hours. No results for input(s): AMMONIA in the last 168 hours. Coagulation Profile: No results for input(s): INR, PROTIME in the last 168 hours. Cardiac Enzymes: No results for input(s): CKTOTAL, CKMB, CKMBINDEX, TROPONINI in the last 168 hours. BNP (last 3 results) No results for input(s): PROBNP in the last 8760 hours. HbA1C: No results for input(s): HGBA1C in the last 72 hours. CBG: No results for input(s): GLUCAP in the last 168 hours. Lipid Profile: No results for input(s): CHOL, HDL, LDLCALC, TRIG, CHOLHDL, LDLDIRECT in the last 72 hours. Thyroid Function Tests: No results for input(s): TSH, T4TOTAL, FREET4, T3FREE, THYROIDAB in the last 72 hours. Anemia Panel: No results for input(s): VITAMINB12, FOLATE, FERRITIN, TIBC, IRON, RETICCTPCT in the last 72 hours.    Radiology Studies: I have reviewed all of the imaging during this hospital visit personally     Scheduled Meds:  atorvastatin  20 mg Oral Daily   citalopram  30 mg Oral Daily   enoxaparin (LOVENOX) injection  40 mg Subcutaneous Q24H   ipratropium-albuterol  3 mL Nebulization TID   methylPREDNISolone (SOLU-MEDROL) injection  40 mg Intravenous Q12H   Continuous Infusions:   LOS: 0 days        Pratt Bress Gerome Apley, MD

## 2021-06-19 DIAGNOSIS — J9621 Acute and chronic respiratory failure with hypoxia: Secondary | ICD-10-CM | POA: Diagnosis not present

## 2021-06-19 DIAGNOSIS — F32A Depression, unspecified: Secondary | ICD-10-CM

## 2021-06-19 DIAGNOSIS — F419 Anxiety disorder, unspecified: Secondary | ICD-10-CM | POA: Diagnosis not present

## 2021-06-19 DIAGNOSIS — J209 Acute bronchitis, unspecified: Secondary | ICD-10-CM | POA: Diagnosis not present

## 2021-06-19 LAB — RESPIRATORY PANEL BY PCR

## 2021-06-19 LAB — DIFFERENTIAL
Abs Immature Granulocytes: 0.08 10*3/uL — ABNORMAL HIGH (ref 0.00–0.07)
Basophils Absolute: 0 10*3/uL (ref 0.0–0.1)
Basophils Relative: 0 %
Eosinophils Absolute: 0 10*3/uL (ref 0.0–0.5)
Eosinophils Relative: 0 %
Immature Granulocytes: 1 %
Lymphocytes Relative: 11 %
Lymphs Abs: 1.3 10*3/uL (ref 0.7–4.0)
Monocytes Absolute: 0.2 10*3/uL (ref 0.1–1.0)
Monocytes Relative: 2 %
Neutro Abs: 9.7 10*3/uL — ABNORMAL HIGH (ref 1.7–7.7)
Neutrophils Relative %: 86 %

## 2021-06-19 LAB — BASIC METABOLIC PANEL
Anion gap: 6 (ref 5–15)
BUN: 20 mg/dL (ref 8–23)
CO2: 25 mmol/L (ref 22–32)
Calcium: 9.1 mg/dL (ref 8.9–10.3)
Chloride: 106 mmol/L (ref 98–111)
Creatinine, Ser: 0.66 mg/dL (ref 0.44–1.00)
GFR, Estimated: >60 mL/min (ref >60)
Glucose, Bld: 104 mg/dL — ABNORMAL HIGH (ref 70–99)
Potassium: 5.1 mmol/L (ref 3.5–5.1)
Sodium: 137 mmol/L (ref 135–145)

## 2021-06-19 LAB — PROCALCITONIN: Procalcitonin: <0.1 ng/mL

## 2021-06-19 MED ORDER — METHYLPREDNISOLONE SODIUM SUCC 125 MG IJ SOLR
90.0000 mg | Freq: Two times a day (BID) | INTRAMUSCULAR | Status: DC
  Administered 2021-06-19 – 2021-06-20 (×2): 90 mg via INTRAVENOUS
  Filled 2021-06-19 (×2): qty 2

## 2021-06-19 MED ORDER — METHYLPREDNISOLONE SODIUM SUCC 125 MG IJ SOLR
60.0000 mg | Freq: Two times a day (BID) | INTRAMUSCULAR | Status: DC
  Administered 2021-06-19: 60 mg via INTRAVENOUS
  Filled 2021-06-19: qty 2

## 2021-06-19 MED ORDER — GUAIFENESIN ER 600 MG PO TB12
600.0000 mg | ORAL_TABLET | Freq: Two times a day (BID) | ORAL | Status: DC
  Administered 2021-06-19 – 2021-06-22 (×7): 600 mg via ORAL
  Filled 2021-06-19 (×7): qty 1

## 2021-06-19 MED ORDER — BUDESONIDE 0.25 MG/2ML IN SUSP
0.2500 mg | Freq: Two times a day (BID) | RESPIRATORY_TRACT | Status: DC
  Administered 2021-06-19 – 2021-06-21 (×5): 0.25 mg via RESPIRATORY_TRACT
  Filled 2021-06-19 (×5): qty 2

## 2021-06-19 MED ORDER — PANTOPRAZOLE SODIUM 40 MG PO TBEC
40.0000 mg | DELAYED_RELEASE_TABLET | Freq: Every day | ORAL | Status: DC
  Administered 2021-06-19 – 2021-06-22 (×4): 40 mg via ORAL
  Filled 2021-06-19 (×4): qty 1

## 2021-06-19 MED ORDER — ARFORMOTEROL TARTRATE 15 MCG/2ML IN NEBU
15.0000 ug | INHALATION_SOLUTION | Freq: Two times a day (BID) | RESPIRATORY_TRACT | Status: DC
  Administered 2021-06-19 – 2021-06-21 (×5): 15 ug via RESPIRATORY_TRACT
  Filled 2021-06-19 (×7): qty 2

## 2021-06-19 NOTE — Progress Notes (Signed)
SATURATION QUALIFICATIONS: (This note is used to comply with regulatory documentation for home oxygen)  Patient Saturations on Room Air at Rest = 82%  Patient Saturations on Room Air while Ambulating = 77%  Patient Saturations on 2 Liters of oxygen while resting= 89%  Please briefly explain why patient needs home oxygen:pt saturations fall below 88 when at rest on room air.

## 2021-06-19 NOTE — Plan of Care (Signed)

## 2021-06-19 NOTE — Progress Notes (Signed)
PROGRESS NOTE  RIAHNA PLASS [id number] DOB: 1951/08/04   PCP: [name], PA-C  Patient is from: Home.  DOA: 06/17/2021 LOS: 1  Chief complaints:  Chief Complaint  Patient presents with   Shortness of Breath     Brief Narrative / Interim history: 70 year old F with PMH of reactive airway disease, seasonal allergies, anxiety and depression presenting with persistent dyspnea, cough and wheezing for 2 weeks despite course of steroid and bronchodilators outpatient, and admitted for acute tracheobronchitis.  CXR without acute finding.  COVID-19 and influenza PCR nonreactive.  Procalcitonin negative.  Started on systemic steroid and breathing treatments.  Azithromycin added on 8/2.   Subjective: Seen and examined earlier this morning.  Still with significant shortness of breath and cough.  Reports significant cough after breathing treatment.  Also pain over LUQ with cough.  Cough is productive with whitish to yellowish phlegm.  She denies hemoptysis.  Denies chest pain with breathing.  Denies runny nose or sore throat.  Objective: Vitals:   06/18/21 1930 06/18/21 2130 06/19/21 0520 06/19/21 0729  BP:  107/60 130/67   Pulse:  68 (!) 50   Resp:  18 17   Temp:  98.9 F (37.2 C) (!) 97.4 F (36.3 C)   TempSrc:  Oral Oral   SpO2: 92% 94% 98% 94%    Intake/Output Summary (Last 24 hours) at 06/19/2021 1201 Last data filed at 06/19/2021 1000 Gross per 24 hour  Intake 840 ml  Output --  Net 840 ml   There were no vitals filed for this visit.  Examination:  GENERAL: No apparent distress.  Nontoxic. HEENT: MMM.  Vision and hearing grossly intact.  NECK: Supple.  No apparent JVD.  RESP: 89% on 2 L at rest.  Coughing during exam.  Rhonchi bilaterally. CVS:  RRR. Heart sounds normal.  ABD/GI/GU: BS+. Abd soft, NTND.  MSK/EXT:  Moves extremities. No apparent deformity. No edema.  SKIN: no apparent skin lesion or wound NEURO: Awake, alert and oriented appropriately.  No apparent  focal neuro deficit. PSYCH: Calm. Normal affect.   Procedures:  None  Microbiology summarized: [id number] and influenza PCR nonreactive. RVP pending.  Assessment & Plan: Acute respiratory failure with hypoxia due to acute bronchitis-suspect viral etiology.  COVID-19 and influenza PCR nonreactive.  No formal diagnosis of asthma but treated clinically with steroid and inhaler about 10 years ago.  Still with significant cough, shortness of breath and hypoxia.  She desaturated to 77% with ambulation on room air.  Initial CXR without acute finding.  Procalcitonin negative.  No signs of CHF.  Clinical picture not consistent with VTE. -Increase IV Solu-Medrol to 60 mg 3 times daily -Continue azithromycin -Start Brovana and Pulmicort nebulizers -Continue as needed DuoNebs -Mucolytic's/antitussive/incentive spirometry/flutter valve/OOB/PT/OT -Check full RVP panel.  Add differential to CBC to check for eosinophilia -If no improvement over the next 24 hours, will pursue CT chest with contrast +/- PCCM -Needs outpatient PFT/spirometry  Leukocytosis-could be from steroid or infection.  Improved. -Check differential  Anxiety and depression -Continue home citalopram         DVT prophylaxis:  enoxaparin (LOVENOX) injection 40 mg Start: 06/17/21 2200  Code Status: Full code Family Communication: Updated patient's daughter at bedside. Level of care: Med-Surg Status is: Inpatient  Remains inpatient appropriate because:IV treatments appropriate due to intensity of illness or inability to take PO and Inpatient level of care appropriate due to severity of illness  Dispo: The patient is from: Home  Anticipated d/c is to: Home              Patient currently is not medically stable to d/c.   Difficult to place patient No       Consultants:  None   Sch Meds:  Scheduled Meds:  arformoterol  15 mcg Nebulization BID   atorvastatin  20 mg Oral Daily   azithromycin  500 mg Oral  Daily   budesonide (PULMICORT) nebulizer solution  0.25 mg Nebulization BID   citalopram  30 mg Oral Daily   enoxaparin (LOVENOX) injection  40 mg Subcutaneous Q24H   guaiFENesin  600 mg Oral BID   methylPREDNISolone (SOLU-MEDROL) injection  90 mg Intravenous Q12H   pantoprazole  40 mg Oral Daily   Continuous Infusions: PRN Meds:.acetaminophen **OR** acetaminophen, guaiFENesin-dextromethorphan, HYDROcodone-acetaminophen, ipratropium-albuterol, loratadine, melatonin, ondansetron **OR** ondansetron (ZOFRAN) IV, senna-docusate  Antimicrobials: Anti-infectives (From admission, onward)    Start     Dose/Rate Route Frequency Ordered Stop   06/18/21 1700  azithromycin (ZITHROMAX) tablet 500 mg        500 mg Oral Daily 06/18/21 1610 06/23/21 0959        I have personally reviewed the following labs and images: CBC: Recent Labs  Lab 06/17/21 1130 06/18/21 0402  WBC 16.3* 11.2*  HGB 13.4 12.9  HCT 41.2 40.2  MCV 96.5 97.3  PLT 250 242   BMP &GFR Recent Labs  Lab 06/17/21 1130 06/18/21 0402 06/19/21 0408  NA 137 137 137  K 3.7 4.7 5.1  CL 100 106 106  CO2 '26 25 25  '$ GLUCOSE 102* 134* 104*  BUN 7* 11 20  CREATININE 0.64 0.64 0.66  CALCIUM 9.3 9.2 9.1   Estimated Creatinine Clearance: 70.6 mL/min (by C-G formula based on SCr of 0.66 mg/dL). Liver & Pancreas: No results for input(s): AST, ALT, ALKPHOS, BILITOT, PROT, ALBUMIN in the last 168 hours. No results for input(s): LIPASE, AMYLASE in the last 168 hours. No results for input(s): AMMONIA in the last 168 hours. Diabetic: No results for input(s): HGBA1C in the last 72 hours. No results for input(s): GLUCAP in the last 168 hours. Cardiac Enzymes: No results for input(s): CKTOTAL, CKMB, CKMBINDEX, TROPONINI in the last 168 hours. No results for input(s): PROBNP in the last 8760 hours. Coagulation Profile: No results for input(s): INR, PROTIME in the last 168 hours. Thyroid Function Tests: No results for input(s): TSH,  T4TOTAL, FREET4, T3FREE, THYROIDAB in the last 72 hours. Lipid Profile: No results for input(s): CHOL, HDL, LDLCALC, TRIG, CHOLHDL, LDLDIRECT in the last 72 hours. Anemia Panel: No results for input(s): VITAMINB12, FOLATE, FERRITIN, TIBC, IRON, RETICCTPCT in the last 72 hours. Urine analysis:    Component Value Date/Time   COLORURINE YELLOW 10/10/2014 1100   APPEARANCEUR CLEAR 10/10/2014 1100   LABSPEC 1.015 10/10/2014 1100   PHURINE 7.0 10/10/2014 1100   GLUCOSEU NEGATIVE 10/10/2014 1100   HGBUR NEGATIVE 10/10/2014 1100   BILIRUBINUR n 08/24/2018 1510   KETONESUR NEGATIVE 10/10/2014 1100   PROTEINUR Negative 08/24/2018 1510   PROTEINUR NEGATIVE 10/10/2014 1100   UROBILINOGEN 0.2 08/24/2018 1510   UROBILINOGEN 0.2 10/10/2014 1100   NITRITE n 08/24/2018 1510   NITRITE NEGATIVE 10/10/2014 1100   LEUKOCYTESUR Negative 08/24/2018 1510   Sepsis Labs: Invalid input(s): PROCALCITONIN, [place]  Microbiology: Recent Results (from the past 240 hour(s))  Resp Panel by RT-PCR (Flu A&B, Covid) Nasopharyngeal Swab     Status: None   Collection Time: 06/17/21 12:20 PM   Specimen: Nasopharyngeal Swab; Nasopharyngeal(NP)  swabs in vial transport medium  Result Value Ref Range Status   SARS Coronavirus 2 by RT PCR NEGATIVE NEGATIVE Final    Comment: (NOTE) SARS-CoV-2 target nucleic acids are NOT DETECTED.  The SARS-CoV-2 RNA is generally detectable in upper respiratory specimens during the acute phase of infection. The lowest concentration of SARS-CoV-2 viral copies this assay can detect is 138 copies/mL. A negative result does not preclude SARS-Cov-2 infection and should not be used as the sole basis for treatment or other patient management decisions. A negative result may occur with  improper specimen collection/handling, submission of specimen other than nasopharyngeal swab, presence of viral mutation(s) within the areas targeted by this assay, and inadequate number of  viral copies(<138 copies/mL). A negative result must be combined with clinical observations, patient history, and epidemiological information. The expected result is Negative.  Fact Sheet for Patients:  EntrepreneurPulse.com.au  Fact Sheet for Healthcare Providers:  IncredibleEmployment.be  This test is no t yet approved or cleared by the [place] FDA and  has been authorized for detection and/or diagnosis of SARS-CoV-2 by FDA under an Emergency Use Authorization (EUA). This EUA will remain  in effect (meaning this test can be used) for the duration of the COVID-19 declaration under Section 564(b)(1) of the Act, 21 U.S.C.section 360bbb-3(b)(1), unless the authorization is terminated  or revoked sooner.       Influenza A by PCR NEGATIVE NEGATIVE Final   Influenza B by PCR NEGATIVE NEGATIVE Final    Comment: (NOTE) The Xpert Xpress SARS-CoV-2/FLU/RSV plus assay is intended as an aid in the diagnosis of influenza from Nasopharyngeal swab specimens and should not be used as a sole basis for treatment. Nasal washings and aspirates are unacceptable for Xpert Xpress SARS-CoV-2/FLU/RSV testing.  Fact Sheet for Patients: EntrepreneurPulse.com.au  Fact Sheet for Healthcare Providers: IncredibleEmployment.be  This test is not yet approved or cleared by the [place] FDA and has been authorized for detection and/or diagnosis of SARS-CoV-2 by FDA under an Emergency Use Authorization (EUA). This EUA will remain in effect (meaning this test can be used) for the duration of the COVID-19 declaration under Section 564(b)(1) of the Act, 21 U.S.C. section 360bbb-3(b)(1), unless the authorization is terminated or revoked.  Performed at [facility], [address], [place], [place] [address]     Radiology Studies: No results found.    Jamelle Noy T. [place]  If 7PM-7AM,  please contact night-coverage [email or web address] 06/19/2021, 12:01 PM

## 2021-06-20 DIAGNOSIS — J209 Acute bronchitis, unspecified: Secondary | ICD-10-CM | POA: Diagnosis not present

## 2021-06-20 DIAGNOSIS — F32A Depression, unspecified: Secondary | ICD-10-CM | POA: Diagnosis not present

## 2021-06-20 DIAGNOSIS — J9621 Acute and chronic respiratory failure with hypoxia: Secondary | ICD-10-CM | POA: Diagnosis not present

## 2021-06-20 DIAGNOSIS — F419 Anxiety disorder, unspecified: Secondary | ICD-10-CM | POA: Diagnosis not present

## 2021-06-20 LAB — CBC WITH DIFFERENTIAL/PLATELET
Abs Immature Granulocytes: 0.08 10*3/uL — ABNORMAL HIGH (ref 0.00–0.07)
Basophils Absolute: 0 10*3/uL (ref 0.0–0.1)
Basophils Relative: 0 %
Eosinophils Absolute: 0 10*3/uL (ref 0.0–0.5)
Eosinophils Relative: 0 %
HCT: 39.6 % (ref 36.0–46.0)
Hemoglobin: 12.7 g/dL (ref 12.0–15.0)
Immature Granulocytes: 1 %
Lymphocytes Relative: 14 %
Lymphs Abs: 1.2 10*3/uL (ref 0.7–4.0)
MCH: 30.7 pg (ref 26.0–34.0)
MCHC: 32.1 g/dL (ref 30.0–36.0)
MCV: 95.7 fL (ref 80.0–100.0)
Monocytes Absolute: 0.4 10*3/uL (ref 0.1–1.0)
Monocytes Relative: 5 %
Neutro Abs: 6.9 10*3/uL (ref 1.7–7.7)
Neutrophils Relative %: 80 %
Platelets: 236 10*3/uL (ref 150–400)
RBC: 4.14 MIL/uL (ref 3.87–5.11)
RDW: 12.7 % (ref 11.5–15.5)
WBC: 8.5 10*3/uL (ref 4.0–10.5)
nRBC: 0 % (ref 0.0–0.2)

## 2021-06-20 LAB — PROCALCITONIN: Procalcitonin: <0.1 ng/mL

## 2021-06-20 LAB — BRAIN NATRIURETIC PEPTIDE: B Natriuretic Peptide: 37.4 pg/mL (ref 0.0–100.0)

## 2021-06-20 MED ORDER — METHYLPREDNISOLONE SODIUM SUCC 125 MG IJ SOLR
120.0000 mg | INTRAMUSCULAR | Status: DC
  Administered 2021-06-21: 120 mg via INTRAVENOUS
  Filled 2021-06-20: qty 2

## 2021-06-20 NOTE — Evaluation (Signed)
Physical Therapy Evaluation Patient Details Name: Veronica Barrett MRN: [id number] DOB: [birth year] Today's Date: 06/20/2021   History of Present Illness  Patient is a 70 year old female presenting with persistent dyspnea, cough and wheezing for 2 weeks despite course of steroid and bronchodilators outpatient, and admitted for acute tracheobronchitis. PMH includes  reactive airway disease, seasonal allergies, anxiety and depression  Clinical Impression  Pt admitted as above and demonstrating IND in basic mobility tasks.  Mild instability with initial gait but improved with increased distance and no LOB noted.  Pt with no ongoing PT needs.  Will dc PT services and turned pt over to mobility team for remainder of hospital stay.    Follow Up Recommendations No PT follow up    Equipment Recommendations  None recommended by PT    Recommendations for Other Services       Precautions / Restrictions Precautions Precaution Comments: monitor O2 Restrictions Weight Bearing Restrictions: No      Mobility  Bed Mobility Overal bed mobility: Modified Independent             General bed mobility comments: No physical assist    Transfers Overall transfer level: Independent Equipment used: None                Ambulation/Gait Ambulation/Gait assistance: Supervision;Independent Gait Distance (Feet): 450 Feet Assistive device: None Gait Pattern/deviations: Step-through pattern;WFL(Within Functional Limits) Gait velocity: mod pace   General Gait Details: Mild instability initially but no over LOB.  Stability improving with increased distance walked.  "I just need to get my land legs back"  Stairs            Wheelchair Mobility    Modified Rankin (Stroke Patients Only)       Balance Overall balance assessment: No apparent balance deficits (not formally assessed)                                           Pertinent Vitals/Pain Pain Assessment: No/denies  pain    Home Living Family/patient expects to be discharged to:: Private residence Living Arrangements: Spouse/significant other Available Help at Discharge: Family;Available 24 hours/day Type of Home: House Home Access: Stairs to enter Entrance Stairs-Rails: None Entrance Stairs-Number of Steps: 3-4 Home Layout: Two level;Able to live on main level with bedroom/bathroom Home Equipment: Gilford Rile - 2 wheels;Grab bars - tub/shower      Prior Function Level of Independence: Independent               Hand Dominance   Dominant Hand: Right    Extremity/Trunk Assessment   Upper Extremity Assessment Upper Extremity Assessment: Overall WFL for tasks assessed    Lower Extremity Assessment Lower Extremity Assessment: Overall WFL for tasks assessed    Cervical / Trunk Assessment Cervical / Trunk Assessment: Normal  Communication   Communication: HOH  Cognition Arousal/Alertness: Awake/alert Behavior During Therapy: WFL for tasks assessed/performed Overall Cognitive Status: Within Functional Limits for tasks assessed                                        General Comments      Exercises     Assessment/Plan    PT Assessment Patient needs continued PT services  PT Problem List         PT  Treatment Interventions Gait training;Functional mobility training    PT Goals (Current goals can be found in the Care Plan section)  Acute Rehab PT Goals Patient Stated Goal: home soon PT Goal Formulation: All assessment and education complete, DC therapy    Frequency Min 1X/week   Barriers to discharge        Co-evaluation               AM-PAC PT "6 Clicks" Mobility  Outcome Measure Help needed turning from your back to your side while in a flat bed without using bedrails?: None Help needed moving from lying on your back to sitting on the side of a flat bed without using bedrails?: None Help needed moving to and from a bed to a chair (including a  wheelchair)?: None Help needed standing up from a chair using your arms (e.g., wheelchair or bedside chair)?: None Help needed to walk in hospital room?: None Help needed climbing 3-5 steps with a railing? : A Little 6 Click Score: 23    End of Session Equipment Utilized During Treatment: Gait belt;Oxygen Activity Tolerance: Patient tolerated treatment well Patient left: in bed;with call bell/phone within reach;with bed alarm set Nurse Communication: Mobility status PT Visit Diagnosis: Difficulty in walking, not elsewhere classified (R26.2)    Time: [id number] PT Time Calculation (min) (ACUTE ONLY): 17 min   Charges:   PT Evaluation $PT Eval Low Complexity: [place] PT Acute Rehabilitation Services Pager [phone] Office [phone]   Jasmane Brockway 06/20/2021, 4:43 PM

## 2021-06-20 NOTE — Evaluation (Signed)
Occupational Therapy Evaluation Patient Details Name: Veronica Barrett MRN: [id number] DOB: Nov 12, 1951 Today's Date: 06/20/2021    History of Present Illness Patient is a 70 year old female presenting with persistent dyspnea, cough and wheezing for 2 weeks despite course of steroid and bronchodilators outpatient, and admitted for acute tracheobronchitis. PMH includes  reactive airway disease, seasonal allergies, anxiety and depression   Clinical Impression   Patient lives at home with spouse, is independent at baseline without AD use. Can stay on main level of home if needed. Patient demonstrate independence with BADL tasks during evaluation, no loss of balance or physical assistance needed. Patient did desat on room air to 87%, with pursed lip breathing unable to maintain above 90% therefore donned 1L while in recliner and able to maintain 92-93%. Educate patient on use of incentive spirometer and keeping active while in hospital, patient verbalize understanding and states she was up to chair all day yesterday and has been ambulating to the bathroom. No further acute OT needs, will sign off. Please re-consult if new needs arise.    Follow Up Recommendations  No OT follow up    Equipment Recommendations  None recommended by OT       Precautions / Restrictions Precautions Precaution Comments: monitor O2 Restrictions Weight Bearing Restrictions: No      Mobility Bed Mobility Overal bed mobility: Modified Independent                  Transfers Overall transfer level: Independent Equipment used: None                  Balance Overall balance assessment: No apparent balance deficits (not formally assessed)                                         ADL either performed or assessed with clinical judgement   ADL Overall ADL's : Independent                                       General ADL Comments: patient able to ambulate to/from bathroom,  complete functional transfers and perform LB dressing task without any physical assistance and no AD use. Educate patient on pursed lip breathing strategies as well as use of incentive spirometer to improve O2 saturations as she does not wear O2 at home. Patient desat to 87% on room air, unable to maintain above 90% on room air therefore donned 1L and able to maintain 92-93% with RN notified      Pertinent Vitals/Pain Pain Assessment: Faces Faces Pain Scale: Hurts a little bit Pain Location: chest from coughing Pain Descriptors / Indicators: Sore Pain Intervention(s): Monitored during session     Hand Dominance Right   Extremity/Trunk Assessment Upper Extremity Assessment Upper Extremity Assessment: Overall WFL for tasks assessed   Lower Extremity Assessment Lower Extremity Assessment: Defer to PT evaluation   Cervical / Trunk Assessment Cervical / Trunk Assessment: Normal   Communication Communication Communication: HOH   Cognition Arousal/Alertness: Awake/alert Behavior During Therapy: WFL for tasks assessed/performed Overall Cognitive Status: Within Functional Limits for tasks assessed  Home Living Family/patient expects to be discharged to:: Private residence Living Arrangements: Spouse/significant other Available Help at Discharge: Family;Available 24 hours/day Type of Home: House Home Access: Stairs to enter [employer] of Steps: 3-4   Home Layout: Two level;Able to live on main level with bedroom/bathroom     Bathroom Shower/Tub: Tub/shower unit;Walk-in shower (walk in is upstairs)   Biochemist, clinical: Standard     Home Equipment: Environmental consultant - 2 wheels;Grab bars - tub/shower (could get shower chair- at [employer])          Prior Functioning/Environment Level of Independence: Independent                 OT Problem List: Decreased activity tolerance;Cardiopulmonary status limiting  activity         OT Goals(Current goals can be found in the care plan section) Acute Rehab OT Goals Patient Stated Goal: home soon OT Goal Formulation: All assessment and education complete, DC therapy   AM-PAC OT "6 Clicks" Daily Activity     Outcome Measure Help from another person eating meals?: None Help from another person taking care of personal grooming?: None Help from another person toileting, which includes using toliet, bedpan, or urinal?: None Help from another person bathing (including washing, rinsing, drying)?: None Help from another person to put on and taking off regular upper body clothing?: None Help from another person to put on and taking off regular lower body clothing?: None 6 Click Score: 24   End of Session Equipment Utilized During Treatment: Oxygen Nurse Communication: Mobility status;Other (comment) (O2 sats)  Activity Tolerance: Patient tolerated treatment well Patient left: in chair;with call bell/phone within reach  OT Visit Diagnosis: Other abnormalities of gait and mobility (R26.89)                Time: [id number] OT Time Calculation (min): 19 min Charges:  OT General Charges $OT Visit: 1 Visit OT Evaluation $OT Eval Low Complexity: [place] OT OT pager: [place] 06/20/2021, 12:47 PM

## 2021-06-20 NOTE — Progress Notes (Signed)
PROGRESS NOTE  Veronica Barrett [id number] DOB: 25-Apr-1951   PCP: [name], PA-C  Patient is from: Home.  DOA: 06/17/2021 LOS: 2  Chief complaints:  Chief Complaint  Patient presents with   Shortness of Breath     Brief Narrative / Interim history: 70 year old F with PMH of reactive airway disease, seasonal allergies, anxiety and depression presenting with persistent dyspnea, cough and wheezing for 2 weeks despite course of steroid and bronchodilators outpatient, and admitted for acute tracheobronchitis.  CXR without acute finding.  COVID-19 and influenza PCR nonreactive.  Procalcitonin negative.  Started on systemic steroid and breathing treatments.  Azithromycin added on 8/2.  Patient continued to be hypoxic with respiratory distress.  Increased systemic steroid and added nebulizers.  Seems to be improving now.   Subjective: Seen and examined earlier this afternoon.  No major events overnight of this morning.  She reports significant improvement in her breathing, cough and wheeze.  She continues to require supplemental oxygen to maintain appropriate saturation.  She desaturated to 87% on RA sitting her recliner but recovered to 92 to 93% just on 1 L.  Denies GI or UTI symptoms.  Objective: Vitals:   06/20/21 0607 06/20/21 0659 06/20/21 0724 06/20/21 1309  BP:  122/71  112/68  Pulse:  (!) 51  67  Resp:  18  18  Temp:  98.9 F (37.2 C)  (!) 97.5 F (36.4 C)  TempSrc:  Oral  Oral  SpO2:  95% 93% 94%  Weight: 78.5 kg       Intake/Output Summary (Last 24 hours) at 06/20/2021 1612 Last data filed at 06/20/2021 1400 Gross per 24 hour  Intake 1560 ml  Output --  Net 1560 ml   Filed Weights   06/20/21 0607  Weight: 78.5 kg    Examination:   GENERAL: No apparent distress.  Nontoxic. HEENT: MMM.  Vision and hearing grossly intact.  NECK: Supple.  No apparent JVD.  RESP: 93% on 1 L.  No IWOB.  Fair aeration bilaterally.  No rhonchi or wheeze. CVS:  RRR. Heart sounds  normal.  ABD/GI/GU: BS+. Abd soft, NTND.  MSK/EXT:  Moves extremities. No apparent deformity. No edema.  SKIN: no apparent skin lesion or wound NEURO: Awake and alert. Oriented appropriately.  No apparent focal neuro deficit. PSYCH: Calm. Normal affect.   Procedures:  None  Microbiology summarized: [id number] and influenza PCR nonreactive. RVP pending.  Assessment & Plan: Acute respiratory failure with hypoxia due to acute bronchitis-suspect viral etiology.  COVID-19, influenza, full RVP panel negative.  Procalcitonin negative as well.  No formal diagnosis of asthma but treated clinically with steroid and inhaler about 10 years ago.  No eosinophilia on differential.  Clinical picture not consistent with VTE.  Hypoxia and respiratory distress improved after increasing Solu-Medrol and adjusting nebulizers.  Now requiring 1 L to maintain saturation in low 90s at rest. -Decrease IV Solu-Medrol from 60 mg 3 times daily to twice daily -Continue azithromycin, Brovana and Pulmicort nebulizers -Continue as needed DuoNebs -Mucolytic's/antitussive/incentive spirometry/flutter valve/OOB/PT/OT -Needs outpatient PFT/spirometry  Leukocytosis/bandemia-likely demargination from steroid.  Resolved.  Anxiety and depression -Continue home citalopram         DVT prophylaxis:  enoxaparin (LOVENOX) injection 40 mg Start: 06/17/21 2200  Code Status: Full code Family Communication: Updated patient's daughter over the phone. Level of care: Med-Surg Status is: Inpatient  Remains inpatient appropriate because:IV treatments appropriate due to intensity of illness or inability to take PO and Inpatient level of care appropriate due to  severity of illness  Dispo: The patient is from: Home              Anticipated d/c is to: Home              Patient currently is not medically stable to d/c.   Difficult to place patient No       Consultants:  None   Sch Meds:  Scheduled Meds:  arformoterol  15  mcg Nebulization BID   atorvastatin  20 mg Oral Daily   azithromycin  500 mg Oral Daily   budesonide (PULMICORT) nebulizer solution  0.25 mg Nebulization BID   citalopram  30 mg Oral Daily   enoxaparin (LOVENOX) injection  40 mg Subcutaneous Q24H   guaiFENesin  600 mg Oral BID   [START ON 06/21/2021] methylPREDNISolone (SOLU-MEDROL) injection  120 mg Intravenous Q24H   pantoprazole  40 mg Oral Daily   Continuous Infusions: PRN Meds:.acetaminophen **OR** acetaminophen, guaiFENesin-dextromethorphan, HYDROcodone-acetaminophen, ipratropium-albuterol, loratadine, melatonin, ondansetron **OR** ondansetron (ZOFRAN) IV, senna-docusate  Antimicrobials: Anti-infectives (From admission, onward)    Start     Dose/Rate Route Frequency Ordered Stop   06/18/21 1700  azithromycin (ZITHROMAX) tablet 500 mg        500 mg Oral Daily 06/18/21 1610 06/23/21 0959        I have personally reviewed the following labs and images: CBC: Recent Labs  Lab 06/17/21 1130 06/18/21 0402 06/20/21 0430  WBC 16.3* 11.2* 8.5  NEUTROABS  --  9.7* 6.9  HGB 13.4 12.9 12.7  HCT 41.2 40.2 39.6  MCV 96.5 97.3 95.7  PLT 250 242 236   BMP &GFR Recent Labs  Lab 06/17/21 1130 06/18/21 0402 06/19/21 0408  NA 137 137 137  K 3.7 4.7 5.1  CL 100 106 106  CO2 '26 25 25  '$ GLUCOSE 102* 134* 104*  BUN 7* 11 20  CREATININE 0.64 0.64 0.66  CALCIUM 9.3 9.2 9.1   Estimated Creatinine Clearance: 68.7 mL/min (by C-G formula based on SCr of 0.66 mg/dL). Liver & Pancreas: No results for input(s): AST, ALT, ALKPHOS, BILITOT, PROT, ALBUMIN in the last 168 hours. No results for input(s): LIPASE, AMYLASE in the last 168 hours. No results for input(s): AMMONIA in the last 168 hours. Diabetic: No results for input(s): HGBA1C in the last 72 hours. No results for input(s): GLUCAP in the last 168 hours. Cardiac Enzymes: No results for input(s): CKTOTAL, CKMB, CKMBINDEX, TROPONINI in the last 168 hours. No results for input(s):  PROBNP in the last 8760 hours. Coagulation Profile: No results for input(s): INR, PROTIME in the last 168 hours. Thyroid Function Tests: No results for input(s): TSH, T4TOTAL, FREET4, T3FREE, THYROIDAB in the last 72 hours. Lipid Profile: No results for input(s): CHOL, HDL, LDLCALC, TRIG, CHOLHDL, LDLDIRECT in the last 72 hours. Anemia Panel: No results for input(s): VITAMINB12, FOLATE, FERRITIN, TIBC, IRON, RETICCTPCT in the last 72 hours. Urine analysis:    Component Value Date/Time   COLORURINE YELLOW 10/10/2014 1100   APPEARANCEUR CLEAR 10/10/2014 1100   LABSPEC 1.015 10/10/2014 1100   PHURINE 7.0 10/10/2014 1100   GLUCOSEU NEGATIVE 10/10/2014 1100   HGBUR NEGATIVE 10/10/2014 1100   BILIRUBINUR n 08/24/2018 1510   KETONESUR NEGATIVE 10/10/2014 1100   PROTEINUR Negative 08/24/2018 1510   PROTEINUR NEGATIVE 10/10/2014 1100   UROBILINOGEN 0.2 08/24/2018 1510   UROBILINOGEN 0.2 10/10/2014 1100   NITRITE n 08/24/2018 1510   NITRITE NEGATIVE 10/10/2014 1100   LEUKOCYTESUR Negative 08/24/2018 1510   Sepsis Labs: Invalid input(s):  PROCALCITONIN, LACTICIDVEN  Microbiology: Recent Results (from the past 240 hour(s))  Resp Panel by RT-PCR (Flu A&B, Covid) Nasopharyngeal Swab     Status: None   Collection Time: 06/17/21 12:20 PM   Specimen: Nasopharyngeal Swab; Nasopharyngeal(NP) swabs in vial transport medium  Result Value Ref Range Status   SARS Coronavirus 2 by RT PCR NEGATIVE NEGATIVE Final    Comment: (NOTE) SARS-CoV-2 target nucleic acids are NOT DETECTED.  The SARS-CoV-2 RNA is generally detectable in upper respiratory specimens during the acute phase of infection. The lowest concentration of SARS-CoV-2 viral copies this assay can detect is 138 copies/mL. A negative result does not preclude SARS-Cov-2 infection and should not be used as the sole basis for treatment or other patient management decisions. A negative result may occur with  improper specimen  collection/handling, submission of specimen other than nasopharyngeal swab, presence of viral mutation(s) within the areas targeted by this assay, and inadequate number of viral copies(<138 copies/mL). A negative result must be combined with clinical observations, patient history, and epidemiological information. The expected result is Negative.  Fact Sheet for Patients:  EntrepreneurPulse.com.au  Fact Sheet for Healthcare Providers:  IncredibleEmployment.be  This test is no t yet approved or cleared by the [place] FDA and  has been authorized for detection and/or diagnosis of SARS-CoV-2 by FDA under an Emergency Use Authorization (EUA). This EUA will remain  in effect (meaning this test can be used) for the duration of the COVID-19 declaration under Section 564(b)(1) of the Act, 21 U.S.C.section 360bbb-3(b)(1), unless the authorization is terminated  or revoked sooner.       Influenza A by PCR NEGATIVE NEGATIVE Final   Influenza B by PCR NEGATIVE NEGATIVE Final    Comment: (NOTE) The Xpert Xpress SARS-CoV-2/FLU/RSV plus assay is intended as an aid in the diagnosis of influenza from Nasopharyngeal swab specimens and should not be used as a sole basis for treatment. Nasal washings and aspirates are unacceptable for Xpert Xpress SARS-CoV-2/FLU/RSV testing.  Fact Sheet for Patients: EntrepreneurPulse.com.au  Fact Sheet for Healthcare Providers: IncredibleEmployment.be  This test is not yet approved or cleared by the [place] FDA and has been authorized for detection and/or diagnosis of SARS-CoV-2 by FDA under an Emergency Use Authorization (EUA). This EUA will remain in effect (meaning this test can be used) for the duration of the COVID-19 declaration under Section 564(b)(1) of the Act, 21 U.S.C. section 360bbb-3(b)(1), unless the authorization is terminated or revoked.  Performed at [facility], [address] [place],  [address]   Respiratory (~20 pathogens) panel by PCR     Status: None   Collection Time: 06/19/21 10:54 AM   Specimen: Nasopharyngeal Swab; Respiratory  Result Value Ref Range Status   Adenovirus NOT DETECTED NOT DETECTED Final   Coronavirus 229E NOT DETECTED NOT DETECTED Final    Comment: (NOTE) The Coronavirus on the Respiratory Panel, DOES NOT test for the novel  Coronavirus (2019 nCoV)    Coronavirus HKU1 NOT DETECTED NOT DETECTED Final   Coronavirus NL63 NOT DETECTED NOT DETECTED Final   Coronavirus OC43 NOT DETECTED NOT DETECTED Final   Metapneumovirus NOT DETECTED NOT DETECTED Final   Rhinovirus / Enterovirus NOT DETECTED NOT DETECTED Final   Influenza A NOT DETECTED NOT DETECTED Final   Influenza B NOT DETECTED NOT DETECTED Final   Parainfluenza Virus 1 NOT DETECTED NOT DETECTED Final   Parainfluenza Virus 2 NOT DETECTED NOT DETECTED Final   Parainfluenza Virus 3 NOT DETECTED NOT DETECTED Final  Parainfluenza Virus 4 NOT DETECTED NOT DETECTED Final   Respiratory Syncytial Virus NOT DETECTED NOT DETECTED Final   Bordetella pertussis NOT DETECTED NOT DETECTED Final   Bordetella Parapertussis NOT DETECTED NOT DETECTED Final   Chlamydophila pneumoniae NOT DETECTED NOT DETECTED Final   Mycoplasma pneumoniae NOT DETECTED NOT DETECTED Final    Comment: Performed at [facility], [place] [address] [place],  [address]    Radiology Studies: No results found.    Rainee Sweatt T. [place]  If 7PM-7AM, please contact night-coverage [email or web address] 06/20/2021, 4:12 PM

## 2021-06-21 ENCOUNTER — Inpatient Hospital Stay (HOSPITAL_COMMUNITY): Payer: Medicare Other

## 2021-06-21 DIAGNOSIS — F419 Anxiety disorder, unspecified: Secondary | ICD-10-CM | POA: Diagnosis not present

## 2021-06-21 DIAGNOSIS — F32A Depression, unspecified: Secondary | ICD-10-CM | POA: Diagnosis not present

## 2021-06-21 DIAGNOSIS — J9621 Acute and chronic respiratory failure with hypoxia: Secondary | ICD-10-CM | POA: Diagnosis not present

## 2021-06-21 DIAGNOSIS — J209 Acute bronchitis, unspecified: Secondary | ICD-10-CM | POA: Diagnosis not present

## 2021-06-21 LAB — PROCALCITONIN: Procalcitonin: <0.1 ng/mL

## 2021-06-21 MED ORDER — LIDOCAINE 5 % EX PTCH
1.0000 | MEDICATED_PATCH | CUTANEOUS | Status: DC
  Administered 2021-06-22: 1 via TRANSDERMAL
  Filled 2021-06-21: qty 1

## 2021-06-21 MED ORDER — PREDNISONE 20 MG PO TABS
40.0000 mg | ORAL_TABLET | Freq: Every day | ORAL | Status: DC
  Administered 2021-06-22: 40 mg via ORAL
  Filled 2021-06-21: qty 2

## 2021-06-21 MED ORDER — MOMETASONE FURO-FORMOTEROL FUM 200-5 MCG/ACT IN AERO
2.0000 | INHALATION_SPRAY | Freq: Two times a day (BID) | RESPIRATORY_TRACT | Status: DC
  Administered 2021-06-22: 2 via RESPIRATORY_TRACT
  Filled 2021-06-21: qty 8.8

## 2021-06-21 NOTE — Progress Notes (Signed)
   06/21/21 1200  Mobility  Activity Ambulated in hall  Level of Assistance Independent after set-up  Assistive Device None  Distance Ambulated (ft) 1000 ft  Mobility Ambulated independently in hallway  Mobility Response Tolerated well  Mobility performed by Mobility specialist  $Mobility charge 1 Mobility    Pt was taken off O2 30mn PTA, but Pt on 1L [place] while ambulating. She ambulated about 10049fand tolerated very well. She stated that she was having back pain, but believes it's from being sedentary. No other complaints. Left in chair with call bell at side.   Mobility Specialist - Progress Note Pre-mobility: SpO2 93% RA During mobility: SpO2 96%, 97%, 99% 1L Post-mobility: SPO2 94% 1L   [facility] Acute Rehab Services Office: [phone]

## 2021-06-21 NOTE — Progress Notes (Signed)
SATURATION QUALIFICATIONS: (This note is used to comply with regulatory documentation for home oxygen)  Patient Saturations on Room Air at Rest = 93%  Patient Saturations on Room Air while Ambulating = 93%  Patient Saturations on 1 Liters of oxygen while Ambulating = 96%  Please briefly explain why patient needs home oxygen:

## 2021-06-21 NOTE — Care Management Important Message (Signed)
Important Message  Patient Details IM Letter given to the Patient. Name: Veronica Barrett MRN: [id number] Date of Birth: 11-02-1951   Medicare Important Message Given:  Yes     Kerin Salen 06/21/2021, 11:16 AM

## 2021-06-21 NOTE — Progress Notes (Signed)
PROGRESS NOTE  Veronica Barrett [id number] DOB: May 27, 1951   PCP: [name], PA-C  Patient is from: Home.  DOA: 06/17/2021 LOS: 3  Chief complaints:  Chief Complaint  Patient presents with   Shortness of Breath     Brief Narrative / Interim history: 70 year old F with PMH of reactive airway disease, seasonal allergies, anxiety and depression presenting with persistent dyspnea, cough and wheezing for 2 weeks despite course of steroid and bronchodilators outpatient, and admitted for acute tracheobronchitis.  CXR without acute finding.  COVID-19 and influenza PCR nonreactive.  Procalcitonin negative.  Started on systemic steroid and breathing treatments.  Azithromycin added on 8/2.  Patient continued to be hypoxic with respiratory distress.  Increased systemic steroid and added nebulizers.  Seems to be improving now.   Subjective: Seen and examined earlier this morning.  No major events overnight of this morning.  She could not sleep due to left chest wall pain.  She also did not get the evening Solu-Medrol after pharmacy adjusted Solu-Medrol dosing.   Objective: Vitals:   06/21/21 1359 06/21/21 2033 06/21/21 2039 06/21/21 2041  BP: 122/65 109/73    Pulse: 71 62    Resp:  18    Temp: 98.2 F (36.8 C) 98.5 F (36.9 C)    TempSrc:  Oral    SpO2: 95% 95% 95% 95%  Weight:        Intake/Output Summary (Last 24 hours) at 06/21/2021 2211 Last data filed at 06/21/2021 2200 Gross per 24 hour  Intake 1560 ml  Output 0 ml  Net 1560 ml   Filed Weights   06/20/21 0607  Weight: 78.5 kg    Examination:  GENERAL: No apparent distress.  Nontoxic. HEENT: MMM.  Vision and hearing grossly intact.  NECK: Supple.  No apparent JVD.  RESP:  No IWOB.  Fair aeration bilaterally.  Coughing after deep inhalation. CVS:  RRR. Heart sounds normal.  ABD/GI/GU: BS+. Abd soft, NTND.  MSK/EXT:  Moves extremities. No apparent deformity. No edema.  Tenderness over left lateral chest wall posteriorly.   No apparent deformity SKIN: no apparent skin lesion or wound NEURO: Awake and alert. Oriented appropriately.  No apparent focal neuro deficit. PSYCH: Calm. Normal affect.   Procedures:  None  Microbiology summarized: [id number] and influenza PCR nonreactive. RVP pending.  Assessment & Plan: Acute respiratory failure with hypoxia due to acute bronchitis-suspect viral etiology.  COVID-19, influenza, full RVP panel negative.  Procalcitonin negative as well.  No formal diagnosis of asthma but treated clinically with steroid and inhaler about 10 years ago.  No eosinophilia on differential.  Clinical picture not consistent with VTE.  Hypoxia and respiratory distress improved after increasing Solu-Medrol and adjusting nebulizers.   -Received IV Solu-Medrol 125 mg early in the morning.  Switch to prednisone in the morning -We will complete 5 days of azithromycin on 8/6. -Continue scheduled and as needed nebs.  Will change to inhalers in the morning -Mucolytic's/antitussive/incentive spirometry/flutter valve/OOB/PT/OT -Needs outpatient PFT/spirometry  Left lateral chest wall pain-likely muscle strain from coughing in the setting of COPD.  X-ray without displaced fracture or other radial graphic abnormality of the left ribs to explain the pain. -Continue as needed Tylenol and Norco -Trial Lidoderm patch.  Leukocytosis/bandemia-likely demargination from steroid.  Resolved.  Anxiety and depression -Continue home citalopram         DVT prophylaxis:  enoxaparin (LOVENOX) injection 40 mg Start: 06/17/21 2200  Code Status: Full code Family Communication: Patient and Therapist, sports.  No family member at  bedside. Level of care: Med-Surg Status is: Inpatient  Remains inpatient appropriate because:IV treatments appropriate due to intensity of illness or inability to take PO and Inpatient level of care appropriate due to severity of illness  Dispo: The patient is from: Home              Anticipated d/c is  to: Home              Patient currently is not medically stable to d/c.   Difficult to place patient No       Consultants:  None   Sch Meds:  Scheduled Meds:  arformoterol  15 mcg Nebulization BID   atorvastatin  20 mg Oral Daily   azithromycin  500 mg Oral Daily   budesonide (PULMICORT) nebulizer solution  0.25 mg Nebulization BID   citalopram  30 mg Oral Daily   enoxaparin (LOVENOX) injection  40 mg Subcutaneous Q24H   guaiFENesin  600 mg Oral BID   methylPREDNISolone (SOLU-MEDROL) injection  120 mg Intravenous Q24H   pantoprazole  40 mg Oral Daily   Continuous Infusions: PRN Meds:.acetaminophen **OR** acetaminophen, guaiFENesin-dextromethorphan, HYDROcodone-acetaminophen, ipratropium-albuterol, loratadine, melatonin, ondansetron **OR** ondansetron (ZOFRAN) IV, senna-docusate  Antimicrobials: Anti-infectives (From admission, onward)    Start     Dose/Rate Route Frequency Ordered Stop   06/18/21 1700  azithromycin (ZITHROMAX) tablet 500 mg        500 mg Oral Daily 06/18/21 1610 06/23/21 0959        I have personally reviewed the following labs and images: CBC: Recent Labs  Lab 06/17/21 1130 06/18/21 0402 06/20/21 0430  WBC 16.3* 11.2* 8.5  NEUTROABS  --  9.7* 6.9  HGB 13.4 12.9 12.7  HCT 41.2 40.2 39.6  MCV 96.5 97.3 95.7  PLT 250 242 236   BMP &GFR Recent Labs  Lab 06/17/21 1130 06/18/21 0402 06/19/21 0408  NA 137 137 137  K 3.7 4.7 5.1  CL 100 106 106  CO2 '26 25 25  '$ GLUCOSE 102* 134* 104*  BUN 7* 11 20  CREATININE 0.64 0.64 0.66  CALCIUM 9.3 9.2 9.1   Estimated Creatinine Clearance: 68.7 mL/min (by C-G formula based on SCr of 0.66 mg/dL). Liver & Pancreas: No results for input(s): AST, ALT, ALKPHOS, BILITOT, PROT, ALBUMIN in the last 168 hours. No results for input(s): LIPASE, AMYLASE in the last 168 hours. No results for input(s): AMMONIA in the last 168 hours. Diabetic: No results for input(s): HGBA1C in the last 72 hours. No results for  input(s): GLUCAP in the last 168 hours. Cardiac Enzymes: No results for input(s): CKTOTAL, CKMB, CKMBINDEX, TROPONINI in the last 168 hours. No results for input(s): PROBNP in the last 8760 hours. Coagulation Profile: No results for input(s): INR, PROTIME in the last 168 hours. Thyroid Function Tests: No results for input(s): TSH, T4TOTAL, FREET4, T3FREE, THYROIDAB in the last 72 hours. Lipid Profile: No results for input(s): CHOL, HDL, LDLCALC, TRIG, CHOLHDL, LDLDIRECT in the last 72 hours. Anemia Panel: No results for input(s): VITAMINB12, FOLATE, FERRITIN, TIBC, IRON, RETICCTPCT in the last 72 hours. Urine analysis:    Component Value Date/Time   COLORURINE YELLOW 10/10/2014 1100   APPEARANCEUR CLEAR 10/10/2014 1100   LABSPEC 1.015 10/10/2014 1100   PHURINE 7.0 10/10/2014 1100   GLUCOSEU NEGATIVE 10/10/2014 1100   HGBUR NEGATIVE 10/10/2014 1100   BILIRUBINUR n 08/24/2018 1510   KETONESUR NEGATIVE 10/10/2014 1100   PROTEINUR Negative 08/24/2018 1510   PROTEINUR NEGATIVE 10/10/2014 1100   UROBILINOGEN 0.2 08/24/2018 1510  UROBILINOGEN 0.2 10/10/2014 1100   NITRITE n 08/24/2018 1510   NITRITE NEGATIVE 10/10/2014 1100   LEUKOCYTESUR Negative 08/24/2018 1510   Sepsis Labs: Invalid input(s): PROCALCITONIN, [place]  Microbiology: Recent Results (from the past 240 hour(s))  Resp Panel by RT-PCR (Flu A&B, Covid) Nasopharyngeal Swab     Status: None   Collection Time: 06/17/21 12:20 PM   Specimen: Nasopharyngeal Swab; Nasopharyngeal(NP) swabs in vial transport medium  Result Value Ref Range Status   SARS Coronavirus 2 by RT PCR NEGATIVE NEGATIVE Final    Comment: (NOTE) SARS-CoV-2 target nucleic acids are NOT DETECTED.  The SARS-CoV-2 RNA is generally detectable in upper respiratory specimens during the acute phase of infection. The lowest concentration of SARS-CoV-2 viral copies this assay can detect is 138 copies/mL. A negative result does not preclude  SARS-Cov-2 infection and should not be used as the sole basis for treatment or other patient management decisions. A negative result may occur with  improper specimen collection/handling, submission of specimen other than nasopharyngeal swab, presence of viral mutation(s) within the areas targeted by this assay, and inadequate number of viral copies(<138 copies/mL). A negative result must be combined with clinical observations, patient history, and epidemiological information. The expected result is Negative.  Fact Sheet for Patients:  EntrepreneurPulse.com.au  Fact Sheet for Healthcare Providers:  IncredibleEmployment.be  This test is no t yet approved or cleared by the [place] FDA and  has been authorized for detection and/or diagnosis of SARS-CoV-2 by FDA under an Emergency Use Authorization (EUA). This EUA will remain  in effect (meaning this test can be used) for the duration of the COVID-19 declaration under Section 564(b)(1) of the Act, 21 U.S.C.section 360bbb-3(b)(1), unless the authorization is terminated  or revoked sooner.       Influenza A by PCR NEGATIVE NEGATIVE Final   Influenza B by PCR NEGATIVE NEGATIVE Final    Comment: (NOTE) The Xpert Xpress SARS-CoV-2/FLU/RSV plus assay is intended as an aid in the diagnosis of influenza from Nasopharyngeal swab specimens and should not be used as a sole basis for treatment. Nasal washings and aspirates are unacceptable for Xpert Xpress SARS-CoV-2/FLU/RSV testing.  Fact Sheet for Patients: EntrepreneurPulse.com.au  Fact Sheet for Healthcare Providers: IncredibleEmployment.be  This test is not yet approved or cleared by the [place] FDA and has been authorized for detection and/or diagnosis of SARS-CoV-2 by FDA under an Emergency Use Authorization (EUA). This EUA will remain in effect (meaning this test can be used) for the duration of  the COVID-19 declaration under Section 564(b)(1) of the Act, 21 U.S.C. section 360bbb-3(b)(1), unless the authorization is terminated or revoked.  Performed at [facility], [address] [place], [place] [address]   Respiratory (~20 pathogens) panel by PCR     Status: None   Collection Time: 06/19/21 10:54 AM   Specimen: Nasopharyngeal Swab; Respiratory  Result Value Ref Range Status   Adenovirus NOT DETECTED NOT DETECTED Final   Coronavirus 229E NOT DETECTED NOT DETECTED Final    Comment: (NOTE) The Coronavirus on the Respiratory Panel, DOES NOT test for the novel  Coronavirus (2019 nCoV)    Coronavirus HKU1 NOT DETECTED NOT DETECTED Final   Coronavirus NL63 NOT DETECTED NOT DETECTED Final   Coronavirus OC43 NOT DETECTED NOT DETECTED Final   Metapneumovirus NOT DETECTED NOT DETECTED Final   Rhinovirus / Enterovirus NOT DETECTED NOT DETECTED Final   Influenza A NOT DETECTED NOT DETECTED Final   Influenza B NOT DETECTED NOT DETECTED Final  Parainfluenza Virus 1 NOT DETECTED NOT DETECTED Final   Parainfluenza Virus 2 NOT DETECTED NOT DETECTED Final   Parainfluenza Virus 3 NOT DETECTED NOT DETECTED Final   Parainfluenza Virus 4 NOT DETECTED NOT DETECTED Final   Respiratory Syncytial Virus NOT DETECTED NOT DETECTED Final   Bordetella pertussis NOT DETECTED NOT DETECTED Final   Bordetella Parapertussis NOT DETECTED NOT DETECTED Final   Chlamydophila pneumoniae NOT DETECTED NOT DETECTED Final   Mycoplasma pneumoniae NOT DETECTED NOT DETECTED Final    Comment: Performed at [facility], [place] [address], [place], [place] [address]    Radiology Studies: DG Ribs Unilateral Left  Result Date: 06/21/2021 CLINICAL DATA:  Left posterior rib pain EXAM: LEFT RIBS - 2 VIEW COMPARISON:  06/17/2021 FINDINGS: No fracture or other bone lesions are seen involving the ribs. IMPRESSION: No displaced fracture or other radiographic abnormality of the left ribs to explain  pain. Electronically Signed   By: Eddie Candle M.D.   On: 06/21/2021 14:58      [place] [address] [place]  If 7PM-7AM, please contact night-coverage [email or web address] 06/21/2021, 10:11 PM

## 2021-06-22 DIAGNOSIS — R0789 Other chest pain: Secondary | ICD-10-CM

## 2021-06-22 DIAGNOSIS — J3089 Other allergic rhinitis: Secondary | ICD-10-CM

## 2021-06-22 DIAGNOSIS — D72825 Bandemia: Secondary | ICD-10-CM | POA: Diagnosis not present

## 2021-06-22 DIAGNOSIS — J209 Acute bronchitis, unspecified: Secondary | ICD-10-CM | POA: Diagnosis not present

## 2021-06-22 DIAGNOSIS — F419 Anxiety disorder, unspecified: Secondary | ICD-10-CM | POA: Diagnosis not present

## 2021-06-22 MED ORDER — GUAIFENESIN ER 600 MG PO TB12: 600.0000 mg | ORAL_TABLET | Freq: Two times a day (BID) | ORAL | 0 refills | Status: AC

## 2021-06-22 MED ORDER — PREDNISONE 10 MG PO TABS: ORAL_TABLET | ORAL | 0 refills | Status: AC

## 2021-06-22 MED ORDER — PANTOPRAZOLE SODIUM 40 MG PO TBEC: 40.0000 mg | DELAYED_RELEASE_TABLET | Freq: Every day | ORAL | 0 refills | Status: DC

## 2021-06-22 MED ORDER — FLUTICASONE-SALMETEROL 250-50 MCG/ACT IN AEPB: 1.0000 | INHALATION_SPRAY | Freq: Two times a day (BID) | RESPIRATORY_TRACT | 1 refills | Status: DC

## 2021-06-22 MED ORDER — IPRATROPIUM-ALBUTEROL 0.5-2.5 (3) MG/3ML IN SOLN: 3.0000 mL | Freq: Four times a day (QID) | RESPIRATORY_TRACT | 1 refills | Status: DC | PRN

## 2021-06-22 MED ORDER — HYDROCODONE-ACETAMINOPHEN 5-325 MG PO TABS: 1.0000 | ORAL_TABLET | Freq: Every evening | ORAL | 0 refills | Status: AC | PRN

## 2021-06-22 MED ORDER — FLUTICASONE PROPIONATE 50 MCG/ACT NA SUSP: 1.0000 | Freq: Every day | NASAL | 2 refills | Status: DC

## 2021-06-22 MED ORDER — ACETAMINOPHEN 500 MG PO TABS: 1000.0000 mg | ORAL_TABLET | Freq: Three times a day (TID) | ORAL | 0 refills | Status: AC | PRN

## 2021-06-22 NOTE — Progress Notes (Signed)
Nurse reviewed discharge instructions with pt.  Pt verbalized understanding of discharge instructions, follow up appointments and new medications.  No concerns at time of discharge. 

## 2021-06-22 NOTE — Discharge Summary (Signed)
Physician Discharge Summary  Veronica Barrett [id number] DOB: 14-Jul-1951 DOA: 06/17/2021  PCP: [name], PA-C  Admit date: 06/17/2021 Discharge date: 06/22/2021  Admitted From: Home Disposition: Home  Recommendations for Outpatient Follow-up:  Follow ups as below. Ambulatory referral to pulmonology ordered. Please obtain CBC/BMP/Mag at follow up Please follow up on the following pending results: None  Home Health: Not indicated. Equipment/Devices: Not indicated.  Discharge Condition: Stable CODE STATUS: Full code   Follow-up Information     [name], PA-C. Schedule an appointment as soon as possible for a visit in 1 week(s).   Specialty: Physician Assistant Contact information: [place] [address] [phone]                 Hospital Course: 70 year old F with PMH of reactive airway disease, seasonal allergies, anxiety and depression presenting with persistent dyspnea, cough and wheezing for 2 weeks despite course of steroid and bronchodilators outpatient, and admitted for acute tracheobronchitis.  CXR without acute finding.  COVID-19 and influenza PCR nonreactive.  Procalcitonin negative.  Started on systemic steroid and breathing treatments.  Azithromycin added on 8/2.  Patient continued to be hypoxic with respiratory distress requiring up titration of his IV Solu-Medrol and adjustment of her nebulizers.  Eventually, patient's symptoms improved.  Hypoxemia resolved.  She was ambulated on room air and maintain saturation above 93% without distress.  She felt well and ready to go home.  She is discharged on quick prednisone taper, Advair and DuoNeb nebulizers.  Also encouraged to use Zyrtec and Flonase for allergy.  Protonix for GI prophylaxis and acid reflux.  Ambulatory referral to pulmonology ordered.  See individual problem list below for more on hospital course.  Discharge Diagnoses:  Acute respiratory failure with hypoxia  due to acute bronchitis-suspect viral etiology.  COVID-19, influenza, full RVP panel negative.  Procalcitonin negative as well.  No formal diagnosis of asthma but treated clinically with steroid and inhaler about 10 years ago.  No eosinophilia on differential.  Clinical picture not consistent with VTE.  Hypoxia and respiratory distress resolved with increased dose of IV Solu-Medrol and nebulizers.  Maintain saturation above 93% with ambulation on room air. -Discharged on week prednisone taper as below -Advair, DuoNeb, Flonase, Zyrtec and mucolytic's -Ambulatory referral to pulmonology for PFT and/or spirometry   Left lateral chest wall pain-likely muscle strain from coughing in the setting of COPD.  X-ray without displaced fracture or other radial graphic abnormality of the left ribs to explain the pain. -Continue Tylenol as needed -Norco 5/325 mg before bedtime as needed for pain, #5   Leukocytosis/bandemia-likely demargination from steroid.  Resolved.   Anxiety and depression -Continue home citalopram   Body mass index is 28.8 kg/m.            Discharge Exam: Vitals:   06/22/21 0610 06/22/21 0835  BP: 114/69   Pulse: (!) 50   Resp: 18   Temp: 98 F (36.7 C)   SpO2: 94% 92%    GENERAL: No apparent distress.  Nontoxic. HEENT: MMM.  Vision and hearing grossly intact.  NECK: Supple.  No apparent JVD.  RESP: On RA.  No IWOB.  Fair aeration bilaterally. CVS:  RRR. Heart sounds normal.  ABD/GI/GU: Bowel sounds present. Soft.  Some tenderness over left mid posterolateral chest wall. MSK/EXT:  Moves extremities. No apparent deformity. No edema.  SKIN: no apparent skin lesion or wound NEURO: Awake, alert and oriented appropriately.  No apparent focal neuro deficit.  PSYCH: Calm. Normal affect.   Discharge Instructions  Discharge Instructions     Ambulatory referral to Pulmonology   Complete by: As directed    Needs PFT/Spirometry   Reason for referral: Asthma/COPD   Call  MD for:  difficulty breathing, headache or visual disturbances   Complete by: As directed    Call MD for:  extreme fatigue   Complete by: As directed    Call MD for:  persistant dizziness or light-headedness   Complete by: As directed    Call MD for:  severe uncontrolled pain   Complete by: As directed    Call MD for:  temperature >100.4   Complete by: As directed    Diet general   Complete by: As directed    Discharge instructions   Complete by: As directed    It has been a pleasure taking care of you!  You were hospitalized due to acute bronchitis and possible asthma exacerbation.  You have been treated with steroid and breathing treatments, and your symptoms improved to the point we think it is safe to let you go home and follow-up with your primary care doctor.  We are discharging you on a steroid taper and breathing treatments to continue using.  We have sent a referral to pulmonology.  Someone will get in touch with you over the next 1 to 2 weeks to arrange this follow-up.  Please review your new medication list and the directions on your medications before you take them.   Take care,   Increase activity slowly   Complete by: As directed       Allergies as of 06/22/2021       Reactions   Seasonal Ic [cholestatin]    Tape Rash        Medication List     STOP taking these medications    Budesonide 90 MCG/ACT inhaler   Pulmicort Flexhaler 90 MCG/ACT inhaler Generic drug: Budesonide       TAKE these medications    acetaminophen 500 MG tablet Commonly known as: TYLENOL Take 2 tablets (1,000 mg total) by mouth every 8 (eight) hours as needed for up to 5 days for moderate pain. What changed: when to take this   acyclovir 200 MG capsule Commonly known as: ZOVIRAX Take 1 capsule (200 mg total) by mouth 5 (five) times daily as needed (breakouts).   albuterol 108 (90 Base) MCG/ACT inhaler Commonly known as: VENTOLIN HFA Inhale 2 puffs into the lungs every 6  (six) hours as needed for shortness of breath. What changed: Another medication with the same name was removed. Continue taking this medication, and follow the directions you see here.   alendronate 70 MG tablet Commonly known as: FOSAMAX Take 1 tablet (70 mg total) by mouth once a week. Take with a full glass of water on an empty stomach.   atorvastatin 20 MG tablet Commonly known as: LIPITOR Take 1 tablet (20 mg total) by mouth daily.   cetirizine 10 MG tablet Commonly known as: ZYRTEC Take 10 mg by mouth daily.   fluticasone 50 MCG/ACT nasal spray Commonly known as: Flonase Place 1 spray into both nostrils daily.   fluticasone-salmeterol 250-50 MCG/ACT Aepb Commonly known as: Advair Diskus Inhale 1 puff into the lungs in the morning and at bedtime.   guaiFENesin 600 MG 12 hr tablet Commonly known as: MUCINEX Take 1 tablet (600 mg total) by mouth 2 (two) times daily for 5 days.   HYDROcodone-acetaminophen 5-325 MG tablet Commonly known  as: NORCO/VICODIN Take 1 tablet by mouth at bedtime as needed for up to 5 days for moderate pain or severe pain.   ipratropium-albuterol 0.5-2.5 (3) MG/3ML Soln Commonly known as: DUONEB Take 3 mLs by nebulization every 6 (six) hours as needed (shortness of breath, cough or wheeze).   Nebulizer Mask Adult Misc 1 Inhaler by Does not apply route every 4 (four) hours as needed.   Nebulizer System All-In-One Misc Use with nebulized albuterol once every 4-6 hours for wheezing and shortness of breath.   pantoprazole 40 MG tablet Commonly known as: PROTONIX Take 1 tablet (40 mg total) by mouth daily.   predniSONE 10 MG tablet Commonly known as: DELTASONE Take 4 tablets (40 mg total) by mouth daily with breakfast for 1 day, THEN 3 tablets (30 mg total) daily with breakfast for 1 day, THEN 2 tablets (20 mg total) daily with breakfast for 1 day, THEN 1 tablet (10 mg total) daily with breakfast for 1 day. Start taking on: June 23, 2021 What  changed:  medication strength See the new instructions.       ASK your doctor about these medications    citalopram 20 MG tablet Commonly known as: CELEXA TAKE 1 AND 1/2 TABLETS DAILY BY MOUTH        Consultations: None  Procedures/Studies:  DG Chest 2 View  Result Date: 06/17/2021 CLINICAL DATA:  Shortness of breath, cough, left-sided chest pain EXAM: CHEST - 2 VIEW COMPARISON:  06/09/2021 FINDINGS: The heart size and mediastinal contours are within normal limits. Both lungs are clear. The visualized skeletal structures are unremarkable. IMPRESSION: No acute abnormality of the lungs. Electronically Signed   By: Eddie Candle M.D.   On: 06/17/2021 12:11   DG Chest 2 View  Result Date: 06/09/2021 CLINICAL DATA:  Wheezing and cough EXAM: CHEST - 2 VIEW COMPARISON:  None. FINDINGS: Normal heart size. Normal mediastinal contour. No pneumothorax. No pleural effusion. Lungs appear clear, with no acute consolidative airspace disease and no pulmonary edema. IMPRESSION: No active cardiopulmonary disease. Electronically Signed   By: Ilona Sorrel M.D.   On: 06/09/2021 10:18   DG Ribs Unilateral Left  Result Date: 06/21/2021 CLINICAL DATA:  Left posterior rib pain EXAM: LEFT RIBS - 2 VIEW COMPARISON:  06/17/2021 FINDINGS: No fracture or other bone lesions are seen involving the ribs. IMPRESSION: No displaced fracture or other radiographic abnormality of the left ribs to explain pain. Electronically Signed   By: Eddie Candle M.D.   On: 06/21/2021 14:58   DG Bone Density  Result Date: 06/05/2021 CLINICAL DATA:  Postmenopausal. History of a previous left hip fracture and replacement. EXAM: DUAL X-RAY ABSORPTIOMETRY (DXA) FOR BONE MINERAL DENSITY TECHNIQUE: Bone mineral density measurements are performed of the spine, hip, and forearm, as appropriate, per International Society of Clinical Densitometry recommendations. The pertinent regions of interest are reported below. Non-contributory values are  not reported. Images are obtained for bone mineral density measurement and are not obtained for diagnostic purposes. FINDINGS: LEFT FOREARM (1/3 RADIUS) Bone Mineral Density (BMD):  0.491 Young Adult T Score:  -3.4 Z Score:  -1.4 RIGHT FEMUR NECK Bone Mineral Density (BMD):  0.598 g/cm2 Young Adult T-Score: -2.3 Z-Score:  -0.5 Unit: This study was performed at [facility] on the [place] (S/N [phone]), software version 13.4.2. Scan quality: The scan quality is good. Exclusions: Left hip due to previous hip replacement and lumbar spine due to degenerative change. ASSESSMENT: Patient's diagnostic category is OSTEOPOROSIS by WHO Criteria. FRACTURE  RISK: INCREASED FRAX: World Health Organization FRAX assessment of absolute fracture risk is not calculated for this patient because the patient has osteoporosis and a history of prior hip fracture. COMPARISON: None. RECOMMENDATIONS 1. All patients should optimize calcium and vitamin D intake. 2. Consider FDA-approved medical therapies in postmenopausal women and men aged 41 years and older, based on the following: - A hip or vertebral (clinical or morphometric) fracture - T-score less than or equal to -2.5 at the femoral neck or spine after appropriate evaluation to exclude secondary causes - Low bone mass (T-score between -1.0 and -2.5 at the femoral neck or spine) and a 10-year probability of a hip fracture greater than or equal to 3% or a 10-year probability of a major osteoporosis-related fracture greater than or equal to 20% based on the [place]-adapted WHO algorithm - Clinician judgment and/or patient preferences may indicate treatment for people with 10-year fracture probabilities above or below these levels 3. Patients with diagnosis of osteoporosis or at high risk for fracture should have regular bone mineral density tests. For patients eligible for Medicare, routine testing is allowed once every 2 years. The testing frequency can be increased to one  year for patients who have rapidly progressing disease, those who are receiving or discontinuing medical therapy to restore bone mass, or have additional risk factors. Electronically Signed   By: Lajean Manes M.D.   On: 06/05/2021 08:14       The results of significant diagnostics from this hospitalization (including imaging, microbiology, ancillary and laboratory) are listed below for reference.     Microbiology: Recent Results (from the past 240 hour(s))  Resp Panel by RT-PCR (Flu A&B, Covid) Nasopharyngeal Swab     Status: None   Collection Time: 06/17/21 12:20 PM   Specimen: Nasopharyngeal Swab; Nasopharyngeal(NP) swabs in vial transport medium  Result Value Ref Range Status   SARS Coronavirus 2 by RT PCR NEGATIVE NEGATIVE Final    Comment: (NOTE) SARS-CoV-2 target nucleic acids are NOT DETECTED.  The SARS-CoV-2 RNA is generally detectable in upper respiratory specimens during the acute phase of infection. The lowest concentration of SARS-CoV-2 viral copies this assay can detect is 138 copies/mL. A negative result does not preclude SARS-Cov-2 infection and should not be used as the sole basis for treatment or other patient management decisions. A negative result may occur with  improper specimen collection/handling, submission of specimen other than nasopharyngeal swab, presence of viral mutation(s) within the areas targeted by this assay, and inadequate number of viral copies(<138 copies/mL). A negative result must be combined with clinical observations, patient history, and epidemiological information. The expected result is Negative.  Fact Sheet for Patients:  EntrepreneurPulse.com.au  Fact Sheet for Healthcare Providers:  IncredibleEmployment.be  This test is no t yet approved or cleared by the [place] FDA and  has been authorized for detection and/or diagnosis of SARS-CoV-2 by FDA under an Emergency Use Authorization (EUA).  This EUA will remain  in effect (meaning this test can be used) for the duration of the COVID-19 declaration under Section 564(b)(1) of the Act, 21 U.S.C.section 360bbb-3(b)(1), unless the authorization is terminated  or revoked sooner.       Influenza A by PCR NEGATIVE NEGATIVE Final   Influenza B by PCR NEGATIVE NEGATIVE Final    Comment: (NOTE) The Xpert Xpress SARS-CoV-2/FLU/RSV plus assay is intended as an aid in the diagnosis of influenza from Nasopharyngeal swab specimens and should not be used as a sole basis for treatment. Nasal washings and  aspirates are unacceptable for Xpert Xpress SARS-CoV-2/FLU/RSV testing.  Fact Sheet for Patients: EntrepreneurPulse.com.au  Fact Sheet for Healthcare Providers: IncredibleEmployment.be  This test is not yet approved or cleared by the [place] FDA and has been authorized for detection and/or diagnosis of SARS-CoV-2 by FDA under an Emergency Use Authorization (EUA). This EUA will remain in effect (meaning this test can be used) for the duration of the COVID-19 declaration under Section 564(b)(1) of the Act, 21 U.S.C. section 360bbb-3(b)(1), unless the authorization is terminated or revoked.  Performed at [facility], [address] [place], [place] [address]   Respiratory (~20 pathogens) panel by PCR     Status: None   Collection Time: 06/19/21 10:54 AM   Specimen: Nasopharyngeal Swab; Respiratory  Result Value Ref Range Status   Adenovirus NOT DETECTED NOT DETECTED Final   Coronavirus 229E NOT DETECTED NOT DETECTED Final    Comment: (NOTE) The Coronavirus on the Respiratory Panel, DOES NOT test for the novel  Coronavirus (2019 nCoV)    Coronavirus HKU1 NOT DETECTED NOT DETECTED Final   Coronavirus NL63 NOT DETECTED NOT DETECTED Final   Coronavirus OC43 NOT DETECTED NOT DETECTED Final   Metapneumovirus NOT DETECTED NOT DETECTED Final   Rhinovirus / Enterovirus NOT  DETECTED NOT DETECTED Final   Influenza A NOT DETECTED NOT DETECTED Final   Influenza B NOT DETECTED NOT DETECTED Final   Parainfluenza Virus 1 NOT DETECTED NOT DETECTED Final   Parainfluenza Virus 2 NOT DETECTED NOT DETECTED Final   Parainfluenza Virus 3 NOT DETECTED NOT DETECTED Final   Parainfluenza Virus 4 NOT DETECTED NOT DETECTED Final   Respiratory Syncytial Virus NOT DETECTED NOT DETECTED Final   Bordetella pertussis NOT DETECTED NOT DETECTED Final   Bordetella Parapertussis NOT DETECTED NOT DETECTED Final   Chlamydophila pneumoniae NOT DETECTED NOT DETECTED Final   Mycoplasma pneumoniae NOT DETECTED NOT DETECTED Final    Comment: Performed at [facility], [address], [place], [place] [address]     Labs:  CBC: Recent Labs  Lab 06/17/21 1130 06/18/21 0402 06/20/21 0430  WBC 16.3* 11.2* 8.5  NEUTROABS  --  9.7* 6.9  HGB 13.4 12.9 12.7  HCT 41.2 40.2 39.6  MCV 96.5 97.3 95.7  PLT 250 242 236   BMP &GFR Recent Labs  Lab 06/17/21 1130 06/18/21 0402 06/19/21 0408  NA 137 137 137  K 3.7 4.7 5.1  CL 100 106 106  CO2 '26 25 25  '$ GLUCOSE 102* 134* 104*  BUN 7* 11 20  CREATININE 0.64 0.64 0.66  CALCIUM 9.3 9.2 9.1   Estimated Creatinine Clearance: 68.7 mL/min (by C-G formula based on SCr of 0.66 mg/dL). Liver & Pancreas: No results for input(s): AST, ALT, ALKPHOS, BILITOT, PROT, ALBUMIN in the last 168 hours. No results for input(s): LIPASE, AMYLASE in the last 168 hours. No results for input(s): AMMONIA in the last 168 hours. Diabetic: No results for input(s): HGBA1C in the last 72 hours. No results for input(s): GLUCAP in the last 168 hours. Cardiac Enzymes: No results for input(s): CKTOTAL, CKMB, CKMBINDEX, TROPONINI in the last 168 hours. No results for input(s): PROBNP in the last 8760 hours. Coagulation Profile: No results for input(s): INR, PROTIME in the last 168 hours. Thyroid Function Tests: No results for input(s): TSH, T4TOTAL, FREET4,  T3FREE, THYROIDAB in the last 72 hours. Lipid Profile: No results for input(s): CHOL, HDL, LDLCALC, TRIG, CHOLHDL, LDLDIRECT in the last 72 hours. Anemia Panel: No results for input(s): VITAMINB12,  FOLATE, FERRITIN, TIBC, IRON, RETICCTPCT in the last 72 hours. Urine analysis:    Component Value Date/Time   COLORURINE YELLOW 10/10/2014 1100   APPEARANCEUR CLEAR 10/10/2014 1100   LABSPEC 1.015 10/10/2014 1100   PHURINE 7.0 10/10/2014 1100   GLUCOSEU NEGATIVE 10/10/2014 1100   HGBUR NEGATIVE 10/10/2014 1100   BILIRUBINUR n 08/24/2018 1510   KETONESUR NEGATIVE 10/10/2014 1100   PROTEINUR Negative 08/24/2018 1510   PROTEINUR NEGATIVE 10/10/2014 1100   UROBILINOGEN 0.2 08/24/2018 1510   UROBILINOGEN 0.2 10/10/2014 1100   NITRITE n 08/24/2018 1510   NITRITE NEGATIVE 10/10/2014 1100   LEUKOCYTESUR Negative 08/24/2018 1510   Sepsis Labs: Invalid input(s): PROCALCITONIN, LACTICIDVEN   Time coordinating discharge: 35 minutes  SIGNED:  Mercy Riding, MD  [facility] 06/22/2021, 6:21 PM  If 7PM-7AM, please contact night-coverage [email or web address]

## 2021-06-26 ENCOUNTER — Encounter: Payer: Self-pay | Admitting: Physician Assistant

## 2021-06-26 ENCOUNTER — Other Ambulatory Visit: Payer: Self-pay

## 2021-06-26 ENCOUNTER — Ambulatory Visit (INDEPENDENT_AMBULATORY_CARE_PROVIDER_SITE_OTHER): Payer: Medicare Other | Admitting: Physician Assistant

## 2021-06-26 ENCOUNTER — Telehealth: Payer: Self-pay | Admitting: Emergency Medicine

## 2021-06-26 VITALS — BP 93/62 | HR 70 | Temp 97.0°F | Ht 66.0 in | Wt 175.4 lb

## 2021-06-26 DIAGNOSIS — Z09 Encounter for follow-up examination after completed treatment for conditions other than malignant neoplasm: Secondary | ICD-10-CM

## 2021-06-26 DIAGNOSIS — J40 Bronchitis, not specified as acute or chronic: Secondary | ICD-10-CM

## 2021-06-26 DIAGNOSIS — R0781 Pleurodynia: Secondary | ICD-10-CM

## 2021-06-26 DIAGNOSIS — F339 Major depressive disorder, recurrent, unspecified: Secondary | ICD-10-CM

## 2021-06-26 DIAGNOSIS — J45901 Unspecified asthma with (acute) exacerbation: Secondary | ICD-10-CM | POA: Diagnosis not present

## 2021-06-26 DIAGNOSIS — D72829 Elevated white blood cell count, unspecified: Secondary | ICD-10-CM

## 2021-06-26 NOTE — Patient Instructions (Signed)
Asthma, Adult  Asthma is a long-term (chronic) condition in which the airways get tight and narrow. The airways are the breathing passages that lead from the nose and mouth down into the lungs. A person with asthma will have times when symptoms get worse. These are called asthma attacks. They can cause coughing, whistling sounds when you breathe (wheezing), shortness of breath, and chest pain. They can make it hard to breathe. Thereis no cure for asthma, but medicines and lifestyle changes can help control it. There are many things that can bring on an asthma attack or make asthma symptoms worse (triggers). Common triggers include: Mold. Dust. Cigarette smoke. Cockroaches. Things that can cause allergy symptoms (allergens). These include animal skin flakes (dander) and pollen from trees or grass. Things that pollute the air. These may include household cleaners, wood smoke, smog, or chemical odors. Cold air, weather changes, and wind. Crying or laughing hard. Stress. Certain medicines or drugs. Certain foods such as dried fruit, potato chips, and grape juice. Infections, such as a cold or the flu. Certain medical conditions or diseases. Exercise or tiring activities. Asthma may be treated with medicines and by staying away from the things that cause asthma attacks. Types of medicines may include: Controller medicines. These help prevent asthma symptoms. They are usually taken every day. Fast-acting reliever or rescue medicines. These quickly relieve asthma symptoms. They are used as needed and provide short-term relief. Allergy medicines if your attacks are brought on by allergens. Medicines to help control the body's defense (immune) system. Follow these instructions at home: Avoiding triggers in your home Change your heating and air conditioning filter often. Limit your use of fireplaces and wood stoves. Get rid of pests (such as roaches and mice) and their droppings. Throw away plants  if you see mold on them. Clean your floors. Dust regularly. Use cleaning products that do not smell. Have someone vacuum when you are not home. Use a vacuum cleaner with a HEPA filter if possible. Replace carpet with wood, tile, or vinyl flooring. Carpet can trap animal skin flakes and dust. Use allergy-proof pillows, mattress covers, and box spring covers. Wash bed sheets and blankets every week in hot water. Dry them in a dryer. Keep your bedroom free of any triggers. Avoid pets and keep windows closed when things that cause allergy symptoms are in the air. Use blankets that are made of polyester or cotton. Clean bathrooms and kitchens with bleach. If possible, have someone repaint the walls in these rooms with mold-resistant paint. Keep out of the rooms that are being cleaned and painted. Wash your hands often with soap and water. If soap and water are not available, use hand sanitizer. Do not allow anyone to smoke in your home. General instructions Take over-the-counter and prescription medicines only as told by your doctor. Talk with your doctor if you have questions about how or when to take your medicines. Make note if you need to use your medicines more often than usual. Do not use any products that contain nicotine or tobacco, such as cigarettes and e-cigarettes. If you need help quitting, ask your doctor. Stay away from secondhand smoke. Avoid doing things outdoors when allergen counts are high and when air quality is low. Wear a ski mask when doing outdoor activities in the winter. The mask should cover your nose and mouth. Exercise indoors on cold days if you can. Warm up before you exercise. Take time to cool down after exercise. Use a peak flow meter as  told by your doctor. A peak flow meter is a tool that measures how well the lungs are working. Keep track of the peak flow meter's readings. Write them down. Follow your asthma action plan. This is a written plan for taking care  of your asthma and treating your attacks. Make sure you get all the shots (vaccines) that your doctor recommends. Ask your doctor about a flu shot and a pneumonia shot. Keep all follow-up visits as told by your doctor. This is important. Contact a doctor if: You have wheezing, shortness of breath, or a cough even while taking medicine to prevent attacks. The mucus you cough up (sputum) is thicker than usual. The mucus you cough up changes from clear or white to yellow, green, gray, or bloody. You have problems from the medicine you are taking, such as: A rash. Itching. Swelling. Trouble breathing. You need reliever medicines more than 2-3 times a week. Your peak flow reading is still at 50-79% of your personal best after following the action plan for 1 hour. You have a fever. Get help right away if: You seem to be worse and are not responding to medicine during an asthma attack. You are short of breath even at rest. You get short of breath when doing very little activity. You have trouble eating, drinking, or talking. You have chest pain or tightness. You have a fast heartbeat. Your lips or fingernails start to turn blue. You are light-headed or dizzy, or you faint. Your peak flow is less than 50% of your personal best. You feel too tired to breathe normally. Summary Asthma is a long-term (chronic) condition in which the airways get tight and narrow. An asthma attack can make it hard to breathe. Asthma cannot be cured, but medicines and lifestyle changes can help control it. Make sure you understand how to avoid triggers and how and when to use your medicines. This information is not intended to replace advice given to you by your health care provider. Make sure you discuss any questions you have with your healthcare provider. Document Revised: 03/07/2020 Document Reviewed: 03/07/2020 Elsevier Patient Education  2022 [employer].

## 2021-06-26 NOTE — Telephone Encounter (Signed)
Patient scheduled by front staff with Dr. Silas Flood 06/27/21 for Consult.

## 2021-06-26 NOTE — Progress Notes (Signed)
Established Patient Office Visit  Subjective:  Patient ID: Veronica Barrett, female    DOB: Feb 12, 1951  Age: 70 y.o. MRN: [id number]  CC:  Chief Complaint  Patient presents with   Hospitalization Follow-up    HPI Veronica Barrett presents for hospital follow up. Patient was admitted 06/17/2021 for acute bronchitis with bronchospasm, acute respiratory failure with hypoxia due to acute bronchitis, leukocytosis and discharged 06/22/2021. Patient reports slowly feeling better. Does report generalized weakness. Has one dose left of prednisone. Reports cough improving with Mucinex. States has not needed to do nebulizer treatment recently. Rib pain is also improving.    Discharge Summary  Admit date: 06/17/2021 Discharge date: 06/22/2021   Admitted From: Home Disposition: Home   Recommendations for Outpatient Follow-up:  Follow ups as below. Ambulatory referral to pulmonology ordered. Please obtain CBC/BMP/Mag at follow up Please follow up on the following pending results: None   Home Health: Not indicated. Equipment/Devices: Not indicated.   Discharge Condition: Stable CODE STATUS: Full code     Follow-up Information       [name], PA-C. Schedule an appointment as soon as possible for a visit in 1 week(s).   Specialty: Physician Assistant Contact information: [address] [place] [address] [phone]                          Hospital Course: 70 year old F with PMH of reactive airway disease, seasonal allergies, anxiety and depression presenting with persistent dyspnea, cough and wheezing for 2 weeks despite course of steroid and bronchodilators outpatient, and admitted for acute tracheobronchitis.  CXR without acute finding.  COVID-19 and influenza PCR nonreactive.  Procalcitonin negative.  Started on systemic steroid and breathing treatments.  Azithromycin added on 8/2.  Patient continued to be hypoxic with respiratory distress requiring up titration  of his IV Solu-Medrol and adjustment of her nebulizers.  Eventually, patient's symptoms improved.  Hypoxemia resolved.  She was ambulated on room air and maintain saturation above 93% without distress.  She felt well and ready to go home.  She is discharged on quick prednisone taper, Advair and DuoNeb nebulizers.  Also encouraged to use Zyrtec and Flonase for allergy.  Protonix for GI prophylaxis and acid reflux.  Ambulatory referral to pulmonology ordered.   See individual problem list below for more on hospital course.   Discharge Diagnoses:  Acute respiratory failure with hypoxia due to acute bronchitis-suspect viral etiology.  COVID-19, influenza, full RVP panel negative.  Procalcitonin negative as well.  No formal diagnosis of asthma but treated clinically with steroid and inhaler about 10 years ago.  No eosinophilia on differential.  Clinical picture not consistent with VTE.  Hypoxia and respiratory distress resolved with increased dose of IV Solu-Medrol and nebulizers.  Maintain saturation above 93% with ambulation on room air. -Discharged on week prednisone taper as below -Advair, DuoNeb, Flonase, Zyrtec and mucolytic's -Ambulatory referral to pulmonology for PFT and/or spirometry   Left lateral chest wall pain-likely muscle strain from coughing in the setting of COPD.  X-ray without displaced fracture or other radial graphic abnormality of the left ribs to explain the pain. -Continue Tylenol as needed -Norco 5/325 mg before bedtime as needed for pain, #5   Leukocytosis/bandemia-likely demargination from steroid.  Resolved.   Anxiety and depression -Continue home citalopram     Body mass index is 28.8 kg/m.       Discharge Exam:     Vitals:  06/22/21 0610 06/22/21 0835  BP: 114/69    Pulse: (!) 50    Resp: 18    Temp: 98 F (36.7 C)    SpO2: 94% 92%      GENERAL: No apparent distress.  Nontoxic. HEENT: MMM.  Vision and hearing grossly intact. NECK: Supple.  No  apparent JVD. RESP: On RA.  No IWOB.  Fair aeration bilaterally. CVS:  RRR. Heart sounds normal. ABD/GI/GU: Bowel sounds present. Soft.  Some tenderness over left mid posterolateral chest wall. MSK/EXT:  Moves extremities. No apparent deformity. No edema. SKIN: no apparent skin lesion or wound NEURO: Awake, alert and oriented appropriately.  No apparent focal neuro deficit. PSYCH: Calm. Normal affect.   Discharge Instructions   Discharge Instructions       Ambulatory referral to Pulmonology   Complete by: As directed      Needs PFT/Spirometry    Reason for referral: Asthma/COPD    Call MD for:  difficulty breathing, headache or visual disturbances   Complete by: As directed      Call MD for:  extreme fatigue   Complete by: As directed      Call MD for:  persistant dizziness or light-headedness   Complete by: As directed      Call MD for:  severe uncontrolled pain   Complete by: As directed      Call MD for:  temperature >100.4   Complete by: As directed      Diet general   Complete by: As directed      Discharge instructions   Complete by: As directed      It has been a pleasure taking care of you!   You were hospitalized due to acute bronchitis and possible asthma exacerbation.  You have been treated with steroid and breathing treatments, and your symptoms improved to the point we think it is safe to let you go home and follow-up with your primary care doctor.  We are discharging you on a steroid taper and breathing treatments to continue using.  We have sent a referral to pulmonology.  Someone will get in touch with you over the next 1 to 2 weeks to arrange this follow-up.   Please review your new medication list and the directions on your medications before you take them.     Take care,    Increase activity slowly   Complete by: As directed       Past Medical History:  Diagnosis Date   Allergy    seasonal allergy   Arthritis    left hip   Asthma    Depression     HSV infection    vag    Past Surgical History:  Procedure Laterality Date   APPENDECTOMY  1984   EYE SURGERY Left    left vitreous   TOTAL HIP ARTHROPLASTY Left 10/17/2014   Procedure: LEFT TOTAL HIP ARTHROPLASTY ANTERIOR APPROACH;  Surgeon: Mauri Pole, MD;  Location: [facility];  Service: Orthopedics;  Laterality: Left;    Family History  Problem Relation Age of Onset   Cancer Father    Cancer Sister    Thyroid cancer Sister    Arthritis Other    Colon cancer Neg Hx     Social History   Socioeconomic History   Marital status: Married    Spouse name: Not on file   Number of children: Not on file   Years of education: Not on file   Highest education level: Not on file  Occupational History   Not on file  Tobacco Use   Smoking status: Never   Smokeless tobacco: Never  Vaping Use   Vaping Use: Never used  Substance and Sexual Activity   Alcohol use: Yes    Alcohol/week: 10.0 standard drinks    Types: 10 Glasses of wine per week   Drug use: No   Sexual activity: Not Currently  Other Topics Concern   Not on file  Social History Narrative   Not on file   Social Determinants of Health   Financial Resource Strain: Not on file  Food Insecurity: Not on file  Transportation Needs: Not on file  Physical Activity: Not on file  Stress: Not on file  Social Connections: Not on file  Intimate Partner Violence: Not on file    Outpatient Medications Prior to Visit  Medication Sig Dispense Refill   acetaminophen (TYLENOL) 500 MG tablet Take 2 tablets (1,000 mg total) by mouth every 8 (eight) hours as needed for up to 5 days for moderate pain. 30 tablet 0   acyclovir (ZOVIRAX) 200 MG capsule Take 1 capsule (200 mg total) by mouth 5 (five) times daily as needed (breakouts). 100 capsule 3   alendronate (FOSAMAX) 70 MG tablet Take 1 tablet (70 mg total) by mouth once a week. Take with a full glass of water on an empty stomach. 12 tablet 1   atorvastatin (LIPITOR) 20 MG tablet  Take 1 tablet (20 mg total) by mouth daily. 90 tablet 1   cetirizine (ZYRTEC) 10 MG tablet Take 10 mg by mouth daily.     citalopram (CELEXA) 20 MG tablet TAKE 1 AND 1/2 TABLETS DAILY BY MOUTH (Patient taking differently: Take 30 mg by mouth daily.) 135 tablet 1   fluticasone (FLONASE) 50 MCG/ACT nasal spray Place 1 spray into both nostrils daily. 16 mL 2   fluticasone-salmeterol (ADVAIR DISKUS) 250-50 MCG/ACT AEPB Inhale 1 puff into the lungs in the morning and at bedtime. 1 each 1   guaiFENesin (MUCINEX) 600 MG 12 hr tablet Take 1 tablet (600 mg total) by mouth 2 (two) times daily for 5 days. 10 tablet 0   HYDROcodone-acetaminophen (NORCO/VICODIN) 5-325 MG tablet Take 1 tablet by mouth at bedtime as needed for up to 5 days for moderate pain or severe pain. 5 tablet 0   ipratropium-albuterol (DUONEB) 0.5-2.5 (3) MG/3ML SOLN Take 3 mLs by nebulization every 6 (six) hours as needed (shortness of breath, cough or wheeze). 360 mL 1   mometasone-formoterol (DULERA) 100-5 MCG/ACT AERO Inhale 2 puffs into the lungs 2 (two) times daily.     Nebulizer System All-In-One MISC Use with nebulized albuterol once every 4-6 hours for wheezing and shortness of breath. 1 each 0   pantoprazole (PROTONIX) 40 MG tablet Take 1 tablet (40 mg total) by mouth daily. 30 tablet 0   predniSONE (DELTASONE) 10 MG tablet Take 4 tablets (40 mg total) by mouth daily with breakfast for 1 day, THEN 3 tablets (30 mg total) daily with breakfast for 1 day, THEN 2 tablets (20 mg total) daily with breakfast for 1 day, THEN 1 tablet (10 mg total) daily with breakfast for 1 day. 10 tablet 0   Respiratory Therapy Supplies (NEBULIZER MASK ADULT) MISC 1 Inhaler by Does not apply route every 4 (four) hours as needed. 1 each 1   albuterol (VENTOLIN HFA) 108 (90 Base) MCG/ACT inhaler Inhale 2 puffs into the lungs every 6 (six) hours as needed for shortness of breath. (Patient not taking:  Reported on 06/26/2021) 1 each 1   No facility-administered  medications prior to visit.    Allergies  Allergen Reactions   Seasonal Ic [Cholestatin]    Tape Rash    ROS Review of Systems Review of Systems:  A fourteen system review of systems was performed and found to be positive as per HPI.   Objective:    Physical Exam General:  Well Developed, well nourished, appropriate for stated age.  Neuro:  Alert and oriented,  extra-ocular muscles intact  HEENT:  Normocephalic, atraumatic, neck supple Skin:  no gross rash, warm, pink. Cardiac:  RRR, S1 S2 Respiratory:  ECTA B/L w/ slight expiratory wheezing, no crackles or rales, Not using accessory muscles, speaking in full sentences- unlabored. Vascular:  Ext warm, no cyanosis apprec.; cap RF less 2 sec. Psych:  No HI/SI, judgement and insight good, Euthymic mood. Full Affect.  BP 93/62   Pulse 70   Temp (!) 97 F (36.1 C)   Ht 5' 6"  (1.676 m)   Wt 175 lb 6.4 oz (79.6 kg)   SpO2 96%   BMI 28.31 kg/m  Wt Readings from Last 3 Encounters:  06/26/21 175 lb 6.4 oz (79.6 kg)  06/20/21 173 lb 1 oz (78.5 kg)  06/12/21 183 lb (83 kg)     Health Maintenance Due  Topic Date Due   Hepatitis C Screening  Never done   Zoster Vaccines- Shingrix (1 of 2) Never done   PNA vac Low Risk Adult (2 of 2 - PPSV23) 09/01/2019   COVID-19 Vaccine (3 - Pfizer risk series) 02/16/2020   INFLUENZA VACCINE  06/17/2021    There are no preventive care reminders to display for this patient.  Lab Results  Component Value Date   TSH 2.930 04/05/2021   Lab Results  Component Value Date   WBC 8.5 06/20/2021   HGB 12.7 06/20/2021   HCT 39.6 06/20/2021   MCV 95.7 06/20/2021   PLT 236 06/20/2021   Lab Results  Component Value Date   NA 137 06/19/2021   K 5.1 06/19/2021   CO2 25 06/19/2021   GLUCOSE 104 (H) 06/19/2021   BUN 20 06/19/2021   CREATININE 0.66 06/19/2021   BILITOT 0.5 04/05/2021   ALKPHOS 103 04/05/2021   AST 19 04/05/2021   ALT 22 04/05/2021   PROT 6.7 04/05/2021   ALBUMIN 4.4  04/05/2021   CALCIUM 9.1 06/19/2021   ANIONGAP 6 06/19/2021   EGFR 94 04/05/2021   GFR 84.61 08/24/2018   Lab Results  Component Value Date   CHOL 147 04/05/2021   Lab Results  Component Value Date   HDL 53 04/05/2021   Lab Results  Component Value Date   LDLCALC 80 04/05/2021   Lab Results  Component Value Date   TRIG 71 04/05/2021   Lab Results  Component Value Date   CHOLHDL 2.8 04/05/2021   Lab Results  Component Value Date   HGBA1C 5.3 02/14/2020      Assessment & Plan:   Problem List Items Addressed This Visit       Respiratory   Acute asthma exacerbation   Relevant Medications   mometasone-formoterol (DULERA) 100-5 MCG/ACT AERO   Other Relevant Orders   Allergens w/Comp Rflx Area 2   Bronchitis     Other   Leukocytosis   Relevant Orders   CBC w/Diff   Other Visit Diagnoses     Hospital discharge follow-up    -  Primary   Relevant Orders   Comp  Met (CMET)   CBC w/Diff   Magnesium   Allergens w/Comp Rflx Area 2       Hospital discharge follow-up, Acute asthma exacerbation, Bronchitis: -Reviewed hospital notes, labs and imaging studies. Patient required high dose of IV corticosteroid to improve hypoxia and symptoms. Patient has mild expiratory wheezing on exam and oxygen saturation stable at 96% on RA. Will contact pulmonology to inquire status of referral. Patient will benefit from spirometry testing. -Recommend to continue Advair, DuoNeb nebulizer and albuterol inhaler as needed, Zyrtec and Flonase. Complete prednisone. -Will collect labs including environmental allergen panel to evaluate for potential triggers.  Leukocytosis: -Last WBC 8.5, will collect CBC.  Recurrent depression: -Stable. PHQ-9 score of 0. -Continue current medication regimen. -Will continue to monitor.  Rib pain on left side: -Improved. -Recommend to take Tylenol as needed for pain relief.    No orders of the defined types were placed in this  encounter.   Follow-up: Return if symptoms worsen or fail to improve.    [name], PA-C

## 2021-06-27 ENCOUNTER — Ambulatory Visit (INDEPENDENT_AMBULATORY_CARE_PROVIDER_SITE_OTHER): Payer: Medicare Other | Admitting: Pulmonary Disease

## 2021-06-27 ENCOUNTER — Encounter: Payer: Self-pay | Admitting: Pulmonary Disease

## 2021-06-27 VITALS — BP 106/74 | HR 64 | Temp 97.8°F | Ht 64.0 in | Wt 168.7 lb

## 2021-06-27 DIAGNOSIS — J309 Allergic rhinitis, unspecified: Secondary | ICD-10-CM | POA: Diagnosis not present

## 2021-06-27 DIAGNOSIS — J45901 Unspecified asthma with (acute) exacerbation: Secondary | ICD-10-CM

## 2021-06-27 MED ORDER — MOMETASONE FURO-FORMOTEROL FUM 200-5 MCG/ACT IN AERO: 2.0000 | INHALATION_SPRAY | Freq: Two times a day (BID) | RESPIRATORY_TRACT | 3 refills | Status: DC

## 2021-06-27 NOTE — Patient Instructions (Addendum)
Use the new high dose dulera as prescribed - stop the Advair you have. If too expensive let me know and we will do the high dose Advair but in the puffer form.  I expect you to continue to improve. If not, we may need to repeat labs and consider stronger medicines for asthma which I think has crept up and caused most issues.  Stop pantoprazole.  Reduce mucinex to once a day.  Return to clinic in 3 months or sooner as needed.

## 2021-06-27 NOTE — Progress Notes (Signed)
$'@Patient'c$  ID: Veronica Barrett, female    DOB: 05/12/1951, 70 y.o.   MRN: [id number]  Chief Complaint  Patient presents with   Consult    Was recently in hospital, urgent referral from PCP, feels loopy from meds, still getting winded and coughing up grey mucous    Referring provider: [name], PA-C  HPI:   70 y.o. woman whom we are seeing in consultation for evaluation of dyspnea on exertion.  PCP note reviewed.  Discharge summary and H&P reviewed from recent hospitalization.  Patient hospitalized earlier this month with wheezing, bronchitis.  She may treat with outpatient courses of prednisone as well as antibiotics.  Despite this her symptoms of not significant proved.  Mildly worsened.  There is mention of hypoxemia although I do not see any documented true hypoxemia, saturations low 90s on room air in the ED.  She was placed on oxygen supplementation during hospitalization.  She was put on higher dose steroids in the hospital and gradually her breathing and wheezing improved.  She was discharged on steroid taper, Dulera mid dose, Advair mid dose, DuoNebs although she has not used them, and rescue inhaler.  She feels her rescue inhaler use is decreased with time.  Overall her breathing has improved with time.  She took her last dose of prednisone today.  She continues have dyspnea on exertion although again improving.  Cough is improving as well.  Productive of gray sputum.  No time during day when things are better or worse.  No relieving exacerbating factors other albuterol seems to help some.  Reports being diagnosed with a touch of asthma about 10 years ago.  Endorses seasonal allergies, runny nose, itchy eyes.  Uses Zyrtec daily.  Asthma runs in the family.  Reviewed at length serial chest imaging on my interpretation 06/09/2021 demonstrates small retrocardiac opacity with what appears to be correlate in the base of the left lung along the posterior spine.  Subsequent chest x-ray about a  week later 06/2021 with improvement in both retrocardiac opacity and correlate on the lateral film.  Reviewed rib x-ray 06/21/2021 that did not demonstrate rib fracture on review of results.  PMH: Seasonal allergies, asthma Surgical history: Appendectomy, hip replacement Family history: Thyroid cancer in sister, father had other form of cancer, no Rattray illness in first relatives that she can recall Social history: She is a never smoker, lives in [place], near [facility], retired   Licensed conveyancer / Pulmonary Flowsheets:   ACT:  Asthma Control Test ACT Total Score  06/27/2021 9    MMRC: No flowsheet data found.  Epworth:  No flowsheet data found.  Tests:   FENO:  No results found for: NITRICOXIDE  PFT: No flowsheet data found.  WALK:  No flowsheet data found.  Imaging: Personally reviewed as per EMR discussion in this note DG Chest 2 View  Result Date: 06/17/2021 CLINICAL DATA:  Shortness of breath, cough, left-sided chest pain EXAM: CHEST - 2 VIEW COMPARISON:  06/09/2021 FINDINGS: The heart size and mediastinal contours are within normal limits. Both lungs are clear. The visualized skeletal structures are unremarkable. IMPRESSION: No acute abnormality of the lungs. Electronically Signed   By: Eddie Candle M.D.   On: 06/17/2021 12:11   DG Chest 2 View  Result Date: 06/09/2021 CLINICAL DATA:  Wheezing and cough EXAM: CHEST - 2 VIEW COMPARISON:  None. FINDINGS: Normal heart size. Normal mediastinal contour. No pneumothorax. No pleural effusion. Lungs appear clear, with no acute consolidative airspace disease and no  pulmonary edema. IMPRESSION: No active cardiopulmonary disease. Electronically Signed   By: Ilona Sorrel M.D.   On: 06/09/2021 10:18   DG Ribs Unilateral Left  Result Date: 06/21/2021 CLINICAL DATA:  Left posterior rib pain EXAM: LEFT RIBS - 2 VIEW COMPARISON:  06/17/2021 FINDINGS: No fracture or other bone lesions are seen involving the ribs. IMPRESSION: No  displaced fracture or other radiographic abnormality of the left ribs to explain pain. Electronically Signed   By: Eddie Candle M.D.   On: 06/21/2021 14:58   DG Bone Density  Result Date: 06/05/2021 CLINICAL DATA:  Postmenopausal. History of a previous left hip fracture and replacement. EXAM: DUAL X-RAY ABSORPTIOMETRY (DXA) FOR BONE MINERAL DENSITY TECHNIQUE: Bone mineral density measurements are performed of the spine, hip, and forearm, as appropriate, per International Society of Clinical Densitometry recommendations. The pertinent regions of interest are reported below. Non-contributory values are not reported. Images are obtained for bone mineral density measurement and are not obtained for diagnostic purposes. FINDINGS: LEFT FOREARM (1/3 RADIUS) Bone Mineral Density (BMD):  0.491 Young Adult T Score:  -3.4 Z Score:  -1.4 RIGHT FEMUR NECK Bone Mineral Density (BMD):  0.598 g/cm2 Young Adult T-Score: -2.3 Z-Score:  -0.5 Unit: This study was performed at [facility] on the [place] (S/N [phone]), software version 13.4.2. Scan quality: The scan quality is good. Exclusions: Left hip due to previous hip replacement and lumbar spine due to degenerative change. ASSESSMENT: Patient's diagnostic category is OSTEOPOROSIS by WHO Criteria. FRACTURE RISK: INCREASED FRAX: World Health Organization FRAX assessment of absolute fracture risk is not calculated for this patient because the patient has osteoporosis and a history of prior hip fracture. COMPARISON: None. RECOMMENDATIONS 1. All patients should optimize calcium and vitamin D intake. 2. Consider FDA-approved medical therapies in postmenopausal women and men aged 67 years and older, based on the following: - A hip or vertebral (clinical or morphometric) fracture - T-score less than or equal to -2.5 at the femoral neck or spine after appropriate evaluation to exclude secondary causes - Low bone mass (T-score between -1.0 and -2.5 at the femoral neck  or spine) and a 10-year probability of a hip fracture greater than or equal to 3% or a 10-year probability of a major osteoporosis-related fracture greater than or equal to 20% based on the [place]-adapted WHO algorithm - Clinician judgment and/or patient preferences may indicate treatment for people with 10-year fracture probabilities above or below these levels 3. Patients with diagnosis of osteoporosis or at high risk for fracture should have regular bone mineral density tests. For patients eligible for Medicare, routine testing is allowed once every 2 years. The testing frequency can be increased to one year for patients who have rapidly progressing disease, those who are receiving or discontinuing medical therapy to restore bone mass, or have additional risk factors. Electronically Signed   By: Lajean Manes M.D.   On: 06/05/2021 08:14    Lab Results: Personally reviewed, notably eosinophils no higher than 200 in the past CBC    Component Value Date/Time   WBC 10.3 06/26/2021 1533   WBC 8.5 06/20/2021 0430   RBC 4.54 06/26/2021 1533   RBC 4.14 06/20/2021 0430   HGB 13.9 06/26/2021 1533   HCT 41.8 06/26/2021 1533   PLT 331 06/26/2021 1533   MCV 92 06/26/2021 1533   MCH 30.6 06/26/2021 1533   MCH 30.7 06/20/2021 0430   MCHC 33.3 06/26/2021 1533   MCHC 32.1 06/20/2021 0430   RDW 12.0 06/26/2021  1533   LYMPHSABS 0.8 06/26/2021 1533   MONOABS 0.4 06/20/2021 0430   EOSABS 0.0 06/26/2021 1533   BASOSABS 0.0 06/26/2021 1533    BMET    Component Value Date/Time   NA 137 06/26/2021 1533   K 5.0 06/26/2021 1533   CL 102 06/26/2021 1533   CO2 22 06/26/2021 1533   GLUCOSE 119 (H) 06/26/2021 1533   GLUCOSE 104 (H) 06/19/2021 0408   BUN 13 06/26/2021 1533   CREATININE 0.76 06/26/2021 1533   CALCIUM 9.3 06/26/2021 1533   GFRNONAA >60 06/19/2021 0408   GFRAA 103 02/14/2020 1000    BNP    Component Value Date/Time   BNP 37.4 06/20/2021 0430    ProBNP No results found for:  PROBNP  Specialty Problems       Pulmonary Problems   Allergic rhinitis    Qualifier: Diagnosis of  By: Cari Caraway RN, Ventura Sellers       Acute bronchitis    Qualifier: Diagnosis of  By: Sarajane Jews MD, Annie Main A       Chronic sinusitis   Acute asthma exacerbation   Bronchitis    Allergies  Allergen Reactions   Seasonal Ic [Cholestatin]    Tape Rash    Immunization History  Administered Date(s) Administered   Influenza Whole 08/22/2009, 09/19/2010   Influenza, High Dose Seasonal PF 08/31/2018, 09/19/2019   Influenza,inj,Quad PF,6+ Mos 10/02/2014   Influenza-Unspecified 08/17/2014   PFIZER(Purple Top)SARS-COV-2 Vaccination 12/25/2019, 01/19/2020   Pneumococcal Conjugate-13 08/31/2018   Td 10/27/2000   Tdap 04/21/2013    Past Medical History:  Diagnosis Date   Allergy    seasonal allergy   Arthritis    left hip   Asthma    Depression    HSV infection    vag    Tobacco History: Social History   Tobacco Use  Smoking Status Never  Smokeless Tobacco Never   Counseling given: Yes   Continue to not smoke  Outpatient Encounter Medications as of 06/27/2021  Medication Sig   acetaminophen (TYLENOL) 500 MG tablet Take 2 tablets (1,000 mg total) by mouth every 8 (eight) hours as needed for up to 5 days for moderate pain.   acyclovir (ZOVIRAX) 200 MG capsule Take 1 capsule (200 mg total) by mouth 5 (five) times daily as needed (breakouts).   albuterol (VENTOLIN HFA) 108 (90 Base) MCG/ACT inhaler Inhale 2 puffs into the lungs every 6 (six) hours as needed for shortness of breath.   alendronate (FOSAMAX) 70 MG tablet Take 1 tablet (70 mg total) by mouth once a week. Take with a full glass of water on an empty stomach.   atorvastatin (LIPITOR) 20 MG tablet Take 1 tablet (20 mg total) by mouth daily.   cetirizine (ZYRTEC) 10 MG tablet Take 10 mg by mouth daily.   citalopram (CELEXA) 20 MG tablet TAKE 1 AND 1/2 TABLETS DAILY BY MOUTH (Patient taking differently: Take 30 mg  by mouth daily.)   fluticasone (FLONASE) 50 MCG/ACT nasal spray Place 1 spray into both nostrils daily.   guaiFENesin (MUCINEX) 600 MG 12 hr tablet Take 1 tablet (600 mg total) by mouth 2 (two) times daily for 5 days.   HYDROcodone-acetaminophen (NORCO/VICODIN) 5-325 MG tablet Take 1 tablet by mouth at bedtime as needed for up to 5 days for moderate pain or severe pain.   mometasone-formoterol (DULERA) 200-5 MCG/ACT AERO Inhale 2 puffs into the lungs in the morning and at bedtime.   Nebulizer System All-In-One MISC Use with nebulized albuterol once  every 4-6 hours for wheezing and shortness of breath.   predniSONE (DELTASONE) 10 MG tablet Take 4 tablets (40 mg total) by mouth daily with breakfast for 1 day, THEN 3 tablets (30 mg total) daily with breakfast for 1 day, THEN 2 tablets (20 mg total) daily with breakfast for 1 day, THEN 1 tablet (10 mg total) daily with breakfast for 1 day.   Respiratory Therapy Supplies (NEBULIZER MASK ADULT) MISC 1 Inhaler by Does not apply route every 4 (four) hours as needed.   [DISCONTINUED] fluticasone-salmeterol (ADVAIR DISKUS) 250-50 MCG/ACT AEPB Inhale 1 puff into the lungs in the morning and at bedtime.   [DISCONTINUED] mometasone-formoterol (DULERA) 100-5 MCG/ACT AERO Inhale 2 puffs into the lungs 2 (two) times daily.   [DISCONTINUED] pantoprazole (PROTONIX) 40 MG tablet Take 1 tablet (40 mg total) by mouth daily.   ipratropium-albuterol (DUONEB) 0.5-2.5 (3) MG/3ML SOLN Take 3 mLs by nebulization every 6 (six) hours as needed (shortness of breath, cough or wheeze). (Patient not taking: Reported on 06/27/2021)   No facility-administered encounter medications on file as of 06/27/2021.     Review of Systems  Review of Systems  No chest with exertion.  No orthopnea or PND.  No lower extremity swelling.  She does endorse ongoing wheezing.  Comprehensive review of systems otherwise negative. Physical Exam  BP 106/74 (BP Location: Left Arm, Cuff Size: Normal)    Pulse 64   Temp 97.8 F (36.6 C) (Oral)   Ht '5\' 4"'$  (1.626 m)   Wt 168 lb 11.2 oz (76.5 kg)   SpO2 97%   BMI 28.96 kg/m   Wt Readings from Last 5 Encounters:  06/27/21 168 lb 11.2 oz (76.5 kg)  06/26/21 175 lb 6.4 oz (79.6 kg)  06/20/21 173 lb 1 oz (78.5 kg)  06/12/21 183 lb (83 kg)  06/10/21 183 lb (83 kg)    BMI Readings from Last 5 Encounters:  06/27/21 28.96 kg/m  06/26/21 28.31 kg/m  06/20/21 28.80 kg/m  06/12/21 30.45 kg/m  06/10/21 30.45 kg/m     Physical Exam General: Well-appearing, no acute distress Eyes: EOMI, no icterus Neck: Supple, no JVP Cardiovascular: Regular rate and rhythm, no murmurs Pulmonary: Normal inspiratory sounds, diffuse and expiratory wheeze bilaterally, normal breathing on room air   Assessment & Plan:   Dyspnea on exertion: After acute illness start about 3 weeks ago.  Briefly hospitalized for severe bronchitis.  Possible small pneumonia on chest x-ray on my interpretation although not called by radiology.  Suspect triggered quiescent asthma given ongoing symptoms of wheeze on exam.  She has atopic symptoms.  Please see below.  We will plan to pursue PFTs if symptoms not continue to improve in the next several weeks.  Asthma: Atopic symptoms, wheezing, prolonged bronchitis responsive to steroids recently including hospitalization.  She is on multiple conflicting medications.  Simplified to high-dose Dulera, stop Advair.  May need different ICS/LABA pending insurance cover, would favor Advair HFA if alternative needed.  Suspect symptoms will improve with time.  Consider repeat phenotyping in the future, notably eos only as high as 200, IgE pending but collected during prednisone therapy so interpret with caution.   Return in about 3 months (around 09/27/2021).   Lanier Clam, MD 06/27/2021

## 2021-06-30 LAB — MAGNESIUM: Magnesium: 2.6 mg/dL — ABNORMAL HIGH (ref 1.6–2.3)

## 2021-06-30 LAB — ALLERGENS W/COMP RFLX AREA 2
Alternaria Alternata IgE: <0.1 kU/L
Aspergillus Fumigatus IgE: 0.16 kU/L — AB
Bermuda Grass IgE: <0.1 kU/L
Cedar, Mountain IgE: <0.1 kU/L
Cladosporium Herbarum IgE: <0.1 kU/L
Cockroach, German IgE: <0.1 kU/L
Common Silver Birch IgE: <0.1 kU/L
Cottonwood IgE: <0.1 kU/L
D Farinae IgE: <0.1 kU/L
D Pteronyssinus IgE: <0.1 kU/L
E001-IgE Cat Dander: 0.25 kU/L — AB
E005-IgE Dog Dander: <0.1 kU/L
Elm, American IgE: <0.1 kU/L
IgE (Immunoglobulin E), Serum: 131 IU/mL (ref 6–495)
Johnson Grass IgE: <0.1 kU/L
Maple/Box Elder IgE: <0.1 kU/L
Mouse Urine IgE: <0.1 kU/L
Oak, White IgE: <0.1 kU/L
Pecan, Hickory IgE: <0.1 kU/L
Penicillium Chrysogen IgE: 0.26 kU/L — AB
Pigweed, Rough IgE: <0.1 kU/L
Ragweed, Short IgE: <0.1 kU/L
Sheep Sorrel IgE Qn: <0.1 kU/L
Timothy Grass IgE: <0.1 kU/L
White Mulberry IgE: <0.1 kU/L

## 2021-06-30 LAB — CBC WITH DIFFERENTIAL/PLATELET
Basophils Absolute: 0 x10E3/uL (ref 0.0–0.2)
Basos: 0 %
EOS (ABSOLUTE): 0 x10E3/uL (ref 0.0–0.4)
Eos: 0 %
Hematocrit: 41.8 % (ref 34.0–46.6)
Hemoglobin: 13.9 g/dL (ref 11.1–15.9)
Immature Grans (Abs): 0.1 x10E3/uL (ref 0.0–0.1)
Immature Granulocytes: 1 %
Lymphocytes Absolute: 0.8 x10E3/uL (ref 0.7–3.1)
Lymphs: 7 %
MCH: 30.6 pg (ref 26.6–33.0)
MCHC: 33.3 g/dL (ref 31.5–35.7)
MCV: 92 fL (ref 79–97)
Monocytes Absolute: 0.3 x10E3/uL (ref 0.1–0.9)
Monocytes: 3 %
Neutrophils Absolute: 9.2 x10E3/uL — ABNORMAL HIGH (ref 1.4–7.0)
Neutrophils: 89 %
Platelets: 331 x10E3/uL (ref 150–450)
RBC: 4.54 x10E6/uL (ref 3.77–5.28)
RDW: 12 % (ref 11.7–15.4)
WBC: 10.3 x10E3/uL (ref 3.4–10.8)

## 2021-06-30 LAB — COMPREHENSIVE METABOLIC PANEL
ALT: 28 IU/L (ref 0–32)
AST: 15 IU/L (ref 0–40)
Albumin/Globulin Ratio: 3.1 — ABNORMAL HIGH (ref 1.2–2.2)
Albumin: 4.7 g/dL (ref 3.8–4.8)
Alkaline Phosphatase: 109 IU/L (ref 44–121)
BUN/Creatinine Ratio: 17 (ref 12–28)
BUN: 13 mg/dL (ref 8–27)
Bilirubin Total: 0.4 mg/dL (ref 0.0–1.2)
CO2: 22 mmol/L (ref 20–29)
Calcium: 9.3 mg/dL (ref 8.7–10.3)
Chloride: 102 mmol/L (ref 96–106)
Creatinine, Ser: 0.76 mg/dL (ref 0.57–1.00)
Globulin, Total: 1.5 g/dL (ref 1.5–4.5)
Glucose: 119 mg/dL — ABNORMAL HIGH (ref 65–99)
Potassium: 5 mmol/L (ref 3.5–5.2)
Sodium: 137 mmol/L (ref 134–144)
Total Protein: 6.2 g/dL (ref 6.0–8.5)
eGFR: 85 mL/min/{1.73_m2} (ref >59)

## 2021-07-01 ENCOUNTER — Telehealth: Payer: Self-pay | Admitting: Pulmonary Disease

## 2021-07-01 DIAGNOSIS — J209 Acute bronchitis, unspecified: Secondary | ICD-10-CM

## 2021-07-01 NOTE — Telephone Encounter (Signed)
Attempted to call pt but unable to reach. Left message for her to return call. 

## 2021-07-01 NOTE — Progress Notes (Signed)
Patient is aware of the below and verbalized understanding. AS, CMA 

## 2021-07-02 ENCOUNTER — Other Ambulatory Visit: Payer: Self-pay | Admitting: Physician Assistant

## 2021-07-02 DIAGNOSIS — F339 Major depressive disorder, recurrent, unspecified: Secondary | ICD-10-CM

## 2021-07-02 MED ORDER — ALBUTEROL SULFATE HFA 108 (90 BASE) MCG/ACT IN AERS: 2.0000 | INHALATION_SPRAY | Freq: Four times a day (QID) | RESPIRATORY_TRACT | 6 refills | Status: DC | PRN

## 2021-07-02 NOTE — Telephone Encounter (Signed)
Dr. Silas Flood, please advise if ok to send new script in for albuterol hfa. Albuterol has not been sent to pharmacy from our practice before.

## 2021-07-02 NOTE — Telephone Encounter (Signed)
Yes ok with script for albuterol 2 puff q6 hrs PRN wheezing, SOB

## 2021-07-02 NOTE — Telephone Encounter (Signed)
Called patient but she did not answer. Left message for her to call back. I have sent in the albuterol as requested.

## 2021-07-03 MED ORDER — FLUTICASONE-SALMETEROL 230-21 MCG/ACT IN AERO: 2.0000 | INHALATION_SPRAY | Freq: Two times a day (BID) | RESPIRATORY_TRACT | 12 refills | Status: DC

## 2021-07-03 NOTE — Telephone Encounter (Signed)
High dose adviar HFA sent. Stop Dulera.

## 2021-07-03 NOTE — Telephone Encounter (Signed)
Called and spoke with patient. She stated that she did not need a refill on the albuterol. She was calling in regards to the Advair.   She is currently on Dulera 200 as she was given a sample of this while in the hospital. It is currently causing her to experience some mild dizziness as well as an elevated heart rate. She checked on the price of the [facility] at the pharmacy and she can not afford it.   She has the Advair 250-63mg diskus at home but it only has one refill on it.   She re-read her AVS and saw where MH had mentioned that we could possibly [facility] She is interested in this and would like to have the RX sent in for her.   I advised her that [facility] before sending in the inhaler. She verbalized understanding.   MH, can you please advise? Thanks!

## 2021-07-03 NOTE — Telephone Encounter (Signed)
Pt stated that she did not need a refill for albuterol but she was requesting a refill for; [facility]; she stated that it was cost prohibited and she said that she thought that she was going to need a request for Advair. She is requesting assistance for advice on the medicine that she is currently taking, stated that she did not have any questions in regards to Albuterol and wants to know what Dr. Silas Flood wants her to be taking.  Pls regard; [phone]

## 2021-07-05 ENCOUNTER — Other Ambulatory Visit: Payer: Self-pay | Admitting: Physician Assistant

## 2021-07-05 DIAGNOSIS — J4541 Moderate persistent asthma with (acute) exacerbation: Secondary | ICD-10-CM

## 2021-07-11 ENCOUNTER — Other Ambulatory Visit: Payer: Self-pay | Admitting: Physician Assistant

## 2021-07-11 DIAGNOSIS — E785 Hyperlipidemia, unspecified: Secondary | ICD-10-CM

## 2021-07-12 NOTE — Telephone Encounter (Signed)
Called patient but she did not answer. Left message for her to call back.  

## 2021-08-01 DIAGNOSIS — Z1231 Encounter for screening mammogram for malignant neoplasm of breast: Secondary | ICD-10-CM | POA: Diagnosis not present

## 2021-08-01 LAB — HM MAMMOGRAPHY

## 2021-08-07 ENCOUNTER — Encounter: Payer: Self-pay | Admitting: Physician Assistant

## 2021-09-11 ENCOUNTER — Telehealth: Payer: Self-pay | Admitting: Pulmonary Disease

## 2021-09-11 DIAGNOSIS — Z23 Encounter for immunization: Secondary | ICD-10-CM | POA: Diagnosis not present

## 2021-09-11 NOTE — Telephone Encounter (Signed)
I called the patient back to give her the recommendations and per the last note from August the provider wants the patient to remain on the Advair.   I called [facility] to verify that a refill was still on file of the Advair and they will fill the advair for this patient.

## 2021-09-25 ENCOUNTER — Encounter: Payer: Self-pay | Admitting: Pulmonary Disease

## 2021-09-25 ENCOUNTER — Other Ambulatory Visit: Payer: Self-pay

## 2021-09-25 ENCOUNTER — Ambulatory Visit (INDEPENDENT_AMBULATORY_CARE_PROVIDER_SITE_OTHER): Payer: Medicare Other | Admitting: Pulmonary Disease

## 2021-09-25 VITALS — BP 122/70 | HR 63 | Temp 98.6°F | Ht 65.0 in | Wt 185.4 lb

## 2021-09-25 DIAGNOSIS — J4540 Moderate persistent asthma, uncomplicated: Secondary | ICD-10-CM

## 2021-09-25 DIAGNOSIS — K219 Gastro-esophageal reflux disease without esophagitis: Secondary | ICD-10-CM | POA: Diagnosis not present

## 2021-09-25 DIAGNOSIS — J309 Allergic rhinitis, unspecified: Secondary | ICD-10-CM

## 2021-09-25 MED ORDER — FLUTICASONE-SALMETEROL 250-50 MCG/ACT IN AEPB: 1.0000 | INHALATION_SPRAY | Freq: Two times a day (BID) | RESPIRATORY_TRACT | 6 refills | Status: DC

## 2021-09-25 NOTE — Progress Notes (Signed)
@Patient  ID: Veronica Barrett, female    DOB: [birth year], 70 y.o.   MRN: [id number]  Chief Complaint  Patient presents with   Follow-up    Pt states hoarseness and swelling in throat area and chest feel heavy    Referring provider: [name], PA-C  HPI:   70 y.o. woman whom we are seeing in follow up for evaluation of dyspnea on exertion.   Overall, DOE much improved. Walking, exercising regularly. 30 minutes at a time, no issues. Hoarseness persists. A bit worse. On high dose HFA Advair. Maybe correlates with worsening. Feels mainly allergies. Throat feels swollen, tight when swallowing, not when breathing. Denies any pain. Sensation is submandibular. Is having GERD symptoms since hospitalization.  Also with some chest tightness or pressure.  Not worse with exertion.  Not relieved with rest.  HPI at initial visit: Patient hospitalized earlier this month with wheezing, bronchitis.  She may treat with outpatient courses of prednisone as well as antibiotics.  Despite this her symptoms of not significant proved.  Mildly worsened.  There is mention of hypoxemia although I do not see any documented true hypoxemia, saturations low 90s on room air in the ED.  She was placed on oxygen supplementation during hospitalization.  She was put on higher dose steroids in the hospital and gradually her breathing and wheezing improved.  She was discharged on steroid taper, Dulera mid dose, Advair mid dose, DuoNebs although she has not used them, and rescue inhaler.  She feels her rescue inhaler use is decreased with time.  Overall her breathing has improved with time.  She took her last dose of prednisone today.  She continues have dyspnea on exertion although again improving.  Cough is improving as well.  Productive of gray sputum.  No time during day when things are better or worse.  No relieving exacerbating factors other albuterol seems to help some.  Reports being diagnosed with a touch of asthma about 10  years ago.  Endorses seasonal allergies, runny nose, itchy eyes.  Uses Zyrtec daily.  Asthma runs in the family.  Reviewed at length serial chest imaging on my interpretation 06/09/2021 demonstrates small retrocardiac opacity with what appears to be correlate in the base of the left lung along the posterior spine.  Subsequent chest x-ray about a week later 06/2021 with improvement in both retrocardiac opacity and correlate on the lateral film.  Reviewed rib x-ray 06/21/2021 that did not demonstrate rib fracture on review of results.  PMH: Seasonal allergies, asthma Surgical history: Appendectomy, hip replacement Family history: Thyroid cancer in sister, father had other form of cancer, no respiratory illness in first relatives that she can recall Social history: She is a never smoker, lives in [place], near [facility], retired   Licensed conveyancer / Pulmonary Flowsheets:   ACT:  Asthma Control Test ACT Total Score  09/25/2021 23  06/27/2021 9    MMRC: No flowsheet data found.  Epworth:  No flowsheet data found.  Tests:   FENO:  No results found for: NITRICOXIDE  PFT: No flowsheet data found.  WALK:  No flowsheet data found.  Imaging: Personally reviewed as per EMR discussion in this note No results found.  Lab Results: Personally reviewed, notably eosinophils no higher than 200 in the past CBC    Component Value Date/Time   WBC 10.3 06/26/2021 1533   WBC 8.5 06/20/2021 0430   RBC 4.54 06/26/2021 1533   RBC 4.14 06/20/2021 0430   HGB 13.9 06/26/2021 1533  HCT 41.8 06/26/2021 1533   PLT 331 06/26/2021 1533   MCV 92 06/26/2021 1533   MCH 30.6 06/26/2021 1533   MCH 30.7 06/20/2021 0430   MCHC 33.3 06/26/2021 1533   MCHC 32.1 06/20/2021 0430   RDW 12.0 06/26/2021 1533   LYMPHSABS 0.8 06/26/2021 1533   MONOABS 0.4 06/20/2021 0430   EOSABS 0.0 06/26/2021 1533   BASOSABS 0.0 06/26/2021 1533    BMET    Component Value Date/Time   NA 137 06/26/2021 1533   K 5.0  06/26/2021 1533   CL 102 06/26/2021 1533   CO2 22 06/26/2021 1533   GLUCOSE 119 (H) 06/26/2021 1533   GLUCOSE 104 (H) 06/19/2021 0408   BUN 13 06/26/2021 1533   CREATININE 0.76 06/26/2021 1533   CALCIUM 9.3 06/26/2021 1533   GFRNONAA >60 06/19/2021 0408   GFRAA 103 02/14/2020 1000    BNP    Component Value Date/Time   BNP 37.4 06/20/2021 0430    ProBNP No results found for: PROBNP  Specialty Problems       Pulmonary Problems   Allergic rhinitis    Qualifier: Diagnosis of  By: Cari Caraway RN, Ventura Sellers       Acute bronchitis    Qualifier: Diagnosis of  By: Sarajane Jews MD, Annie Main A       Chronic sinusitis   Acute asthma exacerbation   Bronchitis    Allergies  Allergen Reactions   Seasonal Ic [Cholestatin]    Tape Rash    Immunization History  Administered Date(s) Administered   Influenza Whole 08/22/2009, 09/19/2010   Influenza, High Dose Seasonal PF 08/31/2018, 09/19/2019   Influenza,inj,Quad PF,6+ Mos 10/02/2014   Influenza-Unspecified 08/17/2014   PFIZER(Purple Top)SARS-COV-2 Vaccination 12/25/2019, 01/19/2020   Pneumococcal Conjugate-13 08/31/2018   Td 10/27/2000   Tdap 04/21/2013    Past Medical History:  Diagnosis Date   Allergy    seasonal allergy   Arthritis    left hip   Asthma    Depression    HSV infection    vag    Tobacco History: Social History   Tobacco Use  Smoking Status Never  Smokeless Tobacco Never   Counseling given: Not Answered   Continue to not smoke  Outpatient Encounter Medications as of 09/25/2021  Medication Sig   acyclovir (ZOVIRAX) 200 MG capsule Take 1 capsule (200 mg total) by mouth 5 (five) times daily as needed (breakouts).   albuterol (VENTOLIN HFA) 108 (90 Base) MCG/ACT inhaler Inhale 2 puffs into the lungs every 6 (six) hours as needed for wheezing or shortness of breath.   amoxicillin (AMOXIL) 500 MG tablet SMARTSIG:4 Tablet(s) By Mouth   atorvastatin (LIPITOR) 20 MG tablet TAKE 1 TABLET BY MOUTH  EVERY DAY   cetirizine (ZYRTEC) 10 MG tablet Take 10 mg by mouth daily.   citalopram (CELEXA) 20 MG tablet TAKE 1 AND 1/2 TABLETS BY MOUTH DAILY   fluticasone (FLONASE) 50 MCG/ACT nasal spray Place 1 spray into both nostrils daily.   fluticasone-salmeterol (ADVAIR DISKUS) 250-50 MCG/ACT AEPB Inhale 1 puff into the lungs in the morning and at bedtime.   ipratropium-albuterol (DUONEB) 0.5-2.5 (3) MG/3ML SOLN Take 3 mLs by nebulization every 6 (six) hours as needed (shortness of breath, cough or wheeze).   Nebulizer System All-In-One MISC Use with nebulized albuterol once every 4-6 hours for wheezing and shortness of breath.   Respiratory Therapy Supplies (NEBULIZER MASK ADULT) MISC 1 Inhaler by Does not apply route every 4 (four) hours as needed.   [DISCONTINUED] albuterol (  VENTOLIN HFA) 108 (90 Base) MCG/ACT inhaler Inhale 2 puffs into the lungs every 6 (six) hours as needed for shortness of breath.   [DISCONTINUED] fluticasone-salmeterol (ADVAIR HFA) 230-21 MCG/ACT inhaler Inhale 2 puffs into the lungs 2 (two) times daily.   alendronate (FOSAMAX) 70 MG tablet Take 1 tablet (70 mg total) by mouth once a week. Take with a full glass of water on an empty stomach. (Patient not taking: Reported on 09/25/2021)   No facility-administered encounter medications on file as of 09/25/2021.     Review of Systems  Review of Systems  N/a Physical Exam  BP 122/70 (BP Location: Left Arm, Patient Position: Sitting, Cuff Size: Normal)   Pulse 63   Temp 98.6 F (37 C) (Oral)   Ht 5\' 5"  (1.651 m)   Wt 185 lb 6.4 oz (84.1 kg)   SpO2 99%   BMI 30.85 kg/m   Wt Readings from Last 5 Encounters:  09/25/21 185 lb 6.4 oz (84.1 kg)  06/27/21 168 lb 11.2 oz (76.5 kg)  06/26/21 175 lb 6.4 oz (79.6 kg)  06/20/21 173 lb 1 oz (78.5 kg)  06/12/21 183 lb (83 kg)    BMI Readings from Last 5 Encounters:  09/25/21 30.85 kg/m  06/27/21 28.96 kg/m  06/26/21 28.31 kg/m  06/20/21 28.80 kg/m  06/12/21 30.45 kg/m      Physical Exam General: Well-appearing, no acute distress Eyes: EOMI, no icterus Neck: Supple, no JVP, no thyromegaly, no lymphadenopathy Cardiovascular: Regular rate and rhythm, no murmurs Pulmonary: Normal inspiratory sounds, no wheeze (improved from prior), normal breathing on room air   Assessment & Plan:   Dyspnea on exertion: After acute illness start about 3 weeks ago.  Briefly hospitalized for severe bronchitis.  Possible small pneumonia on chest x-ray on my interpretation although not called by radiology.  Suspect triggered quiescent asthma given ongoing symptoms of wheeze on exam.  She has atopic symptoms.  Dyspnea essentially resolved, now exercising regularly, with high-dose Advair HFA.  De-escalate as below.  Asthma: Atopic symptoms, wheezing, prolonged bronchitis responsive to steroids recently including hospitalization.  Dyspnea improved with high-dose Advair HFA.  De-escalate to mid dose Advair discus.  Hoarseness: Suspect multifactorial but related to ICS.  De-escalate dose as above.  In addition, GERD symptoms.  Resume pantoprazole.  Assess response.  Chest tightness, pressure that is nonexertional, as well as throat fullness: Suspect related to GERD symptoms.  Treat as above.  If no improvement in 2 weeks will obtain CT neck.  No thyromegaly, adenopathy appreciated on exam today.   Return in about 3 months (around 12/26/2021).   Lanier Clam, MD 09/25/2021

## 2021-09-25 NOTE — Patient Instructions (Addendum)
Nice to see you again  I sent a new prescription for Advair diskus mid dose (decreased from current puffer dose) - lets see if the decrease in steroid helps with the throat and voice.  Resume pantoprazole 1 pill before breakfast  If throat tightness is no better in a couple weeks call me and we will order a CT scan  Return to clinic in 3 months or sooner as needed

## 2021-11-05 ENCOUNTER — Encounter: Payer: Self-pay | Admitting: Pulmonary Disease

## 2021-11-05 MED ORDER — PANTOPRAZOLE SODIUM 40 MG PO TBEC: 40.0000 mg | DELAYED_RELEASE_TABLET | Freq: Every day | ORAL | 5 refills | Status: DC

## 2021-11-20 ENCOUNTER — Other Ambulatory Visit: Payer: Self-pay | Admitting: Physician Assistant

## 2021-11-20 DIAGNOSIS — M810 Age-related osteoporosis without current pathological fracture: Secondary | ICD-10-CM

## 2021-12-03 DIAGNOSIS — H35371 Puckering of macula, right eye: Secondary | ICD-10-CM | POA: Diagnosis not present

## 2021-12-03 DIAGNOSIS — H16143 Punctate keratitis, bilateral: Secondary | ICD-10-CM | POA: Diagnosis not present

## 2021-12-26 ENCOUNTER — Other Ambulatory Visit: Payer: Self-pay

## 2021-12-26 ENCOUNTER — Encounter: Payer: Self-pay | Admitting: Pulmonary Disease

## 2021-12-26 ENCOUNTER — Ambulatory Visit (INDEPENDENT_AMBULATORY_CARE_PROVIDER_SITE_OTHER): Payer: Medicare Other | Admitting: Pulmonary Disease

## 2021-12-26 VITALS — BP 130/76 | HR 68 | Temp 98.0°F | Ht 65.0 in | Wt 182.8 lb

## 2021-12-26 DIAGNOSIS — R0609 Other forms of dyspnea: Secondary | ICD-10-CM

## 2021-12-26 DIAGNOSIS — J4520 Mild intermittent asthma, uncomplicated: Secondary | ICD-10-CM | POA: Diagnosis not present

## 2021-12-26 MED ORDER — FLUTICASONE-SALMETEROL 250-50 MCG/ACT IN AEPB: 1.0000 | INHALATION_SPRAY | Freq: Two times a day (BID) | RESPIRATORY_TRACT | 6 refills | Status: DC

## 2021-12-26 NOTE — Patient Instructions (Signed)
Neck to see you again  Continue current Advair, I sent a new prescription for the brand name.  If this can be obtained at a reasonable cost I am fine to continue the brand names of the seems to have been more effective for you in the past.  In next visit, we can consider de-escalating to the lowest dose of Advair if you continue to do well.  Return to clinic in 6 months or sooner as needed with Dr. Silas Flood

## 2021-12-26 NOTE — Progress Notes (Signed)
@Patient  ID: Veronica Barrett, female    DOB: August 27, 1951, 71 y.o.   MRN: [id number]  Chief Complaint  Patient presents with   Follow-up    3 month follow up. Pt states that her asthma is doing great. Hasn't needed her emergency inhaler at all, pt is currently on Advair.     Referring provider: [name], PA-C  HPI:   71 y.o. woman whom we are seeing in follow up for evaluation of dyspnea on exertion.   Overall, DOE remains much improved.  Hoarseness, sensation of, throat tightness improved overall.  Decrease steroid dose via inhaler which may have improved hoarseness.  Instructed her to increase her GERD therapy to help with the throat discomfort although she reports this got better on its own without escalating her therapy.  No changes there in terms of last visit.  Feels like brand-name Advair discus for was more helpful in terms of symptoms overall although admittedly she is not using albuterol really ever, rarely.  Discussed okay to resume brand-name in the future, new prescription today.  Discussed rationale and potential for de-escalation of therapy in the future if still doing well from asthma standpoint.  HPI at initial visit: Patient hospitalized earlier this month with wheezing, bronchitis.  She may treat with outpatient courses of prednisone as well as antibiotics.  Despite this her symptoms of not significant proved.  Mildly worsened.  There is mention of hypoxemia although I do not see any documented true hypoxemia, saturations low 90s on room air in the ED.  She was placed on oxygen supplementation during hospitalization.  She was put on higher dose steroids in the hospital and gradually her breathing and wheezing improved.  She was discharged on steroid taper, Dulera mid dose, Advair mid dose, DuoNebs although she has not used them, and rescue inhaler.  She feels her rescue inhaler use is decreased with time.  Overall her breathing has improved with time.  She took her last dose  of prednisone today.  She continues have dyspnea on exertion although again improving.  Cough is improving as well.  Productive of gray sputum.  No time during day when things are better or worse.  No relieving exacerbating factors other albuterol seems to help some.  Reports being diagnosed with a touch of asthma about 10 years ago.  Endorses seasonal allergies, runny nose, itchy eyes.  Uses Zyrtec daily.  Asthma runs in the family.  Reviewed at length serial chest imaging on my interpretation 06/09/2021 demonstrates small retrocardiac opacity with what appears to be correlate in the base of the left lung along the posterior spine.  Subsequent chest x-ray about a week later 06/2021 with improvement in both retrocardiac opacity and correlate on the lateral film.  Reviewed rib x-ray 06/21/2021 that did not demonstrate rib fracture on review of results.  PMH: Seasonal allergies, asthma Surgical history: Appendectomy, hip replacement Family history: Thyroid cancer in sister, father had other form of cancer, no respiratory illness in first relatives that she can recall Social history: She is a never smoker, lives in [place], near [facility], retired   Licensed conveyancer / Pulmonary Flowsheets:   ACT:  Asthma Control Test ACT Total Score  12/26/2021 24  09/25/2021 23  06/27/2021 9    MMRC: No flowsheet data found.  Epworth:  No flowsheet data found.  Tests:   FENO:  No results found for: NITRICOXIDE  PFT: No flowsheet data found.  WALK:  No flowsheet data found.  Imaging: Personally reviewed as  per EMR discussion in this note No results found.  Lab Results: Personally reviewed, notably eosinophils no higher than 200 in the past CBC    Component Value Date/Time   WBC 10.3 06/26/2021 1533   WBC 8.5 06/20/2021 0430   RBC 4.54 06/26/2021 1533   RBC 4.14 06/20/2021 0430   HGB 13.9 06/26/2021 1533   HCT 41.8 06/26/2021 1533   PLT 331 06/26/2021 1533   MCV 92 06/26/2021 1533   MCH  30.6 06/26/2021 1533   MCH 30.7 06/20/2021 0430   MCHC 33.3 06/26/2021 1533   MCHC 32.1 06/20/2021 0430   RDW 12.0 06/26/2021 1533   LYMPHSABS 0.8 06/26/2021 1533   MONOABS 0.4 06/20/2021 0430   EOSABS 0.0 06/26/2021 1533   BASOSABS 0.0 06/26/2021 1533    BMET    Component Value Date/Time   NA 137 06/26/2021 1533   K 5.0 06/26/2021 1533   CL 102 06/26/2021 1533   CO2 22 06/26/2021 1533   GLUCOSE 119 (H) 06/26/2021 1533   GLUCOSE 104 (H) 06/19/2021 0408   BUN 13 06/26/2021 1533   CREATININE 0.76 06/26/2021 1533   CALCIUM 9.3 06/26/2021 1533   GFRNONAA >60 06/19/2021 0408   GFRAA 103 02/14/2020 1000    BNP    Component Value Date/Time   BNP 37.4 06/20/2021 0430    ProBNP No results found for: PROBNP  Specialty Problems       Pulmonary Problems   Allergic rhinitis    Qualifier: Diagnosis of  By: Cari Caraway RN, Ventura Sellers       Acute bronchitis    Qualifier: Diagnosis of  By: Sarajane Jews MD, Annie Main A       Chronic sinusitis   Acute asthma exacerbation   Bronchitis    Allergies  Allergen Reactions   Seasonal Ic [Cholestatin]    Tape Rash    Immunization History  Administered Date(s) Administered   Influenza Whole 08/22/2009, 09/19/2010   Influenza, High Dose Seasonal PF 08/31/2018, 09/19/2019   Influenza,inj,Quad PF,6+ Mos 10/02/2014   Influenza-Unspecified 08/17/2014   PFIZER(Purple Top)SARS-COV-2 Vaccination 12/25/2019, 01/19/2020   Pneumococcal Conjugate-13 08/31/2018   Td 10/27/2000   Tdap 04/21/2013    Past Medical History:  Diagnosis Date   Allergy    seasonal allergy   Arthritis    left hip   Asthma    Depression    HSV infection    vag    Tobacco History: Social History   Tobacco Use  Smoking Status Never  Smokeless Tobacco Never   Counseling given: Not Answered    Continue to not smoke  Outpatient Encounter Medications as of 12/26/2021  Medication Sig   acyclovir (ZOVIRAX) 200 MG capsule Take 1 capsule (200 mg total) by  mouth 5 (five) times daily as needed (breakouts).   albuterol (VENTOLIN HFA) 108 (90 Base) MCG/ACT inhaler Inhale 2 puffs into the lungs every 6 (six) hours as needed for wheezing or shortness of breath.   alendronate (FOSAMAX) 70 MG tablet TAKE 1 TABLET BY MOUTH ONCE A WEEK. TAKE WITH A FULL GLASS OF WATER ON AN EMPTY STOMACH.   amoxicillin (AMOXIL) 500 MG tablet SMARTSIG:4 Tablet(s) By Mouth   atorvastatin (LIPITOR) 20 MG tablet TAKE 1 TABLET BY MOUTH EVERY DAY   cetirizine (ZYRTEC) 10 MG tablet Take 10 mg by mouth daily.   citalopram (CELEXA) 20 MG tablet TAKE 1 AND 1/2 TABLETS BY MOUTH DAILY   fluticasone (FLONASE) 50 MCG/ACT nasal spray Place 1 spray into both nostrils daily.  ipratropium-albuterol (DUONEB) 0.5-2.5 (3) MG/3ML SOLN Take 3 mLs by nebulization every 6 (six) hours as needed (shortness of breath, cough or wheeze).   Nebulizer System All-In-One MISC Use with nebulized albuterol once every 4-6 hours for wheezing and shortness of breath.   pantoprazole (PROTONIX) 40 MG tablet Take 1 tablet (40 mg total) by mouth daily.   Respiratory Therapy Supplies (NEBULIZER MASK ADULT) MISC 1 Inhaler by Does not apply route every 4 (four) hours as needed.   [DISCONTINUED] fluticasone-salmeterol (ADVAIR DISKUS) 250-50 MCG/ACT AEPB Inhale 1 puff into the lungs in the morning and at bedtime.   fluticasone-salmeterol (ADVAIR DISKUS) 250-50 MCG/ACT AEPB Inhale 1 puff into the lungs in the morning and at bedtime.   No facility-administered encounter medications on file as of 12/26/2021.     Review of Systems  Review of Systems  N/a Physical Exam  BP 130/76 (BP Location: Left Arm, Patient Position: Sitting, Cuff Size: Normal)    Pulse 68    Temp 98 F (36.7 C) (Oral)    Ht 5\' 5"  (1.651 m)    Wt 182 lb 12.8 oz (82.9 kg)    SpO2 99%    BMI 30.42 kg/m   Wt Readings from Last 5 Encounters:  12/26/21 182 lb 12.8 oz (82.9 kg)  09/25/21 185 lb 6.4 oz (84.1 kg)  06/27/21 168 lb 11.2 oz (76.5 kg)   06/26/21 175 lb 6.4 oz (79.6 kg)  06/20/21 173 lb 1 oz (78.5 kg)    BMI Readings from Last 5 Encounters:  12/26/21 30.42 kg/m  09/25/21 30.85 kg/m  06/27/21 28.96 kg/m  06/26/21 28.31 kg/m  06/20/21 28.80 kg/m     Physical Exam General: Well-appearing, no acute distress Eyes: EOMI, no icterus Neck: Supple, no JVP Cardiovascular: Regular rate and rhythm, no murmurs Pulmonary: Clear, normal breathing on room air   Assessment & Plan:   Dyspnea on exertion:  Briefly hospitalized for severe bronchitis.  Possible small pneumonia on chest x-ray on my interpretation although not called by radiology.  Suspect triggered quiescent asthma given ongoing symptoms of wheeze on exam.  She has atopic symptoms.  Dyspnea essentially resolved, exercising regularly, De-escalated high-dose Advair HFA to mid dose diskus 11/22.  Symptoms remain controlled  Asthma: Atopic symptoms, wheezing, prolonged bronchitis responsive to steroids including hospitalization 06/2021.  Dyspnea improved with high-dose Advair HFA.  De-escalated to mid dose Advair diskus. Refill for brand name sent as she felt that was more effective than generic. Consider deescalating to low dose in 6 months if stable.   Return in about 6 months (around 06/25/2022).   Lanier Clam, MD 12/26/2021

## 2021-12-29 ENCOUNTER — Other Ambulatory Visit: Payer: Self-pay | Admitting: Physician Assistant

## 2021-12-29 DIAGNOSIS — F339 Major depressive disorder, recurrent, unspecified: Secondary | ICD-10-CM

## 2022-01-08 ENCOUNTER — Other Ambulatory Visit: Payer: Self-pay | Admitting: Physician Assistant

## 2022-01-08 DIAGNOSIS — E785 Hyperlipidemia, unspecified: Secondary | ICD-10-CM

## 2022-05-12 ENCOUNTER — Other Ambulatory Visit: Payer: Self-pay | Admitting: Physician Assistant

## 2022-05-12 DIAGNOSIS — M810 Age-related osteoporosis without current pathological fracture: Secondary | ICD-10-CM

## 2022-05-27 DIAGNOSIS — H35371 Puckering of macula, right eye: Secondary | ICD-10-CM | POA: Diagnosis not present

## 2022-07-05 ENCOUNTER — Other Ambulatory Visit: Payer: Self-pay | Admitting: Physician Assistant

## 2022-07-05 DIAGNOSIS — F339 Major depressive disorder, recurrent, unspecified: Secondary | ICD-10-CM

## 2022-07-13 ENCOUNTER — Other Ambulatory Visit: Payer: Self-pay | Admitting: Physician Assistant

## 2022-07-13 DIAGNOSIS — E785 Hyperlipidemia, unspecified: Secondary | ICD-10-CM

## 2022-08-28 DIAGNOSIS — Z1231 Encounter for screening mammogram for malignant neoplasm of breast: Secondary | ICD-10-CM | POA: Diagnosis not present

## 2022-08-28 DIAGNOSIS — Z01419 Encounter for gynecological examination (general) (routine) without abnormal findings: Secondary | ICD-10-CM | POA: Diagnosis not present

## 2022-08-28 DIAGNOSIS — Z124 Encounter for screening for malignant neoplasm of cervix: Secondary | ICD-10-CM | POA: Diagnosis not present

## 2022-08-28 DIAGNOSIS — Z6826 Body mass index (BMI) 26.0-26.9, adult: Secondary | ICD-10-CM | POA: Diagnosis not present

## 2022-08-28 LAB — HM PAP SMEAR: HM Pap smear: NEGATIVE

## 2022-08-28 LAB — HM MAMMOGRAPHY

## 2022-09-10 DIAGNOSIS — Z23 Encounter for immunization: Secondary | ICD-10-CM | POA: Diagnosis not present

## 2022-09-17 ENCOUNTER — Encounter: Payer: Self-pay | Admitting: Physician Assistant

## 2022-10-03 ENCOUNTER — Other Ambulatory Visit: Payer: Self-pay | Admitting: Physician Assistant

## 2022-10-03 DIAGNOSIS — F339 Major depressive disorder, recurrent, unspecified: Secondary | ICD-10-CM

## 2022-10-03 NOTE — Telephone Encounter (Signed)
Needs an office visit.

## 2022-11-03 ENCOUNTER — Other Ambulatory Visit: Payer: Self-pay | Admitting: Nurse Practitioner

## 2022-11-03 DIAGNOSIS — F339 Major depressive disorder, recurrent, unspecified: Secondary | ICD-10-CM

## 2022-11-04 ENCOUNTER — Telehealth: Payer: Self-pay | Admitting: *Deleted

## 2022-11-04 DIAGNOSIS — E785 Hyperlipidemia, unspecified: Secondary | ICD-10-CM

## 2022-11-04 DIAGNOSIS — F339 Major depressive disorder, recurrent, unspecified: Secondary | ICD-10-CM

## 2022-11-04 MED ORDER — ATORVASTATIN CALCIUM 20 MG PO TABS: 20.0000 mg | ORAL_TABLET | Freq: Every day | ORAL | 0 refills | Status: DC

## 2022-11-04 MED ORDER — CITALOPRAM HYDROBROMIDE 20 MG PO TABS: ORAL_TABLET | ORAL | 0 refills | Status: DC

## 2022-11-04 NOTE — Telephone Encounter (Signed)
Pt called yesterday requesting refill on some medications, she said they were denied.  Informed her that it has been over a year since she was seen in office and we scheduled her an appointment on 12/10/2022 due to full schedule.  She is requesting that a refill be sent in to get her to her appointment if possible.  Please advise and let patient know outcome. Medication requested listed below.  Veronica Barrett, CMA   citalopram (CELEXA) 20 MG tablet   atorvastatin (LIPITOR) 20 MG tablet   Pharmacy: [facility]

## 2022-11-04 NOTE — Telephone Encounter (Signed)
30 day supply has been sent to [facility]

## 2022-11-13 ENCOUNTER — Telehealth: Payer: Self-pay

## 2022-11-13 DIAGNOSIS — B009 Herpesviral infection, unspecified: Secondary | ICD-10-CM

## 2022-11-13 MED ORDER — ACYCLOVIR 200 MG PO CAPS: 200.0000 mg | ORAL_CAPSULE | Freq: Every day | ORAL | 0 refills | Status: DC | PRN

## 2022-11-13 NOTE — Telephone Encounter (Signed)
Refill has been sent.  °

## 2022-11-13 NOTE — Telephone Encounter (Signed)
Pt is requesting a refill on: acyclovir (ZOVIRAX) 200 MG capsule   Pharmacy: [facility], [place] 06/26/21 ROV 12/10/22  Pt is requesting enough to get her to the appointment on 12/10/22. The original provider was Dr. Sherren Mocha whom the pt no longer see when she switched to [facility].

## 2022-12-10 ENCOUNTER — Encounter: Payer: Self-pay | Admitting: Nurse Practitioner

## 2022-12-10 ENCOUNTER — Ambulatory Visit (INDEPENDENT_AMBULATORY_CARE_PROVIDER_SITE_OTHER): Payer: Medicare Other | Admitting: Nurse Practitioner

## 2022-12-10 VITALS — BP 124/75 | HR 60 | Ht 65.0 in | Wt 167.8 lb

## 2022-12-10 DIAGNOSIS — E785 Hyperlipidemia, unspecified: Secondary | ICD-10-CM

## 2022-12-10 DIAGNOSIS — J45909 Unspecified asthma, uncomplicated: Secondary | ICD-10-CM

## 2022-12-10 DIAGNOSIS — F339 Major depressive disorder, recurrent, unspecified: Secondary | ICD-10-CM

## 2022-12-10 MED ORDER — ATORVASTATIN CALCIUM 20 MG PO TABS: 20.0000 mg | ORAL_TABLET | Freq: Every day | ORAL | 1 refills | Status: DC

## 2022-12-10 NOTE — Progress Notes (Signed)
Established patient visit   Patient: Veronica Barrett   DOB: [birth year]   72 y.o. Female  MRN: [id number] Visit Date: 12/10/2022   Chief Complaint  Patient presents with   Medication Refill   Subjective    HPI  Follow up -medication refills  -taking only one celexa now.  -feels as though she is doing well with this medication.  -she is due for medicare wellness visit -due for routine, fasting labs.  -no concerns or complaints today -She denies chest pain, chest pressure, or shortness of breath. She denies headaches or visual disturbances. She denies abdominal pain, nausea, vomiting, or changes in bowel or bladder habits.    Medications: Outpatient Medications Prior to Visit  Medication Sig   acyclovir (ZOVIRAX) 200 MG capsule Take 1 capsule (200 mg total) by mouth 5 (five) times daily as needed (breakouts).   albuterol (VENTOLIN HFA) 108 (90 Base) MCG/ACT inhaler Inhale 2 puffs into the lungs every 6 (six) hours as needed for wheezing or shortness of breath.   alendronate (FOSAMAX) 70 MG tablet TAKE 1 TABLET BY MOUTH ONCE A WEEK. TAKE WITH A FULL GLASS OF WATER ON AN EMPTY STOMACH.   amoxicillin (AMOXIL) 500 MG tablet SMARTSIG:4 Tablet(s) By Mouth   cetirizine (ZYRTEC) 10 MG tablet Take 10 mg by mouth daily.   citalopram (CELEXA) 20 MG tablet TAKE 1 AND 1/2 TABLETS DAILY BY MOUTH   fluticasone (FLONASE) 50 MCG/ACT nasal spray Place 1 spray into both nostrils daily.   fluticasone-salmeterol (ADVAIR DISKUS) 250-50 MCG/ACT AEPB Inhale 1 puff into the lungs in the morning and at bedtime.   ipratropium-albuterol (DUONEB) 0.5-2.5 (3) MG/3ML SOLN Take 3 mLs by nebulization every 6 (six) hours as needed (shortness of breath, cough or wheeze).   Nebulizer System All-In-One MISC Use with nebulized albuterol once every 4-6 hours for wheezing and shortness of breath.   pantoprazole (PROTONIX) 40 MG tablet Take 1 tablet (40 mg total) by mouth daily.   Respiratory Therapy Supplies (NEBULIZER MASK  ADULT) MISC 1 Inhaler by Does not apply route every 4 (four) hours as needed.   [DISCONTINUED] atorvastatin (LIPITOR) 20 MG tablet Take 1 tablet (20 mg total) by mouth daily.   No facility-administered medications prior to visit.    Review of Systems See HPI    Objective     Today's Vitals   12/10/22 1532  BP: 124/75  Pulse: 60  SpO2: 96%  Weight: 167 lb 12.8 oz (76.1 kg)  Height: '5\' 5"'$  (1.651 m)   Body mass index is 27.92 kg/m.  BP Readings from Last 3 Encounters:  12/10/22 124/75  12/26/21 130/76  09/25/21 122/70    Wt Readings from Last 3 Encounters:  12/10/22 167 lb 12.8 oz (76.1 kg)  12/26/21 182 lb 12.8 oz (82.9 kg)  09/25/21 185 lb 6.4 oz (84.1 kg)    Physical Exam Vitals and nursing note reviewed.  Constitutional:      Appearance: Normal appearance. She is well-developed.  HENT:     Head: Normocephalic and atraumatic.     Nose: Nose normal.  Eyes:     Extraocular Movements: Extraocular movements intact.     Conjunctiva/sclera: Conjunctivae normal.     Pupils: Pupils are equal, round, and reactive to light.  Cardiovascular:     Rate and Rhythm: Normal rate and regular rhythm.     Pulses: Normal pulses.     Heart sounds: Normal heart sounds.  Pulmonary:     Effort: Pulmonary effort is normal.  Breath sounds: Normal breath sounds.  Abdominal:     Palpations: Abdomen is soft.  Musculoskeletal:        General: Normal range of motion.     Cervical back: Normal range of motion and neck supple.  Lymphadenopathy:     Cervical: No cervical adenopathy.  Skin:    General: Skin is warm and dry.     Capillary Refill: Capillary refill takes less than 2 seconds.  Neurological:     General: No focal deficit present.     Mental Status: She is alert and oriented to person, place, and time.  Psychiatric:        Mood and Affect: Mood normal.        Behavior: Behavior normal.        Thought Content: Thought content normal.        Judgment: Judgment normal.       Assessment & Plan    1. Hyperlipidemia, unspecified hyperlipidemia type Check fasting lipids prior to next  visit  and adjust atorvastatin as indicated.  - atorvastatin (LIPITOR) 20 MG tablet; Take 1 tablet (20 mg total) by mouth daily.  Dispense: 90 tablet; Refill: 1  2. Depression, recurrent ([place]) Stable. Continue celexa as prescribed.   3. Asthma without acute exacerbation Continue inhalers and respiratory medications as prescribed     Problem List Items Addressed This Visit       Respiratory   Acute asthma exacerbation     Other   Depression, recurrent ([place])   Hyperlipidemia - Primary   Relevant Medications   atorvastatin (LIPITOR) 20 MG tablet     Return in about 3 months (around 03/11/2023) for medicare wellness, FBW a week prior to visit - can stay with me.         Ronnell Freshwater, NP  [facility] at [facility] [phone] (phone) [phone] (fax)  [place]

## 2022-12-29 DIAGNOSIS — H2513 Age-related nuclear cataract, bilateral: Secondary | ICD-10-CM | POA: Diagnosis not present

## 2022-12-29 DIAGNOSIS — H35371 Puckering of macula, right eye: Secondary | ICD-10-CM | POA: Diagnosis not present

## 2022-12-29 DIAGNOSIS — H25013 Cortical age-related cataract, bilateral: Secondary | ICD-10-CM | POA: Diagnosis not present

## 2023-01-10 ENCOUNTER — Other Ambulatory Visit: Payer: Self-pay | Admitting: Nurse Practitioner

## 2023-01-10 DIAGNOSIS — F339 Major depressive disorder, recurrent, unspecified: Secondary | ICD-10-CM

## 2023-01-27 ENCOUNTER — Other Ambulatory Visit: Payer: Self-pay | Admitting: Nurse Practitioner

## 2023-01-27 DIAGNOSIS — F339 Major depressive disorder, recurrent, unspecified: Secondary | ICD-10-CM

## 2023-02-20 ENCOUNTER — Other Ambulatory Visit: Payer: Self-pay | Admitting: Nurse Practitioner

## 2023-02-20 DIAGNOSIS — E785 Hyperlipidemia, unspecified: Secondary | ICD-10-CM

## 2023-02-20 DIAGNOSIS — Z0000 Encounter for general adult medical examination without abnormal findings: Secondary | ICD-10-CM

## 2023-02-20 DIAGNOSIS — D72829 Elevated white blood cell count, unspecified: Secondary | ICD-10-CM

## 2023-03-10 ENCOUNTER — Other Ambulatory Visit: Payer: Medicare Other

## 2023-03-10 DIAGNOSIS — E785 Hyperlipidemia, unspecified: Secondary | ICD-10-CM | POA: Diagnosis not present

## 2023-03-10 DIAGNOSIS — D72829 Elevated white blood cell count, unspecified: Secondary | ICD-10-CM | POA: Diagnosis not present

## 2023-03-10 DIAGNOSIS — Z0000 Encounter for general adult medical examination without abnormal findings: Secondary | ICD-10-CM

## 2023-03-11 LAB — COMPREHENSIVE METABOLIC PANEL
ALT: 18 IU/L (ref 0–32)
AST: 18 IU/L (ref 0–40)
Albumin/Globulin Ratio: 2.5 — ABNORMAL HIGH (ref 1.2–2.2)
Albumin: 4.7 g/dL (ref 3.8–4.8)
Alkaline Phosphatase: 91 IU/L (ref 44–121)
BUN/Creatinine Ratio: 19 (ref 12–28)
BUN: 14 mg/dL (ref 8–27)
Bilirubin Total: 0.4 mg/dL (ref 0.0–1.2)
CO2: 23 mmol/L (ref 20–29)
Calcium: 9.4 mg/dL (ref 8.7–10.3)
Chloride: 105 mmol/L (ref 96–106)
Creatinine, Ser: 0.72 mg/dL (ref 0.57–1.00)
Globulin, Total: 1.9 g/dL (ref 1.5–4.5)
Glucose: 89 mg/dL (ref 70–99)
Potassium: 5.2 mmol/L (ref 3.5–5.2)
Sodium: 141 mmol/L (ref 134–144)
Total Protein: 6.6 g/dL (ref 6.0–8.5)
eGFR: 89 mL/min/{1.73_m2} (ref >59)

## 2023-03-11 LAB — CBC WITH DIFFERENTIAL/PLATELET
Basophils Absolute: 0 10*3/uL (ref 0.0–0.2)
Basos: 0 %
EOS (ABSOLUTE): 0.3 10*3/uL (ref 0.0–0.4)
Eos: 6 %
Hematocrit: 39.6 % (ref 34.0–46.6)
Hemoglobin: 13.1 g/dL (ref 11.1–15.9)
Immature Grans (Abs): 0 10*3/uL (ref 0.0–0.1)
Immature Granulocytes: 0 %
Lymphocytes Absolute: 1.5 10*3/uL (ref 0.7–3.1)
Lymphs: 29 %
MCH: 31.5 pg (ref 26.6–33.0)
MCHC: 33.1 g/dL (ref 31.5–35.7)
MCV: 95 fL (ref 79–97)
Monocytes Absolute: 0.5 10*3/uL (ref 0.1–0.9)
Monocytes: 10 %
Neutrophils Absolute: 2.8 10*3/uL (ref 1.4–7.0)
Neutrophils: 55 %
Platelets: 273 10*3/uL (ref 150–450)
RBC: 4.16 x10E6/uL (ref 3.77–5.28)
RDW: 12.1 % (ref 11.7–15.4)
WBC: 5.2 10*3/uL (ref 3.4–10.8)

## 2023-03-11 LAB — LIPID PANEL
Chol/HDL Ratio: 2.5 ratio (ref 0.0–4.4)
Cholesterol, Total: 150 mg/dL (ref 100–199)
HDL: 61 mg/dL (ref >39)
LDL Chol Calc (NIH): 75 mg/dL (ref 0–99)
Triglycerides: 68 mg/dL (ref 0–149)
VLDL Cholesterol Cal: 14 mg/dL (ref 5–40)

## 2023-03-11 LAB — TSH: TSH: 2.99 u[IU]/mL (ref 0.450–4.500)

## 2023-03-17 ENCOUNTER — Telehealth: Payer: Self-pay

## 2023-03-17 ENCOUNTER — Encounter: Payer: Medicare Other | Admitting: Nurse Practitioner

## 2023-03-17 NOTE — Telephone Encounter (Signed)
Pt is requesting to go over the results of bloodwork especially Albumin/Globulin Ratio out of range.  Please advise

## 2023-03-17 NOTE — Telephone Encounter (Signed)
Please let her know that this is the only lab that is only slightly abnormal. Both the albumin and globulin levels are normal. These are two different proteins. With all other labs being normal, this is not something that is concerning. It is better than last year. We will continue to monitor to this yearly/ all other labs were normal.  Thanks so much.   -HB

## 2023-03-17 NOTE — Telephone Encounter (Signed)
Called pt she is advised of her lab results and recommendation  

## 2023-03-26 ENCOUNTER — Ambulatory Visit (INDEPENDENT_AMBULATORY_CARE_PROVIDER_SITE_OTHER): Payer: Medicare Other

## 2023-03-26 VITALS — Ht 65.0 in | Wt 162.0 lb

## 2023-03-26 DIAGNOSIS — Z0000 Encounter for general adult medical examination without abnormal findings: Secondary | ICD-10-CM | POA: Diagnosis not present

## 2023-03-26 DIAGNOSIS — Z1159 Encounter for screening for other viral diseases: Secondary | ICD-10-CM

## 2023-03-26 NOTE — Addendum Note (Signed)
Addended by: Saralyn Pilar on: 03/26/2023 04:00 PM   Modules accepted: Orders

## 2023-03-26 NOTE — Progress Notes (Signed)
Subjective:   Veronica Barrett is a 72 y.o. female who presents for Medicare Annual (Subsequent) preventive examination.  Review of Systems  Virtual Visit via Telephone Note  I connected with  [name] on 03/26/23 at  3:00 PM EDT by telephone and verified that I am speaking with the correct person using two identifiers.  Location: Patient: Home Provider: Office Persons participating in the virtual visit: patient/Nurse Health Advisor   I discussed the limitations, risks, security and privacy concerns of performing an evaluation and management service by telephone and the availability of in person appointments. The patient expressed understanding and agreed to proceed.  Interactive audio and video telecommunications were attempted between this nurse and patient, however failed, due to patient having technical difficulties OR patient did not have access to video capability.  We continued and completed visit with audio only.  Some vital signs may be absent or patient reported.   [name], LPN  Cardiac Risk Factors include: advanced age (>75men, >14 women)     Objective:    Today's Vitals   03/26/23 1517  Weight: 162 lb (73.5 kg)  Height: 5\' 5"  (1.651 m)   Body mass index is 26.96 kg/m.     03/26/2023    3:28 PM 06/17/2021   11:26 AM 10/17/2014    6:00 PM 10/17/2014   12:16 PM 10/10/2014   11:05 AM  Advanced Directives  Does Patient Have a Medical Advance Directive? No No No No No  Would patient like information on creating a medical advance directive? No - Patient declined No - Patient declined No - patient declined information No - patient declined information No - patient declined information    Current Medications (verified) Outpatient Encounter Medications as of 03/26/2023  Medication Sig   acyclovir (ZOVIRAX) 200 MG capsule Take 1 capsule (200 mg total) by mouth 5 (five) times daily as needed (breakouts).   albuterol (VENTOLIN HFA) 108 (90 Base) MCG/ACT inhaler  Inhale 2 puffs into the lungs every 6 (six) hours as needed for wheezing or shortness of breath.   alendronate (FOSAMAX) 70 MG tablet TAKE 1 TABLET BY MOUTH ONCE A WEEK. TAKE WITH A FULL GLASS OF WATER ON AN EMPTY STOMACH.   amoxicillin (AMOXIL) 500 MG tablet SMARTSIG:4 Tablet(s) By Mouth   atorvastatin (LIPITOR) 20 MG tablet Take 1 tablet (20 mg total) by mouth daily.   cetirizine (ZYRTEC) 10 MG tablet Take 10 mg by mouth daily.   citalopram (CELEXA) 20 MG tablet TAKE 1 AND 1/2 TABLETS BY MOUTH EVERY DAY   fluticasone (FLONASE) 50 MCG/ACT nasal spray Place 1 spray into both nostrils daily.   fluticasone-salmeterol (ADVAIR DISKUS) 250-50 MCG/ACT AEPB Inhale 1 puff into the lungs in the morning and at bedtime. (Patient not taking: Reported on 03/26/2023)   ipratropium-albuterol (DUONEB) 0.5-2.5 (3) MG/3ML SOLN Take 3 mLs by nebulization every 6 (six) hours as needed (shortness of breath, cough or wheeze).   Nebulizer System All-In-One MISC Use with nebulized albuterol once every 4-6 hours for wheezing and shortness of breath.   pantoprazole (PROTONIX) 40 MG tablet Take 1 tablet (40 mg total) by mouth daily.   Respiratory Therapy Supplies (NEBULIZER MASK ADULT) MISC 1 Inhaler by Does not apply route every 4 (four) hours as needed.   No facility-administered encounter medications on file as of 03/26/2023.    Allergies (verified) Seasonal ic [cholestatin] and Tape   History: Past Medical History:  Diagnosis Date   Allergy    seasonal allergy  Arthritis    left hip   Asthma    Depression    HSV infection    vag   Past Surgical History:  Procedure Laterality Date   APPENDECTOMY  1984   EYE SURGERY Left    left vitreous   TOTAL HIP ARTHROPLASTY Left 10/17/2014   Procedure: LEFT TOTAL HIP ARTHROPLASTY ANTERIOR APPROACH;  Surgeon: Shelda Pal, MD;  Location: [facility];  Service: Orthopedics;  Laterality: Left;   Family History  Problem Relation Age of Onset   Cancer Father    Cancer  Sister    Thyroid cancer Sister    Arthritis Other    Colon cancer Neg Hx    Social History   Socioeconomic History   Marital status: Married    Spouse name: Not on file   Number of children: Not on file   Years of education: Not on file   Highest education level: Not on file  Occupational History   Not on file  Tobacco Use   Smoking status: Never   Smokeless tobacco: Never  Vaping Use   Vaping Use: Never used  Substance and Sexual Activity   Alcohol use: Yes    Alcohol/week: 10.0 standard drinks of alcohol    Types: 10 Glasses of wine per week   Drug use: No   Sexual activity: Not Currently  Other Topics Concern   Not on file  Social History Narrative   Not on file   Social Determinants of Health   Financial Resource Strain: Low Risk  (03/26/2023)   Overall Financial Resource Strain (CARDIA)    Difficulty of Paying Living Expenses: Not hard at all  Food Insecurity: No Food Insecurity (03/26/2023)   Hunger Vital Sign    Worried About Running Out of Food in the Last Year: Never true    Ran Out of Food in the Last Year: Never true  Transportation Needs: No Transportation Needs (03/26/2023)   PRAPARE - Administrator, Civil Service (Medical): No    Lack of Transportation (Non-Medical): No  Physical Activity: Insufficiently Active (03/26/2023)   Exercise Vital Sign    Days of Exercise per Week: 3 days    Minutes of Exercise per Session: 30 min  Stress: No Stress Concern Present (03/26/2023)   [employer] of Occupational Health - Occupational Stress Questionnaire    Feeling of Stress : Not at all  Social Connections: Moderately Integrated (03/26/2023)   Social Connection and Isolation Panel [NHANES]    Frequency of Communication with Friends and Family: More than three times a week    Frequency of Social Gatherings with Friends and Family: More than three times a week    Attends Religious Services: More than 4 times per year    Active Member of [employer] or  Organizations: Yes    Attends Banker Meetings: More than 4 times per year    Marital Status: Widowed    Tobacco Counseling Counseling given: Not Answered   Clinical Intake:  Pre-visit preparation completed: Yes  Pain : No/denies pain     BMI - recorded: 26.96 Nutritional Status: BMI 25 -29 Overweight Nutritional Risks: None Diabetes: No  How often do you need to have someone help you when you read instructions, pamphlets, or other written materials from your doctor or pharmacy?: 1 - Never  Diabetic?  No  Interpreter Needed?: No  Information entered by :: Helyn App LPN   Activities of Daily Living    03/26/2023  3:24 PM  In your present state of health, do you have any difficulty performing the following activities:  Hearing? 0  Vision? 0  Difficulty concentrating or making decisions? 0  Walking or climbing stairs? 0  Dressing or bathing? 0  Doing errands, shopping? 0  Preparing Food and eating ? N  Using the Toilet? N  In the past six months, have you accidently leaked urine? N  Do you have problems with loss of bowel control? N  Managing your Medications? N  Managing your Finances? N  Housekeeping or managing your Housekeeping? N    Patient Care Team: Carlean Jews, NP as PCP - General (Family Medicine) Durene Romans, MD as Consulting Physician (Orthopedic Surgery) Gelene Mink, OD as Referring Physician (Optometry) Ob/Gyn, [facility] any recent Medical Services you may have received from other than [facility] providers in the past year (date may be approximate).     Assessment:   This is a routine wellness examination for Ceaira.  Hearing/Vision screen Hearing Screening - Comments:: Denies hearing difficulties   Vision Screening - Comments:: Wears rx glasses - up to date with routine eye exams with  Dr Sharlot Gowda  Dietary issues and exercise activities discussed: Exercise limited by: None identified   Goals Addressed                This Visit's Progress     Increase physical activity (pt-stated)        Lose another 10 lbs.       Depression Screen    03/26/2023    3:24 PM 12/10/2022    3:36 PM 06/12/2021   10:15 AM 06/05/2021    2:11 PM 04/12/2021    8:30 AM 12/04/2020   10:43 AM 02/14/2020   12:49 PM  PHQ 2/9 Scores  PHQ - 2 Score 0 0 0 0 0 0 1  PHQ- 9 Score  0 0 0 1 0 1    Fall Risk    03/26/2023    3:26 PM 12/10/2022    3:36 PM 06/12/2021   10:14 AM 06/05/2021    2:11 PM 04/12/2021    8:30 AM  Fall Risk   Falls in the past year? 0 0 0 0 0  Number falls in past yr: 0 0 0 0 0  Injury with Fall? 0 0 0 0 0  Risk for fall due to : No Fall Risks  No Fall Risks No Fall Risks   Follow up Falls prevention discussed Falls evaluation completed Falls evaluation completed Falls evaluation completed Falls evaluation completed    FALL RISK PREVENTION PERTAINING TO THE HOME:  Any stairs in or around the home? Yes  If so, are there any without handrails? No  Home free of loose throw rugs in walkways, pet beds, electrical cords, etc? Yes  Adequate lighting in your home to reduce risk of falls? Yes   ASSISTIVE DEVICES UTILIZED TO PREVENT FALLS:  Life alert? No  Use of a cane, walker or w/c? No  Grab bars in the bathroom? Yes Shower chair or bench in shower? No  Elevated toilet seat or a handicapped toilet? No   TIMED UP AND GO:  Was the test performed? No . Audio Visit   Cognitive Function:        03/26/2023    3:28 PM 04/12/2021    8:31 AM 02/14/2020   12:46 PM  6CIT Screen  What Year? 0 points 0 points 0 points  What month? 0 points  0 points  What time? 0 points 0 points 0 points  Count back from 20 0 points 0 points 0 points  Months in reverse 0 points 0 points 0 points  Repeat phrase 0 points 0 points 0 points  Total Score 0 points  0 points    Immunizations Immunization History  Administered Date(s) Administered   Influenza Whole 08/22/2009, 09/19/2010   Influenza, High Dose  Seasonal PF 08/31/2018, 09/19/2019, 09/10/2022   Influenza,inj,Quad PF,6+ Mos 10/02/2014   Influenza-Unspecified 08/17/2014   PFIZER(Purple Top)SARS-COV-2 Vaccination 12/25/2019, 01/19/2020   Pneumococcal Conjugate-13 08/31/2018   Td 10/27/2000   Tdap 04/21/2013    TDAP status: Up to date  Flu Vaccine status: Up to date  Pneumococcal vaccine status: Due, Education has been provided regarding the importance of this vaccine. Advised may receive this vaccine at local pharmacy or Health Dept. Aware to provide a copy of the vaccination record if obtained from local pharmacy or Health Dept. Verbalized acceptance and understanding.  Covid-19 vaccine status: Completed vaccines  Qualifies for Shingles Vaccine? Yes   Zostavax completed No   Shingrix Completed?: No.    Education has been provided regarding the importance of this vaccine. Patient has been advised to call insurance company to determine out of pocket expense if they have not yet received this vaccine. Advised may also receive vaccine at local pharmacy or Health Dept. Verbalized acceptance and understanding.  Screening Tests Health Maintenance  Topic Date Due   COVID-19 Vaccine (3 - 2023-24 season) 04/11/2023 (Originally 07/18/2022)   Zoster Vaccines- Shingrix (1 of 2) 06/26/2023 (Originally 12/26/2001)   Pneumonia Vaccine 15+ Years old (2 of 2 - PPSV23 or PCV20) 03/25/2024 (Originally 09/01/2019)   Hepatitis C Screening  03/25/2024 (Originally 12/26/1968)   DTaP/Tdap/Td (3 - Td or Tdap) 04/22/2023   COLONOSCOPY (Pts 45-73yrs Insurance coverage will need to be confirmed)  06/16/2023   INFLUENZA VACCINE  06/18/2023   Medicare Annual Wellness (AWV)  03/25/2024   MAMMOGRAM  08/28/2024   DEXA SCAN  Completed   HPV VACCINES  Aged Out    Health Maintenance  There are no preventive care reminders to display for this patient.   Colorectal cancer screening: Type of screening: Colonoscopy. Completed 06/15/13. Repeat every 10  years  Mammogram status: Completed 08/28/22. Repeat every year  Bone Density status: Completed 06/04/21. Results reflect: Bone density results: OSTEOPOROSIS. Repeat every   years.  Lung Cancer Screening: (Low Dose CT Chest recommended if Age 56-80 years, 30 pack-year currently smoking OR have quit w/in 15years.) does not qualify.     Additional Screening:  Hepatitis C Screening: does qualify;  Deferred  Vision Screening: Recommended annual ophthalmology exams for early detection of glaucoma and other disorders of the eye. Is the patient up to date with their annual eye exam?  Yes  Who is the provider or what is the name of the office in which the patient attends annual eye exams? Dr Sharlot Gowda If pt is not established with a provider, would they like to be referred to a provider to establish care? No .   Dental Screening: Recommended annual dental exams for proper oral hygiene  Community Resource Referral / Chronic Care Management:  CRR required this visit?  No   CCM required this visit?  No      Plan:     I have personally reviewed and noted the following in the patient's chart:   Medical and social history Use of alcohol, tobacco or illicit drugs  Current medications and supplements  including opioid prescriptions. Patient is not currently taking opioid prescriptions. Functional ability and status Nutritional status Physical activity Advanced directives List of other physicians Hospitalizations, surgeries, and ER visits in previous 12 months Vitals Screenings to include cognitive, depression, and falls Referrals and appointments  In addition, I have reviewed and discussed with patient certain preventive protocols, quality metrics, and best practice recommendations. A written personalized care plan for preventive services as well as general preventive health recommendations were provided to patient.     [name], LPN   [id number]/[id number]   Nurse Notes:  Patient due Hep-C  Screening

## 2023-03-26 NOTE — Patient Instructions (Addendum)
Veronica Barrett , Thank you for taking time to come for your Medicare Wellness Visit. I appreciate your ongoing commitment to your health goals. Please review the following plan we discussed and let me know if I can assist you in the future.   These are the goals we discussed:  Goals       Increase physical activity (pt-stated)      Lose another 10 lbs.        This is a list of the screening recommended for you and due dates:  Health Maintenance  Topic Date Due   COVID-19 Vaccine (3 - 2023-24 season) 04/11/2023*   Zoster (Shingles) Vaccine (1 of 2) 06/26/2023*   Pneumonia Vaccine (2 of 2 - PPSV23 or PCV20) 03/25/2024*   Hepatitis C Screening: USPSTF Recommendation to screen - Ages 18-79 yo.  03/25/2024*   DTaP/Tdap/Td vaccine (3 - Td or Tdap) 04/22/2023   Colon Cancer Screening  06/16/2023   Flu Shot  06/18/2023   Medicare Annual Wellness Visit  03/25/2024   Mammogram  08/28/2024   DEXA scan (bone density measurement)  Completed   HPV Vaccine  Aged Out  *Topic was postponed. The date shown is not the original due date.    Advanced directives: Advance directive discussed with you today. Even though you declined this today, please call our office should you change your mind, and we can give you the proper paperwork for you to fill out.   Conditions/risks identified: None  Next appointment: Follow up in one year for your annual wellness visit    Preventive Care 65 Years and Older, Female Preventive care refers to lifestyle choices and visits with your health care provider that can promote health and wellness. What does preventive care include? A yearly physical exam. This is also called an annual well check. Dental exams once or twice a year. Routine eye exams. Ask your health care provider how often you should have your eyes checked. Personal lifestyle choices, including: Daily care of your teeth and gums. Regular physical activity. Eating a healthy diet. Avoiding tobacco and  drug use. Limiting alcohol use. Practicing safe sex. Taking low-dose aspirin every day. Taking vitamin and mineral supplements as recommended by your health care provider. What happens during an annual well check? The services and screenings done by your health care provider during your annual well check will depend on your age, overall health, lifestyle risk factors, and family history of disease. Counseling  Your health care provider may ask you questions about your: Alcohol use. Tobacco use. Drug use. Emotional well-being. Home and relationship well-being. Sexual activity. Eating habits. History of falls. Memory and ability to understand (cognition). Work and work Astronomer. Reproductive health. Screening  You may have the following tests or measurements: Height, weight, and BMI. Blood pressure. Lipid and cholesterol levels. These may be checked every 5 years, or more frequently if you are over 51 years old. Skin check. Lung cancer screening. You may have this screening every year starting at age 77 if you have a 30-pack-year history of smoking and currently smoke or have quit within the past 15 years. Fecal occult blood test (FOBT) of the stool. You may have this test every year starting at age 9. Flexible sigmoidoscopy or colonoscopy. You may have a sigmoidoscopy every 5 years or a colonoscopy every 10 years starting at age 34. Hepatitis C blood test. Hepatitis B blood test. Sexually transmitted disease (STD) testing. Diabetes screening. This is done by checking your blood sugar (glucose) after  you have not eaten for a while (fasting). You may have this done every 1-3 years. Bone density scan. This is done to screen for osteoporosis. You may have this done starting at age 24. Mammogram. This may be done every 1-2 years. Talk to your health care provider about how often you should have regular mammograms. Talk with your health care provider about your test results, treatment  options, and if necessary, the need for more tests. Vaccines  Your health care provider may recommend certain vaccines, such as: Influenza vaccine. This is recommended every year. Tetanus, diphtheria, and acellular pertussis (Tdap, Td) vaccine. You may need a Td booster every 10 years. Zoster vaccine. You may need this after age 55. Pneumococcal 13-valent conjugate (PCV13) vaccine. One dose is recommended after age 50. Pneumococcal polysaccharide (PPSV23) vaccine. One dose is recommended after age 27. Talk to your health care provider about which screenings and vaccines you need and how often you need them. This information is not intended to replace advice given to you by your health care provider. Make sure you discuss any questions you have with your health care provider. Document Released: 11/30/2015 Document Revised: 07/23/2016 Document Reviewed: 09/04/2015 Elsevier Interactive Patient Education  2017 [place] Prevention in the Home Falls can cause injuries. They can happen to people of all ages. There are many things you can do to make your home safe and to help prevent falls. What can I do on the outside of my home? Regularly fix the edges of walkways and driveways and fix any cracks. Remove anything that might make you trip as you walk through a door, such as a raised step or threshold. Trim any bushes or trees on the path to your home. Use bright outdoor lighting. Clear any walking paths of anything that might make someone trip, such as rocks or tools. Regularly check to see if handrails are loose or broken. Make sure that both sides of any steps have handrails. Any raised decks and porches should have guardrails on the edges. Have any leaves, snow, or ice cleared regularly. Use sand or salt on walking paths during winter. Clean up any spills in your garage right away. This includes oil or grease spills. What can I do in the bathroom? Use night lights. Install grab  bars by the toilet and in the tub and shower. Do not use towel bars as grab bars. Use non-skid mats or decals in the tub or shower. If you need to sit down in the shower, use a plastic, non-slip stool. Keep the floor dry. Clean up any water that spills on the floor as soon as it happens. Remove soap buildup in the tub or shower regularly. Attach bath mats securely with double-sided non-slip rug tape. Do not have throw rugs and other things on the floor that can make you trip. What can I do in the bedroom? Use night lights. Make sure that you have a light by your bed that is easy to reach. Do not use any sheets or blankets that are too big for your bed. They should not hang down onto the floor. Have a firm chair that has side arms. You can use this for support while you get dressed. Do not have throw rugs and other things on the floor that can make you trip. What can I do in the kitchen? Clean up any spills right away. Avoid walking on wet floors. Keep items that you use a lot in easy-to-reach places. If you  need to reach something above you, use a strong step stool that has a grab bar. Keep electrical cords out of the way. Do not use floor polish or wax that makes floors slippery. If you must use wax, use non-skid floor wax. Do not have throw rugs and other things on the floor that can make you trip. What can I do with my stairs? Do not leave any items on the stairs. Make sure that there are handrails on both sides of the stairs and use them. Fix handrails that are broken or loose. Make sure that handrails are as long as the stairways. Check any carpeting to make sure that it is firmly attached to the stairs. Fix any carpet that is loose or worn. Avoid having throw rugs at the top or bottom of the stairs. If you do have throw rugs, attach them to the floor with carpet tape. Make sure that you have a light switch at the top of the stairs and the bottom of the stairs. If you do not have them,  ask someone to add them for you. What else can I do to help prevent falls? Wear shoes that: Do not have high heels. Have rubber bottoms. Are comfortable and fit you well. Are closed at the toe. Do not wear sandals. If you use a stepladder: Make sure that it is fully opened. Do not climb a closed stepladder. Make sure that both sides of the stepladder are locked into place. Ask someone to hold it for you, if possible. Clearly mark and make sure that you can see: Any grab bars or handrails. First and last steps. Where the edge of each step is. Use tools that help you move around (mobility aids) if they are needed. These include: Canes. Walkers. Scooters. Crutches. Turn on the lights when you go into a dark area. Replace any light bulbs as soon as they burn out. Set up your furniture so you have a clear path. Avoid moving your furniture around. If any of your floors are uneven, fix them. If there are any pets around you, be aware of where they are. Review your medicines with your doctor. Some medicines can make you feel dizzy. This can increase your chance of falling. Ask your doctor what other things that you can do to help prevent falls. This information is not intended to replace advice given to you by your health care provider. Make sure you discuss any questions you have with your health care provider. Document Released: 08/30/2009 Document Revised: 04/10/2016 Document Reviewed: 12/08/2014 Elsevier Interactive Patient Education  2017 [employer].

## 2023-04-05 ENCOUNTER — Other Ambulatory Visit: Payer: Self-pay | Admitting: Pulmonary Disease

## 2023-04-05 DIAGNOSIS — J209 Acute bronchitis, unspecified: Secondary | ICD-10-CM

## 2023-04-14 ENCOUNTER — Other Ambulatory Visit: Payer: Self-pay | Admitting: Pulmonary Disease

## 2023-05-26 DIAGNOSIS — H25013 Cortical age-related cataract, bilateral: Secondary | ICD-10-CM | POA: Diagnosis not present

## 2023-05-26 DIAGNOSIS — H2513 Age-related nuclear cataract, bilateral: Secondary | ICD-10-CM | POA: Diagnosis not present

## 2023-05-27 ENCOUNTER — Telehealth: Payer: Self-pay

## 2023-05-27 DIAGNOSIS — M810 Age-related osteoporosis without current pathological fracture: Secondary | ICD-10-CM

## 2023-05-27 MED ORDER — ALENDRONATE SODIUM 70 MG PO TABS
ORAL_TABLET | ORAL | 1 refills | Status: DC
Start: 2023-05-27 — End: 2023-10-12

## 2023-05-27 NOTE — Telephone Encounter (Signed)
Prescription Request  05/27/2023  LOV: 12/10/22 AWV 03/26/23  What is the name of the medication or equipment?   alendronate (FOSAMAX) 70 MG tablet    Have you contacted your pharmacy to request a refill? Yes   Which pharmacy would you like this sent to?  [id number] - [place], [place] - [address] [place] [address] Phone: [phone] Fax: [phone]    Patient notified that their request is being sent to the clinical staff for review and that they should receive a response within 2 business days.   Please advise at Mobile [phone] (mobile)

## 2023-05-27 NOTE — Telephone Encounter (Signed)
Refill sent.

## 2023-06-30 DIAGNOSIS — D1801 Hemangioma of skin and subcutaneous tissue: Secondary | ICD-10-CM | POA: Diagnosis not present

## 2023-06-30 DIAGNOSIS — D2271 Melanocytic nevi of right lower limb, including hip: Secondary | ICD-10-CM | POA: Diagnosis not present

## 2023-06-30 DIAGNOSIS — L814 Other melanin hyperpigmentation: Secondary | ICD-10-CM | POA: Diagnosis not present

## 2023-06-30 DIAGNOSIS — D225 Melanocytic nevi of trunk: Secondary | ICD-10-CM | POA: Diagnosis not present

## 2023-06-30 DIAGNOSIS — D2262 Melanocytic nevi of left upper limb, including shoulder: Secondary | ICD-10-CM | POA: Diagnosis not present

## 2023-06-30 DIAGNOSIS — D2272 Melanocytic nevi of left lower limb, including hip: Secondary | ICD-10-CM | POA: Diagnosis not present

## 2023-06-30 DIAGNOSIS — L820 Inflamed seborrheic keratosis: Secondary | ICD-10-CM | POA: Diagnosis not present

## 2023-06-30 DIAGNOSIS — L821 Other seborrheic keratosis: Secondary | ICD-10-CM | POA: Diagnosis not present

## 2023-06-30 DIAGNOSIS — D2261 Melanocytic nevi of right upper limb, including shoulder: Secondary | ICD-10-CM | POA: Diagnosis not present

## 2023-07-06 ENCOUNTER — Telehealth: Payer: Self-pay | Admitting: Pulmonary Disease

## 2023-07-06 MED ORDER — FLUTICASONE-SALMETEROL 250-50 MCG/ACT IN AEPB: 1.0000 | INHALATION_SPRAY | Freq: Two times a day (BID) | RESPIRATORY_TRACT | 6 refills | Status: DC

## 2023-07-06 MED ORDER — FLUTICASONE-SALMETEROL 250-50 MCG/ACT IN AEPB: 1.0000 | INHALATION_SPRAY | Freq: Two times a day (BID) | RESPIRATORY_TRACT | 1 refills | Status: DC

## 2023-07-06 NOTE — Telephone Encounter (Signed)
Spoke with patient. I have her scheduled to See Dr. Judeth Horn next Monday. States she has noticed some improvement since using Advair. NFN at this time

## 2023-07-06 NOTE — Telephone Encounter (Signed)
Spoke with patient she complains of wheezing, fatigue, chest tightness/heaviness,productive cough with green phlegm No fever  Symptoms started about a week ago [facility]acy [facility] [address]  [place] please advise since Dr. Judeth Horn is on nights

## 2023-07-06 NOTE — Telephone Encounter (Signed)
Patient overdue for office visit, she has not been seen in over a year. If having acute symptoms should go to UC or contact PCP. Please get patient scheduled with Dr. Judeth Horn first available. Make sure patient is taking Zyrtec and Flonase daily.  She can use albuterol 2 puffs every 6 hours for breakthrough shortness of breath or wheezing. Sending in RX for Advair with 1 refill.

## 2023-07-06 NOTE — Telephone Encounter (Signed)
Pt having a COPD flare up and afraid of ending up in the hospital

## 2023-07-13 ENCOUNTER — Ambulatory Visit (INDEPENDENT_AMBULATORY_CARE_PROVIDER_SITE_OTHER): Payer: Medicare Other | Admitting: Pulmonary Disease

## 2023-07-13 ENCOUNTER — Encounter: Payer: Self-pay | Admitting: Pulmonary Disease

## 2023-07-13 VITALS — BP 120/80 | HR 62 | Temp 98.2°F | Ht 65.0 in | Wt 164.6 lb

## 2023-07-13 DIAGNOSIS — J4541 Moderate persistent asthma with (acute) exacerbation: Secondary | ICD-10-CM | POA: Diagnosis not present

## 2023-07-13 DIAGNOSIS — J309 Allergic rhinitis, unspecified: Secondary | ICD-10-CM

## 2023-07-13 MED ORDER — ALBUTEROL SULFATE HFA 108 (90 BASE) MCG/ACT IN AERS: 2.0000 | INHALATION_SPRAY | Freq: Four times a day (QID) | RESPIRATORY_TRACT | 6 refills | Status: AC | PRN

## 2023-07-13 MED ORDER — PREDNISONE 20 MG PO TABS: 20.0000 mg | ORAL_TABLET | Freq: Every day | ORAL | 0 refills | Status: AC

## 2023-07-13 MED ORDER — FLUTICASONE-SALMETEROL 250-50 MCG/ACT IN AEPB: 1.0000 | INHALATION_SPRAY | Freq: Two times a day (BID) | RESPIRATORY_TRACT | 11 refills | Status: DC

## 2023-07-13 NOTE — Patient Instructions (Addendum)
Nice to see you again  Prednisone 20 mg daily for 5 days  Continue Advair - long term refills sent today  Continue nebulizer and rescue inhaler as needed  Return to clinic in 5 months

## 2023-07-13 NOTE — Addendum Note (Signed)
Addended byVilma Meckel on: 07/13/2023 01:43 PM   Modules accepted: Level of Service

## 2023-07-13 NOTE — Progress Notes (Signed)
@Patient  ID: Veronica Barrett, female    DOB: 29-May-1951, 72 y.o.   MRN: [id number]  Chief Complaint  Patient presents with   Acute Visit    Pt here for productive cough with greenish mucus for 9 days.    Referring provider: Melida Quitter, PA  HPI:   72 y.o. woman whom we are seeing in follow up for evaluation of dyspnea on exertion felt to be related to asthma.  Most recent PCP note x 2 reviewed.  Overall doing well.  Worsening cough productive of green sputum.  Over the last 9 to 10 days.  Triggered by pollens.  Routinely at the same time a year this happens.  Denies viral or infectious symptoms.  Start using rescue inhaler and improved.  Ran out of Advair in the interim.  After was recent last week and cough is improving.  She reports wheezing audibly.  She is wheeze on exam today.  Overall things are greatly improved with resumption of Advair mid dose discus.  Residual symptoms lingering.  Suspect will improve with time.  But also discussed a treatment regimen to speed recovery.  HPI at initial visit: Patient hospitalized earlier this month with wheezing, bronchitis.  She may treat with outpatient courses of prednisone as well as antibiotics.  Despite this her symptoms of not significant proved.  Mildly worsened.  There is mention of hypoxemia although I do not see any documented true hypoxemia, saturations low 90s on room air in the ED.  She was placed on oxygen supplementation during hospitalization.  She was put on higher dose steroids in the hospital and gradually her breathing and wheezing improved.  She was discharged on steroid taper, Dulera mid dose, Advair mid dose, DuoNebs although she has not used them, and rescue inhaler.  She feels her rescue inhaler use is decreased with time.  Overall her breathing has improved with time.  She took her last dose of prednisone today.  She continues have dyspnea on exertion although again improving.  Cough is improving as well.  Productive of gray  sputum.  No time during day when things are better or worse.  No relieving exacerbating factors other albuterol seems to help some.  Reports being diagnosed with a touch of asthma about 10 years ago.  Endorses seasonal allergies, runny nose, itchy eyes.  Uses Zyrtec daily.  Asthma runs in the family.  Reviewed at length serial chest imaging on my interpretation 06/09/2021 demonstrates small retrocardiac opacity with what appears to be correlate in the base of the left lung along the posterior spine.  Subsequent chest x-ray about a week later 06/2021 with improvement in both retrocardiac opacity and correlate on the lateral film.  Reviewed rib x-ray 06/21/2021 that did not demonstrate rib fracture on review of results.  PMH: Seasonal allergies, asthma Surgical history: Appendectomy, hip replacement Family history: Thyroid cancer in sister, father had other form of cancer, no respiratory illness in first relatives that she can recall Social history: She is a never smoker, lives in [place], near [facility], retired   Public house manager / Pulmonary Flowsheets:   ACT:  Asthma Control Test ACT Total Score  12/26/2021  9:56 AM 24  09/25/2021 10:02 AM 23  06/27/2021  1:52 PM 9    MMRC:     No data to display          Epworth:      No data to display          Tests:  FENO:  No results found for: "NITRICOXIDE"  PFT:     No data to display          WALK:      No data to display          Imaging: Personally reviewed as per EMR discussion in this note No results found.  Lab Results: Personally reviewed, notably eosinophils no higher than 200 in the past CBC    Component Value Date/Time   WBC 5.2 03/10/2023 0859   WBC 8.5 06/20/2021 0430   RBC 4.16 03/10/2023 0859   RBC 4.14 06/20/2021 0430   HGB 13.1 03/10/2023 0859   HCT 39.6 03/10/2023 0859   PLT 273 03/10/2023 0859   MCV 95 03/10/2023 0859   MCH 31.5 03/10/2023 0859   MCH 30.7 06/20/2021 0430   MCHC 33.1  03/10/2023 0859   MCHC 32.1 06/20/2021 0430   RDW 12.1 03/10/2023 0859   LYMPHSABS 1.5 03/10/2023 0859   MONOABS 0.4 06/20/2021 0430   EOSABS 0.3 03/10/2023 0859   BASOSABS 0.0 03/10/2023 0859    BMET    Component Value Date/Time   NA 141 03/10/2023 0859   K 5.2 03/10/2023 0859   CL 105 03/10/2023 0859   CO2 23 03/10/2023 0859   GLUCOSE 89 03/10/2023 0859   GLUCOSE 104 (H) 06/19/2021 0408   BUN 14 03/10/2023 0859   CREATININE 0.72 03/10/2023 0859   CALCIUM 9.4 03/10/2023 0859   GFRNONAA >60 06/19/2021 0408   GFRAA 103 02/14/2020 1000    BNP    Component Value Date/Time   BNP 37.4 06/20/2021 0430    ProBNP No results found for: "PROBNP"  Specialty Problems       Pulmonary Problems   Allergic rhinitis    Qualifier: Diagnosis of  By: Rosette Reveal RN, Jorene Minors       Acute bronchitis    Qualifier: Diagnosis of  By: Clent Ridges MD, Jeannett Senior A       Chronic sinusitis   Acute asthma exacerbation   Bronchitis    Allergies  Allergen Reactions   Seasonal Ic [Cholestatin]    Tape Rash    Immunization History  Administered Date(s) Administered   Influenza Whole 08/22/2009, 09/19/2010   Influenza, High Dose Seasonal PF 08/31/2018, 09/19/2019, 09/10/2022   Influenza,inj,Quad PF,6+ Mos 10/02/2014   Influenza-Unspecified 08/17/2014   PFIZER(Purple Top)SARS-COV-2 Vaccination 12/25/2019, 01/19/2020   Pneumococcal Conjugate-13 08/31/2018   Td 10/27/2000   Tdap 04/21/2013    Past Medical History:  Diagnosis Date   Allergy    seasonal allergy   Arthritis    left hip   Asthma    Depression    HSV infection    vag    Tobacco History: Social History   Tobacco Use  Smoking Status Never  Smokeless Tobacco Never   Counseling given: Not Answered    Continue to not smoke  Outpatient Encounter Medications as of 07/13/2023  Medication Sig   acyclovir (ZOVIRAX) 200 MG capsule Take 1 capsule (200 mg total) by mouth 5 (five) times daily as needed (breakouts).    alendronate (FOSAMAX) 70 MG tablet TAKE 1 TABLET BY MOUTH ONCE A WEEK. TAKE WITH A FULL GLASS OF WATER ON AN EMPTY STOMACH.   atorvastatin (LIPITOR) 20 MG tablet Take 1 tablet (20 mg total) by mouth daily.   cetirizine (ZYRTEC) 10 MG tablet Take 10 mg by mouth daily.   citalopram (CELEXA) 20 MG tablet TAKE 1 AND 1/2 TABLETS BY MOUTH EVERY DAY   ipratropium-albuterol (DUONEB)  0.5-2.5 (3) MG/3ML SOLN Take 3 mLs by nebulization every 6 (six) hours as needed (shortness of breath, cough or wheeze).   Nebulizer System All-In-One MISC Use with nebulized albuterol once every 4-6 hours for wheezing and shortness of breath.   pantoprazole (PROTONIX) 40 MG tablet Take 1 tablet (40 mg total) by mouth daily.   predniSONE (DELTASONE) 20 MG tablet Take 1 tablet (20 mg total) by mouth daily with breakfast for 5 days.   Respiratory Therapy Supplies (NEBULIZER MASK ADULT) MISC 1 Inhaler by Does not apply route every 4 (four) hours as needed.   [DISCONTINUED] albuterol (VENTOLIN HFA) 108 (90 Base) MCG/ACT inhaler Inhale 2 puffs into the lungs every 6 (six) hours as needed for wheezing or shortness of breath.   [DISCONTINUED] fluticasone-salmeterol (ADVAIR DISKUS) 250-50 MCG/ACT AEPB Inhale 1 puff into the lungs in the morning and at bedtime.   albuterol (VENTOLIN HFA) 108 (90 Base) MCG/ACT inhaler Inhale 2 puffs into the lungs every 6 (six) hours as needed for wheezing or shortness of breath.   amoxicillin (AMOXIL) 500 MG tablet SMARTSIG:4 Tablet(s) By Mouth (Patient not taking: Reported on 07/13/2023)   fluticasone (FLONASE) 50 MCG/ACT nasal spray Place 1 spray into both nostrils daily.   fluticasone-salmeterol (ADVAIR DISKUS) 250-50 MCG/ACT AEPB Inhale 1 puff into the lungs in the morning and at bedtime.   No facility-administered encounter medications on file as of 07/13/2023.     Review of Systems  Review of Systems  N/a Physical Exam  BP 120/80 (BP Location: Right Arm, Cuff Size: Normal)   Pulse 62    Temp 98.2 F (36.8 C) (Oral)   Ht 5\' 5"  (1.651 m)   Wt 164 lb 9.6 oz (74.7 kg)   SpO2 98%   BMI 27.39 kg/m   Wt Readings from Last 5 Encounters:  07/13/23 164 lb 9.6 oz (74.7 kg)  03/26/23 162 lb (73.5 kg)  12/10/22 167 lb 12.8 oz (76.1 kg)  12/26/21 182 lb 12.8 oz (82.9 kg)  09/25/21 185 lb 6.4 oz (84.1 kg)    BMI Readings from Last 5 Encounters:  07/13/23 27.39 kg/m  03/26/23 26.96 kg/m  12/10/22 27.92 kg/m  12/26/21 30.42 kg/m  09/25/21 30.85 kg/m     Physical Exam General: Well-appearing, no acute distress Eyes: EOMI, no icterus Neck: Supple, no JVP Cardiovascular: Regular rate and rhythm, no murmurs Pulmonary: Diffuse mild end expiratory wheeze, normal breathing on room air   Assessment & Plan:   Dyspnea on exertion:  Briefly hospitalized for severe bronchitis.  Possible small pneumonia on chest x-ray on my interpretation although not called by radiology.  Suspect triggered quiescent asthma given ongoing symptoms of wheeze on exam.  She has atopic symptoms.  Dyspnea essentially resolved, exercising regularly, De-escalated high-dose Advair HFA to mid dose diskus 11/22.  Symptoms remain controlled  Asthma with exacerbation: Atopic symptoms, wheezing, prolonged bronchitis responsive to steroids including hospitalization 06/2021.  Dyspnea improved with high-dose Advair HFA.  De-escalated to mid dose Advair diskus.  Symptoms were well-controlled and ran out of inhaler over the last several weeks.  With productive cough etc.  Wheeze on exam.  Some improvement with resumption of Advair last week.  Prednisone 20 mg daily sent to pharmacy.  Advair refilled.  Albuterol refilled.   Return in about 5 months (around 12/13/2023).   Karren Burly, MD 07/13/2023

## 2023-07-29 ENCOUNTER — Ambulatory Visit: Payer: Medicare Other | Admitting: Family Medicine

## 2023-07-30 ENCOUNTER — Encounter: Payer: Self-pay | Admitting: Pulmonary Disease

## 2023-07-31 NOTE — Telephone Encounter (Signed)
Patient checking on sick message. Pharmacy is [facility]. Patient phone number is [phone].

## 2023-07-31 NOTE — Telephone Encounter (Signed)
Patient is calling because she is still experiencing sickness; wheezing, cough and possible sinus infection. Patient would like a call back [phone].

## 2023-08-10 ENCOUNTER — Ambulatory Visit (INDEPENDENT_AMBULATORY_CARE_PROVIDER_SITE_OTHER): Payer: Medicare Other | Admitting: Family Medicine

## 2023-08-10 ENCOUNTER — Ambulatory Visit
Admission: RE | Admit: 2023-08-10 | Discharge: 2023-08-10 | Disposition: A | Discharge status: Home / Self Care | Payer: Medicare Other | Source: Ambulatory Visit | Attending: Family Medicine | Admitting: Family Medicine

## 2023-08-10 ENCOUNTER — Encounter: Payer: Self-pay | Admitting: Family Medicine

## 2023-08-10 VITALS — BP 126/80 | HR 67 | Temp 98.0°F | Resp 22 | Ht 65.0 in | Wt 169.0 lb

## 2023-08-10 DIAGNOSIS — R0989 Other specified symptoms and signs involving the circulatory and respiratory systems: Secondary | ICD-10-CM | POA: Diagnosis not present

## 2023-08-10 DIAGNOSIS — Z1159 Encounter for screening for other viral diseases: Secondary | ICD-10-CM

## 2023-08-10 DIAGNOSIS — E782 Mixed hyperlipidemia: Secondary | ICD-10-CM | POA: Diagnosis not present

## 2023-08-10 DIAGNOSIS — J4540 Moderate persistent asthma, uncomplicated: Secondary | ICD-10-CM | POA: Diagnosis not present

## 2023-08-10 DIAGNOSIS — R059 Cough, unspecified: Secondary | ICD-10-CM | POA: Diagnosis not present

## 2023-08-10 DIAGNOSIS — F339 Major depressive disorder, recurrent, unspecified: Secondary | ICD-10-CM | POA: Diagnosis not present

## 2023-08-10 DIAGNOSIS — R058 Other specified cough: Secondary | ICD-10-CM

## 2023-08-10 DIAGNOSIS — R0602 Shortness of breath: Secondary | ICD-10-CM | POA: Diagnosis not present

## 2023-08-10 MED ORDER — CITALOPRAM HYDROBROMIDE 20 MG PO TABS
ORAL_TABLET | ORAL | 1 refills | Status: DC
Start: 2023-08-10 — End: 2024-02-01

## 2023-08-10 MED ORDER — MONTELUKAST SODIUM 10 MG PO TABS: 10.0000 mg | ORAL_TABLET | Freq: Every day | ORAL | 3 refills | Status: DC

## 2023-08-10 MED ORDER — ATORVASTATIN CALCIUM 20 MG PO TABS
20.0000 mg | ORAL_TABLET | Freq: Every day | ORAL | 1 refills | Status: DC
Start: 2023-08-10 — End: 2024-02-01

## 2023-08-10 NOTE — Assessment & Plan Note (Signed)
Last lipid panel: LDL 75, HDL 61, triglycerides 68.  Repeating lipid panel and CMP for hepatic function today.  Continue atorvastatin 20 mg daily unless lab results warrant change.

## 2023-08-10 NOTE — Progress Notes (Signed)
Established Patient Office Visit  Subjective   Patient ID: Veronica Barrett, female    DOB: October 15, 1951  Age: 72 y.o. MRN: [id number]  Chief Complaint  Patient presents with   Osteoporosis   Hyperlipidemia   Asthma    HPI Veronica Barrett is a 72 y.o. female presenting today for follow up of hyperlipidemia, asthma, mood. Hyperlipidemia: tolerating atorvastatin 20 mg daily well with no myalgias or significant side effects.  The 10-year ASCVD risk score (Arnett DK, et al., 2019) is: 9.4% Asthma: Patient presents for asthma follow-up, also followed routinely by pulmonology. She is not currently in exacerbation.  Using Advair discus 1 puff twice daily for maintenance, also has albuterol inhaler and DuoNeb for acute exacerbations. For about 5 weeks, she has had nasal congestion, chest congestion, and a productive cough for which she is using her albuterol rescue inhaler more often. She states that this time of year her allergies are worse which tends to trigger her asthma which is otherwise well controlled on her Advair maintenance medicine. Mood: Patient is here to follow up for depression, currently managing with Celexa 20 mg daily.      08/10/2023   10:30 AM 03/26/2023    3:24 PM 12/10/2022    3:36 PM  Depression screen PHQ 2/9  Decreased Interest 1 0 0  Down, Depressed, Hopeless 1 0 0  PHQ - 2 Score 2 0 0  Altered sleeping 0  0  Tired, decreased energy 1  0  Change in appetite 1  0  Feeling bad or failure about yourself  1  0  Trouble concentrating 0  0  Moving slowly or fidgety/restless 0  0  Suicidal thoughts   0  PHQ-9 Score 5  0  Difficult doing work/chores Not difficult at all         12/10/2022    3:36 PM 06/12/2021   10:15 AM 06/05/2021    2:12 PM  GAD 7 : Generalized Anxiety Score  Nervous, Anxious, on Edge 0 0 0  Control/stop worrying 0 0 0  Worry too much - different things 0 0 0  Trouble relaxing 0 0 0  Restless 0 0 0  Easily annoyed or irritable 0 0 0  Afraid - awful  might happen 0 0 0  Total GAD 7 Score 0 0 0   Outpatient Medications Prior to Visit  Medication Sig   acyclovir (ZOVIRAX) 200 MG capsule Take 1 capsule (200 mg total) by mouth 5 (five) times daily as needed (breakouts).   albuterol (VENTOLIN HFA) 108 (90 Base) MCG/ACT inhaler Inhale 2 puffs into the lungs every 6 (six) hours as needed for wheezing or shortness of breath.   alendronate (FOSAMAX) 70 MG tablet TAKE 1 TABLET BY MOUTH ONCE A WEEK. TAKE WITH A FULL GLASS OF WATER ON AN EMPTY STOMACH.   cetirizine (ZYRTEC) 10 MG tablet Take 10 mg by mouth daily.   fluticasone-salmeterol (ADVAIR DISKUS) 250-50 MCG/ACT AEPB Inhale 1 puff into the lungs in the morning and at bedtime.   ipratropium-albuterol (DUONEB) 0.5-2.5 (3) MG/3ML SOLN Take 3 mLs by nebulization every 6 (six) hours as needed (shortness of breath, cough or wheeze).   Nebulizer System All-In-One MISC Use with nebulized albuterol once every 4-6 hours for wheezing and shortness of breath.   pantoprazole (PROTONIX) 40 MG tablet Take 1 tablet (40 mg total) by mouth daily.   Respiratory Therapy Supplies (NEBULIZER MASK ADULT) MISC 1 Inhaler by Does not apply route every 4 (four)  hours as needed.   [DISCONTINUED] atorvastatin (LIPITOR) 20 MG tablet Take 1 tablet (20 mg total) by mouth daily.   [DISCONTINUED] citalopram (CELEXA) 20 MG tablet TAKE 1 AND 1/2 TABLETS BY MOUTH EVERY DAY   [DISCONTINUED] amoxicillin (AMOXIL) 500 MG tablet SMARTSIG:4 Tablet(s) By Mouth (Patient not taking: Reported on 07/13/2023)   [DISCONTINUED] fluticasone (FLONASE) 50 MCG/ACT nasal spray Place 1 spray into both nostrils daily.   No facility-administered medications prior to visit.    ROS Negative unless otherwise noted in HPI   Objective:     BP 126/80 (BP Location: Left Arm, Patient Position: Sitting, Cuff Size: Normal)   Pulse 67   Temp 98 F (36.7 C) (Oral)   Resp (!) 22   Ht 5\' 5"  (1.651 m)   Wt 169 lb (76.7 kg)   SpO2 98%   BMI 28.12 kg/m    Physical Exam Constitutional:      General: She is not in acute distress.    Appearance: Normal appearance.  HENT:     Head: Normocephalic and atraumatic.  Cardiovascular:     Rate and Rhythm: Normal rate and regular rhythm.     Heart sounds: No murmur heard.    No friction rub. No gallop.  Pulmonary:     Effort: Pulmonary effort is normal. No respiratory distress.     Breath sounds: Wheezing (throughout lungs bilaterally) and rhonchi (right lung) present. No rales.  Skin:    General: Skin is warm and dry.  Neurological:     Mental Status: She is alert and oriented to person, place, and time.     Assessment & Plan:  Mixed hyperlipidemia Assessment & Plan: Last lipid panel: LDL 75, HDL 61, triglycerides 68.  Repeating lipid panel and CMP for hepatic function today.  Continue atorvastatin 20 mg daily unless lab results warrant change.  Orders: -     CBC with Differential/Platelet; Future -     Comprehensive metabolic panel; Future -     Lipid panel; Future  Depression, recurrent (HCC) Assessment & Plan: Fairly stable.  Continue Celexa 20 mg daily.  Will continue to monitor.  Orders: -     CBC with Differential/Platelet; Future -     Comprehensive metabolic panel; Future -     Citalopram Hydrobromide; TAKE 1 AND 1/2 TABLETS BY MOUTH EVERY DAY  Dispense: 135 tablet; Refill: 1 -     Atorvastatin Calcium; Take 1 tablet (20 mg total) by mouth daily.  Dispense: 90 tablet; Refill: 1  Moderate persistent asthma without complication Assessment & Plan: Continue routine follow-up with pulmonology.  Continue Advair discus 1 puff twice daily for maintenance.  Use albuterol inhaler for rescue and DuoNeb for exacerbations.  Since fall tends to increase triggers, add montelukast 10 mg daily.  Also recommend chest x-ray for ongoing URI symptoms and rhonchi on exam to rule out pneumonia and need for antibiotics.  Follow-up in 2 months.  If at that time asthma is well-controlled, recommend  switching to Airsupra combination albuterol-budesonide rescue inhaler per GINA guideline recommendations.  Orders: -     Montelukast Sodium; Take 1 tablet (10 mg total) by mouth at bedtime.  Dispense: 90 tablet; Refill: 3  Screening for viral disease -     Hepatitis C antibody; Future  Rhonchi -     DG Chest 2 View; Future  Productive cough -     DG Chest 2 View; Future    Return in about 2 months (around 10/10/2023) for follow-up for asthma.  Melida Quitter, PA

## 2023-08-10 NOTE — Assessment & Plan Note (Addendum)
Continue routine follow-up with pulmonology.  Continue Advair discus 1 puff twice daily for maintenance.  Use albuterol inhaler for rescue and DuoNeb for exacerbations.  Since fall tends to increase triggers, add montelukast 10 mg daily.  Also recommend chest x-ray for ongoing URI symptoms and rhonchi on exam to rule out pneumonia and need for antibiotics.  Follow-up in 2 months.  If at that time asthma is well-controlled, recommend switching to Airsupra combination albuterol-budesonide rescue inhaler per GINA guideline recommendations.

## 2023-08-10 NOTE — Assessment & Plan Note (Addendum)
Fairly stable.  Continue Celexa 20 mg daily.  Will continue to monitor.

## 2023-08-10 NOTE — Patient Instructions (Addendum)
If things get worse, please send me a message on MyChart!  Continue using your Advair inhaler twice a day.  START taking montelukast once a day.  Continue using your current albuterol rescue inhaler, and in the future we may be able to upgrade you!  Please go to [facility] today to have a chest x-ray done.

## 2023-08-11 LAB — CBC WITH DIFFERENTIAL/PLATELET
Basophils Absolute: 0 10*3/uL (ref 0.0–0.2)
Basos: 0 %
EOS (ABSOLUTE): 1.2 10*3/uL — ABNORMAL HIGH (ref 0.0–0.4)
Eos: 18 %
Hematocrit: 39.3 % (ref 34.0–46.6)
Hemoglobin: 12.9 g/dL (ref 11.1–15.9)
Immature Grans (Abs): 0 10*3/uL (ref 0.0–0.1)
Immature Granulocytes: 0 %
Lymphocytes Absolute: 1.6 10*3/uL (ref 0.7–3.1)
Lymphs: 24 %
MCH: 31.2 pg (ref 26.6–33.0)
MCHC: 32.8 g/dL (ref 31.5–35.7)
MCV: 95 fL (ref 79–97)
Monocytes Absolute: 0.6 10*3/uL (ref 0.1–0.9)
Monocytes: 9 %
Neutrophils Absolute: 3.4 10*3/uL (ref 1.4–7.0)
Neutrophils: 49 %
Platelets: 366 10*3/uL (ref 150–450)
RBC: 4.14 x10E6/uL (ref 3.77–5.28)
RDW: 11.8 % (ref 11.7–15.4)
WBC: 6.8 10*3/uL (ref 3.4–10.8)

## 2023-08-11 LAB — LIPID PANEL
Chol/HDL Ratio: 2.8 ratio (ref 0.0–4.4)
Cholesterol, Total: 170 mg/dL (ref 100–199)
HDL: 61 mg/dL (ref >39)
LDL Chol Calc (NIH): 91 mg/dL (ref 0–99)
Triglycerides: 99 mg/dL (ref 0–149)
VLDL Cholesterol Cal: 18 mg/dL (ref 5–40)

## 2023-08-11 LAB — COMPREHENSIVE METABOLIC PANEL
ALT: 15 IU/L (ref 0–32)
AST: 15 IU/L (ref 0–40)
Albumin: 4.4 g/dL (ref 3.8–4.8)
Alkaline Phosphatase: 101 IU/L (ref 44–121)
BUN/Creatinine Ratio: 21 (ref 12–28)
BUN: 14 mg/dL (ref 8–27)
Bilirubin Total: 0.4 mg/dL (ref 0.0–1.2)
CO2: 22 mmol/L (ref 20–29)
Calcium: 9.5 mg/dL (ref 8.7–10.3)
Chloride: 102 mmol/L (ref 96–106)
Creatinine, Ser: 0.67 mg/dL (ref 0.57–1.00)
Globulin, Total: 2.2 g/dL (ref 1.5–4.5)
Glucose: 86 mg/dL (ref 70–99)
Potassium: 4.2 mmol/L (ref 3.5–5.2)
Sodium: 142 mmol/L (ref 134–144)
Total Protein: 6.6 g/dL (ref 6.0–8.5)
eGFR: 93 mL/min/{1.73_m2} (ref >59)

## 2023-08-11 LAB — HEPATITIS C ANTIBODY: Hep C Virus Ab: NONREACTIVE

## 2023-08-14 DIAGNOSIS — J45901 Unspecified asthma with (acute) exacerbation: Secondary | ICD-10-CM | POA: Diagnosis not present

## 2023-08-19 ENCOUNTER — Ambulatory Visit: Payer: Medicare Other | Admitting: Pulmonary Disease

## 2023-08-21 ENCOUNTER — Other Ambulatory Visit: Payer: Self-pay | Admitting: Family Medicine

## 2023-08-21 DIAGNOSIS — Z1212 Encounter for screening for malignant neoplasm of rectum: Secondary | ICD-10-CM

## 2023-08-21 DIAGNOSIS — Z1211 Encounter for screening for malignant neoplasm of colon: Secondary | ICD-10-CM

## 2023-08-25 ENCOUNTER — Other Ambulatory Visit: Payer: Self-pay | Admitting: Nurse Practitioner

## 2023-08-25 DIAGNOSIS — B009 Herpesviral infection, unspecified: Secondary | ICD-10-CM

## 2023-09-07 DIAGNOSIS — Z1211 Encounter for screening for malignant neoplasm of colon: Secondary | ICD-10-CM | POA: Diagnosis not present

## 2023-09-07 DIAGNOSIS — Z1212 Encounter for screening for malignant neoplasm of rectum: Secondary | ICD-10-CM | POA: Diagnosis not present

## 2023-09-14 ENCOUNTER — Telehealth: Payer: Self-pay | Admitting: *Deleted

## 2023-09-14 NOTE — Telephone Encounter (Signed)
I recommended switching her rescue inhaler for asthma if her asthma remained well-controlled after she got through her current illness, it is unlikely that this will help with current symptoms if they are related to allergies.  If she would like to schedule an acute appointment and there are openings, she can.  Otherwise, it is hard to say specifically what next steps will be without seeing her and doing a physical evaluation.

## 2023-09-14 NOTE — Telephone Encounter (Signed)
Pt calling to say that she is still struggling and wanted to see about getting in sooner with PCP.  She mentioned that she has appointment with [facility] on Monday.  Informed her that she should go there definitely due to them being specialty.  She mentioned that PCP said something about changing inhaler possibly if didn't get better. She mentioned dulera.  Please advise what next steps should be.

## 2023-09-15 LAB — COLOGUARD: COLOGUARD: NEGATIVE

## 2023-09-15 NOTE — Telephone Encounter (Addendum)
LVM for pt to call office back to inform her of below and see if she would like to come in to be evaluated. However the provider said the [facility] would definitely be able to best help her.

## 2023-09-16 NOTE — Telephone Encounter (Signed)
Pt is going to see Allergist on Monday will call back if she is still needing to be seen

## 2023-09-18 DIAGNOSIS — Z1231 Encounter for screening mammogram for malignant neoplasm of breast: Secondary | ICD-10-CM | POA: Diagnosis not present

## 2023-09-18 LAB — HM MAMMOGRAPHY

## 2023-09-21 ENCOUNTER — Telehealth: Payer: Self-pay | Admitting: Pulmonary Disease

## 2023-09-21 DIAGNOSIS — J309 Allergic rhinitis, unspecified: Secondary | ICD-10-CM | POA: Diagnosis not present

## 2023-09-21 DIAGNOSIS — J4540 Moderate persistent asthma, uncomplicated: Secondary | ICD-10-CM | POA: Diagnosis not present

## 2023-09-21 DIAGNOSIS — J4541 Moderate persistent asthma with (acute) exacerbation: Secondary | ICD-10-CM | POA: Diagnosis not present

## 2023-09-21 NOTE — Telephone Encounter (Signed)
If the provider is ok with this we can schedule once there is an order

## 2023-09-21 NOTE — Telephone Encounter (Signed)
Allergy/Asthma doctor as advised patient to get a CT scan done since asthma has not cleared up.

## 2023-09-29 NOTE — Telephone Encounter (Signed)
Do we have notes from the clinic? Would be helpful to have documentation so can understand recommendation why other physician did not order scan.

## 2023-10-02 NOTE — Telephone Encounter (Signed)
Spoke with pts daughter Zollie Scale who is on EM contact list. She says DPR needs to updated as her father has passed away. She will let patient know per Dr. Laurena Spies request from allergist to fax over ov notes so that he may better understand why allergy didn't order CT. Verbalized understanding

## 2023-10-07 ENCOUNTER — Encounter: Payer: Self-pay | Admitting: Pulmonary Disease

## 2023-10-12 ENCOUNTER — Ambulatory Visit (INDEPENDENT_AMBULATORY_CARE_PROVIDER_SITE_OTHER): Payer: Medicare Other | Admitting: Family Medicine

## 2023-10-12 ENCOUNTER — Encounter: Payer: Self-pay | Admitting: Family Medicine

## 2023-10-12 VITALS — BP 114/80 | HR 65 | Resp 18 | Ht 64.57 in | Wt 163.0 lb

## 2023-10-12 DIAGNOSIS — J4540 Moderate persistent asthma, uncomplicated: Secondary | ICD-10-CM

## 2023-10-12 DIAGNOSIS — M810 Age-related osteoporosis without current pathological fracture: Secondary | ICD-10-CM | POA: Diagnosis not present

## 2023-10-12 NOTE — Assessment & Plan Note (Signed)
Lungs CTA bilaterally.  Continue routine follow-up with pulmonology and allergist.  Continue Dulera 2 puffs twice daily as prescribed by allergist, continue montelukast 10 mg daily for now.  Continue developing the treatment plan with allergist and pulmonologist.

## 2023-10-12 NOTE — Assessment & Plan Note (Signed)
Discussed the importance of calcium and vitamin D supplementation if discontinuing alendronate.  Patient would like to figure out her treatment plan with allergy and pulmonology before discussing alternatives for osteoporosis.  In the future, consider alternative bisphosphonate therapy and/or repeat DEXA as most recent scan was 06/04/2021.

## 2023-10-12 NOTE — Progress Notes (Signed)
Established Patient Office Visit  Subjective   Patient ID: Veronica Barrett, female    DOB: September 07, 1951  Age: 72 y.o. MRN: [id number]  Chief Complaint  Patient presents with   Asthma    HPI Veronica Barrett is a 72 y.o. female presenting today for follow up of asthma.  At last appointment, added montelukast 10 mg daily given allergies in the fall tend to increase triggers for asthma attacks.  She was visiting one of her daughters and went to an urgent care on 08/14/2023 and was started on a course of prednisone.  She had 1 appointment with the allergist and was switched from Advair to [facility] as well as started on a course of antibiotics, prednisone, and a cortisone shot.  She is feeling much better at this point and has allergy testing set up for December 11 and her next follow-up with pulmonology scheduled for January 20.  She also notes that she stopped taking alendronate due to jaw clicking and hip pain which she read can be side effects of the alendronate.  Symptoms have improved some since discontinuing the medication.  Outpatient Medications Prior to Visit  Medication Sig   mometasone-formoterol (DULERA) 200-5 MCG/ACT AERO Inhale 2 puffs into the lungs 2 (two) times daily.   acyclovir (ZOVIRAX) 200 MG capsule TAKE 1 CAPSULE BY MOUTH 5 TIMES DAILY AS NEEDED FOR BREAKOUTS   albuterol (VENTOLIN HFA) 108 (90 Base) MCG/ACT inhaler Inhale 2 puffs into the lungs every 6 (six) hours as needed for wheezing or shortness of breath.   atorvastatin (LIPITOR) 20 MG tablet Take 1 tablet (20 mg total) by mouth daily.   cetirizine (ZYRTEC) 10 MG tablet Take 10 mg by mouth daily.   citalopram (CELEXA) 20 MG tablet TAKE 1 AND 1/2 TABLETS BY MOUTH EVERY DAY   ipratropium-albuterol (DUONEB) 0.5-2.5 (3) MG/3ML SOLN Take 3 mLs by nebulization every 6 (six) hours as needed (shortness of breath, cough or wheeze).   montelukast (SINGULAIR) 10 MG tablet Take 1 tablet (10 mg total) by mouth at bedtime.   Nebulizer System  All-In-One MISC Use with nebulized albuterol once every 4-6 hours for wheezing and shortness of breath.   pantoprazole (PROTONIX) 40 MG tablet Take 1 tablet (40 mg total) by mouth daily.   Respiratory Therapy Supplies (NEBULIZER MASK ADULT) MISC 1 Inhaler by Does not apply route every 4 (four) hours as needed.   [DISCONTINUED] alendronate (FOSAMAX) 70 MG tablet TAKE 1 TABLET BY MOUTH ONCE A WEEK. TAKE WITH A FULL GLASS OF WATER ON AN EMPTY STOMACH.   [DISCONTINUED] fluticasone-salmeterol (ADVAIR DISKUS) 250-50 MCG/ACT AEPB Inhale 1 puff into the lungs in the morning and at bedtime.   No facility-administered medications prior to visit.    ROS Negative unless otherwise noted in HPI   Objective:     BP 114/80 (BP Location: Right Arm, Patient Position: Sitting, Cuff Size: Normal)   Pulse 65   Resp 18   Ht 5' 4.57" (1.64 m)   Wt 163 lb (73.9 kg)   SpO2 95%   BMI 27.49 kg/m   Physical Exam Constitutional:      General: She is not in acute distress.    Appearance: Normal appearance.  HENT:     Head: Normocephalic and atraumatic.  Cardiovascular:     Rate and Rhythm: Normal rate and regular rhythm.     Heart sounds: No murmur heard.    No friction rub. No gallop.  Pulmonary:     Effort: Pulmonary effort  is normal. No respiratory distress.     Breath sounds: No wheezing, rhonchi or rales.  Skin:    General: Skin is warm and dry.  Neurological:     Mental Status: She is alert and oriented to person, place, and time.     Assessment & Plan:  Moderate persistent asthma without complication Assessment & Plan: Lungs CTA bilaterally.  Continue routine follow-up with pulmonology and allergist.  Continue Dulera 2 puffs twice daily as prescribed by allergist, continue montelukast 10 mg daily for now.  Continue developing the treatment plan with allergist and pulmonologist.   Osteoporosis without current pathological fracture, unspecified osteoporosis type Assessment & Plan: Discussed  the importance of calcium and vitamin D supplementation if discontinuing alendronate.  Patient would like to figure out her treatment plan with allergy and pulmonology before discussing alternatives for osteoporosis.  In the future, consider alternative bisphosphonate therapy and/or repeat DEXA as most recent scan was 06/04/2021.     Return in about 3 months (around 01/12/2024) for follow-up for osteoporosis, asthma/allergies.    Melida Quitter, PA

## 2023-10-12 NOTE — Patient Instructions (Signed)
I will be looking for the results of your testing with the allergist and with pulmonology.  We will touch base in about 3 months to see what your plan with them is and reevaluate what we might do about your bone health after that!

## 2023-10-13 NOTE — Telephone Encounter (Signed)
Called Dr Gladis Riffle office. Was advised that they can not send records until next wk when the referral coordinator returns  Will await records.

## 2023-10-28 DIAGNOSIS — J309 Allergic rhinitis, unspecified: Secondary | ICD-10-CM | POA: Diagnosis not present

## 2023-10-28 DIAGNOSIS — J4540 Moderate persistent asthma, uncomplicated: Secondary | ICD-10-CM | POA: Diagnosis not present

## 2023-10-31 NOTE — Telephone Encounter (Signed)
Dr. Judeth Horn, please advise if you ever received pt's records?

## 2023-11-02 NOTE — Telephone Encounter (Signed)
I can see them - thanks

## 2023-11-12 ENCOUNTER — Other Ambulatory Visit: Payer: Self-pay | Admitting: Family Medicine

## 2023-11-12 DIAGNOSIS — M810 Age-related osteoporosis without current pathological fracture: Secondary | ICD-10-CM

## 2023-11-17 DIAGNOSIS — H35371 Puckering of macula, right eye: Secondary | ICD-10-CM | POA: Diagnosis not present

## 2023-11-17 DIAGNOSIS — H18413 Arcus senilis, bilateral: Secondary | ICD-10-CM | POA: Diagnosis not present

## 2023-11-17 DIAGNOSIS — H2512 Age-related nuclear cataract, left eye: Secondary | ICD-10-CM | POA: Diagnosis not present

## 2023-11-17 DIAGNOSIS — H2513 Age-related nuclear cataract, bilateral: Secondary | ICD-10-CM | POA: Diagnosis not present

## 2023-11-17 DIAGNOSIS — H25043 Posterior subcapsular polar age-related cataract, bilateral: Secondary | ICD-10-CM | POA: Diagnosis not present

## 2023-11-21 DIAGNOSIS — Z23 Encounter for immunization: Secondary | ICD-10-CM | POA: Diagnosis not present

## 2023-12-07 ENCOUNTER — Ambulatory Visit: Payer: Medicare Other | Admitting: Pulmonary Disease

## 2023-12-07 ENCOUNTER — Encounter: Payer: Self-pay | Admitting: Pulmonary Disease

## 2023-12-07 VITALS — BP 135/82 | HR 54 | Temp 98.5°F | Ht 65.0 in | Wt 163.2 lb

## 2023-12-07 DIAGNOSIS — J9801 Acute bronchospasm: Secondary | ICD-10-CM

## 2023-12-07 DIAGNOSIS — J4550 Severe persistent asthma, uncomplicated: Secondary | ICD-10-CM

## 2023-12-07 DIAGNOSIS — J45901 Unspecified asthma with (acute) exacerbation: Secondary | ICD-10-CM | POA: Diagnosis not present

## 2023-12-07 MED ORDER — IPRATROPIUM-ALBUTEROL 0.5-2.5 (3) MG/3ML IN SOLN: 3.0000 mL | Freq: Four times a day (QID) | RESPIRATORY_TRACT | 3 refills | Status: DC | PRN

## 2023-12-07 NOTE — Progress Notes (Signed)
@Patient  ID: Veronica Barrett, female    DOB: Jun 10, 1951, 73 y.o.  MRN: [id number]  Chief Complaint  Patient presents with   Follow-up    Asthma f/u    Referring provider: Melida Quitter, PA  HPI:   73 y.o. woman whom we are seeing in follow up for evaluation of dyspnea on exertion felt to be related to asthma.   MRN: [id number]  Chief Complaint  Patient presents with   Follow-up    Asthma f/u    Referring provider: Melida Quitter, PA  HPI:   73 y.o. woman whom we are seeing in follow up for evaluation of dyspnea on exertion felt to be related to asthma.  Most recent PCP note x 2 reviewed.  Allergy and immunology note reviewed.  Symptoms of cough shortness of breath etc. lingered.  Actually got worse after last seeing me.  2 rounds of prednisone..  Additional round of antibiotics plus prednisone when saw allergist 1 month ago.  Switch from Advair to [facility].  Things have gotten better.  Improved.  HPI at initial visit: Patient hospitalized earlier this month with wheezing, bronchitis.  She may treat with outpatient courses of prednisone as well as antibiotics.  Despite this her symptoms of not significant proved.  Mildly worsened.  There is mention of hypoxemia although I do not see any documented true hypoxemia, saturations low 90s on room air in the ED.  She was placed on oxygen supplementation during hospitalization.  She was put on higher dose steroids in the hospital and gradually her breathing and wheezing improved.  She was discharged on steroid taper, Dulera mid dose, Advair mid dose, DuoNebs although she has not used them, and rescue inhaler.  She feels her rescue inhaler use is decreased with time.  Overall her breathing has improved with time.  She took her last dose of prednisone today.  She continues have dyspnea on exertion although again improving.  Cough is improving as well.  Productive of gray sputum.  No time during day when things are better or worse.  No relieving exacerbating factors other albuterol seems to help some.  Reports being diagnosed with a touch of asthma about 10 years ago.  Endorses seasonal allergies, runny nose, itchy eyes.  Uses Zyrtec daily.  Asthma runs in the family.  Reviewed at length serial  chest imaging on my interpretation 06/09/2021 demonstrates small retrocardiac opacity with what appears to be correlate in the base of the left lung along the posterior spine.  Subsequent chest x-ray about a week later 06/2021 with improvement in both retrocardiac opacity and correlate on the lateral film.  Reviewed rib x-ray 06/21/2021 that did not demonstrate rib fracture on review of results.  PMH: Seasonal allergies, asthma Surgical history: Appendectomy, hip replacement Family history: Thyroid cancer in sister, father had other form of cancer, no respiratory illness in first relatives that she can recall Social history: She is a never smoker, lives in [place], near [facility], retired   Public house manager / Pulmonary Flowsheets:   ACT:  Asthma Control Test ACT Total Score  12/07/2023  2:11 PM 25  12/26/2021  9:56 AM 24  09/25/2021 10:02 AM 23    MMRC:     No data to display          Epworth:      No data to display          Tests:   FENO:  No results found for: "NITRICOXIDE"  PFT:     No data to display          WALK:      No data to display          Imaging: Personally reviewed as per EMR discussion in this note No results found.  Lab Results: Personally reviewed, notably eosinophils  no higher than 200 in the past CBC    Component Value Date/Time   WBC 6.8 08/10/2023 1032   WBC 8.5 06/20/2021 0430   RBC 4.14 08/10/2023 1032   RBC 4.14 06/20/2021 0430   HGB 12.9 08/10/2023 1032   HCT 39.3 08/10/2023 1032   PLT 366 08/10/2023 1032   MCV 95 08/10/2023 1032   MCH 31.2 08/10/2023 1032   MCH 30.7 06/20/2021 0430   MCHC 32.8 08/10/2023 1032   MCHC 32.1 06/20/2021 0430   RDW 11.8 08/10/2023 1032   LYMPHSABS 1.6 08/10/2023 1032   MONOABS 0.4 06/20/2021 0430   EOSABS 1.2 (H) 08/10/2023 1032   BASOSABS 0.0 08/10/2023 1032    BMET    Component Value Date/Time   NA 142 08/10/2023 1032   K 4.2 08/10/2023 1032   CL 102 08/10/2023 1032   CO2 22  08/10/2023 1032   GLUCOSE 86 08/10/2023 1032   GLUCOSE 104 (H) 06/19/2021 0408   BUN 14 08/10/2023 1032   CREATININE 0.67 08/10/2023 1032   CALCIUM 9.5 08/10/2023 1032   GFRNONAA >60 06/19/2021 0408   GFRAA 103 02/14/2020 1000    BNP    Component Value Date/Time   BNP 37.4 06/20/2021 0430    ProBNP No results found for: "PROBNP"  Specialty Problems       Pulmonary Problems   Allergic rhinitis   Qualifier: Diagnosis of  By: Rosette Reveal RN, Jorene Minors       Chronic sinusitis   Bronchitis   Moderate persistent asthma    Allergies  Allergen Reactions   Seasonal Ic [Cholestatin]    Tape Rash    Immunization History  Administered Date(s) Administered   Influenza Whole 08/22/2009, 09/19/2010   Influenza, High Dose Seasonal PF 08/31/2018, 09/19/2019, 09/10/2022   Influenza,inj,Quad PF,6+ Mos 10/02/2014   Influenza-Unspecified 08/17/2014   PFIZER(Purple Top)SARS-COV-2 Vaccination 12/25/2019, 01/19/2020   Pneumococcal Conjugate-13 08/31/2018   Td 10/27/2000   Tdap 04/21/2013, 02/12/2023    Past Medical History:  Diagnosis Date   Allergy    seasonal allergy   Arthritis    left hip   Asthma    Depression    HSV infection    vag    Tobacco History: Social History   Tobacco Use  Smoking Status Never   Passive exposure: Never  Smokeless Tobacco Never   Counseling given: Not Answered    Continue to not smoke  Outpatient Encounter Medications as of 12/07/2023  Medication Sig   acyclovir (ZOVIRAX) 200 MG capsule TAKE 1 CAPSULE BY MOUTH 5 TIMES DAILY AS NEEDED FOR BREAKOUTS   albuterol (VENTOLIN HFA) 108 (90 Base) MCG/ACT inhaler Inhale 2 puffs into the lungs every 6 (six) hours as needed for wheezing or shortness of breath.   atorvastatin (LIPITOR) 20 MG tablet Take 1 tablet (20 mg total) by mouth daily.   cetirizine (ZYRTEC) 10 MG tablet Take 10 mg by mouth daily.   citalopram (CELEXA) 20 MG tablet TAKE 1 AND 1/2 TABLETS BY MOUTH EVERY DAY    mometasone-formoterol (DULERA) 200-5 MCG/ACT AERO Inhale 2 puffs into the lungs 2 (two) times daily.   montelukast (SINGULAIR) 10 MG tablet Take 1 tablet (10 mg total) by mouth at bedtime.   Nebulizer System All-In-One MISC Use with nebulized albuterol once every 4-6 hours for wheezing and shortness of breath.   pantoprazole (PROTONIX) 40 MG tablet Take 1 tablet (40 mg total) by mouth daily.   Respiratory Therapy Supplies (NEBULIZER MASK ADULT) MISC 1 Inhaler by Does not  apply route every 4 (four) hours as needed.   [DISCONTINUED] ipratropium-albuterol (DUONEB) 0.5-2.5 (3) MG/3ML SOLN Take 3 mLs by nebulization every 6 (six) hours as needed (shortness of breath, cough or wheeze).   ipratropium-albuterol (DUONEB) 0.5-2.5 (3) MG/3ML SOLN Take 3 mLs by nebulization every 6 (six) hours as needed (shortness of breath, cough or wheeze).   No facility-administered encounter medications on file as of 12/07/2023.     Review of Systems  Review of Systems  N/a Physical Exam  BP 135/82 (BP Location: Left Arm, Patient Position: Sitting, Cuff Size: Normal)   Pulse (!) 54   Temp 98.5 F (36.9 C) (Oral)   Ht 5\' 5"  (1.651 m)   Wt 163 lb 3.2 oz (74 kg)   SpO2 97%   BMI 27.16 kg/m   Wt Readings from Last 5 Encounters:  12/07/23 163 lb 3.2 oz (74 kg)  10/12/23 163 lb (73.9 kg)  08/10/23 169 lb (76.7 kg)  07/13/23 164 lb 9.6 oz (74.7 kg)  03/26/23 162 lb (73.5 kg)    BMI Readings from Last 5 Encounters:  12/07/23 27.16 kg/m  10/12/23 27.49 kg/m  08/10/23 28.12 kg/m  07/13/23 27.39 kg/m  03/26/23 26.96 kg/m     Physical Exam General: Well-appearing, no acute distress Eyes: EOMI, no icterus Neck: Supple, no JVP Cardiovascular: Regular rate and rhythm, no murmurs Pulmonary: Clear, normal breathing on room air   Assessment & Plan:   Dyspnea on exertion:  Briefly hospitalized for severe bronchitis.  Possible small pneumonia on chest x-ray on my interpretation although not called by  radiology.  Suspect triggered quiescent asthma given ongoing symptoms of wheeze on exam.  She has atopic symptoms.  Dyspnea essentially resolved, exercising regularly, De-escalated high-dose Advair HFA to mid dose diskus 11/22.  Then became again dysregulated late summer, early fall 2024.  Improved with multiple rounds of antibiotics and prednisone, switch to [facility].  CT scan ordered for further evaluation.  Asthma with exacerbation: Atopic symptoms, wheezing, prolonged bronchitis responsive to steroids including hospitalization 06/2021.  Dyspnea improved with high-dose Advair HFA.  De-escalated to mid dose Advair diskus.  Symptoms were well-controlled and ran out of inhaler over the last several weeks prior to decompensation fall 2024.  Currently on Dulera.  Stable for now.  To continue.  Would not de-escalate anything until we get through next fall which is her typical difficult season.   Return in about 3 months (around 03/06/2024) for f/u Dr. Judeth Horn, after CT scan.   Karren Burly, MD 12/07/2023

## 2023-12-07 NOTE — Patient Instructions (Signed)
Changes to medication  Continue Dulera 2 puffs twice a day  Continue montelukast 1 tablet nightly  I sent a supply nebulizer solution to be used as needed  I ordered a CT scan to evaluate any abnormality that may cause such prolonged symptoms  Return to clinic in 3 months or sooner as needed with Dr. Judeth Horn

## 2023-12-09 ENCOUNTER — Other Ambulatory Visit: Payer: Self-pay | Admitting: Family Medicine

## 2023-12-09 DIAGNOSIS — B009 Herpesviral infection, unspecified: Secondary | ICD-10-CM

## 2023-12-11 ENCOUNTER — Other Ambulatory Visit (HOSPITAL_COMMUNITY): Payer: Self-pay

## 2023-12-11 ENCOUNTER — Telehealth: Payer: Self-pay

## 2023-12-11 NOTE — Telephone Encounter (Signed)
Pharmacy Patient Advocate Encounter   Received notification from Patient Pharmacy that prior authorization for Ipratropium / Albuterol soln is required/requested.   Insurance verification completed.   The patient is insured through [employer] .   Per test claim: This medication is to be processed through Part B as it is a plan exclusion for Part D

## 2023-12-24 ENCOUNTER — Encounter: Payer: Self-pay | Admitting: Family Medicine

## 2024-01-06 ENCOUNTER — Other Ambulatory Visit: Payer: Medicare Other

## 2024-01-09 DIAGNOSIS — W19XXXA Unspecified fall, initial encounter: Secondary | ICD-10-CM | POA: Diagnosis not present

## 2024-01-09 DIAGNOSIS — S8002XA Contusion of left knee, initial encounter: Secondary | ICD-10-CM | POA: Diagnosis not present

## 2024-01-09 DIAGNOSIS — R0689 Other abnormalities of breathing: Secondary | ICD-10-CM | POA: Diagnosis not present

## 2024-01-10 DIAGNOSIS — Z79899 Other long term (current) drug therapy: Secondary | ICD-10-CM | POA: Diagnosis not present

## 2024-01-10 DIAGNOSIS — S72332D Displaced oblique fracture of shaft of left femur, subsequent encounter for closed fracture with routine healing: Secondary | ICD-10-CM | POA: Insufficient documentation

## 2024-01-10 DIAGNOSIS — Z96642 Presence of left artificial hip joint: Secondary | ICD-10-CM | POA: Diagnosis not present

## 2024-01-10 DIAGNOSIS — S72402A Unspecified fracture of lower end of left femur, initial encounter for closed fracture: Secondary | ICD-10-CM | POA: Diagnosis not present

## 2024-01-10 DIAGNOSIS — M25562 Pain in left knee: Secondary | ICD-10-CM | POA: Diagnosis not present

## 2024-01-10 DIAGNOSIS — F32A Depression, unspecified: Secondary | ICD-10-CM | POA: Diagnosis not present

## 2024-01-10 DIAGNOSIS — J4540 Moderate persistent asthma, uncomplicated: Secondary | ICD-10-CM | POA: Diagnosis not present

## 2024-01-10 DIAGNOSIS — M85852 Other specified disorders of bone density and structure, left thigh: Secondary | ICD-10-CM | POA: Diagnosis not present

## 2024-01-10 DIAGNOSIS — D62 Acute posthemorrhagic anemia: Secondary | ICD-10-CM | POA: Diagnosis not present

## 2024-01-10 DIAGNOSIS — E785 Hyperlipidemia, unspecified: Secondary | ICD-10-CM | POA: Diagnosis not present

## 2024-01-10 DIAGNOSIS — M80052D Age-related osteoporosis with current pathological fracture, left femur, subsequent encounter for fracture with routine healing: Secondary | ICD-10-CM | POA: Diagnosis not present

## 2024-01-10 DIAGNOSIS — M80052A Age-related osteoporosis with current pathological fracture, left femur, initial encounter for fracture: Secondary | ICD-10-CM | POA: Diagnosis not present

## 2024-01-10 DIAGNOSIS — S72302A Unspecified fracture of shaft of left femur, initial encounter for closed fracture: Secondary | ICD-10-CM | POA: Diagnosis not present

## 2024-01-10 DIAGNOSIS — R9431 Abnormal electrocardiogram [ECG] [EKG]: Secondary | ICD-10-CM | POA: Diagnosis not present

## 2024-01-10 DIAGNOSIS — S72332A Displaced oblique fracture of shaft of left femur, initial encounter for closed fracture: Secondary | ICD-10-CM | POA: Diagnosis not present

## 2024-01-10 DIAGNOSIS — J3089 Other allergic rhinitis: Secondary | ICD-10-CM | POA: Diagnosis not present

## 2024-01-10 HISTORY — PX: LEG SURGERY: SHX1003

## 2024-01-11 DIAGNOSIS — M80052D Age-related osteoporosis with current pathological fracture, left femur, subsequent encounter for fracture with routine healing: Secondary | ICD-10-CM | POA: Insufficient documentation

## 2024-01-12 ENCOUNTER — Ambulatory Visit: Payer: Medicare Other | Admitting: Family Medicine

## 2024-01-12 DIAGNOSIS — M7989 Other specified soft tissue disorders: Secondary | ICD-10-CM | POA: Diagnosis not present

## 2024-01-12 DIAGNOSIS — Z79899 Other long term (current) drug therapy: Secondary | ICD-10-CM | POA: Diagnosis not present

## 2024-01-12 DIAGNOSIS — D62 Acute posthemorrhagic anemia: Secondary | ICD-10-CM | POA: Insufficient documentation

## 2024-01-12 DIAGNOSIS — F339 Major depressive disorder, recurrent, unspecified: Secondary | ICD-10-CM | POA: Diagnosis not present

## 2024-01-12 DIAGNOSIS — J4540 Moderate persistent asthma, uncomplicated: Secondary | ICD-10-CM | POA: Diagnosis not present

## 2024-01-12 DIAGNOSIS — S72332D Displaced oblique fracture of shaft of left femur, subsequent encounter for closed fracture with routine healing: Secondary | ICD-10-CM | POA: Diagnosis not present

## 2024-01-12 DIAGNOSIS — Z9189 Other specified personal risk factors, not elsewhere classified: Secondary | ICD-10-CM | POA: Insufficient documentation

## 2024-01-12 DIAGNOSIS — M80052D Age-related osteoporosis with current pathological fracture, left femur, subsequent encounter for fracture with routine healing: Secondary | ICD-10-CM | POA: Diagnosis not present

## 2024-01-12 DIAGNOSIS — J3089 Other allergic rhinitis: Secondary | ICD-10-CM | POA: Diagnosis not present

## 2024-01-12 DIAGNOSIS — E785 Hyperlipidemia, unspecified: Secondary | ICD-10-CM | POA: Diagnosis not present

## 2024-01-12 DIAGNOSIS — S7292XA Unspecified fracture of left femur, initial encounter for closed fracture: Secondary | ICD-10-CM | POA: Diagnosis not present

## 2024-01-12 DIAGNOSIS — S72332A Displaced oblique fracture of shaft of left femur, initial encounter for closed fracture: Secondary | ICD-10-CM | POA: Diagnosis not present

## 2024-01-13 DIAGNOSIS — S72332D Displaced oblique fracture of shaft of left femur, subsequent encounter for closed fracture with routine healing: Secondary | ICD-10-CM | POA: Diagnosis not present

## 2024-01-14 ENCOUNTER — Other Ambulatory Visit: Payer: Medicare Other

## 2024-01-14 DIAGNOSIS — S72332D Displaced oblique fracture of shaft of left femur, subsequent encounter for closed fracture with routine healing: Secondary | ICD-10-CM | POA: Diagnosis not present

## 2024-01-15 DIAGNOSIS — S72332D Displaced oblique fracture of shaft of left femur, subsequent encounter for closed fracture with routine healing: Secondary | ICD-10-CM | POA: Diagnosis not present

## 2024-01-18 DIAGNOSIS — S72332D Displaced oblique fracture of shaft of left femur, subsequent encounter for closed fracture with routine healing: Secondary | ICD-10-CM | POA: Diagnosis not present

## 2024-01-19 DIAGNOSIS — S72332D Displaced oblique fracture of shaft of left femur, subsequent encounter for closed fracture with routine healing: Secondary | ICD-10-CM | POA: Diagnosis not present

## 2024-01-20 DIAGNOSIS — M7989 Other specified soft tissue disorders: Secondary | ICD-10-CM | POA: Diagnosis not present

## 2024-01-20 DIAGNOSIS — S72332D Displaced oblique fracture of shaft of left femur, subsequent encounter for closed fracture with routine healing: Secondary | ICD-10-CM | POA: Diagnosis not present

## 2024-01-20 DIAGNOSIS — S7292XA Unspecified fracture of left femur, initial encounter for closed fracture: Secondary | ICD-10-CM | POA: Diagnosis not present

## 2024-01-21 DIAGNOSIS — S72332D Displaced oblique fracture of shaft of left femur, subsequent encounter for closed fracture with routine healing: Secondary | ICD-10-CM | POA: Diagnosis not present

## 2024-01-22 DIAGNOSIS — S72332D Displaced oblique fracture of shaft of left femur, subsequent encounter for closed fracture with routine healing: Secondary | ICD-10-CM | POA: Diagnosis not present

## 2024-01-23 DIAGNOSIS — S72332D Displaced oblique fracture of shaft of left femur, subsequent encounter for closed fracture with routine healing: Secondary | ICD-10-CM | POA: Diagnosis not present

## 2024-01-24 DIAGNOSIS — S72332D Displaced oblique fracture of shaft of left femur, subsequent encounter for closed fracture with routine healing: Secondary | ICD-10-CM | POA: Diagnosis not present

## 2024-01-25 ENCOUNTER — Telehealth: Payer: Self-pay

## 2024-01-25 DIAGNOSIS — S72332D Displaced oblique fracture of shaft of left femur, subsequent encounter for closed fracture with routine healing: Secondary | ICD-10-CM | POA: Diagnosis not present

## 2024-01-25 NOTE — Telephone Encounter (Signed)
 Copied from CRM [id number]. Topic: General - Other >> Jan 25, 2024 11:06 AM Eunice Blase wrote: Reason for CRM: Pt needs earlier follow for hospital follow up call pt at [phone]

## 2024-01-26 DIAGNOSIS — S72332D Displaced oblique fracture of shaft of left femur, subsequent encounter for closed fracture with routine healing: Secondary | ICD-10-CM | POA: Diagnosis not present

## 2024-01-26 NOTE — Telephone Encounter (Signed)
 Contacted pt and appt scheduled for this Friday.

## 2024-01-29 ENCOUNTER — Inpatient Hospital Stay: Admitting: Family Medicine

## 2024-01-29 DIAGNOSIS — Z7951 Long term (current) use of inhaled steroids: Secondary | ICD-10-CM | POA: Diagnosis not present

## 2024-01-29 DIAGNOSIS — E7800 Pure hypercholesterolemia, unspecified: Secondary | ICD-10-CM | POA: Diagnosis not present

## 2024-01-29 DIAGNOSIS — F32A Depression, unspecified: Secondary | ICD-10-CM | POA: Diagnosis not present

## 2024-01-29 DIAGNOSIS — J449 Chronic obstructive pulmonary disease, unspecified: Secondary | ICD-10-CM | POA: Diagnosis not present

## 2024-01-29 DIAGNOSIS — M80052D Age-related osteoporosis with current pathological fracture, left femur, subsequent encounter for fracture with routine healing: Secondary | ICD-10-CM | POA: Diagnosis not present

## 2024-01-29 DIAGNOSIS — K219 Gastro-esophageal reflux disease without esophagitis: Secondary | ICD-10-CM | POA: Diagnosis not present

## 2024-01-29 DIAGNOSIS — K5900 Constipation, unspecified: Secondary | ICD-10-CM | POA: Diagnosis not present

## 2024-01-29 DIAGNOSIS — Z87440 Personal history of urinary (tract) infections: Secondary | ICD-10-CM | POA: Diagnosis not present

## 2024-01-29 DIAGNOSIS — M1990 Unspecified osteoarthritis, unspecified site: Secondary | ICD-10-CM | POA: Diagnosis not present

## 2024-01-29 DIAGNOSIS — Z604 Social exclusion and rejection: Secondary | ICD-10-CM | POA: Diagnosis not present

## 2024-01-29 DIAGNOSIS — J4540 Moderate persistent asthma, uncomplicated: Secondary | ICD-10-CM | POA: Diagnosis not present

## 2024-01-29 DIAGNOSIS — I82409 Acute embolism and thrombosis of unspecified deep veins of unspecified lower extremity: Secondary | ICD-10-CM | POA: Diagnosis not present

## 2024-01-29 DIAGNOSIS — D62 Acute posthemorrhagic anemia: Secondary | ICD-10-CM | POA: Diagnosis not present

## 2024-01-29 DIAGNOSIS — Z7982 Long term (current) use of aspirin: Secondary | ICD-10-CM | POA: Diagnosis not present

## 2024-01-31 ENCOUNTER — Other Ambulatory Visit: Payer: Self-pay | Admitting: Family Medicine

## 2024-01-31 DIAGNOSIS — F339 Major depressive disorder, recurrent, unspecified: Secondary | ICD-10-CM

## 2024-02-01 ENCOUNTER — Telehealth: Payer: Self-pay | Admitting: *Deleted

## 2024-02-01 DIAGNOSIS — K219 Gastro-esophageal reflux disease without esophagitis: Secondary | ICD-10-CM | POA: Diagnosis not present

## 2024-02-01 DIAGNOSIS — F32A Depression, unspecified: Secondary | ICD-10-CM | POA: Diagnosis not present

## 2024-02-01 DIAGNOSIS — M80052D Age-related osteoporosis with current pathological fracture, left femur, subsequent encounter for fracture with routine healing: Secondary | ICD-10-CM | POA: Diagnosis not present

## 2024-02-01 DIAGNOSIS — J4540 Moderate persistent asthma, uncomplicated: Secondary | ICD-10-CM | POA: Diagnosis not present

## 2024-02-01 DIAGNOSIS — I82409 Acute embolism and thrombosis of unspecified deep veins of unspecified lower extremity: Secondary | ICD-10-CM | POA: Diagnosis not present

## 2024-02-01 DIAGNOSIS — J449 Chronic obstructive pulmonary disease, unspecified: Secondary | ICD-10-CM | POA: Diagnosis not present

## 2024-02-01 NOTE — Telephone Encounter (Signed)
 Verbal given to [facility] with [facility].

## 2024-02-01 NOTE — Telephone Encounter (Signed)
 Copied from CRM [id number]. Topic: Clinical - Home Health Verbal Orders >> Feb 01, 2024  1:09 PM Carlatta H wrote: Caller/Agency: Cindee Salt Number: [phone] Service Requested: Skilled Nursing Frequency: 1 week 4/96month 1 and 2 prns Any new concerns about the patient? No

## 2024-02-02 DIAGNOSIS — J449 Chronic obstructive pulmonary disease, unspecified: Secondary | ICD-10-CM | POA: Diagnosis not present

## 2024-02-02 DIAGNOSIS — F32A Depression, unspecified: Secondary | ICD-10-CM | POA: Diagnosis not present

## 2024-02-02 DIAGNOSIS — K219 Gastro-esophageal reflux disease without esophagitis: Secondary | ICD-10-CM | POA: Diagnosis not present

## 2024-02-02 DIAGNOSIS — M80052D Age-related osteoporosis with current pathological fracture, left femur, subsequent encounter for fracture with routine healing: Secondary | ICD-10-CM | POA: Diagnosis not present

## 2024-02-02 DIAGNOSIS — J4540 Moderate persistent asthma, uncomplicated: Secondary | ICD-10-CM | POA: Diagnosis not present

## 2024-02-02 DIAGNOSIS — I82409 Acute embolism and thrombosis of unspecified deep veins of unspecified lower extremity: Secondary | ICD-10-CM | POA: Diagnosis not present

## 2024-02-03 DIAGNOSIS — S72302D Unspecified fracture of shaft of left femur, subsequent encounter for closed fracture with routine healing: Secondary | ICD-10-CM | POA: Diagnosis not present

## 2024-02-03 DIAGNOSIS — Z4789 Encounter for other orthopedic aftercare: Secondary | ICD-10-CM | POA: Diagnosis not present

## 2024-02-06 DIAGNOSIS — J449 Chronic obstructive pulmonary disease, unspecified: Secondary | ICD-10-CM | POA: Diagnosis not present

## 2024-02-06 DIAGNOSIS — F32A Depression, unspecified: Secondary | ICD-10-CM | POA: Diagnosis not present

## 2024-02-06 DIAGNOSIS — K219 Gastro-esophageal reflux disease without esophagitis: Secondary | ICD-10-CM | POA: Diagnosis not present

## 2024-02-06 DIAGNOSIS — M80052D Age-related osteoporosis with current pathological fracture, left femur, subsequent encounter for fracture with routine healing: Secondary | ICD-10-CM | POA: Diagnosis not present

## 2024-02-06 DIAGNOSIS — I82409 Acute embolism and thrombosis of unspecified deep veins of unspecified lower extremity: Secondary | ICD-10-CM | POA: Diagnosis not present

## 2024-02-06 DIAGNOSIS — J4540 Moderate persistent asthma, uncomplicated: Secondary | ICD-10-CM | POA: Diagnosis not present

## 2024-02-08 ENCOUNTER — Telehealth: Payer: Self-pay | Admitting: *Deleted

## 2024-02-08 DIAGNOSIS — M80052D Age-related osteoporosis with current pathological fracture, left femur, subsequent encounter for fracture with routine healing: Secondary | ICD-10-CM | POA: Diagnosis not present

## 2024-02-08 DIAGNOSIS — J449 Chronic obstructive pulmonary disease, unspecified: Secondary | ICD-10-CM | POA: Diagnosis not present

## 2024-02-08 DIAGNOSIS — K219 Gastro-esophageal reflux disease without esophagitis: Secondary | ICD-10-CM | POA: Diagnosis not present

## 2024-02-08 DIAGNOSIS — F32A Depression, unspecified: Secondary | ICD-10-CM | POA: Diagnosis not present

## 2024-02-08 DIAGNOSIS — J4540 Moderate persistent asthma, uncomplicated: Secondary | ICD-10-CM | POA: Diagnosis not present

## 2024-02-08 DIAGNOSIS — I82409 Acute embolism and thrombosis of unspecified deep veins of unspecified lower extremity: Secondary | ICD-10-CM | POA: Diagnosis not present

## 2024-02-08 NOTE — Telephone Encounter (Signed)
 Copied from CRM [id number]. Topic: General - Other >> Feb 08, 2024 11:19 AM Emylou G wrote: Reason for CRM: moira w/[facility] Verbal Orders for OT & PT for today??  once a week for 8 weeks OT and twice a week for 8 weeks for PT.. pls call her [phone] secure line if you need to leave vmail.Marland Kitchen

## 2024-02-08 NOTE — Telephone Encounter (Signed)
 Verbal given to [facility].

## 2024-02-09 ENCOUNTER — Inpatient Hospital Stay: Admitting: Family Medicine

## 2024-02-11 DIAGNOSIS — M80052D Age-related osteoporosis with current pathological fracture, left femur, subsequent encounter for fracture with routine healing: Secondary | ICD-10-CM | POA: Diagnosis not present

## 2024-02-11 DIAGNOSIS — F32A Depression, unspecified: Secondary | ICD-10-CM | POA: Diagnosis not present

## 2024-02-11 DIAGNOSIS — K219 Gastro-esophageal reflux disease without esophagitis: Secondary | ICD-10-CM | POA: Diagnosis not present

## 2024-02-11 DIAGNOSIS — J449 Chronic obstructive pulmonary disease, unspecified: Secondary | ICD-10-CM | POA: Diagnosis not present

## 2024-02-11 DIAGNOSIS — J4540 Moderate persistent asthma, uncomplicated: Secondary | ICD-10-CM | POA: Diagnosis not present

## 2024-02-11 DIAGNOSIS — I82409 Acute embolism and thrombosis of unspecified deep veins of unspecified lower extremity: Secondary | ICD-10-CM | POA: Diagnosis not present

## 2024-02-12 DIAGNOSIS — M80052D Age-related osteoporosis with current pathological fracture, left femur, subsequent encounter for fracture with routine healing: Secondary | ICD-10-CM | POA: Diagnosis not present

## 2024-02-12 DIAGNOSIS — I82409 Acute embolism and thrombosis of unspecified deep veins of unspecified lower extremity: Secondary | ICD-10-CM | POA: Diagnosis not present

## 2024-02-12 DIAGNOSIS — J449 Chronic obstructive pulmonary disease, unspecified: Secondary | ICD-10-CM | POA: Diagnosis not present

## 2024-02-12 DIAGNOSIS — F32A Depression, unspecified: Secondary | ICD-10-CM | POA: Diagnosis not present

## 2024-02-12 DIAGNOSIS — J4540 Moderate persistent asthma, uncomplicated: Secondary | ICD-10-CM | POA: Diagnosis not present

## 2024-02-12 DIAGNOSIS — K219 Gastro-esophageal reflux disease without esophagitis: Secondary | ICD-10-CM | POA: Diagnosis not present

## 2024-02-13 DIAGNOSIS — I82409 Acute embolism and thrombosis of unspecified deep veins of unspecified lower extremity: Secondary | ICD-10-CM | POA: Diagnosis not present

## 2024-02-13 DIAGNOSIS — K219 Gastro-esophageal reflux disease without esophagitis: Secondary | ICD-10-CM | POA: Diagnosis not present

## 2024-02-13 DIAGNOSIS — J4540 Moderate persistent asthma, uncomplicated: Secondary | ICD-10-CM | POA: Diagnosis not present

## 2024-02-13 DIAGNOSIS — F32A Depression, unspecified: Secondary | ICD-10-CM | POA: Diagnosis not present

## 2024-02-13 DIAGNOSIS — J449 Chronic obstructive pulmonary disease, unspecified: Secondary | ICD-10-CM | POA: Diagnosis not present

## 2024-02-13 DIAGNOSIS — M80052D Age-related osteoporosis with current pathological fracture, left femur, subsequent encounter for fracture with routine healing: Secondary | ICD-10-CM | POA: Diagnosis not present

## 2024-02-15 DIAGNOSIS — F32A Depression, unspecified: Secondary | ICD-10-CM | POA: Diagnosis not present

## 2024-02-15 DIAGNOSIS — J449 Chronic obstructive pulmonary disease, unspecified: Secondary | ICD-10-CM | POA: Diagnosis not present

## 2024-02-15 DIAGNOSIS — M80052D Age-related osteoporosis with current pathological fracture, left femur, subsequent encounter for fracture with routine healing: Secondary | ICD-10-CM | POA: Diagnosis not present

## 2024-02-15 DIAGNOSIS — I82409 Acute embolism and thrombosis of unspecified deep veins of unspecified lower extremity: Secondary | ICD-10-CM | POA: Diagnosis not present

## 2024-02-15 DIAGNOSIS — K219 Gastro-esophageal reflux disease without esophagitis: Secondary | ICD-10-CM | POA: Diagnosis not present

## 2024-02-15 DIAGNOSIS — J4540 Moderate persistent asthma, uncomplicated: Secondary | ICD-10-CM | POA: Diagnosis not present

## 2024-02-17 DIAGNOSIS — I82409 Acute embolism and thrombosis of unspecified deep veins of unspecified lower extremity: Secondary | ICD-10-CM | POA: Diagnosis not present

## 2024-02-17 DIAGNOSIS — M80052D Age-related osteoporosis with current pathological fracture, left femur, subsequent encounter for fracture with routine healing: Secondary | ICD-10-CM | POA: Diagnosis not present

## 2024-02-17 DIAGNOSIS — F32A Depression, unspecified: Secondary | ICD-10-CM | POA: Diagnosis not present

## 2024-02-17 DIAGNOSIS — K219 Gastro-esophageal reflux disease without esophagitis: Secondary | ICD-10-CM | POA: Diagnosis not present

## 2024-02-17 DIAGNOSIS — J449 Chronic obstructive pulmonary disease, unspecified: Secondary | ICD-10-CM | POA: Diagnosis not present

## 2024-02-17 DIAGNOSIS — J4540 Moderate persistent asthma, uncomplicated: Secondary | ICD-10-CM | POA: Diagnosis not present

## 2024-02-18 DIAGNOSIS — M80052D Age-related osteoporosis with current pathological fracture, left femur, subsequent encounter for fracture with routine healing: Secondary | ICD-10-CM | POA: Diagnosis not present

## 2024-02-18 DIAGNOSIS — J449 Chronic obstructive pulmonary disease, unspecified: Secondary | ICD-10-CM | POA: Diagnosis not present

## 2024-02-18 DIAGNOSIS — J4540 Moderate persistent asthma, uncomplicated: Secondary | ICD-10-CM | POA: Diagnosis not present

## 2024-02-18 DIAGNOSIS — I82409 Acute embolism and thrombosis of unspecified deep veins of unspecified lower extremity: Secondary | ICD-10-CM | POA: Diagnosis not present

## 2024-02-18 DIAGNOSIS — F32A Depression, unspecified: Secondary | ICD-10-CM | POA: Diagnosis not present

## 2024-02-18 DIAGNOSIS — K219 Gastro-esophageal reflux disease without esophagitis: Secondary | ICD-10-CM | POA: Diagnosis not present

## 2024-02-19 DIAGNOSIS — M80052D Age-related osteoporosis with current pathological fracture, left femur, subsequent encounter for fracture with routine healing: Secondary | ICD-10-CM | POA: Diagnosis not present

## 2024-02-19 DIAGNOSIS — I82409 Acute embolism and thrombosis of unspecified deep veins of unspecified lower extremity: Secondary | ICD-10-CM | POA: Diagnosis not present

## 2024-02-19 DIAGNOSIS — J449 Chronic obstructive pulmonary disease, unspecified: Secondary | ICD-10-CM | POA: Diagnosis not present

## 2024-02-19 DIAGNOSIS — K219 Gastro-esophageal reflux disease without esophagitis: Secondary | ICD-10-CM | POA: Diagnosis not present

## 2024-02-19 DIAGNOSIS — F32A Depression, unspecified: Secondary | ICD-10-CM | POA: Diagnosis not present

## 2024-02-19 DIAGNOSIS — J4540 Moderate persistent asthma, uncomplicated: Secondary | ICD-10-CM | POA: Diagnosis not present

## 2024-02-22 DIAGNOSIS — I82409 Acute embolism and thrombosis of unspecified deep veins of unspecified lower extremity: Secondary | ICD-10-CM | POA: Diagnosis not present

## 2024-02-22 DIAGNOSIS — J449 Chronic obstructive pulmonary disease, unspecified: Secondary | ICD-10-CM | POA: Diagnosis not present

## 2024-02-22 DIAGNOSIS — M80052D Age-related osteoporosis with current pathological fracture, left femur, subsequent encounter for fracture with routine healing: Secondary | ICD-10-CM | POA: Diagnosis not present

## 2024-02-22 DIAGNOSIS — J4540 Moderate persistent asthma, uncomplicated: Secondary | ICD-10-CM | POA: Diagnosis not present

## 2024-02-22 DIAGNOSIS — F32A Depression, unspecified: Secondary | ICD-10-CM | POA: Diagnosis not present

## 2024-02-22 DIAGNOSIS — K219 Gastro-esophageal reflux disease without esophagitis: Secondary | ICD-10-CM | POA: Diagnosis not present

## 2024-02-24 DIAGNOSIS — J4540 Moderate persistent asthma, uncomplicated: Secondary | ICD-10-CM | POA: Diagnosis not present

## 2024-02-24 DIAGNOSIS — J449 Chronic obstructive pulmonary disease, unspecified: Secondary | ICD-10-CM | POA: Diagnosis not present

## 2024-02-24 DIAGNOSIS — M80052D Age-related osteoporosis with current pathological fracture, left femur, subsequent encounter for fracture with routine healing: Secondary | ICD-10-CM | POA: Diagnosis not present

## 2024-02-24 DIAGNOSIS — K219 Gastro-esophageal reflux disease without esophagitis: Secondary | ICD-10-CM | POA: Diagnosis not present

## 2024-02-24 DIAGNOSIS — I82409 Acute embolism and thrombosis of unspecified deep veins of unspecified lower extremity: Secondary | ICD-10-CM | POA: Diagnosis not present

## 2024-02-24 DIAGNOSIS — F32A Depression, unspecified: Secondary | ICD-10-CM | POA: Diagnosis not present

## 2024-02-26 DIAGNOSIS — J449 Chronic obstructive pulmonary disease, unspecified: Secondary | ICD-10-CM | POA: Diagnosis not present

## 2024-02-26 DIAGNOSIS — I82409 Acute embolism and thrombosis of unspecified deep veins of unspecified lower extremity: Secondary | ICD-10-CM | POA: Diagnosis not present

## 2024-02-26 DIAGNOSIS — F32A Depression, unspecified: Secondary | ICD-10-CM | POA: Diagnosis not present

## 2024-02-26 DIAGNOSIS — J4540 Moderate persistent asthma, uncomplicated: Secondary | ICD-10-CM | POA: Diagnosis not present

## 2024-02-26 DIAGNOSIS — K219 Gastro-esophageal reflux disease without esophagitis: Secondary | ICD-10-CM | POA: Diagnosis not present

## 2024-02-26 DIAGNOSIS — M80052D Age-related osteoporosis with current pathological fracture, left femur, subsequent encounter for fracture with routine healing: Secondary | ICD-10-CM | POA: Diagnosis not present

## 2024-02-28 DIAGNOSIS — J449 Chronic obstructive pulmonary disease, unspecified: Secondary | ICD-10-CM | POA: Diagnosis not present

## 2024-02-28 DIAGNOSIS — F32A Depression, unspecified: Secondary | ICD-10-CM | POA: Diagnosis not present

## 2024-02-28 DIAGNOSIS — K5900 Constipation, unspecified: Secondary | ICD-10-CM | POA: Diagnosis not present

## 2024-02-28 DIAGNOSIS — E7800 Pure hypercholesterolemia, unspecified: Secondary | ICD-10-CM | POA: Diagnosis not present

## 2024-02-28 DIAGNOSIS — K219 Gastro-esophageal reflux disease without esophagitis: Secondary | ICD-10-CM | POA: Diagnosis not present

## 2024-02-28 DIAGNOSIS — M1990 Unspecified osteoarthritis, unspecified site: Secondary | ICD-10-CM | POA: Diagnosis not present

## 2024-02-28 DIAGNOSIS — Z87440 Personal history of urinary (tract) infections: Secondary | ICD-10-CM | POA: Diagnosis not present

## 2024-02-28 DIAGNOSIS — M80052D Age-related osteoporosis with current pathological fracture, left femur, subsequent encounter for fracture with routine healing: Secondary | ICD-10-CM | POA: Diagnosis not present

## 2024-02-28 DIAGNOSIS — Z604 Social exclusion and rejection: Secondary | ICD-10-CM | POA: Diagnosis not present

## 2024-02-28 DIAGNOSIS — J4540 Moderate persistent asthma, uncomplicated: Secondary | ICD-10-CM | POA: Diagnosis not present

## 2024-02-28 DIAGNOSIS — I82409 Acute embolism and thrombosis of unspecified deep veins of unspecified lower extremity: Secondary | ICD-10-CM | POA: Diagnosis not present

## 2024-02-28 DIAGNOSIS — Z7982 Long term (current) use of aspirin: Secondary | ICD-10-CM | POA: Diagnosis not present

## 2024-02-28 DIAGNOSIS — D62 Acute posthemorrhagic anemia: Secondary | ICD-10-CM | POA: Diagnosis not present

## 2024-02-28 DIAGNOSIS — Z7951 Long term (current) use of inhaled steroids: Secondary | ICD-10-CM | POA: Diagnosis not present

## 2024-02-29 DIAGNOSIS — I82409 Acute embolism and thrombosis of unspecified deep veins of unspecified lower extremity: Secondary | ICD-10-CM | POA: Diagnosis not present

## 2024-02-29 DIAGNOSIS — J4540 Moderate persistent asthma, uncomplicated: Secondary | ICD-10-CM | POA: Diagnosis not present

## 2024-02-29 DIAGNOSIS — J449 Chronic obstructive pulmonary disease, unspecified: Secondary | ICD-10-CM | POA: Diagnosis not present

## 2024-02-29 DIAGNOSIS — M80052D Age-related osteoporosis with current pathological fracture, left femur, subsequent encounter for fracture with routine healing: Secondary | ICD-10-CM | POA: Diagnosis not present

## 2024-02-29 DIAGNOSIS — F32A Depression, unspecified: Secondary | ICD-10-CM | POA: Diagnosis not present

## 2024-02-29 DIAGNOSIS — K219 Gastro-esophageal reflux disease without esophagitis: Secondary | ICD-10-CM | POA: Diagnosis not present

## 2024-03-02 DIAGNOSIS — Z4789 Encounter for other orthopedic aftercare: Secondary | ICD-10-CM | POA: Diagnosis not present

## 2024-03-03 DIAGNOSIS — J4540 Moderate persistent asthma, uncomplicated: Secondary | ICD-10-CM | POA: Diagnosis not present

## 2024-03-03 DIAGNOSIS — M80052D Age-related osteoporosis with current pathological fracture, left femur, subsequent encounter for fracture with routine healing: Secondary | ICD-10-CM | POA: Diagnosis not present

## 2024-03-03 DIAGNOSIS — F32A Depression, unspecified: Secondary | ICD-10-CM | POA: Diagnosis not present

## 2024-03-03 DIAGNOSIS — K219 Gastro-esophageal reflux disease without esophagitis: Secondary | ICD-10-CM | POA: Diagnosis not present

## 2024-03-03 DIAGNOSIS — J449 Chronic obstructive pulmonary disease, unspecified: Secondary | ICD-10-CM | POA: Diagnosis not present

## 2024-03-03 DIAGNOSIS — I82409 Acute embolism and thrombosis of unspecified deep veins of unspecified lower extremity: Secondary | ICD-10-CM | POA: Diagnosis not present

## 2024-03-04 DIAGNOSIS — K219 Gastro-esophageal reflux disease without esophagitis: Secondary | ICD-10-CM | POA: Diagnosis not present

## 2024-03-04 DIAGNOSIS — M80052D Age-related osteoporosis with current pathological fracture, left femur, subsequent encounter for fracture with routine healing: Secondary | ICD-10-CM | POA: Diagnosis not present

## 2024-03-04 DIAGNOSIS — J449 Chronic obstructive pulmonary disease, unspecified: Secondary | ICD-10-CM | POA: Diagnosis not present

## 2024-03-04 DIAGNOSIS — F32A Depression, unspecified: Secondary | ICD-10-CM | POA: Diagnosis not present

## 2024-03-04 DIAGNOSIS — J4540 Moderate persistent asthma, uncomplicated: Secondary | ICD-10-CM | POA: Diagnosis not present

## 2024-03-04 DIAGNOSIS — I82409 Acute embolism and thrombosis of unspecified deep veins of unspecified lower extremity: Secondary | ICD-10-CM | POA: Diagnosis not present

## 2024-03-07 DIAGNOSIS — J4540 Moderate persistent asthma, uncomplicated: Secondary | ICD-10-CM | POA: Diagnosis not present

## 2024-03-07 DIAGNOSIS — M80052D Age-related osteoporosis with current pathological fracture, left femur, subsequent encounter for fracture with routine healing: Secondary | ICD-10-CM | POA: Diagnosis not present

## 2024-03-07 DIAGNOSIS — K219 Gastro-esophageal reflux disease without esophagitis: Secondary | ICD-10-CM | POA: Diagnosis not present

## 2024-03-07 DIAGNOSIS — F32A Depression, unspecified: Secondary | ICD-10-CM | POA: Diagnosis not present

## 2024-03-07 DIAGNOSIS — J449 Chronic obstructive pulmonary disease, unspecified: Secondary | ICD-10-CM | POA: Diagnosis not present

## 2024-03-07 DIAGNOSIS — I82409 Acute embolism and thrombosis of unspecified deep veins of unspecified lower extremity: Secondary | ICD-10-CM | POA: Diagnosis not present

## 2024-03-09 DIAGNOSIS — J4540 Moderate persistent asthma, uncomplicated: Secondary | ICD-10-CM | POA: Diagnosis not present

## 2024-03-09 DIAGNOSIS — F32A Depression, unspecified: Secondary | ICD-10-CM | POA: Diagnosis not present

## 2024-03-09 DIAGNOSIS — K219 Gastro-esophageal reflux disease without esophagitis: Secondary | ICD-10-CM | POA: Diagnosis not present

## 2024-03-09 DIAGNOSIS — I82409 Acute embolism and thrombosis of unspecified deep veins of unspecified lower extremity: Secondary | ICD-10-CM | POA: Diagnosis not present

## 2024-03-09 DIAGNOSIS — J449 Chronic obstructive pulmonary disease, unspecified: Secondary | ICD-10-CM | POA: Diagnosis not present

## 2024-03-09 DIAGNOSIS — M80052D Age-related osteoporosis with current pathological fracture, left femur, subsequent encounter for fracture with routine healing: Secondary | ICD-10-CM | POA: Diagnosis not present

## 2024-03-14 ENCOUNTER — Ambulatory Visit: Payer: Medicare Other | Admitting: Pulmonary Disease

## 2024-03-17 ENCOUNTER — Inpatient Hospital Stay: Admitting: Family Medicine

## 2024-03-28 ENCOUNTER — Telehealth: Payer: Self-pay | Admitting: *Deleted

## 2024-03-28 DIAGNOSIS — S7292XD Unspecified fracture of left femur, subsequent encounter for closed fracture with routine healing: Secondary | ICD-10-CM

## 2024-03-28 NOTE — Telephone Encounter (Signed)
 Copied from CRM [id number]. Topic: Appointments - Appointment Scheduling >> Mar 28, 2024  2:08 PM Emylou G wrote: Broke femur.Aaron Aas and released from home health and home PT/OT.Aaron Aas But looking for referral on outside..Number on file is good >> Mar 28, 2024  2:09 PM Emylou G wrote: Broke femur.Aaron Aas and released from home health and home PT/OT.Aaron Aas But looking for referral on outside..Number on file is good

## 2024-03-28 NOTE — Telephone Encounter (Signed)
 Contacted pt to inquire about below, she stated that home health PT/OT discharged her and that she thinks it was maybe to soon,  she said she was doing really well and is looking to get a referral to outpatient rehab/PT to try and get full use of her leg. She stated she currently does not have full range of motion with it. Please let her know if you can place the referral or if she needs to come in for an appointment to be evaluated.

## 2024-03-29 NOTE — Telephone Encounter (Signed)
I can send it in.

## 2024-03-30 NOTE — Telephone Encounter (Signed)
 Pt informed of below.

## 2024-03-31 ENCOUNTER — Ambulatory Visit: Payer: Medicare Other

## 2024-03-31 DIAGNOSIS — Z0000 Encounter for general adult medical examination without abnormal findings: Secondary | ICD-10-CM | POA: Diagnosis not present

## 2024-03-31 NOTE — Progress Notes (Signed)
 Subjective:   Veronica Barrett is a 73 y.o. who presents for a Medicare Wellness preventive visit.  As a reminder, Annual Wellness Visits don't include a physical exam, and some assessments may be limited, especially if this visit is performed virtually. We may recommend an in-person visit if needed.  Visit Complete: Virtual I connected with  Veronica Barrett on 03/31/24 by a audio enabled telemedicine application and verified that I am speaking with the correct person using two identifiers.  Patient Location: Home  Provider Location: Home Office  I discussed the limitations of evaluation and management by telemedicine. The patient expressed understanding and agreed to proceed.  Vital Signs: Because this visit was a virtual/telehealth visit, some criteria may be missing or patient reported. Any vitals not documented were not able to be obtained and vitals that have been documented are patient reported.  VideoError- Librarian, academic were attempted between this provider and patient, however failed, due to patient having technical difficulties OR patient did not have access to video capability.  We continued and completed visit with audio only.   Persons Participating in Visit: Patient.  AWV Questionnaire: No: Patient Medicare AWV questionnaire was not completed prior to this visit.  Cardiac Risk Factors include: advanced age (>14men, >83 women);dyslipidemia     Objective:     Today's Vitals   There is no height or weight on file to calculate BMI.     03/31/2024    4:26 PM 03/26/2023    3:28 PM 06/17/2021   11:26 AM 10/17/2014    6:00 PM 10/17/2014   12:16 PM 10/10/2014   11:05 AM  Advanced Directives  Does Patient Have a Medical Advance Directive? No No No No No No  Would patient like information on creating a medical advance directive? No - Patient declined No - Patient declined No - Patient declined No - patient declined information No - patient declined  information No - patient declined information    Current Medications (verified) Outpatient Encounter Medications as of 03/31/2024  Medication Sig   acyclovir  (ZOVIRAX ) 200 MG capsule TAKE 1 CAPSULE BY MOUTH 5 TIMES DAILY AS NEEDED FOR  BREAKOUTS   albuterol  (VENTOLIN  HFA) 108 (90 Base) MCG/ACT inhaler Inhale 2 puffs into the lungs every 6 (six) hours as needed for wheezing or shortness of breath.   ASPIRIN  LOW DOSE 81 MG tablet SMARTSIG:1 Tablet(s) By Mouth Morning-Night   atorvastatin  (LIPITOR) 20 MG tablet Take 1 tablet by mouth once daily   cetirizine (ZYRTEC) 10 MG tablet Take 10 mg by mouth daily.   citalopram  (CELEXA ) 20 MG tablet TAKE 1 & 1/2 (ONE & ONE-HALF) TABLETS BY MOUTH ONCE DAILY   mometasone -formoterol  (DULERA ) 200-5 MCG/ACT AERO Inhale 2 puffs into the lungs 2 (two) times daily.   montelukast  (SINGULAIR ) 10 MG tablet Take 1 tablet (10 mg total) by mouth at bedtime.   Nebulizer System All-In-One MISC Use with nebulized albuterol  once every 4-6 hours for wheezing and shortness of breath.   Respiratory Therapy Supplies (NEBULIZER MASK ADULT) MISC 1 Inhaler by Does not apply route every 4 (four) hours as needed.   ipratropium-albuterol  (DUONEB) 0.5-2.5 (3) MG/3ML SOLN Take 3 mLs by nebulization every 6 (six) hours as needed (shortness of breath, cough or wheeze). (Patient not taking: Reported on 03/31/2024)   pantoprazole  (PROTONIX ) 40 MG tablet Take 1 tablet (40 mg total) by mouth daily. (Patient not taking: Reported on 03/31/2024)   No facility-administered encounter medications on file as of 03/31/2024.  Allergies (verified) Seasonal ic [cholestatin] and Tape   History: Past Medical History:  Diagnosis Date   Allergy    seasonal allergy   Arthritis    left hip   Asthma    Depression    HSV infection    vag   Past Surgical History:  Procedure Laterality Date   APPENDECTOMY  11/17/1982   EYE SURGERY Left    left vitreous   LEG SURGERY Left 01/10/2024   TOTAL HIP  ARTHROPLASTY Left 10/17/2014   Procedure: LEFT TOTAL HIP ARTHROPLASTY ANTERIOR APPROACH;  Surgeon: Bevin Bucks, MD;  Location: [facility];  Service: Orthopedics;  Laterality: Left;   Family History  Problem Relation Age of Onset   Cancer Father    Cancer Sister    Thyroid  cancer Sister    Arthritis Other    Colon cancer Neg Hx    Social History   Socioeconomic History   Marital status: Married    Spouse name: Not on file   Number of children: Not on file   Years of education: Not on file   Highest education level: Not on file  Occupational History   Not on file  Tobacco Use   Smoking status: Never    Passive exposure: Never   Smokeless tobacco: Never  Vaping Use   Vaping status: Never Used  Substance and Sexual Activity   Alcohol use: Yes    Alcohol/week: 10.0 standard drinks of alcohol    Types: 10 Glasses of wine per week   Drug use: No   Sexual activity: Not Currently  Other Topics Concern   Not on file  Social History Narrative   Not on file   Social Drivers of Health   Financial Resource Strain: Low Risk  (03/31/2024)   Overall Financial Resource Strain (CARDIA)    Difficulty of Paying Living Expenses: Not hard at all  Food Insecurity: No Food Insecurity (03/31/2024)   Hunger Vital Sign    Worried About Running Out of Food in the Last Year: Never true    Ran Out of Food in the Last Year: Never true  Transportation Needs: No Transportation Needs (03/31/2024)   PRAPARE - Administrator, Civil Service (Medical): No    Lack of Transportation (Non-Medical): No  Physical Activity: Inactive (03/31/2024)   Exercise Vital Sign    Days of Exercise per Week: 0 days    Minutes of Exercise per Session: 0 min  Stress: No Stress Concern Present (03/31/2024)   [employer] of Occupational Health - Occupational Stress Questionnaire    Feeling of Stress : Not at all  Social Connections: Moderately Isolated (03/31/2024)   Social Connection and Isolation Panel  [NHANES]    Frequency of Communication with Friends and Family: More than three times a week    Frequency of Social Gatherings with Friends and Family: Twice a week    Attends Religious Services: Never    Database administrator or Organizations: Yes    Attends Engineer, structural: More than 4 times per year    Marital Status: Widowed    Tobacco Counseling Counseling given: Not Answered    Clinical Intake:  Pre-visit preparation completed: Yes  Pain : No/denies pain     Nutritional Risks: None Diabetes: No  Lab Results  Component Value Date   HGBA1C 5.3 02/14/2020     How often do you need to have someone help you when you read instructions, pamphlets, or other written materials from your  doctor or pharmacy?: 1 - Never  Interpreter Needed?: No  Information entered by :: NAllen LPN   Activities of Daily Living     03/31/2024    4:18 PM  In your present state of health, do you have any difficulty performing the following activities:  Hearing? 1  Comment has hearing aids  Vision? 1  Comment has cataracts  Difficulty concentrating or making decisions? 0  Walking or climbing stairs? 1  Comment broken leg  Dressing or bathing? 0  Doing errands, shopping? 0  Preparing Food and eating ? N  Using the Toilet? N  In the past six months, have you accidently leaked urine? N  Do you have problems with loss of bowel control? N  Managing your Medications? N  Managing your Finances? N  Housekeeping or managing your Housekeeping? Y    Patient Care Team: Noreene Bearded, PA as PCP - General (Family Medicine) Claiborne Crew, MD as Consulting Physician (Orthopedic Surgery) Guinevere Lefevre, OD as Referring Physician (Optometry) Ob/Gyn, [facility] any recent Medical Services you may have received from other than [facility] providers in the past year (date may be approximate).     Assessment:    This is a routine wellness examination for  Veronica Barrett.  Hearing/Vision screen Hearing Screening - Comments:: Has hearing aids that are maintained Vision Screening - Comments:: Regular eye exams, [facility], Dr. Wyvonna Heidelberg   Goals Addressed             This Visit's Progress    Patient Stated       03/31/2024, want to get healed up       Depression Screen     03/31/2024    4:29 PM 08/10/2023   10:30 AM 03/26/2023    3:24 PM 12/10/2022    3:36 PM 06/12/2021   10:15 AM 06/05/2021    2:11 PM 04/12/2021    8:30 AM  PHQ 2/9 Scores  PHQ - 2 Score 1 2 0 0 0 0 0  PHQ- 9 Score 1 5  0 0 0 1    Fall Risk     03/31/2024    4:27 PM 12/07/2023    2:11 PM 03/26/2023    3:26 PM 12/10/2022    3:36 PM 06/12/2021   10:14 AM  Fall Risk   Falls in the past year? 1 0 0 0 0  Number falls in past yr: 0  0 0 0  Injury with Fall? 1  0 0 0  Comment broke leg      Risk for fall due to : Impaired mobility;Impaired balance/gait;Medication side effect  No Fall Risks  No Fall Risks  Follow up Falls prevention discussed;Falls evaluation completed  Falls prevention discussed Falls evaluation completed Falls evaluation completed    MEDICARE RISK AT HOME:  Medicare Risk at Home Any stairs in or around the home?: Yes If so, are there any without handrails?: No Home free of loose throw rugs in walkways, pet beds, electrical cords, etc?: Yes Adequate lighting in your home to reduce risk of falls?: Yes Life alert?: No Use of a cane, walker or w/c?: Yes Grab bars in the bathroom?: Yes Shower chair or bench in shower?: Yes Elevated toilet seat or a handicapped toilet?: Yes  TIMED UP AND GO:  Was the test performed?  No  Cognitive Function: 6CIT completed        03/31/2024    4:30 PM 03/26/2023    3:28 PM 04/12/2021  8:31 AM 02/14/2020   12:46 PM  6CIT Screen  What Year? 0 points 0 points 0 points 0 points  What month? 0 points 0 points  0 points  What time? 0 points 0 points 0 points 0 points  Count back from 20 0 points 0 points 0 points 0  points  Months in reverse 2 points 0 points 0 points 0 points  Repeat phrase 0 points 0 points 0 points 0 points  Total Score 2 points 0 points  0 points    Immunizations Immunization History  Administered Date(s) Administered   Influenza Whole 08/22/2009, 09/19/2010   Influenza, High Dose Seasonal PF 08/31/2018, 09/19/2019, 09/10/2022   Influenza,inj,Quad PF,6+ Mos 10/02/2014   Influenza-Unspecified 08/17/2014   PFIZER(Purple Top)SARS-COV-2 Vaccination 12/25/2019, 01/19/2020   Pfizer(Comirnaty)Fall Seasonal Vaccine 12 years and older 11/21/2023   Pneumococcal Conjugate-13 08/31/2018   Td 10/27/2000   Tdap 04/21/2013, 02/12/2023    Screening Tests Health Maintenance  Topic Date Due   Zoster Vaccines- Shingrix  (1 of 2) Never done   Pneumonia Vaccine 69+ Years old (2 of 2 - PPSV23) 08/09/2024 (Originally 10/26/2018)   COVID-19 Vaccine (4 - Pfizer risk 2024-25 season) 05/20/2024   INFLUENZA VACCINE  06/17/2024   Medicare Annual Wellness (AWV)  03/31/2025   MAMMOGRAM  09/17/2025   Fecal DNA (Cologuard)  09/06/2026   DTaP/Tdap/Td (4 - Td or Tdap) 02/11/2033   DEXA SCAN  Completed   Hepatitis C Screening  Completed   HPV VACCINES  Aged Out   Meningococcal B Vaccine  Aged Out   Colonoscopy  Discontinued    Health Maintenance  Health Maintenance Due  Topic Date Due   Zoster Vaccines- Shingrix  (1 of 2) Never done   Health Maintenance Items Addressed: States will be getting shingrix  soon.  Additional Screening:  Vision Screening: Recommended annual ophthalmology exams for early detection of glaucoma and other disorders of the eye.  Dental Screening: Recommended annual dental exams for proper oral hygiene  Community Resource Referral / Chronic Care Management: CRR required this visit?  No   CCM required this visit?  No   Plan:    I have personally reviewed and noted the following in the patient's chart:   Medical and social history Use of alcohol, tobacco or  illicit drugs  Current medications and supplements including opioid prescriptions. Patient is not currently taking opioid prescriptions. Functional ability and status Nutritional status Physical activity Advanced directives List of other physicians Hospitalizations, surgeries, and ER visits in previous 12 months Vitals Screenings to include cognitive, depression, and falls Referrals and appointments  In addition, I have reviewed and discussed with patient certain preventive protocols, quality metrics, and best practice recommendations. A written personalized care plan for preventive services as well as general preventive health recommendations were provided to patient.   Areatha Beecham, LPN   05/16/5283   After Visit Summary: (MyChart) Due to this being a telephonic visit, the after visit summary with patients personalized plan was offered to patient via MyChart   Notes: Nothing significant to report at this time.

## 2024-03-31 NOTE — Patient Instructions (Signed)
 Veronica Barrett , Thank you for taking time out of your busy schedule to complete your Annual Wellness Visit with me. I enjoyed our conversation and look forward to speaking with you again next year. I, as well as your care team,  appreciate your ongoing commitment to your health goals. Please review the following plan we discussed and let me know if I can assist you in the future. Your Game plan/ To Do List    Referrals: If you haven't heard from the office you've been referred to, please reach out to them at the phone provided.  N/a Follow up Visits: Next Medicare AWV with our clinical staff: 04/13/2025 at 4:10   Have you seen your provider in the last 6 months (3 months if uncontrolled diabetes)? Yes Next Office Visit with your provider: new provider not in yet  Clinician Recommendations:  Aim for 30 minutes of exercise or brisk walking, 6-8 glasses of water, and 5 servings of fruits and vegetables each day.       This is a list of the screening recommended for you and due dates:  Health Maintenance  Topic Date Due   Zoster (Shingles) Vaccine (1 of 2) Never done   Pneumonia Vaccine (2 of 2 - PPSV23) 08/09/2024*   COVID-19 Vaccine (4 - Pfizer risk 2024-25 season) 05/20/2024   Flu Shot  06/17/2024   Medicare Annual Wellness Visit  03/31/2025   Mammogram  09/17/2025   Cologuard (Stool DNA test)  09/06/2026   DTaP/Tdap/Td vaccine (4 - Td or Tdap) 02/11/2033   DEXA scan (bone density measurement)  Completed   Hepatitis C Screening  Completed   HPV Vaccine  Aged Out   Meningitis B Vaccine  Aged Out   Colon Cancer Screening  Discontinued  *Topic was postponed. The date shown is not the original due date.    Advanced directives: (ACP Link)Information on Advanced Care Planning can be found at [place]  Secretary of [facility] Advance Health Care Directives Advance Health Care Directives. [email or web address]  Advance Care Planning is important because it:  [x]  Makes sure you receive the medical care that  is consistent with your values, goals, and preferences  [x]  It provides guidance to your family and loved ones and reduces their decisional burden about whether or not they are making the right decisions based on your wishes.  Follow the link provided in your after visit summary or read over the paperwork we have mailed to you to help you started getting your Advance Directives in place. If you need assistance in completing these, please reach out to us  so that we can help you!  See attachments for Preventive Care and Fall Prevention Tips.

## 2024-04-05 ENCOUNTER — Other Ambulatory Visit: Payer: Self-pay | Admitting: Family Medicine

## 2024-04-05 DIAGNOSIS — B009 Herpesviral infection, unspecified: Secondary | ICD-10-CM

## 2024-04-14 DIAGNOSIS — H2513 Age-related nuclear cataract, bilateral: Secondary | ICD-10-CM | POA: Diagnosis not present

## 2024-04-14 DIAGNOSIS — H18413 Arcus senilis, bilateral: Secondary | ICD-10-CM | POA: Diagnosis not present

## 2024-04-14 DIAGNOSIS — H35371 Puckering of macula, right eye: Secondary | ICD-10-CM | POA: Diagnosis not present

## 2024-04-14 DIAGNOSIS — H25043 Posterior subcapsular polar age-related cataract, bilateral: Secondary | ICD-10-CM | POA: Diagnosis not present

## 2024-04-14 DIAGNOSIS — H25013 Cortical age-related cataract, bilateral: Secondary | ICD-10-CM | POA: Diagnosis not present

## 2024-04-14 DIAGNOSIS — H2512 Age-related nuclear cataract, left eye: Secondary | ICD-10-CM | POA: Diagnosis not present

## 2024-04-20 ENCOUNTER — Ambulatory Visit: Attending: Family Medicine

## 2024-04-20 ENCOUNTER — Other Ambulatory Visit: Payer: Self-pay

## 2024-04-20 DIAGNOSIS — R2681 Unsteadiness on feet: Secondary | ICD-10-CM | POA: Diagnosis not present

## 2024-04-20 DIAGNOSIS — S7292XD Unspecified fracture of left femur, subsequent encounter for closed fracture with routine healing: Secondary | ICD-10-CM | POA: Diagnosis not present

## 2024-04-20 DIAGNOSIS — R2689 Other abnormalities of gait and mobility: Secondary | ICD-10-CM | POA: Diagnosis not present

## 2024-04-20 DIAGNOSIS — M6281 Muscle weakness (generalized): Secondary | ICD-10-CM | POA: Diagnosis not present

## 2024-04-20 NOTE — Therapy (Signed)
 OUTPATIENT PHYSICAL THERAPY LOWER EXTREMITY EVALUATION   Patient Name: Veronica Barrett MRN: [id number] DOB:[birth year], 73 y.o., female Today's Date: 04/20/2024  END OF SESSION:  PT End of Session - 04/20/24 1534     Visit Number 1    Number of Visits 12    Date for PT Re-Evaluation 06/20/24    Authorization Type MCR    PT Start Time 1445    PT Stop Time 1530    PT Time Calculation (min) 45 min    Activity Tolerance Patient tolerated treatment well;Patient limited by pain    Behavior During Therapy [facility] for tasks assessed/performed             Past Medical History:  Diagnosis Date   Allergy    seasonal allergy   Arthritis    left hip   Asthma    Depression    HSV infection    vag   Past Surgical History:  Procedure Laterality Date   APPENDECTOMY  11/17/1982   EYE SURGERY Left    left vitreous   LEG SURGERY Left 01/10/2024   TOTAL HIP ARTHROPLASTY Left 10/17/2014   Procedure: LEFT TOTAL HIP ARTHROPLASTY ANTERIOR APPROACH;  Surgeon: Bevin Bucks, MD;  Location: [facility];  Service: Orthopedics;  Laterality: Left;   Patient Active Problem List   Diagnosis Date Noted   Osteoporosis without current pathological fracture 10/12/2023   Moderate persistent asthma 08/10/2023   Bronchitis 06/18/2021   Leukocytosis 06/17/2021   Environmental and seasonal allergies 10/07/2018   Chronic sinusitis 10/07/2018   H/O sinus surgery 10/07/2018   Hyperlipidemia 10/07/2018   S/P left THA, AA 10/17/2014   Recurrent HSV (herpes simplex virus) 04/21/2013   Depression, recurrent (HCC) 02/20/2010   Allergic rhinitis 04/16/2007    PCP: Noreene Bearded, PA   REFERRING PROVIDER: Laneta Pintos, MD  REFERRING DIAG: [phone] (ICD-10-CM) - Closed fracture of left femur with routine healing, unspecified fracture morphology, unspecified portion of femur, subsequent encounter  THERAPY DIAG:  Unsteadiness on feet  Muscle weakness (generalized)  Other abnormalities of gait and  mobility  Rationale for Evaluation and Treatment: Rehabilitation  ONSET DATE: 01/10/24 DOS  SUBJECTIVE:   SUBJECTIVE STATEMENT: Patient referred to OPPT s/p L femur fx/ORIF.  She describes mid thigh discomfort and struggles with stair climbing and arising from floor.    PERTINENT HISTORY: HPI: Veronica Barrett 73 y.o. female with past medical history of moderate persistent asthma, hyperlipidemia and depression who presented to the emergency department after a mechanical fall. Patient's fall led to direct strike of her left knee to the ground. Patient noted severe pain and inability to ambulate at that time. Imaging revealed a left distal femoral shaft fracture   Patient was hospitalized for evaluation and management of left distal 3rd femur shaft fracture. Orthopedic surgery evaluated in consultation and she underwent left femur ORIF on 01/10/2024. She has been evaluated by PT/OT postoperatively with recommendations for acute rehab. Pt is medically stable and has been evaluated and found to be appropriate for [facility] and admitted.  PAIN:  Are you having pain? Yes: NPRS scale: 3/10 Pain location: L thigh Pain description: ache Aggravating factors: stairs, prolonged activity Relieving factors: rest   PRECAUTIONS: Fall  RED FLAGS: None   WEIGHT BEARING RESTRICTIONS: No  FALLS:  Has patient fallen in last 6 months? No  OCCUPATION: retired  PLOF: Independent with household mobility with device  PATIENT GOALS: To manage my symptoms and wean of the cane  NEXT MD VISIT: 05/05/23  OBJECTIVE:  Note: Objective measures were completed at Evaluation unless otherwise noted.  DIAGNOSTIC FINDINGS: none available  PATIENT SURVEYS:  LEFS   MUSCLE LENGTH: Hamstrings: WFL Thomas test: No restriction  POSTURE: No Significant postural limitations  PALPATION: negative  LOWER EXTREMITY ROM:  Active ROM Right eval Left eval  Hip flexion    Hip extension    Hip abduction    Hip adduction     Hip internal rotation    Hip external rotation    Knee flexion 125d 118d  Knee extension 0d 0d  Ankle dorsiflexion    Ankle plantarflexion    Ankle inversion    Ankle eversion     (Blank rows = not tested)  LOWER EXTREMITY MMT:  MMT Right eval Left eval  Hip flexion    Hip extension    Hip abduction  4-  Hip adduction    Hip internal rotation    Hip external rotation    Knee flexion  4-  Knee extension  4-  Ankle dorsiflexion    Ankle plantarflexion    Ankle inversion    Ankle eversion     (Blank rows = not tested)  LOWER EXTREMITY SPECIAL TESTS:  deferred  FUNCTIONAL TESTS:  30 seconds chair stand test 9 reps w/o Ues   GAIT: Distance walked: 62ftx2 Assistive device utilized: Single point cane Level of assistance: Complete Independence Comments: unremarkable                                                                                                                                TREATMENT DATE:  [facility] Adult PT Treatment:                                                DATE: 04/20/24 Eval and HEP Self Care: Additional minutes spent for educating on updated Therapeutic Home Exercise Program as well as comparing current status to condition at start of symptoms. This included exercises focusing on stretching, strengthening, with focus on eccentric aspects. Long term goals include an improvement in range of motion, strength, endurance as well as avoiding reinjury. Patient's frequency would include in 1-2 times a day, 3-5 times a week for a duration of 6-12 weeks. Proper technique shown and discussed handout in great detail. All questions were discussed and addressed.      PATIENT EDUCATION:  Education details: Discussed eval findings, rehab rationale and POC and patient is in agreement  Person educated: Patient Education method: Explanation and Handouts Education comprehension: verbalized understanding and needs further education  HOME EXERCISE PROGRAM: Access  Code: [email or web address] URL: [email or web address] Date: 04/20/2024 Prepared by: Gretta Leavens  Exercises - Supine Quad Set  - 2-3 x daily - 5 x weekly - 1 sets - 10 reps - 3s hold - Small Range Straight Leg Raise  -  2-3 x daily - 5 x weekly - 1 sets - 10 reps - Clamshell with Resistance  - 2-3 x daily - 5 x weekly - 1 sets - 10 reps  ASSESSMENT:  CLINICAL IMPRESSION: Patient is a 73 y.o. female who was seen today for physical therapy evaluation and treatment for L LE weakness and dysfunction following fracture and ORIF 01/10/24.  Strength deficits note with 30s chair stand test, no leg length identified, extensor lag observed with SLR, L abduction strength limited.  Patient is a good candidate for OPPT to resolve strength, balance and ROM deficits and wean off cane when appropriate  OBJECTIVE IMPAIRMENTS: Abnormal gait, decreased activity tolerance, decreased balance, decreased endurance, decreased mobility, difficulty walking, decreased ROM, decreased strength, and pain.   ACTIVITY LIMITATIONS: carrying, lifting, standing, squatting, and stairs  PERSONAL FACTORS: Age, Past/current experiences, and Time since onset of injury/illness/exacerbation are also affecting patient's functional outcome.   REHAB POTENTIAL: Good  CLINICAL DECISION MAKING: Stable/uncomplicated  EVALUATION COMPLEXITY: Low   GOALS: Goals reviewed with patient? No  SHORT TERM GOALS: Target date: 05/11/2024   Patient to demonstrate independence in HEP  Baseline: HVAJZYRF Goal status: INITIAL  2.  Assess 2 MWT Baseline: TBD Goal status: INITIAL   LONG TERM GOALS: Target date: 06/01/2024    Patient will increase 30s chair stand reps from 9 to 12 without arms to demonstrate and improved functional ability with less pain/difficulty as well as reduce fall risk.  Baseline: 9 Goal status: INITIAL  2.  Patient will acknowledge 2/10 pain at least once during episode of care   Baseline: 4/10 Goal status:  INITIAL  3.  Patient will score at least 60/80 on LEFS to signify clinically meaningful improvement in functional abilities.   Baseline: 36/80 Goal status: INITIAL  4.  Increase L hip and knee strength to 4/5 in deficit regions Baseline: 4-/5 knee and hip abduction strength Goal status: INITIAL  5.  125d L knee flexion Baseline: 118d L knee flexion Goal status: INITIAL  6.  Patient to negotiate 16 steps with most appropriate pattern Baseline: TBD Goal status: INITIAL   PLAN:  PT FREQUENCY: 2x/week  PT DURATION: 6 weeks  PLANNED INTERVENTIONS: 97110-Therapeutic exercises, 97530- Therapeutic activity, 97112- Neuromuscular re-education, 97535- Self Care, 82956- Manual therapy, [phone]- Gait training, [phone]- Aquatic Therapy, Patient/Family education, Balance training, and Stair training  PLAN FOR NEXT SESSION: HEP review and update, manual techniques as appropriate, aerobic tasks, ROM and flexibility activities, strengthening and PREs, TPDN, gait and balance training as needed     Eldon Greenland, PT 04/20/2024, 3:35 PM

## 2024-04-25 DIAGNOSIS — H2511 Age-related nuclear cataract, right eye: Secondary | ICD-10-CM | POA: Diagnosis not present

## 2024-04-25 DIAGNOSIS — H52209 Unspecified astigmatism, unspecified eye: Secondary | ICD-10-CM | POA: Diagnosis not present

## 2024-04-25 DIAGNOSIS — H2513 Age-related nuclear cataract, bilateral: Secondary | ICD-10-CM | POA: Diagnosis not present

## 2024-04-26 DIAGNOSIS — H2512 Age-related nuclear cataract, left eye: Secondary | ICD-10-CM | POA: Diagnosis not present

## 2024-04-26 DIAGNOSIS — H25012 Cortical age-related cataract, left eye: Secondary | ICD-10-CM | POA: Diagnosis not present

## 2024-04-26 DIAGNOSIS — H25042 Posterior subcapsular polar age-related cataract, left eye: Secondary | ICD-10-CM | POA: Diagnosis not present

## 2024-04-26 NOTE — Therapy (Unsigned)
 OUTPATIENT PHYSICAL THERAPY TREATMENT NOTE   Patient Name: Veronica Barrett MRN: [id number] DOB:August 14, 1951, 73 y.o., female Today's Date: 04/28/2024  END OF SESSION:  PT End of Session - 04/28/24 1702     Visit Number 2    Number of Visits 12    Date for PT Re-Evaluation 06/20/24    Authorization Type MCR    PT Start Time 1700    PT Stop Time 1740    PT Time Calculation (min) 40 min    Activity Tolerance Patient tolerated treatment well;Patient limited by pain    Behavior During Therapy [facility] for tasks assessed/performed           Past Medical History:  Diagnosis Date   Allergy    seasonal allergy   Arthritis    left hip   Asthma    Depression    HSV infection    vag   Past Surgical History:  Procedure Laterality Date   APPENDECTOMY  11/17/1982   EYE SURGERY Left    left vitreous   LEG SURGERY Left 01/10/2024   TOTAL HIP ARTHROPLASTY Left 10/17/2014   Procedure: LEFT TOTAL HIP ARTHROPLASTY ANTERIOR APPROACH;  Surgeon: Bevin Bucks, MD;  Location: [facility];  Service: Orthopedics;  Laterality: Left;   Patient Active Problem List   Diagnosis Date Noted   Osteoporosis without current pathological fracture 10/12/2023   Moderate persistent asthma 08/10/2023   Bronchitis 06/18/2021   Leukocytosis 06/17/2021   Environmental and seasonal allergies 10/07/2018   Chronic sinusitis 10/07/2018   H/O sinus surgery 10/07/2018   Hyperlipidemia 10/07/2018   S/P left THA, AA 10/17/2014   Recurrent HSV (herpes simplex virus) 04/21/2013   Depression, recurrent (HCC) 02/20/2010   Allergic rhinitis 04/16/2007    PCP: Noreene Bearded, PA   REFERRING PROVIDER: Laneta Pintos, MD  REFERRING DIAG: [phone] (ICD-10-CM) - Closed fracture of left femur with routine healing, unspecified fracture morphology, unspecified portion of femur, subsequent encounter  THERAPY DIAG:  Unsteadiness on feet  Muscle weakness (generalized)  Other abnormalities of gait and mobility  Rationale  for Evaluation and Treatment: Rehabilitation  ONSET DATE: 01/10/24 DOS  SUBJECTIVE:   SUBJECTIVE STATEMENT: No issues to report with hip/LE but did have cataract surgery and is under a 10# lifting restriction.  PERTINENT HISTORY: HPI: Veronica Barrett 73 y.o. female with past medical history of moderate persistent asthma, hyperlipidemia and depression who presented to the emergency department after a mechanical fall. Patient's fall led to direct strike of her left knee to the ground. Patient noted severe pain and inability to ambulate at that time. Imaging revealed a left distal femoral shaft fracture   Patient was hospitalized for evaluation and management of left distal 3rd femur shaft fracture. Orthopedic surgery evaluated in consultation and she underwent left femur ORIF on 01/10/2024. She has been evaluated by PT/OT postoperatively with recommendations for acute rehab. Pt is medically stable and has been evaluated and found to be appropriate for [facility] and admitted.  PAIN:  Are you having pain? Yes: NPRS scale: 3/10 Pain location: L thigh Pain description: ache Aggravating factors: stairs, prolonged activity Relieving factors: rest   PRECAUTIONS: Fall  RED FLAGS: None   WEIGHT BEARING RESTRICTIONS: No  FALLS:  Has patient fallen in last 6 months? No  OCCUPATION: retired  PLOF: Independent with household mobility with device  PATIENT GOALS: To manage my symptoms and wean of the cane  NEXT MD VISIT: 05/05/23  OBJECTIVE:  Note: Objective measures were completed at Evaluation  unless otherwise noted.  DIAGNOSTIC FINDINGS: none available  PATIENT SURVEYS:  LEFS   MUSCLE LENGTH: Hamstrings: WFL Thomas test: No restriction  POSTURE: No Significant postural limitations  PALPATION: negative  LOWER EXTREMITY ROM:  Active ROM Right eval Left eval  Hip flexion    Hip extension    Hip abduction    Hip adduction    Hip internal rotation    Hip external rotation    Knee  flexion 125d 118d  Knee extension 0d 0d  Ankle dorsiflexion    Ankle plantarflexion    Ankle inversion    Ankle eversion     (Blank rows = not tested)  LOWER EXTREMITY MMT:  MMT Right eval Left eval  Hip flexion    Hip extension    Hip abduction  4-  Hip adduction    Hip internal rotation    Hip external rotation    Knee flexion  4-  Knee extension  4-  Ankle dorsiflexion    Ankle plantarflexion    Ankle inversion    Ankle eversion     (Blank rows = not tested)  LOWER EXTREMITY SPECIAL TESTS:  deferred  FUNCTIONAL TESTS:  30 seconds chair stand test 9 reps w/o Ues   GAIT: Distance walked: 76ftx2 Assistive device utilized: Single point cane Level of assistance: Complete Independence Comments: unremarkable                                                                                                                                TREATMENT DATE:  [facility] Adult PT Treatment:                                                DATE: 04/28/24 Therapeutic Exercise: Nustep L2 8 min Seated L hamstring stretch 30s x2 Supine L hip flexor stretch 30s x2 Neuromuscular re-ed: Supine hip fallouts RTB 15x B, 15/15 unilaterally Bridge against RTB S/L clams RTB 15/15 Therapeutic Activity: STS from airex pad PPT 3s 15x PPT with LE march 10x  Bridge with ball 15x  [facility] Adult PT Treatment:                                                DATE: 04/20/24 Eval and HEP Self Care: Additional minutes spent for educating on updated Therapeutic Home Exercise Program as well as comparing current status to condition at start of symptoms. This included exercises focusing on stretching, strengthening, with focus on eccentric aspects. Long term goals include an improvement in range of motion, strength, endurance as well as avoiding reinjury. Patient's frequency would include in 1-2 times a day, 3-5 times a week for a duration of 6-12 weeks. Proper  technique shown and discussed handout in great detail.  All questions were discussed and addressed.      PATIENT EDUCATION:  Education details: Discussed eval findings, rehab rationale and POC and patient is in agreement  Person educated: Patient Education method: Explanation and Handouts Education comprehension: verbalized understanding and needs further education  HOME EXERCISE PROGRAM: Access Code: [email or web address] URL: [email or web address] Date: 04/20/2024 Prepared by: Gretta Leavens  Exercises - Supine Quad Set  - 2-3 x daily - 5 x weekly - 1 sets - 10 reps - 3s hold - Small Range Straight Leg Raise  - 2-3 x daily - 5 x weekly - 1 sets - 10 reps - Clamshell with Resistance  - 2-3 x daily - 5 x weekly - 1 sets - 10 reps  ASSESSMENT:  CLINICAL IMPRESSION: Focus of session was B hip and LE strengthening.  Included core work as well as aerobic w/u.  Patient able to complete all tasks w/o exacerbation of symptoms with good form and cadence.  Patient is a 73 y.o. female who was seen today for physical therapy evaluation and treatment for L LE weakness and dysfunction following fracture and ORIF 01/10/24.  Strength deficits note with 30s chair stand test, no leg length identified, extensor lag observed with SLR, L abduction strength limited.  Patient is a good candidate for OPPT to resolve strength, balance and ROM deficits and wean off cane when appropriate  OBJECTIVE IMPAIRMENTS: Abnormal gait, decreased activity tolerance, decreased balance, decreased endurance, decreased mobility, difficulty walking, decreased ROM, decreased strength, and pain.   ACTIVITY LIMITATIONS: carrying, lifting, standing, squatting, and stairs  PERSONAL FACTORS: Age, Past/current experiences, and Time since onset of injury/illness/exacerbation are also affecting patient's functional outcome.   REHAB POTENTIAL: Good  CLINICAL DECISION MAKING: Stable/uncomplicated  EVALUATION COMPLEXITY: Low   GOALS: Goals reviewed with patient? No  SHORT TERM  GOALS: Target date: 05/11/2024   Patient to demonstrate independence in HEP  Baseline: HVAJZYRF Goal status: INITIAL  2.  Assess 2 MWT Baseline: TBD Goal status: INITIAL   LONG TERM GOALS: Target date: 06/01/2024    Patient will increase 30s chair stand reps from 9 to 12 without arms to demonstrate and improved functional ability with less pain/difficulty as well as reduce fall risk.  Baseline: 9 Goal status: INITIAL  2.  Patient will acknowledge 2/10 pain at least once during episode of care   Baseline: 4/10 Goal status: INITIAL  3.  Patient will score at least 60/80 on LEFS to signify clinically meaningful improvement in functional abilities.   Baseline: 36/80 Goal status: INITIAL  4.  Increase L hip and knee strength to 4/5 in deficit regions Baseline: 4-/5 knee and hip abduction strength Goal status: INITIAL  5.  125d L knee flexion Baseline: 118d L knee flexion Goal status: INITIAL  6.  Patient to negotiate 16 steps with most appropriate pattern Baseline: TBD Goal status: INITIAL   PLAN:  PT FREQUENCY: 2x/week  PT DURATION: 6 weeks  PLANNED INTERVENTIONS: 97110-Therapeutic exercises, 97530- Therapeutic activity, 97112- Neuromuscular re-education, 97535- Self Care, 16109- Manual therapy, [phone]- Gait training, [phone]- Aquatic Therapy, Patient/Family education, Balance training, and Stair training  PLAN FOR NEXT SESSION: HEP review and update, manual techniques as appropriate, aerobic tasks, ROM and flexibility activities, strengthening and PREs, TPDN, gait and balance training as needed     Eldon Greenland, PT 04/28/2024, 5:41 PM

## 2024-04-27 ENCOUNTER — Other Ambulatory Visit: Payer: Self-pay | Admitting: Family Medicine

## 2024-04-27 DIAGNOSIS — F339 Major depressive disorder, recurrent, unspecified: Secondary | ICD-10-CM

## 2024-04-28 ENCOUNTER — Ambulatory Visit

## 2024-04-28 DIAGNOSIS — R2689 Other abnormalities of gait and mobility: Secondary | ICD-10-CM | POA: Diagnosis not present

## 2024-04-28 DIAGNOSIS — R2681 Unsteadiness on feet: Secondary | ICD-10-CM | POA: Diagnosis not present

## 2024-04-28 DIAGNOSIS — M6281 Muscle weakness (generalized): Secondary | ICD-10-CM | POA: Diagnosis not present

## 2024-04-28 DIAGNOSIS — S7292XD Unspecified fracture of left femur, subsequent encounter for closed fracture with routine healing: Secondary | ICD-10-CM | POA: Diagnosis not present

## 2024-05-03 ENCOUNTER — Ambulatory Visit: Admitting: Physical Therapy

## 2024-05-03 ENCOUNTER — Encounter: Payer: Self-pay | Admitting: Physical Therapy

## 2024-05-03 DIAGNOSIS — R2689 Other abnormalities of gait and mobility: Secondary | ICD-10-CM | POA: Diagnosis not present

## 2024-05-03 DIAGNOSIS — R2681 Unsteadiness on feet: Secondary | ICD-10-CM

## 2024-05-03 DIAGNOSIS — M6281 Muscle weakness (generalized): Secondary | ICD-10-CM

## 2024-05-03 DIAGNOSIS — S7292XD Unspecified fracture of left femur, subsequent encounter for closed fracture with routine healing: Secondary | ICD-10-CM | POA: Diagnosis not present

## 2024-05-03 NOTE — Therapy (Signed)
 OUTPATIENT PHYSICAL THERAPY TREATMENT NOTE   Patient Name: Veronica Barrett MRN: [id number] DOB:1951/08/10, 73 y.o., female Today's Date: 05/03/2024  END OF SESSION:  PT End of Session - 05/03/24 1204     Visit Number 3    Number of Visits 12    Date for PT Re-Evaluation 06/20/24    Authorization Type MCR    PT Start Time 1215    PT Stop Time 1255    PT Time Calculation (min) 40 min    Activity Tolerance Patient tolerated treatment well;Patient limited by pain    Behavior During Therapy [facility] for tasks assessed/performed           Past Medical History:  Diagnosis Date   Allergy    seasonal allergy   Arthritis    left hip   Asthma    Depression    HSV infection    vag   Past Surgical History:  Procedure Laterality Date   APPENDECTOMY  11/17/1982   EYE SURGERY Left    left vitreous   LEG SURGERY Left 01/10/2024   TOTAL HIP ARTHROPLASTY Left 10/17/2014   Procedure: LEFT TOTAL HIP ARTHROPLASTY ANTERIOR APPROACH;  Surgeon: Bevin Bucks, MD;  Location: [facility];  Service: Orthopedics;  Laterality: Left;   Patient Active Problem List   Diagnosis Date Noted   Osteoporosis without current pathological fracture 10/12/2023   Moderate persistent asthma 08/10/2023   Bronchitis 06/18/2021   Leukocytosis 06/17/2021   Environmental and seasonal allergies 10/07/2018   Chronic sinusitis 10/07/2018   H/O sinus surgery 10/07/2018   Hyperlipidemia 10/07/2018   S/P left THA, AA 10/17/2014   Recurrent HSV (herpes simplex virus) 04/21/2013   Depression, recurrent (HCC) 02/20/2010   Allergic rhinitis 04/16/2007    PCP: Noreene Bearded, PA   REFERRING PROVIDER: Laneta Pintos, MD  REFERRING DIAG: [phone] (ICD-10-CM) - Closed fracture of left femur with routine healing, unspecified fracture morphology, unspecified portion of femur, subsequent encounter  THERAPY DIAG:  Unsteadiness on feet  Muscle weakness (generalized)  Other abnormalities of gait and mobility  Rationale  for Evaluation and Treatment: Rehabilitation  ONSET DATE: 01/10/24 DOS  SUBJECTIVE:   SUBJECTIVE STATEMENT:  Pt attended today's session with reports of minimal pain in the hip, main discomfort is in the knee. Pt stated that they have maintained good compliance with current HEP.  Will be having L cataract surgery this upcoming Monday 05/09/2024.   PERTINENT HISTORY: HPI: Veronica Barrett 73 y.o. female with past medical history of moderate persistent asthma, hyperlipidemia and depression who presented to the emergency department after a mechanical fall. Patient's fall led to direct strike of her left knee to the ground. Patient noted severe pain and inability to ambulate at that time. Imaging revealed a left distal femoral shaft fracture   Patient was hospitalized for evaluation and management of left distal 3rd femur shaft fracture. Orthopedic surgery evaluated in consultation and she underwent left femur ORIF on 01/10/2024. She has been evaluated by PT/OT postoperatively with recommendations for acute rehab. Pt is medically stable and has been evaluated and found to be appropriate for [facility] and admitted.  PAIN:  Are you having pain? Yes: NPRS scale: 3/10 Pain location: L thigh Pain description: ache Aggravating factors: stairs, prolonged activity Relieving factors: rest   PRECAUTIONS: Fall  RED FLAGS: None   WEIGHT BEARING RESTRICTIONS: No  FALLS:  Has patient fallen in last 6 months? No  OCCUPATION: retired  PLOF: Independent with household mobility with device  PATIENT GOALS:  To manage my symptoms and wean of the cane  NEXT MD VISIT: 05/05/23  OBJECTIVE:  Note: Objective measures were completed at Evaluation unless otherwise noted.  DIAGNOSTIC FINDINGS: none available  PATIENT SURVEYS:  LEFS   MUSCLE LENGTH: Hamstrings: WFL Thomas test: No restriction  POSTURE: No Significant postural limitations  PALPATION: negative  LOWER EXTREMITY ROM:  Active ROM Right eval  Left eval  Hip flexion    Hip extension    Hip abduction    Hip adduction    Hip internal rotation    Hip external rotation    Knee flexion 125d 118d  Knee extension 0d 0d  Ankle dorsiflexion    Ankle plantarflexion    Ankle inversion    Ankle eversion     (Blank rows = not tested)  LOWER EXTREMITY MMT:  MMT Right eval Left eval  Hip flexion    Hip extension    Hip abduction  4-  Hip adduction    Hip internal rotation    Hip external rotation    Knee flexion  4-  Knee extension  4-  Ankle dorsiflexion    Ankle plantarflexion    Ankle inversion    Ankle eversion     (Blank rows = not tested)  LOWER EXTREMITY SPECIAL TESTS:  deferred  FUNCTIONAL TESTS:  30 seconds chair stand test 9 reps w/o Ues   GAIT: Distance walked: 50ftx2 Assistive device utilized: Single point cane Level of assistance: Complete Independence Comments: unremarkable                                                                                                                                TREATMENT:   [facility] Adult PT Treatment:                                                DATE: 05/03/2024  Therapeutic Exercise: NuStep  8'  Seated L hamstring stretch w/APT and hip ER 2x1' Therapeutic Activity: Supine hip fall out 2x12 B, hold 2s, GTB Supine bridge w/GTB 2x12, hold 4s Standing hip abduction 2x12, hold 2s Split stance LLE TKE 1x12, hold 3s   [facility] Adult PT Treatment:                                                DATE: 04/28/24 Therapeutic Exercise: Nustep L2 8 min Seated L hamstring stretch 30s x2 Supine L hip flexor stretch 30s x2 Neuromuscular re-ed: Supine hip fallouts RTB 15x B, 15/15 unilaterally Bridge against RTB S/L clams RTB 15/15 Therapeutic Activity: STS from airex pad PPT 3s 15x PPT with LE march 10x  Bridge with ball 15x   PATIENT EDUCATION:  Education  details: Discussed eval findings, rehab rationale and POC and patient is in agreement  Person  educated: Patient Education method: Explanation and Handouts Education comprehension: verbalized understanding and needs further education  HOME EXERCISE PROGRAM: Access Code: [email or web address] URL: [email or web address] Date: 04/20/2024 Prepared by: Gretta Leavens  Exercises - Supine Quad Set  - 2-3 x daily - 5 x weekly - 1 sets - 10 reps - 3s hold - Small Range Straight Leg Raise  - 2-3 x daily - 5 x weekly - 1 sets - 10 reps - Clamshell with Resistance  - 2-3 x daily - 5 x weekly - 1 sets - 10 reps  ASSESSMENT:  CLINICAL IMPRESSION:  Pt attended physical therapy session for continuation of treatment regarding LLE dysfunction following. Today's treatment focused on improvement of  LLE strength,stability, motility, and general activity tolerance. Pt showed great tolerance to administered treatment with no adverse effects by the end of session. Skilled intervention was utilized via activity modification for pt tolerance with task completion, functional progression/regression promoting best outcomes inline with current rehab goals, as well as minimal verbal/tactile cuing alongside no physical assistance for safe and appropriate performance of today's activities. Continue to progress as tolerated.   Patient is a 73 y.o. female who was seen today for physical therapy evaluation and treatment for L LE weakness and dysfunction following fracture and ORIF 01/10/24.  Strength deficits note with 30s chair stand test, no leg length identified, extensor lag observed with SLR, L abduction strength limited.  Patient is a good candidate for OPPT to resolve strength, balance and ROM deficits and wean off cane when appropriate  OBJECTIVE IMPAIRMENTS: Abnormal gait, decreased activity tolerance, decreased balance, decreased endurance, decreased mobility, difficulty walking, decreased ROM, decreased strength, and pain.   ACTIVITY LIMITATIONS: carrying, lifting, standing, squatting, and stairs  PERSONAL  FACTORS: Age, Past/current experiences, and Time since onset of injury/illness/exacerbation are also affecting patient's functional outcome.   REHAB POTENTIAL: Good  CLINICAL DECISION MAKING: Stable/uncomplicated  EVALUATION COMPLEXITY: Low   GOALS: Goals reviewed with patient? No  SHORT TERM GOALS: Target date: 05/11/2024   Patient to demonstrate independence in HEP  Baseline: [email or web address] Goal status: INITIAL  2.  Assess 2 MWT Baseline: TBD Goal status: INITIAL   LONG TERM GOALS: Target date: 06/01/2024    Patient will increase 30s chair stand reps from 9 to 12 without arms to demonstrate and improved functional ability with less pain/difficulty as well as reduce fall risk.  Baseline: 9 Goal status: INITIAL  2.  Patient will acknowledge 2/10 pain at least once during episode of care   Baseline: 4/10 Goal status: INITIAL  3.  Patient will score at least 60/80 on LEFS to signify clinically meaningful improvement in functional abilities.   Baseline: 36/80 Goal status: INITIAL  4.  Increase L hip and knee strength to 4/5 in deficit regions Baseline: 4-/5 knee and hip abduction strength Goal status: INITIAL  5.  125d L knee flexion Baseline: 118d L knee flexion Goal status: INITIAL  6.  Patient to negotiate 16 steps with most appropriate pattern Baseline: TBD Goal status: INITIAL   PLAN:  PT FREQUENCY: 2x/week  PT DURATION: 6 weeks  PLANNED INTERVENTIONS: 97110-Therapeutic exercises, 97530- Therapeutic activity, [id number]- Neuromuscular re-education, 97535- Self Care, [id number]- Manual therapy, [phone]- Gait training, [phone]- Aquatic Therapy, Patient/Family education, Balance training, and Stair training  PLAN FOR NEXT SESSION: HEP review and update, manual techniques as appropriate, aerobic tasks, ROM and flexibility activities, strengthening and PREs, TPDN, gait and balance training  as needed     Albesa Huguenin, PT, DPT 05/03/2024, 12:56 PM

## 2024-05-04 DIAGNOSIS — Z4789 Encounter for other orthopedic aftercare: Secondary | ICD-10-CM | POA: Diagnosis not present

## 2024-05-04 DIAGNOSIS — S7292XA Unspecified fracture of left femur, initial encounter for closed fracture: Secondary | ICD-10-CM | POA: Diagnosis not present

## 2024-05-09 DIAGNOSIS — H2512 Age-related nuclear cataract, left eye: Secondary | ICD-10-CM | POA: Diagnosis not present

## 2024-05-09 DIAGNOSIS — H52209 Unspecified astigmatism, unspecified eye: Secondary | ICD-10-CM | POA: Diagnosis not present

## 2024-05-09 DIAGNOSIS — H25012 Cortical age-related cataract, left eye: Secondary | ICD-10-CM | POA: Diagnosis not present

## 2024-05-09 DIAGNOSIS — H25042 Posterior subcapsular polar age-related cataract, left eye: Secondary | ICD-10-CM | POA: Diagnosis not present

## 2024-05-11 NOTE — Therapy (Signed)
 OUTPATIENT PHYSICAL THERAPY TREATMENT NOTE   Patient Name: Veronica Barrett MRN: [id number] DOB:11/03/1951, 73 y.o., female Today's Date: 05/13/2024  END OF SESSION:  PT End of Session - 05/13/24 1052     Visit Number 4    Number of Visits 12    Date for PT Re-Evaluation 06/20/24    Authorization Type MCR    PT Start Time 1050    PT Stop Time 1130    PT Time Calculation (min) 40 min    Activity Tolerance Patient tolerated treatment well;Patient limited by pain    Behavior During Therapy [facility] for tasks assessed/performed            Past Medical History:  Diagnosis Date   Allergy    seasonal allergy   Arthritis    left hip   Asthma    Depression    HSV infection    vag   Past Surgical History:  Procedure Laterality Date   APPENDECTOMY  11/17/1982   EYE SURGERY Left    left vitreous   LEG SURGERY Left 01/10/2024   TOTAL HIP ARTHROPLASTY Left 10/17/2014   Procedure: LEFT TOTAL HIP ARTHROPLASTY ANTERIOR APPROACH;  Surgeon: Donnice JONETTA Car, MD;  Location: [facility];  Service: Orthopedics;  Laterality: Left;   Patient Active Problem List   Diagnosis Date Noted   Osteoporosis without current pathological fracture 10/12/2023   Moderate persistent asthma 08/10/2023   Bronchitis 06/18/2021   Leukocytosis 06/17/2021   Environmental and seasonal allergies 10/07/2018   Chronic sinusitis 10/07/2018   H/O sinus surgery 10/07/2018   Hyperlipidemia 10/07/2018   S/P left THA, AA 10/17/2014   Recurrent HSV (herpes simplex virus) 04/21/2013   Depression, recurrent (HCC) 02/20/2010   Allergic rhinitis 04/16/2007    PCP: Wallace Joesph LABOR, PA   REFERRING PROVIDER: Chandra Toribio POUR, MD  REFERRING DIAG: [phone] (ICD-10-CM) - Closed fracture of left femur with routine healing, unspecified fracture morphology, unspecified portion of femur, subsequent encounter  THERAPY DIAG:  Unsteadiness on feet  Muscle weakness (generalized)  Other abnormalities of gait and mobility  Rationale  for Evaluation and Treatment: Rehabilitation  ONSET DATE: 01/10/24 DOS  SUBJECTIVE:   SUBJECTIVE STATEMENT: Reports no resting pain. Symptoms increase with prolonged standing/walking, distal to L hip region.  Reviewed x-rays and symptoms at the site of hardware placement    PERTINENT HISTORY: HPI: Shalunda Lindh 73 y.o. female with past medical history of moderate persistent asthma, hyperlipidemia and depression who presented to the emergency department after a mechanical fall. Patient's fall led to direct strike of her left knee to the ground. Patient noted severe pain and inability to ambulate at that time. Imaging revealed a left distal femoral shaft fracture   Patient was hospitalized for evaluation and management of left distal 3rd femur shaft fracture. Orthopedic surgery evaluated in consultation and she underwent left femur ORIF on 01/10/2024. She has been evaluated by PT/OT postoperatively with recommendations for acute rehab. Pt is medically stable and has been evaluated and found to be appropriate for [facility] and admitted.  PAIN:  Are you having pain? Yes: NPRS scale: 3/10 Pain location: L thigh Pain description: ache Aggravating factors: stairs, prolonged activity Relieving factors: rest   PRECAUTIONS: Fall  RED FLAGS: None   WEIGHT BEARING RESTRICTIONS: No  FALLS:  Has patient fallen in last 6 months? No  OCCUPATION: retired  PLOF: Independent with household mobility with device  PATIENT GOALS: To manage my symptoms and wean of the cane  NEXT MD VISIT: 05/05/23  OBJECTIVE:  Note: Objective measures were completed at Evaluation unless otherwise noted.  DIAGNOSTIC FINDINGS: none available  PATIENT SURVEYS:  LEFS   MUSCLE LENGTH: Hamstrings: WFL Thomas test: No restriction  POSTURE: No Significant postural limitations  PALPATION: negative  LOWER EXTREMITY ROM:  Active ROM Right eval Left eval  Hip flexion    Hip extension    Hip abduction    Hip adduction     Hip internal rotation    Hip external rotation    Knee flexion 125d 118d  Knee extension 0d 0d  Ankle dorsiflexion    Ankle plantarflexion    Ankle inversion    Ankle eversion     (Blank rows = not tested)  LOWER EXTREMITY MMT:  MMT Right eval Left eval  Hip flexion    Hip extension    Hip abduction  4-  Hip adduction    Hip internal rotation    Hip external rotation    Knee flexion  4-  Knee extension  4-  Ankle dorsiflexion    Ankle plantarflexion    Ankle inversion    Ankle eversion     (Blank rows = not tested)  LOWER EXTREMITY SPECIAL TESTS:  deferred  FUNCTIONAL TESTS:  30 seconds chair stand test 9 reps w/o Ues   GAIT: Distance walked: 40ftx2 Assistive device utilized: Single point cane Level of assistance: Complete Independence Comments: unremarkable                                                                                                                                TREATMENT:  [facility] Adult PT Treatment:                                                DATE: 05/13/24 Therapeutic Exercise: Nustep L3 8 min Neuromuscular re-ed: Supine hip fallouts GTB 15x B, 15/15 unilaterally Bridge against GTB 15x L clams in R S/L 15x GTB P-ball curl ups 10x B, 10/10 unilaterally Therapeutic Activity: Seated L hamstring stretch 30s x2 Supine L hip flexor stretch 30s x2 L QL stretch 30s x2  [facility] Adult PT Treatment:                                                DATE: 05/03/2024  Therapeutic Exercise: NuStep  8'  Seated L hamstring stretch w/APT and hip ER 2x1' Therapeutic Activity: Supine hip fall out 2x12 B, hold 2s, GTB Supine bridge w/GTB 2x12, hold 4s Standing hip abduction 2x12, hold 2s Split stance LLE TKE 1x12, hold 3s   [facility] Adult PT Treatment:  DATE: 04/28/24 Therapeutic Exercise: Nustep L2 8 min Seated L hamstring stretch 30s x2 Supine L hip flexor stretch 30s x2 Neuromuscular  re-ed: Supine hip fallouts RTB 15x B, 15/15 unilaterally Bridge against RTB S/L clams RTB 15/15 Therapeutic Activity: STS from airex pad PPT 3s 15x PPT with LE march 10x  Bridge with ball 15x   PATIENT EDUCATION:  Education details: Discussed eval findings, rehab rationale and POC and patient is in agreement  Person educated: Patient Education method: Explanation and Handouts Education comprehension: verbalized understanding and needs further education  HOME EXERCISE PROGRAM: Access Code: [email or web address] URL: [email or web address] Date: 04/20/2024 Prepared by: Reyes Kohut  Exercises - Supine Quad Set  - 2-3 x daily - 5 x weekly - 1 sets - 10 reps - 3s hold - Small Range Straight Leg Raise  - 2-3 x daily - 5 x weekly - 1 sets - 10 reps - Clamshell with Resistance  - 2-3 x daily - 5 x weekly - 1 sets - 10 reps  ASSESSMENT:  CLINICAL IMPRESSION: Still presenting with lateral thigh discomfort, mainly at site of hardware placement.  Focus of today was aerobic w/u, L hip stretching adding QL stretch, lumbopelvic strength and stabilization tasks.  Incorporated core tasks and increased resistance of T-band to challenge patient.   Patient is a 73 y.o. female who was seen today for physical therapy evaluation and treatment for L LE weakness and dysfunction following fracture and ORIF 01/10/24.  Strength deficits note with 30s chair stand test, no leg length identified, extensor lag observed with SLR, L abduction strength limited.  Patient is a good candidate for OPPT to resolve strength, balance and ROM deficits and wean off cane when appropriate  OBJECTIVE IMPAIRMENTS: Abnormal gait, decreased activity tolerance, decreased balance, decreased endurance, decreased mobility, difficulty walking, decreased ROM, decreased strength, and pain.   ACTIVITY LIMITATIONS: carrying, lifting, standing, squatting, and stairs  PERSONAL FACTORS: Age, Past/current experiences, and Time since  onset of injury/illness/exacerbation are also affecting patient's functional outcome.   REHAB POTENTIAL: Good  CLINICAL DECISION MAKING: Stable/uncomplicated  EVALUATION COMPLEXITY: Low   GOALS: Goals reviewed with patient? No  SHORT TERM GOALS: Target date: 05/11/2024   Patient to demonstrate independence in HEP  Baseline: HVAJZYRF Goal status: INITIAL  2.  Assess 2 MWT Baseline: TBD Goal status: INITIAL   LONG TERM GOALS: Target date: 06/01/2024    Patient will increase 30s chair stand reps from 9 to 12 without arms to demonstrate and improved functional ability with less pain/difficulty as well as reduce fall risk.  Baseline: 9 Goal status: INITIAL  2.  Patient will acknowledge 2/10 pain at least once during episode of care   Baseline: 4/10 Goal status: INITIAL  3.  Patient will score at least 60/80 on LEFS to signify clinically meaningful improvement in functional abilities.   Baseline: 36/80 Goal status: INITIAL  4.  Increase L hip and knee strength to 4/5 in deficit regions Baseline: 4-/5 knee and hip abduction strength Goal status: INITIAL  5.  125d L knee flexion Baseline: 118d L knee flexion Goal status: INITIAL  6.  Patient to negotiate 16 steps with most appropriate pattern Baseline: TBD Goal status: INITIAL   PLAN:  PT FREQUENCY: 2x/week  PT DURATION: 6 weeks  PLANNED INTERVENTIONS: 97110-Therapeutic exercises, 97530- Therapeutic activity, [id number]- Neuromuscular re-education, 97535- Self Care, [id number]- Manual therapy, [phone]- Gait training, [phone]- Aquatic Therapy, Patient/Family education, Balance training, and Stair training  PLAN FOR NEXT SESSION: HEP review and update, manual techniques as  appropriate, aerobic tasks, ROM and flexibility activities, strengthening and PREs, TPDN, gait and balance training as needed     Mabel Kiang, PT, DPT 05/13/2024, 11:35 AM

## 2024-05-13 ENCOUNTER — Ambulatory Visit

## 2024-05-13 DIAGNOSIS — S7292XD Unspecified fracture of left femur, subsequent encounter for closed fracture with routine healing: Secondary | ICD-10-CM | POA: Diagnosis not present

## 2024-05-13 DIAGNOSIS — R2689 Other abnormalities of gait and mobility: Secondary | ICD-10-CM

## 2024-05-13 DIAGNOSIS — M6281 Muscle weakness (generalized): Secondary | ICD-10-CM | POA: Diagnosis not present

## 2024-05-13 DIAGNOSIS — R2681 Unsteadiness on feet: Secondary | ICD-10-CM

## 2024-05-16 ENCOUNTER — Ambulatory Visit

## 2024-05-16 DIAGNOSIS — M6281 Muscle weakness (generalized): Secondary | ICD-10-CM

## 2024-05-16 DIAGNOSIS — R2681 Unsteadiness on feet: Secondary | ICD-10-CM

## 2024-05-16 DIAGNOSIS — R2689 Other abnormalities of gait and mobility: Secondary | ICD-10-CM

## 2024-05-16 DIAGNOSIS — S7292XD Unspecified fracture of left femur, subsequent encounter for closed fracture with routine healing: Secondary | ICD-10-CM | POA: Diagnosis not present

## 2024-05-16 NOTE — Therapy (Signed)
 OUTPATIENT PHYSICAL THERAPY TREATMENT NOTE   Patient Name: Veronica Barrett MRN: [id number] DOB:1951-08-27, 73 y.o., female Today's Date: 05/16/2024  END OF SESSION:  PT End of Session - 05/16/24 1047     Visit Number 5    Number of Visits 12    Date for PT Re-Evaluation 06/20/24    Authorization Type MCR    PT Start Time 1045    PT Stop Time 1125    PT Time Calculation (min) 40 min    Activity Tolerance Patient tolerated treatment well;Patient limited by pain    Behavior During Therapy [facility] for tasks assessed/performed             Past Medical History:  Diagnosis Date   Allergy    seasonal allergy   Arthritis    left hip   Asthma    Depression    HSV infection    vag   Past Surgical History:  Procedure Laterality Date   APPENDECTOMY  11/17/1982   EYE SURGERY Left    left vitreous   LEG SURGERY Left 01/10/2024   TOTAL HIP ARTHROPLASTY Left 10/17/2014   Procedure: LEFT TOTAL HIP ARTHROPLASTY ANTERIOR APPROACH;  Surgeon: Donnice JONETTA Car, MD;  Location: [facility];  Service: Orthopedics;  Laterality: Left;   Patient Active Problem List   Diagnosis Date Noted   Osteoporosis without current pathological fracture 10/12/2023   Moderate persistent asthma 08/10/2023   Bronchitis 06/18/2021   Leukocytosis 06/17/2021   Environmental and seasonal allergies 10/07/2018   Chronic sinusitis 10/07/2018   H/O sinus surgery 10/07/2018   Hyperlipidemia 10/07/2018   S/P left THA, AA 10/17/2014   Recurrent HSV (herpes simplex virus) 04/21/2013   Depression, recurrent (HCC) 02/20/2010   Allergic rhinitis 04/16/2007    PCP: Wallace Joesph LABOR, PA   REFERRING PROVIDER: Chandra Toribio POUR, MD  REFERRING DIAG: [phone] (ICD-10-CM) - Closed fracture of left femur with routine healing, unspecified fracture morphology, unspecified portion of femur, subsequent encounter  THERAPY DIAG:  Unsteadiness on feet  Muscle weakness (generalized)  Other abnormalities of gait and  mobility  Rationale for Evaluation and Treatment: Rehabilitation  ONSET DATE: 01/10/24 DOS  SUBJECTIVE:   SUBJECTIVE STATEMENT: Mildly sore from last session but no sharp pain reported.   PERTINENT HISTORY: HPI: Veronica Barrett 73 y.o. female with past medical history of moderate persistent asthma, hyperlipidemia and depression who presented to the emergency department after a mechanical fall. Patient's fall led to direct strike of her left knee to the ground. Patient noted severe pain and inability to ambulate at that time. Imaging revealed a left distal femoral shaft fracture   Patient was hospitalized for evaluation and management of left distal 3rd femur shaft fracture. Orthopedic surgery evaluated in consultation and she underwent left femur ORIF on 01/10/2024. She has been evaluated by PT/OT postoperatively with recommendations for acute rehab. Pt is medically stable and has been evaluated and found to be appropriate for [facility] and admitted.  PAIN:  Are you having pain? Yes: NPRS scale: 3/10 Pain location: L thigh Pain description: ache Aggravating factors: stairs, prolonged activity Relieving factors: rest   PRECAUTIONS: Fall  RED FLAGS: None   WEIGHT BEARING RESTRICTIONS: No  FALLS:  Has patient fallen in last 6 months? No  OCCUPATION: retired  PLOF: Independent with household mobility with device  PATIENT GOALS: To manage my symptoms and wean of the cane  NEXT MD VISIT: 05/05/23  OBJECTIVE:  Note: Objective measures were completed at Evaluation unless otherwise noted.  DIAGNOSTIC  FINDINGS: none available  PATIENT SURVEYS:  LEFS   MUSCLE LENGTH: Hamstrings: WFL Thomas test: No restriction  POSTURE: No Significant postural limitations  PALPATION: negative  LOWER EXTREMITY ROM:  Active ROM Right eval Left eval  Hip flexion    Hip extension    Hip abduction    Hip adduction    Hip internal rotation    Hip external rotation    Knee flexion 125d 118d   Knee extension 0d 0d  Ankle dorsiflexion    Ankle plantarflexion    Ankle inversion    Ankle eversion     (Blank rows = not tested)  LOWER EXTREMITY MMT:  MMT Right eval Left eval  Hip flexion    Hip extension    Hip abduction  4-  Hip adduction    Hip internal rotation    Hip external rotation    Knee flexion  4-  Knee extension  4-  Ankle dorsiflexion    Ankle plantarflexion    Ankle inversion    Ankle eversion     (Blank rows = not tested)  LOWER EXTREMITY SPECIAL TESTS:  deferred  FUNCTIONAL TESTS:  30 seconds chair stand test 9 reps w/o Ues   GAIT: Distance walked: 60ftx2 Assistive device utilized: Single point cane Level of assistance: Complete Independence Comments: unremarkable                                                                                                                                TREATMENT:  [facility] Adult PT Treatment:                                                DATE: 05/16/34 Therapeutic Exercise: Nustep L4 6 min Neuromuscular re-ed: Supine hip fallouts GTB 15x B, 15/15 unilaterally Bridge against GTB 15x L clams in R S/L 15x GTB P-ball curl ups 15x B, 15/15 unilaterally Therapeutic Activity: 2 MWT 383ft w/o cane STS from airex 10x Runners step 4 in 10/10 needing UE Support  [facility] Adult PT Treatment:                                                DATE: 05/13/24 Therapeutic Exercise: Nustep L3 8 min Neuromuscular re-ed: Supine hip fallouts GTB 15x B, 15/15 unilaterally Bridge against GTB 15x L clams in R S/L 15x GTB P-ball curl ups 10x B, 10/10 unilaterally Therapeutic Activity: Seated L hamstring stretch 30s x2 Supine L hip flexor stretch 30s x2 L QL stretch 30s x2  [facility] Adult PT Treatment:  DATE: 05/03/2024  Therapeutic Exercise: NuStep  8'  Seated L hamstring stretch w/APT and hip ER 2x1' Therapeutic Activity: Supine hip fall out 2x12 B, hold 2s, GTB Supine bridge  w/GTB 2x12, hold 4s Standing hip abduction 2x12, hold 2s Split stance LLE TKE 1x12, hold 3s   [facility] Adult PT Treatment:                                                DATE: 04/28/24 Therapeutic Exercise: Nustep L2 8 min Seated L hamstring stretch 30s x2 Supine L hip flexor stretch 30s x2 Neuromuscular re-ed: Supine hip fallouts RTB 15x B, 15/15 unilaterally Bridge against RTB S/L clams RTB 15/15 Therapeutic Activity: STS from airex pad PPT 3s 15x PPT with LE march 10x  Bridge with ball 15x   PATIENT EDUCATION:  Education details: Discussed eval findings, rehab rationale and POC and patient is in agreement  Person educated: Patient Education method: Explanation and Handouts Education comprehension: verbalized understanding and needs further education  HOME EXERCISE PROGRAM: Access Code: [email or web address] URL: [email or web address] Date: 04/20/2024 Prepared by: Reyes Kohut  Exercises - Supine Quad Set  - 2-3 x daily - 5 x weekly - 1 sets - 10 reps - 3s hold - Small Range Straight Leg Raise  - 2-3 x daily - 5 x weekly - 1 sets - 10 reps - Clamshell with Resistance  - 2-3 x daily - 5 x weekly - 1 sets - 10 reps  ASSESSMENT:  CLINICAL IMPRESSION: Today's session continued to address strength issues in L hip.  No increase in t-band resistance to allow patient to accommodate soreness.  Advanced to runners step to incorporate SLS tasks. Added STS from compliaant surfaces and assessed 2 MWT.    Patient is a 73 y.o. female who was seen today for physical therapy evaluation and treatment for L LE weakness and dysfunction following fracture and ORIF 01/10/24.  Strength deficits note with 30s chair stand test, no leg length identified, extensor lag observed with SLR, L abduction strength limited.  Patient is a good candidate for OPPT to resolve strength, balance and ROM deficits and wean off cane when appropriate  OBJECTIVE IMPAIRMENTS: Abnormal gait, decreased activity  tolerance, decreased balance, decreased endurance, decreased mobility, difficulty walking, decreased ROM, decreased strength, and pain.   ACTIVITY LIMITATIONS: carrying, lifting, standing, squatting, and stairs  PERSONAL FACTORS: Age, Past/current experiences, and Time since onset of injury/illness/exacerbation are also affecting patient's functional outcome.   REHAB POTENTIAL: Good  CLINICAL DECISION MAKING: Stable/uncomplicated  EVALUATION COMPLEXITY: Low   GOALS: Goals reviewed with patient? No  SHORT TERM GOALS: Target date: 05/11/2024   Patient to demonstrate independence in HEP  Baseline: HVAJZYRF Goal status: INITIAL  2.  Assess 2 MWT Baseline: TBD; 05/16/24 332ft w/o cane Goal status: INITIAL   LONG TERM GOALS: Target date: 06/01/2024    Patient will increase 30s chair stand reps from 9 to 12 without arms to demonstrate and improved functional ability with less pain/difficulty as well as reduce fall risk.  Baseline: 9 Goal status: INITIAL  2.  Patient will acknowledge 2/10 pain at least once during episode of care   Baseline: 4/10 Goal status: INITIAL  3.  Patient will score at least 60/80 on LEFS to signify clinically meaningful improvement in functional abilities.   Baseline: 36/80 Goal status: INITIAL  4.  Increase L hip  and knee strength to 4/5 in deficit regions Baseline: 4-/5 knee and hip abduction strength Goal status: INITIAL  5.  125d L knee flexion Baseline: 118d L knee flexion Goal status: INITIAL  6.  Patient to negotiate 16 steps with most appropriate pattern Baseline: TBD Goal status: INITIAL   PLAN:  PT FREQUENCY: 2x/week  PT DURATION: 6 weeks  PLANNED INTERVENTIONS: 97110-Therapeutic exercises, 97530- Therapeutic activity, 97112- Neuromuscular re-education, 97535- Self Care, 02859- Manual therapy, [phone]- Gait training, [phone]- Aquatic Therapy, Patient/Family education, Balance training, and Stair training  PLAN FOR NEXT SESSION: HEP  review and update, manual techniques as appropriate, aerobic tasks, ROM and flexibility activities, strengthening and PREs, TPDN, gait and balance training as needed     Jeff Joaquina Nissen PT  05/16/2024, 11:36 AM

## 2024-05-17 NOTE — Therapy (Signed)
 OUTPATIENT PHYSICAL THERAPY TREATMENT NOTE   Patient Name: Veronica Barrett MRN: [id number] DOB:[birth year], 73 y.o., female Today's Date: 05/18/2024  END OF SESSION:  PT End of Session - 05/18/24 1223     Visit Number 6    Number of Visits 12    Date for PT Re-Evaluation 06/20/24    Authorization Type MCR    PT Start Time 1230    PT Stop Time 1316    PT Time Calculation (min) 46 min    Activity Tolerance Patient tolerated treatment well;Patient limited by pain    Behavior During Therapy [facility] for tasks assessed/performed              Past Medical History:  Diagnosis Date   Allergy    seasonal allergy   Arthritis    left hip   Asthma    Depression    HSV infection    vag   Past Surgical History:  Procedure Laterality Date   APPENDECTOMY  11/17/1982   EYE SURGERY Left    left vitreous   LEG SURGERY Left 01/10/2024   TOTAL HIP ARTHROPLASTY Left 10/17/2014   Procedure: LEFT TOTAL HIP ARTHROPLASTY ANTERIOR APPROACH;  Surgeon: Donnice JONETTA Car, MD;  Location: [facility];  Service: Orthopedics;  Laterality: Left;   Patient Active Problem List   Diagnosis Date Noted   Osteoporosis without current pathological fracture 10/12/2023   Moderate persistent asthma 08/10/2023   Bronchitis 06/18/2021   Leukocytosis 06/17/2021   Environmental and seasonal allergies 10/07/2018   Chronic sinusitis 10/07/2018   H/O sinus surgery 10/07/2018   Hyperlipidemia 10/07/2018   S/P left THA, AA 10/17/2014   Recurrent HSV (herpes simplex virus) 04/21/2013   Depression, recurrent (HCC) 02/20/2010   Allergic rhinitis 04/16/2007    PCP: Wallace Joesph LABOR, PA   REFERRING PROVIDER: Chandra Toribio POUR, MD  REFERRING DIAG: [phone] (ICD-10-CM) - Closed fracture of left femur with routine healing, unspecified fracture morphology, unspecified portion of femur, subsequent encounter  THERAPY DIAG:  Unsteadiness on feet  Muscle weakness (generalized)  Other abnormalities of gait and  mobility  Rationale for Evaluation and Treatment: Rehabilitation  ONSET DATE: 01/10/24 DOS  SUBJECTIVE:   SUBJECTIVE STATEMENT: Mildly sore but reports having more pain while doing steps.  Pt reports 2/10.   PERTINENT HISTORY: HPI: Veronica Barrett 73 y.o. female with past medical history of moderate persistent asthma, hyperlipidemia and depression who presented to the emergency department after a mechanical fall. Patient's fall led to direct strike of her left knee to the ground. Patient noted severe pain and inability to ambulate at that time. Imaging revealed a left distal femoral shaft fracture   Patient was hospitalized for evaluation and management of left distal 3rd femur shaft fracture. Orthopedic surgery evaluated in consultation and she underwent left femur ORIF on 01/10/2024. She has been evaluated by PT/OT postoperatively with recommendations for acute rehab. Pt is medically stable and has been evaluated and found to be appropriate for [facility] and admitted.  PAIN:  Are you having pain? Yes: NPRS scale: 3/10 Pain location: L thigh Pain description: ache Aggravating factors: stairs, prolonged activity Relieving factors: rest   PRECAUTIONS: Fall  RED FLAGS: None   WEIGHT BEARING RESTRICTIONS: No  FALLS:  Has patient fallen in last 6 months? No  OCCUPATION: retired  PLOF: Independent with household mobility with device  PATIENT GOALS: To manage my symptoms and wean of the cane  NEXT MD VISIT: 05/05/23  OBJECTIVE:  Note: Objective measures were completed at Evaluation  unless otherwise noted.  DIAGNOSTIC FINDINGS: none available  PATIENT SURVEYS:  LEFS   MUSCLE LENGTH: Hamstrings: WFL Thomas test: No restriction  POSTURE: No Significant postural limitations  PALPATION: negative  LOWER EXTREMITY ROM:  Active ROM Right eval Left eval  Hip flexion    Hip extension    Hip abduction    Hip adduction    Hip internal rotation    Hip external rotation    Knee  flexion 125d 118d  Knee extension 0d 0d  Ankle dorsiflexion    Ankle plantarflexion    Ankle inversion    Ankle eversion     (Blank rows = not tested)  LOWER EXTREMITY MMT:  MMT Right eval Left eval  Hip flexion    Hip extension    Hip abduction  4-  Hip adduction    Hip internal rotation    Hip external rotation    Knee flexion  4-  Knee extension  4-  Ankle dorsiflexion    Ankle plantarflexion    Ankle inversion    Ankle eversion     (Blank rows = not tested)  LOWER EXTREMITY SPECIAL TESTS:  deferred  FUNCTIONAL TESTS:  30 seconds chair stand test 9 reps w/o Ues   GAIT: Distance walked: 97ftx2 Assistive device utilized: Single point cane Level of assistance: Complete Independence Comments: unremarkable                                                                                                                                TREATMENT:  [facility] Adult PT Treatment:                                                DATE: 05-18-24 Pt enters building ambulating independently with SPC. Treatment took place in water 3.8 to  4 ft 8 in. deep depending upon activity.  Pt entered and exited the pool via stair and handrails independently. Temperature 90 degrees Patient entered water for aquatic therapy for first time and was introduced to principles and therapeutic effects of water as she ambulated and acclimated to pool.    Aquatic Exercise: Initially ambulating forward , backward and side stepping to acclimate to water Standing with UE support edge of pool: Aqua stretch to left lateral thigh with decreased muscle tension Hip hinge to submerged bench with even wt bearing on bil LE with VC Hip abd/add  Hip ext/flex with knee straight  L back stretch Hamstring curl BIL Squats 2x15 Heel raises x20 Runners stretch bottom step x30 BIL Hamstring stretch bottom step x30 BIL Hip Extension Stomps 3 way hip stretch with yellow noodle for IT band and adductor and  hamstring Sitting on submerged bench Bicycle kicks x1' Reverse Bicycle kicks 1 ' Flutter kicks x 1 '  Scissor kicks x 1'  Pt requires  the buoyancy of water for active assisted exercises with buoyancy supported for strengthening and AROM exercises. Hydrostatic pressure also supports joints by unweighting joint load by at least 50 % in 3-4 feet depth water. 80% in chest to neck deep water. Water will provide assistance with movement using the current and laminar flow while the buoyancy reduces weight bearing. Pt requires the viscosity of the water for resistance with strengthening exercises.  [facility] Adult PT Treatment:                                                DATE: 05/16/34 Therapeutic Exercise: Nustep L4 6 min Neuromuscular re-ed: Supine hip fallouts GTB 15x B, 15/15 unilaterally Bridge against GTB 15x L clams in R S/L 15x GTB P-ball curl ups 15x B, 15/15 unilaterally Therapeutic Activity: 2 MWT 362ft w/o cane STS from airex 10x Runners step 4 in 10/10 needing UE Support  [facility] Adult PT Treatment:                                                DATE: 05/13/24 Therapeutic Exercise: Nustep L3 8 min Neuromuscular re-ed: Supine hip fallouts GTB 15x B, 15/15 unilaterally Bridge against GTB 15x L clams in R S/L 15x GTB P-ball curl ups 10x B, 10/10 unilaterally Therapeutic Activity: Seated L hamstring stretch 30s x2 Supine L hip flexor stretch 30s x2 L QL stretch 30s x2  [facility] Adult PT Treatment:                                                DATE: 05/03/2024  Therapeutic Exercise: NuStep  8'  Seated L hamstring stretch w/APT and hip ER 2x1' Therapeutic Activity: Supine hip fall out 2x12 B, hold 2s, GTB Supine bridge w/GTB 2x12, hold 4s Standing hip abduction 2x12, hold 2s Split stance LLE TKE 1x12, hold 3s   [facility] Adult PT Treatment:                                                DATE: 04/28/24 Therapeutic Exercise: Nustep L2 8 min Seated L hamstring stretch 30s  x2 Supine L hip flexor stretch 30s x2 Neuromuscular re-ed: Supine hip fallouts RTB 15x B, 15/15 unilaterally Bridge against RTB S/L clams RTB 15/15 Therapeutic Activity: STS from airex pad PPT 3s 15x PPT with LE march 10x  Bridge with ball 15x   PATIENT EDUCATION:  Education details: Discussed eval findings, rehab rationale and POC and patient is in agreement  Person educated: Patient Education method: Explanation and Handouts Education comprehension: verbalized understanding and needs further education  HOME EXERCISE PROGRAM: Access Code: [email or web address] URL: [email or web address] Date: 04/20/2024 Prepared by: Reyes Kohut  Exercises - Supine Quad Set  - 2-3 x daily - 5 x weekly - 1 sets - 10 reps - 3s hold - Small Range Straight Leg Raise  - 2-3 x daily - 5 x weekly - 1 sets - 10 reps - Clamshell with Resistance  -  2-3 x daily - 5 x weekly - 1 sets - 10 reps  ASSESSMENT:  CLINICAL IMPRESSION: Patient presents to first aquatic PT session reporting  2/10 pain and pt comments that she has reduced pain in the water.  Session today focused on establishing aquatic HEP, general strengthening  tasks in the aquatic environment for use of buoyancy to offload joints and the viscosity of water as resistance during therapeutic exercise. Patient was able to tolerate all prescribed exercises in the aquatic environment with no adverse effects. Adelheid tolerated aqua stretch with resultant decreased tissue tension in left lateral thigh post RX. Patient continues to benefit from skilled PT services on land and aquatic based and should be progressed as able to improve functional independence    Patient is a 73 y.o. female who was seen today for physical therapy evaluation and treatment for L LE weakness and dysfunction following fracture and ORIF 01/10/24.  Strength deficits note with 30s chair stand test, no leg length identified, extensor lag observed with SLR, L abduction strength limited.   Patient is a good candidate for OPPT to resolve strength, balance and ROM deficits and wean off cane when appropriate  OBJECTIVE IMPAIRMENTS: Abnormal gait, decreased activity tolerance, decreased balance, decreased endurance, decreased mobility, difficulty walking, decreased ROM, decreased strength, and pain.   ACTIVITY LIMITATIONS: carrying, lifting, standing, squatting, and stairs  PERSONAL FACTORS: Age, Past/current experiences, and Time since onset of injury/illness/exacerbation are also affecting patient's functional outcome.   REHAB POTENTIAL: Good  CLINICAL DECISION MAKING: Stable/uncomplicated  EVALUATION COMPLEXITY: Low   GOALS: Goals reviewed with patient? No  SHORT TERM GOALS: Target date: 05/11/2024   Patient to demonstrate independence in HEP  Baseline: [email or web address] Goal status: INITIAL  2.  Assess 2 MWT Baseline: TBD; 05/16/24 337ft w/o cane Goal status: INITIAL   LONG TERM GOALS: Target date: 06/01/2024    Patient will increase 30s chair stand reps from 9 to 12 without arms to demonstrate and improved functional ability with less pain/difficulty as well as reduce fall risk.  Baseline: 9 Goal status: INITIAL  2.  Patient will acknowledge 2/10 pain at least once during episode of care   Baseline: 4/10 Goal status: INITIAL  3.  Patient will score at least 60/80 on LEFS to signify clinically meaningful improvement in functional abilities.   Baseline: 36/80 Goal status: INITIAL  4.  Increase L hip and knee strength to 4/5 in deficit regions Baseline: 4-/5 knee and hip abduction strength Goal status: INITIAL  5.  125d L knee flexion Baseline: 118d L knee flexion Goal status: INITIAL  6.  Patient to negotiate 16 steps with most appropriate pattern Baseline: TBD Goal status: INITIAL   PLAN:  PT FREQUENCY: 2x/week  PT DURATION: 6 weeks  PLANNED INTERVENTIONS: 97110-Therapeutic exercises, 97530- Therapeutic activity, 97112- Neuromuscular re-education,  97535- Self Care, [id number]- Manual therapy, [phone]- Gait training, [phone]- Aquatic Therapy, Patient/Family education, Balance training, and Stair training  PLAN FOR NEXT SESSION: HEP review and update, manual techniques as appropriate, aerobic tasks, ROM and flexibility activities, strengthening and PREs, TPDN, gait and balance training as needed     Graydon Dingwall, PT, ATRIC Certified Exercise Expert for the Aging Adult  05/18/24 2:35 PM Phone: [phone] Fax: [phone]   05/18/2024, 2:35 PM

## 2024-05-18 ENCOUNTER — Ambulatory Visit: Attending: Family Medicine | Admitting: Physical Therapy

## 2024-05-18 ENCOUNTER — Encounter: Payer: Self-pay | Admitting: Physical Therapy

## 2024-05-18 DIAGNOSIS — R2689 Other abnormalities of gait and mobility: Secondary | ICD-10-CM | POA: Insufficient documentation

## 2024-05-18 DIAGNOSIS — R2681 Unsteadiness on feet: Secondary | ICD-10-CM | POA: Insufficient documentation

## 2024-05-18 DIAGNOSIS — M6281 Muscle weakness (generalized): Secondary | ICD-10-CM | POA: Diagnosis not present

## 2024-05-23 ENCOUNTER — Ambulatory Visit

## 2024-05-23 DIAGNOSIS — M6281 Muscle weakness (generalized): Secondary | ICD-10-CM | POA: Diagnosis not present

## 2024-05-23 DIAGNOSIS — R2681 Unsteadiness on feet: Secondary | ICD-10-CM

## 2024-05-23 DIAGNOSIS — R2689 Other abnormalities of gait and mobility: Secondary | ICD-10-CM | POA: Diagnosis not present

## 2024-05-23 NOTE — Therapy (Signed)
 OUTPATIENT PHYSICAL THERAPY TREATMENT NOTE   Patient Name: Veronica Barrett MRN: [id number] DOB:1951-04-05, 73 y.o., female Today's Date: 05/23/2024  END OF SESSION:  PT End of Session - 05/23/24 1320     Visit Number 7    Number of Visits 12    Date for PT Re-Evaluation 06/20/24    Authorization Type MCR    PT Start Time 1315    PT Stop Time 1400    PT Time Calculation (min) 45 min    Activity Tolerance Patient tolerated treatment well;Patient limited by pain    Behavior During Therapy [facility] for tasks assessed/performed              Past Medical History:  Diagnosis Date   Allergy    seasonal allergy   Arthritis    left hip   Asthma    Depression    HSV infection    vag   Past Surgical History:  Procedure Laterality Date   APPENDECTOMY  11/17/1982   EYE SURGERY Left    left vitreous   LEG SURGERY Left 01/10/2024   TOTAL HIP ARTHROPLASTY Left 10/17/2014   Procedure: LEFT TOTAL HIP ARTHROPLASTY ANTERIOR APPROACH;  Surgeon: Donnice JONETTA Car, MD;  Location: [facility];  Service: Orthopedics;  Laterality: Left;   Patient Active Problem List   Diagnosis Date Noted   Osteoporosis without current pathological fracture 10/12/2023   Moderate persistent asthma 08/10/2023   Bronchitis 06/18/2021   Leukocytosis 06/17/2021   Environmental and seasonal allergies 10/07/2018   Chronic sinusitis 10/07/2018   H/O sinus surgery 10/07/2018   Hyperlipidemia 10/07/2018   S/P left THA, AA 10/17/2014   Recurrent HSV (herpes simplex virus) 04/21/2013   Depression, recurrent (HCC) 02/20/2010   Allergic rhinitis 04/16/2007    PCP: Wallace Joesph LABOR, PA   REFERRING PROVIDER: Chandra Toribio POUR, MD  REFERRING DIAG: [phone] (ICD-10-CM) - Closed fracture of left femur with routine healing, unspecified fracture morphology, unspecified portion of femur, subsequent encounter  THERAPY DIAG:  Muscle weakness (generalized)  Other abnormalities of gait and mobility  Unsteadiness on  feet  Rationale for Evaluation and Treatment: Rehabilitation  ONSET DATE: 01/10/24 DOS  SUBJECTIVE:   SUBJECTIVE STATEMENT: No new c/o.  Felt aquatic PT helpful as it allowed her to perform new tasks.   PERTINENT HISTORY: HPI: Veronica Barrett 73 y.o. female with past medical history of moderate persistent asthma, hyperlipidemia and depression who presented to the emergency department after a mechanical fall. Patient's fall led to direct strike of her left knee to the ground. Patient noted severe pain and inability to ambulate at that time. Imaging revealed a left distal femoral shaft fracture   Patient was hospitalized for evaluation and management of left distal 3rd femur shaft fracture. Orthopedic surgery evaluated in consultation and she underwent left femur ORIF on 01/10/2024. She has been evaluated by PT/OT postoperatively with recommendations for acute rehab. Pt is medically stable and has been evaluated and found to be appropriate for [facility] and admitted.  PAIN:  Are you having pain? Yes: NPRS scale: 3/10 Pain location: L thigh Pain description: ache Aggravating factors: stairs, prolonged activity Relieving factors: rest   PRECAUTIONS: Fall  RED FLAGS: None   WEIGHT BEARING RESTRICTIONS: No  FALLS:  Has patient fallen in last 6 months? No  OCCUPATION: retired  PLOF: Independent with household mobility with device  PATIENT GOALS: To manage my symptoms and wean of the cane  NEXT MD VISIT: 05/05/23  OBJECTIVE:  Note: Objective measures were completed  at Evaluation unless otherwise noted.  DIAGNOSTIC FINDINGS: none available  PATIENT SURVEYS:  LEFS   MUSCLE LENGTH: Hamstrings: WFL Thomas test: No restriction  POSTURE: No Significant postural limitations  PALPATION: negative  LOWER EXTREMITY ROM:  Active ROM Right eval Left eval  Hip flexion    Hip extension    Hip abduction    Hip adduction    Hip internal rotation    Hip external rotation    Knee flexion  125d 118d  Knee extension 0d 0d  Ankle dorsiflexion    Ankle plantarflexion    Ankle inversion    Ankle eversion     (Blank rows = not tested)  LOWER EXTREMITY MMT:  MMT Right eval Left eval  Hip flexion    Hip extension    Hip abduction  4-  Hip adduction    Hip internal rotation    Hip external rotation    Knee flexion  4-  Knee extension  4-  Ankle dorsiflexion    Ankle plantarflexion    Ankle inversion    Ankle eversion     (Blank rows = not tested)  LOWER EXTREMITY SPECIAL TESTS:  deferred  FUNCTIONAL TESTS:  30 seconds chair stand test 9 reps w/o Ues   GAIT: Distance walked: 64ftx2 Assistive device utilized: Single point cane Level of assistance: Complete Independence Comments: unremarkable                                                                                                                                TREATMENT:  [facility] Adult PT Treatment:                                                DATE: 05/23/24 Therapeutic Exercise: Nustep L5 8 min Neuromuscular re-ed: Supine hip fallouts GTB 15x B, 15/15 unilaterally Bridge against GTB 15x L clams in R S/L 15x GTB P-ball curl ups 15x B, 15/15 unilaterally STS from airex pad 10x  Therapeutic Activity: Seated L hamstring stretch 30s x2 Supine L hip flexor stretch 30s x2 L QL stretch 30s x2 FAQs with ball squeeze 15x   [facility] Adult PT Treatment:                                                DATE: 05-18-24 Pt enters building ambulating independently with SPC. Treatment took place in water 3.8 to  4 ft 8 in. deep depending upon activity.  Pt entered and exited the pool via stair and handrails independently. Temperature 90 degrees Patient entered water for aquatic therapy for first time and was introduced to principles and therapeutic effects of water as she ambulated and acclimated to pool.  Aquatic Exercise: Initially ambulating forward , backward and side stepping to acclimate to water Standing with  UE support edge of pool: Aqua stretch to left lateral thigh with decreased muscle tension Hip hinge to submerged bench with even wt bearing on bil LE with VC Hip abd/add  Hip ext/flex with knee straight  L back stretch Hamstring curl BIL Squats 2x15 Heel raises x20 Runners stretch bottom step x30 BIL Hamstring stretch bottom step x30 BIL Hip Extension Stomps 3 way hip stretch with yellow noodle for IT band and adductor and hamstring Sitting on submerged bench Bicycle kicks x1' Reverse Bicycle kicks 1 ' Flutter kicks x 1 '  Scissor kicks x 1'  Pt requires the buoyancy of water for active assisted exercises with buoyancy supported for strengthening and AROM exercises. Hydrostatic pressure also supports joints by unweighting joint load by at least 50 % in 3-4 feet depth water. 80% in chest to neck deep water. Water will provide assistance with movement using the current and laminar flow while the buoyancy reduces weight bearing. Pt requires the viscosity of the water for resistance with strengthening exercises.  [facility] Adult PT Treatment:                                                DATE: 05/16/34 Therapeutic Exercise: Nustep L4 6 min Neuromuscular re-ed: Supine hip fallouts GTB 15x B, 15/15 unilaterally Bridge against GTB 15x L clams in R S/L 15x GTB P-ball curl ups 15x B, 15/15 unilaterally Therapeutic Activity: 2 MWT 364ft w/o cane STS from airex 10x Runners step 4 in 10/10 needing UE Support  [facility] Adult PT Treatment:                                                DATE: 05/13/24 Therapeutic Exercise: Nustep L3 8 min Neuromuscular re-ed: Supine hip fallouts GTB 15x B, 15/15 unilaterally Bridge against GTB 15x L clams in R S/L 15x GTB P-ball curl ups 10x B, 10/10 unilaterally Therapeutic Activity: Seated L hamstring stretch 30s x2 Supine L hip flexor stretch 30s x2 L QL stretch 30s x2  [facility] Adult PT Treatment:                                                DATE:  05/03/2024  Therapeutic Exercise: NuStep  8'  Seated L hamstring stretch w/APT and hip ER 2x1' Therapeutic Activity: Supine hip fall out 2x12 B, hold 2s, GTB Supine bridge w/GTB 2x12, hold 4s Standing hip abduction 2x12, hold 2s Split stance LLE TKE 1x12, hold 3s   [facility] Adult PT Treatment:                                                DATE: 04/28/24 Therapeutic Exercise: Nustep L2 8 min Seated L hamstring stretch 30s x2 Supine L hip flexor stretch 30s x2 Neuromuscular re-ed: Supine hip fallouts RTB 15x B, 15/15 unilaterally Bridge against RTB S/L clams RTB 15/15 Therapeutic  Activity: STS from airex pad PPT 3s 15x PPT with LE march 10x  Bridge with ball 15x   PATIENT EDUCATION:  Education details: Discussed eval findings, rehab rationale and POC and patient is in agreement  Person educated: Patient Education method: Chief Technology Officer Education comprehension: verbalized understanding and needs further education  HOME EXERCISE PROGRAM: Access Code: [email or web address] URL: [email or web address] Date: 04/20/2024 Prepared by: Reyes Kohut  Exercises - Supine Quad Set  - 2-3 x daily - 5 x weekly - 1 sets - 10 reps - 3s hold - Small Range Straight Leg Raise  - 2-3 x daily - 5 x weekly - 1 sets - 10 reps - Clamshell with Resistance  - 2-3 x daily - 5 x weekly - 1 sets - 10 reps  ASSESSMENT:  CLINICAL IMPRESSION: Focus of today's session was continued strengthening and stability training of L hip region.  Continued with green t-band resistance but added tension to Nustep.  Less cervical discomfort reported with curl ups as strength improves.  Abductor weakness observed with eccentric STS as evidenced by valgus collapse.   Patient is a 73 y.o. female who was seen today for physical therapy evaluation and treatment for L LE weakness and dysfunction following fracture and ORIF 01/10/24.  Strength deficits note with 30s chair stand test, no leg length identified,  extensor lag observed with SLR, L abduction strength limited.  Patient is a good candidate for OPPT to resolve strength, balance and ROM deficits and wean off cane when appropriate  OBJECTIVE IMPAIRMENTS: Abnormal gait, decreased activity tolerance, decreased balance, decreased endurance, decreased mobility, difficulty walking, decreased ROM, decreased strength, and pain.   ACTIVITY LIMITATIONS: carrying, lifting, standing, squatting, and stairs  PERSONAL FACTORS: Age, Past/current experiences, and Time since onset of injury/illness/exacerbation are also affecting patient's functional outcome.   REHAB POTENTIAL: Good  CLINICAL DECISION MAKING: Stable/uncomplicated  EVALUATION COMPLEXITY: Low   GOALS: Goals reviewed with patient? No  SHORT TERM GOALS: Target date: 05/11/2024   Patient to demonstrate independence in HEP  Baseline: [email or web address] Goal status: INITIAL  2.  Assess 2 MWT Baseline: TBD; 05/16/24 314ft w/o cane Goal status: INITIAL   LONG TERM GOALS: Target date: 06/01/2024    Patient will increase 30s chair stand reps from 9 to 12 without arms to demonstrate and improved functional ability with less pain/difficulty as well as reduce fall risk.  Baseline: 9 Goal status: INITIAL  2.  Patient will acknowledge 2/10 pain at least once during episode of care   Baseline: 4/10 Goal status: INITIAL  3.  Patient will score at least 60/80 on LEFS to signify clinically meaningful improvement in functional abilities.   Baseline: 36/80 Goal status: INITIAL  4.  Increase L hip and knee strength to 4/5 in deficit regions Baseline: 4-/5 knee and hip abduction strength Goal status: INITIAL  5.  125d L knee flexion Baseline: 118d L knee flexion Goal status: INITIAL  6.  Patient to negotiate 16 steps with most appropriate pattern Baseline: TBD Goal status: INITIAL   PLAN:  PT FREQUENCY: 2x/week  PT DURATION: 6 weeks  PLANNED INTERVENTIONS: 97110-Therapeutic exercises,  97530- Therapeutic activity, [id number]- Neuromuscular re-education, 97535- Self Care, [id number]- Manual therapy, [phone]- Gait training, [phone]- Aquatic Therapy, Patient/Family education, Balance training, and Stair training  PLAN FOR NEXT SESSION: HEP review and update, manual techniques as appropriate, aerobic tasks, ROM and flexibility activities, strengthening and PREs, TPDN, gait and balance training as needed     Graydon Dingwall, PT, [facility] Certified Exercise Expert for  the Aging Adult  05/23/24 1:56 PM Phone: [phone] Fax: [phone]   05/23/2024, 1:56 PM

## 2024-05-24 NOTE — Therapy (Signed)
 OUTPATIENT PHYSICAL THERAPY TREATMENT NOTE   Patient Name: Veronica Barrett MRN: [id number] DOB:10/06/1951, 73 y.o., female Today's Date: 05/25/2024  END OF SESSION:  PT End of Session - 05/25/24 1231     Visit Number 8    Number of Visits 12    Date for PT Re-Evaluation 06/20/24    Authorization Type MCR    PT Start Time 1230    PT Stop Time 1317    PT Time Calculation (min) 47 min    Activity Tolerance Patient tolerated treatment well;Patient limited by pain    Behavior During Therapy [facility] for tasks assessed/performed               Past Medical History:  Diagnosis Date   Allergy    seasonal allergy   Arthritis    left hip   Asthma    Depression    HSV infection    vag   Past Surgical History:  Procedure Laterality Date   APPENDECTOMY  11/17/1982   EYE SURGERY Left    left vitreous   LEG SURGERY Left 01/10/2024   TOTAL HIP ARTHROPLASTY Left 10/17/2014   Procedure: LEFT TOTAL HIP ARTHROPLASTY ANTERIOR APPROACH;  Surgeon: Donnice JONETTA Car, MD;  Location: [facility];  Service: Orthopedics;  Laterality: Left;   Patient Active Problem List   Diagnosis Date Noted   Osteoporosis without current pathological fracture 10/12/2023   Moderate persistent asthma 08/10/2023   Bronchitis 06/18/2021   Leukocytosis 06/17/2021   Environmental and seasonal allergies 10/07/2018   Chronic sinusitis 10/07/2018   H/O sinus surgery 10/07/2018   Hyperlipidemia 10/07/2018   S/P left THA, AA 10/17/2014   Recurrent HSV (herpes simplex virus) 04/21/2013   Depression, recurrent (HCC) 02/20/2010   Allergic rhinitis 04/16/2007    PCP: Wallace Joesph LABOR, PA   REFERRING PROVIDER: Chandra Toribio POUR, MD  REFERRING DIAG: [phone] (ICD-10-CM) - Closed fracture of left femur with routine healing, unspecified fracture morphology, unspecified portion of femur, subsequent encounter  THERAPY DIAG:  Muscle weakness (generalized)  Other abnormalities of gait and mobility  Unsteadiness on  feet  Rationale for Evaluation and Treatment: Rehabilitation  ONSET DATE: 01/10/24 DOS  SUBJECTIVE:   SUBJECTIVE STATEMENT: Pt states she feels  off kilter  Her left knee hurting more today. Enters with 2/10 pain today and left knee just irritating her.   PERTINENT HISTORY: HPI: Veronica Barrett 72 y.o. female with past medical history of moderate persistent asthma, hyperlipidemia and depression who presented to the emergency department after a mechanical fall. Patient's fall led to direct strike of her left knee to the ground. Patient noted severe pain and inability to ambulate at that time. Imaging revealed a left distal femoral shaft fracture   Patient was hospitalized for evaluation and management of left distal 3rd femur shaft fracture. Orthopedic surgery evaluated in consultation and she underwent left femur ORIF on 01/10/2024. She has been evaluated by PT/OT postoperatively with recommendations for acute rehab. Pt is medically stable and has been evaluated and found to be appropriate for [facility] and admitted.  PAIN:  Are you having pain? Yes: NPRS scale: 3/10 Pain location: L thigh Pain description: ache Aggravating factors: stairs, prolonged activity Relieving factors: rest   PRECAUTIONS: Fall  RED FLAGS: None   WEIGHT BEARING RESTRICTIONS: No  FALLS:  Has patient fallen in last 6 months? No  OCCUPATION: retired  PLOF: Independent with household mobility with device  PATIENT GOALS: To manage my symptoms and wean of the cane  NEXT MD  VISIT: 05/05/23  OBJECTIVE:  Note: Objective measures were completed at Evaluation unless otherwise noted.  DIAGNOSTIC FINDINGS: none available  PATIENT SURVEYS:  LEFS   MUSCLE LENGTH: Hamstrings: WFL Thomas test: No restriction  POSTURE: No Significant postural limitations  PALPATION: negative  LOWER EXTREMITY ROM:  Active ROM Right eval Left eval  Hip flexion    Hip extension    Hip abduction    Hip adduction    Hip  internal rotation    Hip external rotation    Knee flexion 125d 118d  Knee extension 0d 0d  Ankle dorsiflexion    Ankle plantarflexion    Ankle inversion    Ankle eversion     (Blank rows = not tested)  LOWER EXTREMITY MMT:  MMT Right eval Left eval  Hip flexion    Hip extension    Hip abduction  4-  Hip adduction    Hip internal rotation    Hip external rotation    Knee flexion  4-  Knee extension  4-  Ankle dorsiflexion    Ankle plantarflexion    Ankle inversion    Ankle eversion     (Blank rows = not tested)  LOWER EXTREMITY SPECIAL TESTS:  deferred  FUNCTIONAL TESTS:  30 seconds chair stand test 9 reps w/o Ues   GAIT: Distance walked: 75ftx2 Assistive device utilized: Single point cane Level of assistance: Complete Independence Comments: unremarkable                                                                                                                                TREATMENT:  [facility] Adult PT Treatment:                                                DATE: 05-25-24 Pt enters building ambulating independently with SPC. Treatment took place in water 3.8 to  4 ft 8 in. deep depending upon activity.  Pt entered and exited the pool via stair and handrails independently. Temperature 92 degrees. Initial pain 2/10 in left hip.   Aquatic Exercise: Initially ambulating forward , backward and side stepping to acclimate to water using yellow DB submerged in water.  Worked on forward progression and decreased side to side waddling. Standing with UE support edge of pool and using aquatic cuffs Aqua stretch to left lateral thigh and left buttock over left SI  with decreased muscle tension Hip Extension Stomps 3 way hip stretch with yellow noodle for IT band and adductor and hamstring Hip hinge to submerged bench with even wt bearing on bil LE with VC Hip abd/add  Hip ext/flex with knee straight  L back stretch Hamstring curl BIL Squats 2x15 Heel raises  x20 Runners stretch bottom step x30 BIL Hamstring stretch bottom step x30 BIL Up and down steps with alternating  steps and VC not to take steps one at time.   Pt requires the buoyancy of water for active assisted exercises with buoyancy supported for strengthening and AROM exercises. Hydrostatic pressure also supports joints by unweighting joint load by at least 50 % in 3-4 feet depth water. 80% in chest to neck deep water. Water will provide assistance with movement using the current and laminar flow while the buoyancy reduces weight bearing. Pt requires the viscosity of the water for resistance with strengthening exercises.    [facility] Adult PT Treatment:                                                DATE: 05/23/24 Therapeutic Exercise: Nustep L5 8 min Neuromuscular re-ed: Supine hip fallouts GTB 15x B, 15/15 unilaterally Bridge against GTB 15x L clams in R S/L 15x GTB P-ball curl ups 15x B, 15/15 unilaterally STS from airex pad 10x  Therapeutic Activity: Seated L hamstring stretch 30s x2 Supine L hip flexor stretch 30s x2 L QL stretch 30s x2 FAQs with ball squeeze 15x   [facility] Adult PT Treatment:                                                DATE: 05-18-24 Pt enters building ambulating independently with SPC. Treatment took place in water 3.8 to  4 ft 8 in. deep depending upon activity.  Pt entered and exited the pool via stair and handrails independently. Temperature 90 degrees Patient entered water for aquatic therapy for first time and was introduced to principles and therapeutic effects of water as she ambulated and acclimated to pool.    Aquatic Exercise: Initially ambulating forward , backward and side stepping to acclimate to water Standing with UE support edge of pool: Aqua stretch to left lateral thigh with decreased muscle tension Hip hinge to submerged bench with even wt bearing on bil LE with VC Hip abd/add  Hip ext/flex with knee straight  L back stretch Hamstring curl  BIL Squats 2x15 Heel raises x20 Runners stretch bottom step x30 BIL Hamstring stretch bottom step x30 BIL Hip Extension Stomps 3 way hip stretch with yellow noodle for IT band and adductor and hamstring Sitting on submerged bench Bicycle kicks x1' Reverse Bicycle kicks 1 ' Flutter kicks x 1 '  Scissor kicks x 1'  Pt requires the buoyancy of water for active assisted exercises with buoyancy supported for strengthening and AROM exercises. Hydrostatic pressure also supports joints by unweighting joint load by at least 50 % in 3-4 feet depth water. 80% in chest to neck deep water. Water will provide assistance with movement using the current and laminar flow while the buoyancy reduces weight bearing. Pt requires the viscosity of the water for resistance with strengthening exercises.  [facility] Adult PT Treatment:                                                DATE: 05/16/34 Therapeutic Exercise: Nustep L4 6 min Neuromuscular re-ed: Supine hip fallouts GTB 15x B, 15/15 unilaterally Bridge against GTB 15x L clams in R S/L  15x GTB P-ball curl ups 15x B, 15/15 unilaterally Therapeutic Activity: 2 MWT 333ft w/o cane STS from airex 10x Runners step 4 in 10/10 needing UE Support  [facility] Adult PT Treatment:                                                DATE: 05/13/24 Therapeutic Exercise: Nustep L3 8 min Neuromuscular re-ed: Supine hip fallouts GTB 15x B, 15/15 unilaterally Bridge against GTB 15x L clams in R S/L 15x GTB P-ball curl ups 10x B, 10/10 unilaterally Therapeutic Activity: Seated L hamstring stretch 30s x2 Supine L hip flexor stretch 30s x2 L QL stretch 30s x2  [facility] Adult PT Treatment:                                                DATE: 05/03/2024  Therapeutic Exercise: NuStep  8'  Seated L hamstring stretch w/APT and hip ER 2x1' Therapeutic Activity: Supine hip fall out 2x12 B, hold 2s, GTB Supine bridge w/GTB 2x12, hold 4s Standing hip abduction 2x12, hold 2s Split  stance LLE TKE 1x12, hold 3s   [facility] Adult PT Treatment:                                                DATE: 04/28/24 Therapeutic Exercise: Nustep L2 8 min Seated L hamstring stretch 30s x2 Supine L hip flexor stretch 30s x2 Neuromuscular re-ed: Supine hip fallouts RTB 15x B, 15/15 unilaterally Bridge against RTB S/L clams RTB 15/15 Therapeutic Activity: STS from airex pad PPT 3s 15x PPT with LE march 10x  Bridge with ball 15x   PATIENT EDUCATION:  Education details: Discussed eval findings, rehab rationale and POC and patient is in agreement  Person educated: Patient Education method: Explanation and Handouts Education comprehension: verbalized understanding and needs further education  HOME EXERCISE PROGRAM: Access Code: [email or web address] URL: [email or web address] Date: 04/20/2024 Prepared by: Reyes Kohut  Exercises - Supine Quad Set  - 2-3 x daily - 5 x weekly - 1 sets - 10 reps - 3s hold - Small Range Straight Leg Raise  - 2-3 x daily - 5 x weekly - 1 sets - 10 reps - Clamshell with Resistance  - 2-3 x daily - 5 x weekly - 1 sets - 10 reps  ASSESSMENT:  CLINICAL IMPRESSION:  05-25-24 Session today focused on hip and core strengthening in the aquatic environment for use of buoyancy to offload joints and the viscosity of water as resistance during therapeutic exercise. Pt show consistently improved activity tolerance session to session. Aqua Stretch used over Left buttocks and hip.  Pain mostly over left SI.  Patient was able to tolerate all prescribed exercises in the aquatic environment with no adverse effects and reports 2/10 pain initially and decreased but NPS number not specified at the end of the session. Pt working on ambulation in water with forward progression. Pt tend to wt shift L to R with decreased efficiently in kinetic chain.  Patient continues to benefit from skilled PT services on land and aquatic based and should be progressed as able to  improve  functional independence.    Patient is a 73 y.o. female who was seen today for physical therapy evaluation and treatment for L LE weakness and dysfunction following fracture and ORIF 01/10/24.  Strength deficits note with 30s chair stand test, no leg length identified, extensor lag observed with SLR, L abduction strength limited.  Patient is a good candidate for OPPT to resolve strength, balance and ROM deficits and wean off cane when appropriate  OBJECTIVE IMPAIRMENTS: Abnormal gait, decreased activity tolerance, decreased balance, decreased endurance, decreased mobility, difficulty walking, decreased ROM, decreased strength, and pain.   ACTIVITY LIMITATIONS: carrying, lifting, standing, squatting, and stairs  PERSONAL FACTORS: Age, Past/current experiences, and Time since onset of injury/illness/exacerbation are also affecting patient's functional outcome.   REHAB POTENTIAL: Good  CLINICAL DECISION MAKING: Stable/uncomplicated  EVALUATION COMPLEXITY: Low   GOALS: Goals reviewed with patient? No  SHORT TERM GOALS: Target date: 05/11/2024   Patient to demonstrate independence in HEP  Baseline: [email or web address] Goal status: INITIAL  2.  Assess 2 MWT Baseline: TBD; 05/16/24 376ft w/o cane Goal status: INITIAL   LONG TERM GOALS: Target date: 06/01/2024    Patient will increase 30s chair stand reps from 9 to 12 without arms to demonstrate and improved functional ability with less pain/difficulty as well as reduce fall risk.  Baseline: 9 Goal status: INITIAL  2.  Patient will acknowledge 2/10 pain at least once during episode of care   Baseline: 4/10 Goal status: INITIAL  3.  Patient will score at least 60/80 on LEFS to signify clinically meaningful improvement in functional abilities.   Baseline: 36/80 Goal status: INITIAL  4.  Increase L hip and knee strength to 4/5 in deficit regions Baseline: 4-/5 knee and hip abduction strength Goal status: INITIAL  5.  125d L knee  flexion Baseline: 118d L knee flexion Goal status: INITIAL  6.  Patient to negotiate 16 steps with most appropriate pattern Baseline: TBD Goal status: INITIAL   PLAN:  PT FREQUENCY: 2x/week  PT DURATION: 6 weeks  PLANNED INTERVENTIONS: 97110-Therapeutic exercises, 97530- Therapeutic activity, 97112- Neuromuscular re-education, 97535- Self Care, [id number]- Manual therapy, [phone]- Gait training, [phone]- Aquatic Therapy, Patient/Family education, Balance training, and Stair training  PLAN FOR NEXT SESSION: HEP review and update, manual techniques as appropriate, aerobic tasks, ROM and flexibility activities, strengthening and PREs, TPDN, gait and balance training as needed     Graydon Dingwall, PT, ATRIC Certified Exercise Expert for the Aging Adult  05/25/24 1:24 PM Phone: [phone] Fax: [phone]   05/25/2024, 1:24 PM

## 2024-05-25 ENCOUNTER — Encounter: Payer: Self-pay | Admitting: Physical Therapy

## 2024-05-25 ENCOUNTER — Ambulatory Visit: Admitting: Physical Therapy

## 2024-05-25 DIAGNOSIS — R2681 Unsteadiness on feet: Secondary | ICD-10-CM | POA: Diagnosis not present

## 2024-05-25 DIAGNOSIS — M6281 Muscle weakness (generalized): Secondary | ICD-10-CM

## 2024-05-25 DIAGNOSIS — R2689 Other abnormalities of gait and mobility: Secondary | ICD-10-CM | POA: Diagnosis not present

## 2024-05-26 ENCOUNTER — Telehealth: Payer: Self-pay

## 2024-05-26 NOTE — Telephone Encounter (Signed)
 LM message for patient  to return call about her appt. W/ Dr.Hunsucker - Ct was never schedule

## 2024-05-29 NOTE — Therapy (Unsigned)
 OUTPATIENT PHYSICAL THERAPY TREATMENT NOTE   Patient Name: Veronica Barrett MRN: [id number] DOB:07-Dec-1950, 73 y.o., female Today's Date: 05/30/2024  END OF SESSION:  PT End of Session - 05/30/24 1312     Visit Number 9    Number of Visits 12    Date for PT Re-Evaluation 06/20/24    Authorization Type MCR    Progress Note Due on Visit 10    PT Start Time 1315    PT Stop Time 1355    PT Time Calculation (min) 40 min    Activity Tolerance Patient tolerated treatment well;Patient limited by pain    Behavior During Therapy [facility] for tasks assessed/performed                Past Medical History:  Diagnosis Date   Allergy    seasonal allergy   Arthritis    left hip   Asthma    Depression    HSV infection    vag   Past Surgical History:  Procedure Laterality Date   APPENDECTOMY  11/17/1982   EYE SURGERY Left    left vitreous   LEG SURGERY Left 01/10/2024   TOTAL HIP ARTHROPLASTY Left 10/17/2014   Procedure: LEFT TOTAL HIP ARTHROPLASTY ANTERIOR APPROACH;  Surgeon: Donnice JONETTA Car, MD;  Location: [facility];  Service: Orthopedics;  Laterality: Left;   Patient Active Problem List   Diagnosis Date Noted   Osteoporosis without current pathological fracture 10/12/2023   Moderate persistent asthma 08/10/2023   Bronchitis 06/18/2021   Leukocytosis 06/17/2021   Environmental and seasonal allergies 10/07/2018   Chronic sinusitis 10/07/2018   H/O sinus surgery 10/07/2018   Hyperlipidemia 10/07/2018   S/P left THA, AA 10/17/2014   Recurrent HSV (herpes simplex virus) 04/21/2013   Depression, recurrent (HCC) 02/20/2010   Allergic rhinitis 04/16/2007    PCP: Wallace Joesph LABOR, PA   REFERRING PROVIDER: Chandra Toribio POUR, MD  REFERRING DIAG: [phone] (ICD-10-CM) - Closed fracture of left femur with routine healing, unspecified fracture morphology, unspecified portion of femur, subsequent encounter  THERAPY DIAG:  Muscle weakness (generalized)  Other abnormalities of gait and  mobility  Unsteadiness on feet  Rationale for Evaluation and Treatment: Rehabilitation  ONSET DATE: 01/10/24 DOS  SUBJECTIVE:   SUBJECTIVE STATEMENT: pain levels 1-2/10.  Main limitation is unsteadiness during walking but usure    PERTINENT HISTORY: HPI: Akeelah Seppala 73 y.o. female with past medical history of moderate persistent asthma, hyperlipidemia and depression who presented to the emergency department after a mechanical fall. Patient's fall led to direct strike of her left knee to the ground. Patient noted severe pain and inability to ambulate at that time. Imaging revealed a left distal femoral shaft fracture   Patient was hospitalized for evaluation and management of left distal 3rd femur shaft fracture. Orthopedic surgery evaluated in consultation and she underwent left femur ORIF on 01/10/2024. She has been evaluated by PT/OT postoperatively with recommendations for acute rehab. Pt is medically stable and has been evaluated and found to be appropriate for [facility] and admitted.  PAIN:  Are you having pain? Yes: NPRS scale: 3/10 Pain location: L thigh Pain description: ache Aggravating factors: stairs, prolonged activity Relieving factors: rest   PRECAUTIONS: Fall  RED FLAGS: None   WEIGHT BEARING RESTRICTIONS: No  FALLS:  Has patient fallen in last 6 months? No  OCCUPATION: retired  PLOF: Independent with household mobility with device  PATIENT GOALS: To manage my symptoms and wean of the cane  NEXT MD VISIT: 05/05/23  OBJECTIVE:  Note: Objective measures were completed at Evaluation unless otherwise noted.  DIAGNOSTIC FINDINGS: none available  PATIENT SURVEYS:  LEFS   MUSCLE LENGTH: Hamstrings: WFL Thomas test: No restriction  POSTURE: No Significant postural limitations  PALPATION: negative  LOWER EXTREMITY ROM:  Active ROM Right eval Left eval  Hip flexion    Hip extension    Hip abduction    Hip adduction    Hip internal rotation    Hip  external rotation    Knee flexion 125d 118d  Knee extension 0d 0d  Ankle dorsiflexion    Ankle plantarflexion    Ankle inversion    Ankle eversion     (Blank rows = not tested)  LOWER EXTREMITY MMT:  MMT Right eval Left eval  Hip flexion    Hip extension    Hip abduction  4-  Hip adduction    Hip internal rotation    Hip external rotation    Knee flexion  4-  Knee extension  4-  Ankle dorsiflexion    Ankle plantarflexion    Ankle inversion    Ankle eversion     (Blank rows = not tested)  LOWER EXTREMITY SPECIAL TESTS:  deferred  FUNCTIONAL TESTS:  30 seconds chair stand test 9 reps w/o Ues   GAIT: Distance walked: 67ftx2 Assistive device utilized: Single point cane Level of assistance: Complete Independence Comments: unremarkable                                                                                                                                TREATMENT:  [facility] Adult PT Treatment:                                                DATE: 05/30/24 Therapeutic Exercise: Nustep L6 8 min Neuromuscular re-ed: Runners step 4 in 15/15 5# KB Sidestepping against yellow band at counter 2 trips STS from airex pad arms crossed, no bracing observed. Therapeutic Activity: Supine hip fallouts BluTB 10x B, 10/10 unilaterally Bridge against BluTB 10x B clams S/L 10/10 BluTB SL bridge 10/10  [facility] Adult PT Treatment:                                                DATE: 05-25-24 Pt enters building ambulating independently with SPC. Treatment took place in water 3.8 to  4 ft 8 in. deep depending upon activity.  Pt entered and exited the pool via stair and handrails independently. Temperature 92 degrees. Initial pain 2/10 in left hip.   Aquatic Exercise: Initially ambulating forward , backward and side stepping to acclimate to water using yellow DB submerged in water.  Worked on forward  progression and decreased side to side waddling. Standing with UE support edge of pool  and using aquatic cuffs Aqua stretch to left lateral thigh and left buttock over left SI  with decreased muscle tension Hip Extension Stomps 3 way hip stretch with yellow noodle for IT band and adductor and hamstring Hip hinge to submerged bench with even wt bearing on bil LE with VC Hip abd/add  Hip ext/flex with knee straight  L back stretch Hamstring curl BIL Squats 2x15 Heel raises x20 Runners stretch bottom step x30 BIL Hamstring stretch bottom step x30 BIL Up and down steps with alternating steps and VC not to take steps one at time.   Pt requires the buoyancy of water for active assisted exercises with buoyancy supported for strengthening and AROM exercises. Hydrostatic pressure also supports joints by unweighting joint load by at least 50 % in 3-4 feet depth water. 80% in chest to neck deep water. Water will provide assistance with movement using the current and laminar flow while the buoyancy reduces weight bearing. Pt requires the viscosity of the water for resistance with strengthening exercises.    [facility] Adult PT Treatment:                                                DATE: 05/23/24 Therapeutic Exercise: Nustep L5 8 min Neuromuscular re-ed: Supine hip fallouts GTB 15x B, 15/15 unilaterally Bridge against GTB 15x L clams in R S/L 15x GTB P-ball curl ups 15x B, 15/15 unilaterally STS from airex pad 10x  Therapeutic Activity: Seated L hamstring stretch 30s x2 Supine L hip flexor stretch 30s x2 L QL stretch 30s x2 FAQs with ball squeeze 15x   [facility] Adult PT Treatment:                                                DATE: 05-18-24 Pt enters building ambulating independently with SPC. Treatment took place in water 3.8 to  4 ft 8 in. deep depending upon activity.  Pt entered and exited the pool via stair and handrails independently. Temperature 90 degrees Patient entered water for aquatic therapy for first time and was introduced to principles and therapeutic effects of water  as she ambulated and acclimated to pool.    Aquatic Exercise: Initially ambulating forward , backward and side stepping to acclimate to water Standing with UE support edge of pool: Aqua stretch to left lateral thigh with decreased muscle tension Hip hinge to submerged bench with even wt bearing on bil LE with VC Hip abd/add  Hip ext/flex with knee straight  L back stretch Hamstring curl BIL Squats 2x15 Heel raises x20 Runners stretch bottom step x30 BIL Hamstring stretch bottom step x30 BIL Hip Extension Stomps 3 way hip stretch with yellow noodle for IT band and adductor and hamstring Sitting on submerged bench Bicycle kicks x1' Reverse Bicycle kicks 1 ' Flutter kicks x 1 '  Scissor kicks x 1'  Pt requires the buoyancy of water for active assisted exercises with buoyancy supported for strengthening and AROM exercises. Hydrostatic pressure also supports joints by unweighting joint load by at least 50 % in 3-4 feet depth water. 80% in chest to neck deep water. Water will provide assistance  with movement using the current and laminar flow while the buoyancy reduces weight bearing. Pt requires the viscosity of the water for resistance with strengthening exercises.  [facility] Adult PT Treatment:                                                DATE: 05/16/34 Therapeutic Exercise: Nustep L4 6 min Neuromuscular re-ed: Supine hip fallouts GTB 15x B, 15/15 unilaterally Bridge against GTB 15x L clams in R S/L 15x GTB P-ball curl ups 15x B, 15/15 unilaterally Therapeutic Activity: 2 MWT 370ft w/o cane STS from airex 10x Runners step 4 in 10/10 needing UE Support  [facility] Adult PT Treatment:                                                DATE: 05/13/24 Therapeutic Exercise: Nustep L3 8 min Neuromuscular re-ed: Supine hip fallouts GTB 15x B, 15/15 unilaterally Bridge against GTB 15x L clams in R S/L 15x GTB P-ball curl ups 10x B, 10/10 unilaterally Therapeutic Activity: Seated L hamstring  stretch 30s x2 Supine L hip flexor stretch 30s x2 L QL stretch 30s x2  [facility] Adult PT Treatment:                                                DATE: 05/03/2024  Therapeutic Exercise: NuStep  8'  Seated L hamstring stretch w/APT and hip ER 2x1' Therapeutic Activity: Supine hip fall out 2x12 B, hold 2s, GTB Supine bridge w/GTB 2x12, hold 4s Standing hip abduction 2x12, hold 2s Split stance LLE TKE 1x12, hold 3s   [facility] Adult PT Treatment:                                                DATE: 04/28/24 Therapeutic Exercise: Nustep L2 8 min Seated L hamstring stretch 30s x2 Supine L hip flexor stretch 30s x2 Neuromuscular re-ed: Supine hip fallouts RTB 15x B, 15/15 unilaterally Bridge against RTB S/L clams RTB 15/15 Therapeutic Activity: STS from airex pad PPT 3s 15x PPT with LE march 10x  Bridge with ball 15x   PATIENT EDUCATION:  Education details: Discussed eval findings, rehab rationale and POC and patient is in agreement  Person educated: Patient Education method: Explanation and Handouts Education comprehension: verbalized understanding and needs further education  HOME EXERCISE PROGRAM: Access Code: [email or web address] URL: [email or web address] Date: 04/20/2024 Prepared by: Reyes Kohut  Exercises - Supine Quad Set  - 2-3 x daily - 5 x weekly - 1 sets - 10 reps - 3s hold - Small Range Straight Leg Raise  - 2-3 x daily - 5 x weekly - 1 sets - 10 reps - Clamshell with Resistance  - 2-3 x daily - 5 x weekly - 1 sets - 10 reps  ASSESSMENT:  CLINICAL IMPRESSION: Advanced CKC tasks to incorporate more functional strengthening as well as balance/proprioceptive component.  Observation of functional tasks reveals continued L hip abduction weakness, most prominent in SLS  tasks and stance phase of gait.  Required UE support with runners step activity when leading LLE.  Plan to re-assess at next land based session    Patient is a 73 y.o. female who was seen today  for physical therapy evaluation and treatment for L LE weakness and dysfunction following fracture and ORIF 01/10/24.  Strength deficits note with 30s chair stand test, no leg length identified, extensor lag observed with SLR, L abduction strength limited.  Patient is a good candidate for OPPT to resolve strength, balance and ROM deficits and wean off cane when appropriate  OBJECTIVE IMPAIRMENTS: Abnormal gait, decreased activity tolerance, decreased balance, decreased endurance, decreased mobility, difficulty walking, decreased ROM, decreased strength, and pain.   ACTIVITY LIMITATIONS: carrying, lifting, standing, squatting, and stairs  PERSONAL FACTORS: Age, Past/current experiences, and Time since onset of injury/illness/exacerbation are also affecting patient's functional outcome.   REHAB POTENTIAL: Good  CLINICAL DECISION MAKING: Stable/uncomplicated  EVALUATION COMPLEXITY: Low   GOALS: Goals reviewed with patient? No  SHORT TERM GOALS: Target date: 05/11/2024   Patient to demonstrate independence in HEP  Baseline: [email or web address] Goal status: Met  2.  Assess 2 MWT Baseline: TBD; 05/16/24 355ft w/o cane Goal status: Met   LONG TERM GOALS: Target date: 06/01/2024    Patient will increase 30s chair stand reps from 9 to 12 without arms to demonstrate and improved functional ability with less pain/difficulty as well as reduce fall risk.  Baseline: 9 Goal status: INITIAL  2.  Patient will acknowledge 2/10 pain at least once during episode of care   Baseline: 4/10 Goal status: INITIAL  3.  Patient will score at least 60/80 on LEFS to signify clinically meaningful improvement in functional abilities.   Baseline: 36/80 Goal status: INITIAL  4.  Increase L hip and knee strength to 4/5 in deficit regions Baseline: 4-/5 knee and hip abduction strength Goal status: INITIAL  5.  125d L knee flexion Baseline: 118d L knee flexion Goal status: INITIAL  6.  Patient to negotiate 16 steps  with most appropriate pattern Baseline: TBD Goal status: INITIAL   PLAN:  PT FREQUENCY: 2x/week  PT DURATION: 6 weeks  PLANNED INTERVENTIONS: 97110-Therapeutic exercises, 97530- Therapeutic activity, 97112- Neuromuscular re-education, 97535- Self Care, [id number]- Manual therapy, [phone]- Gait training, [phone]- Aquatic Therapy, Patient/Family education, Balance training, and Stair training  PLAN FOR NEXT SESSION: HEP review and update, manual techniques as appropriate, aerobic tasks, ROM and flexibility activities, strengthening and PREs, TPDN, gait and balance training as needed     Graydon Dingwall, PT, ATRIC Certified Exercise Expert for the Aging Adult  05/30/24 1:59 PM Phone: [phone] Fax: [phone]   05/30/2024, 1:59 PM

## 2024-05-30 ENCOUNTER — Ambulatory Visit (INDEPENDENT_AMBULATORY_CARE_PROVIDER_SITE_OTHER): Admitting: Pulmonary Disease

## 2024-05-30 ENCOUNTER — Ambulatory Visit

## 2024-05-30 ENCOUNTER — Encounter: Payer: Self-pay | Admitting: Pulmonary Disease

## 2024-05-30 VITALS — BP 126/70 | HR 55 | Temp 98.2°F | Ht 64.0 in | Wt 168.8 lb

## 2024-05-30 DIAGNOSIS — M6281 Muscle weakness (generalized): Secondary | ICD-10-CM

## 2024-05-30 DIAGNOSIS — Z87891 Personal history of nicotine dependence: Secondary | ICD-10-CM

## 2024-05-30 DIAGNOSIS — J4540 Moderate persistent asthma, uncomplicated: Secondary | ICD-10-CM

## 2024-05-30 DIAGNOSIS — R2681 Unsteadiness on feet: Secondary | ICD-10-CM | POA: Diagnosis not present

## 2024-05-30 DIAGNOSIS — R2689 Other abnormalities of gait and mobility: Secondary | ICD-10-CM | POA: Diagnosis not present

## 2024-05-30 MED ORDER — DULERA 200-5 MCG/ACT IN AERO: 2.0000 | INHALATION_SPRAY | Freq: Every day | RESPIRATORY_TRACT | 11 refills | Status: DC

## 2024-05-30 NOTE — Patient Instructions (Addendum)
 Nice to see you again  I refilled Dulera , continue using once daily, if you find symptoms worsen or increase it to twice daily.  Let me know if you need to change the prescription in the future.  Return to clinic in 1 year or sooner if needed with Dr. Annella

## 2024-05-30 NOTE — Progress Notes (Signed)
 @Patient  ID: Apolinar LITTIE Kocher, female    DOB: [birth year], 73 y.o.   MRN: [id number]  Chief Complaint  Patient presents with   Follow-up    Pt states she is doing fine Has not had to use her rescue inhaler since her last visit.     Referring provider: Wallace Joesph LABOR, PA  HPI:   73 y.o. woman whom we are seeing in follow up for evaluation of dyspnea on exertion felt to be related to asthma.  Most recent PCP note reviewed.  Discharge summary after leg fracture 01/2024 reviewed.  Doing well.  No exacerbation of the interim.  Dulera  is working well.  No issues or complaints.  Unfortunately had mechanical fall 01/2024 while out of town.  Broke her left femur it seems.  This been surgically repaired.  She is recovering well.  Slowly build up strength.  Daughter recently moved to [place].  She is hopeful she can visit her in the future once stronger.  HPI at initial visit: Patient hospitalized earlier this month with wheezing, bronchitis.  She may treat with outpatient courses of prednisone  as well as antibiotics.  Despite this her symptoms of not significant proved.  Mildly worsened.  There is mention of hypoxemia although I do not see any documented true hypoxemia, saturations low 90s on room air in the ED.  She was placed on oxygen supplementation during hospitalization.  She was put on higher dose steroids in the hospital and gradually her breathing and wheezing improved.  She was discharged on steroid taper, Dulera  mid dose, Advair mid dose, DuoNebs although she has not used them, and rescue inhaler.  She feels her rescue inhaler use is decreased with time.  Overall her breathing has improved with time.  She took her last dose of prednisone  today.  She continues have dyspnea on exertion although again improving.  Cough is improving as well.  Productive of gray sputum.  No time during day when things are better or worse.  No relieving exacerbating factors other albuterol  seems to help  some.  Reports being diagnosed with a touch of asthma about 10 years ago.  Endorses seasonal allergies, runny nose, itchy eyes.  Uses Zyrtec daily.  Asthma runs in the family.  Reviewed at length serial chest imaging on my interpretation 06/09/2021 demonstrates small retrocardiac opacity with what appears to be correlate in the base of the left lung along the posterior spine.  Subsequent chest x-ray about a week later 06/2021 with improvement in both retrocardiac opacity and correlate on the lateral film.  Reviewed rib x-ray 06/21/2021 that did not demonstrate rib fracture on review of results.  PMH: Seasonal allergies, asthma Surgical history: Appendectomy, hip replacement Family history: Thyroid  cancer in sister, father had other form of cancer, no respiratory illness in first relatives that she can recall Social history: She is a never smoker, lives in [place], near [facility], retired   Public house manager / Pulmonary Flowsheets:   ACT:  Asthma Control Test ACT Total Score  12/07/2023  2:11 PM 25  12/26/2021  9:56 AM 24  09/25/2021 10:02 AM 23    MMRC:     No data to display          Epworth:      No data to display          Tests:   FENO:  No results found for: NITRICOXIDE  PFT:     No data to display  WALK:      No data to display          Imaging: Personally reviewed as per EMR discussion in this note No results found.  Lab Results: Personally reviewed, notably eosinophils no higher than 200 in the past CBC    Component Value Date/Time   WBC 6.8 08/10/2023 1032   WBC 8.5 06/20/2021 0430   RBC 4.14 08/10/2023 1032   RBC 4.14 06/20/2021 0430   HGB 12.9 08/10/2023 1032   HCT 39.3 08/10/2023 1032   PLT 366 08/10/2023 1032   MCV 95 08/10/2023 1032   MCH 31.2 08/10/2023 1032   MCH 30.7 06/20/2021 0430   MCHC 32.8 08/10/2023 1032   MCHC 32.1 06/20/2021 0430   RDW 11.8 08/10/2023 1032   LYMPHSABS 1.6 08/10/2023 1032   MONOABS 0.4  06/20/2021 0430   EOSABS 1.2 (H) 08/10/2023 1032   BASOSABS 0.0 08/10/2023 1032    BMET    Component Value Date/Time   NA 142 08/10/2023 1032   K 4.2 08/10/2023 1032   CL 102 08/10/2023 1032   CO2 22 08/10/2023 1032   GLUCOSE 86 08/10/2023 1032   GLUCOSE 104 (H) 06/19/2021 0408   BUN 14 08/10/2023 1032   CREATININE 0.67 08/10/2023 1032   CALCIUM  9.5 08/10/2023 1032   GFRNONAA >60 06/19/2021 0408   GFRAA 103 02/14/2020 1000    BNP    Component Value Date/Time   BNP 37.4 06/20/2021 0430    ProBNP No results found for: PROBNP  Specialty Problems       Pulmonary Problems   Allergic rhinitis   Qualifier: Diagnosis of  By: Donnajean RN, Jon Morrison       Chronic sinusitis   Bronchitis   Moderate persistent asthma    Allergies  Allergen Reactions   Seasonal Ic [Cholestatin]    Tape Rash    Immunization History  Administered Date(s) Administered   Influenza Whole 08/22/2009, 09/19/2010   Influenza, High Dose Seasonal PF 08/31/2018, 09/19/2019, 09/10/2022   Influenza,inj,Quad PF,6+ Mos 10/02/2014   Influenza-Unspecified 08/17/2014   PFIZER(Purple Top)SARS-COV-2 Vaccination 12/25/2019, 01/19/2020   Pfizer(Comirnaty)Fall Seasonal Vaccine 12 years and older 11/21/2023   Pneumococcal Conjugate-13 08/31/2018   Td 10/27/2000   Tdap 04/21/2013, 02/12/2023    Past Medical History:  Diagnosis Date   Allergy    seasonal allergy   Arthritis    left hip   Asthma    Depression    HSV infection    vag    Tobacco History: Social History   Tobacco Use  Smoking Status Never   Passive exposure: Never  Smokeless Tobacco Never   Counseling given: Not Answered    Continue to not smoke  Outpatient Encounter Medications as of 05/30/2024  Medication Sig   acyclovir  (ZOVIRAX ) 200 MG capsule TAKE 1 CAPSULE BY MOUTH 5 TIMES DAILY AS NEEDED FOR BREAKOUTS   albuterol  (VENTOLIN  HFA) 108 (90 Base) MCG/ACT inhaler Inhale 2 puffs into the lungs every 6 (six) hours  as needed for wheezing or shortness of breath.   ASPIRIN  LOW DOSE 81 MG tablet SMARTSIG:1 Tablet(s) By Mouth Morning-Night   atorvastatin  (LIPITOR) 20 MG tablet Take 1 tablet by mouth once daily   cetirizine (ZYRTEC) 10 MG tablet Take 10 mg by mouth daily.   citalopram  (CELEXA ) 20 MG tablet TAKE 1 & 1/2 (ONE & ONE-HALF) TABLETS BY MOUTH ONCE DAILY   ipratropium-albuterol  (DUONEB) 0.5-2.5 (3) MG/3ML SOLN Take 3 mLs by nebulization every 6 (six) hours as needed (shortness of  breath, cough or wheeze).   montelukast  (SINGULAIR ) 10 MG tablet Take 1 tablet (10 mg total) by mouth at bedtime.   Nebulizer System All-In-One MISC Use with nebulized albuterol  once every 4-6 hours for wheezing and shortness of breath.   pantoprazole  (PROTONIX ) 40 MG tablet Take 1 tablet (40 mg total) by mouth daily.   Respiratory Therapy Supplies (NEBULIZER MASK ADULT) MISC 1 Inhaler by Does not apply route every 4 (four) hours as needed.   [DISCONTINUED] mometasone -formoterol  (DULERA ) 200-5 MCG/ACT AERO Inhale 2 puffs into the lungs 2 (two) times daily.   mometasone -formoterol  (DULERA ) 200-5 MCG/ACT AERO Inhale 2 puffs into the lungs daily.   No facility-administered encounter medications on file as of 05/30/2024.     Review of Systems  Review of Systems  N/a Physical Exam  BP 126/70 (BP Location: Left Arm, Patient Position: Sitting, Cuff Size: Normal)   Pulse (!) 55   Temp 98.2 F (36.8 C) (Oral)   Ht 5' 4 (1.626 m)   Wt 168 lb 12.8 oz (76.6 kg)   SpO2 99%   BMI 28.97 kg/m   Wt Readings from Last 5 Encounters:  05/30/24 168 lb 12.8 oz (76.6 kg)  12/07/23 163 lb 3.2 oz (74 kg)  10/12/23 163 lb (73.9 kg)  08/10/23 169 lb (76.7 kg)  07/13/23 164 lb 9.6 oz (74.7 kg)    BMI Readings from Last 5 Encounters:  05/30/24 28.97 kg/m  12/07/23 27.16 kg/m  10/12/23 27.49 kg/m  08/10/23 28.12 kg/m  07/13/23 27.39 kg/m     Physical Exam General: Well-appearing, no acute distress Eyes: EOMI, no  icterus Neck: Supple, no JVP Cardiovascular: Regular rate and rhythm, no murmurs Pulmonary: Clear, normal breathing on room air   Assessment & Plan:   Dyspnea on exertion:  Briefly hospitalized for severe bronchitis.  Possible small pneumonia on chest x-ray on my interpretation although not called by radiology.  Suspect triggered quiescent asthma given ongoing symptoms of wheeze on exam.  She has atopic symptoms.  Dyspnea essentially resolved, exercising regularly, De-escalated high-dose Advair HFA to mid dose diskus 11/22.  Then became again dysregulated late summer, early fall 2024.  Improved with multiple rounds of antibiotics and prednisone .  Durable improvement in symptoms with ongoing Dulera  use, currently daily.  Dulera  refilled today.  Asthma, moderate persistent: Atopic symptoms, wheezing, prolonged bronchitis responsive to steroids including hospitalization 06/2021.  Dyspnea improved with high-dose Advair HFA.  De-escalated to mid dose Advair diskus.  Symptoms were well-controlled and ran out of inhaler over the last several weeks prior to decompensation fall 2024.  Currently on Dulera .  Stable for now.  Refilled today.   Return in about 1 year (around 05/30/2025) for f/u Dr. Annella.   Donnice JONELLE Annella, MD 05/30/2024

## 2024-05-31 NOTE — Therapy (Incomplete)
 OUTPATIENT PHYSICAL THERAPY TREATMENT NOTE Progress Note Reporting Period 04-20-24 to 06-01-24  See note below for Objective Data and Assessment of Progress/Goals.       Patient Name: Veronica Barrett MRN: [id number] DOB:January 03, 1951, 73 y.o., female Today's Date: 06/01/2024  END OF SESSION:  PT End of Session - 06/01/24 1226     Visit Number 10    Number of Visits 12    Date for PT Re-Evaluation 06/20/24    Authorization Type MCR    Progress Note Due on Visit 10    PT Start Time 1230    PT Stop Time 1317    PT Time Calculation (min) 47 min    Activity Tolerance Patient tolerated treatment well;Patient limited by pain    Behavior During Therapy [facility] for tasks assessed/performed                 Past Medical History:  Diagnosis Date   Allergy    seasonal allergy   Arthritis    left hip   Asthma    Depression    HSV infection    vag   Past Surgical History:  Procedure Laterality Date   APPENDECTOMY  11/17/1982   EYE SURGERY Left    left vitreous   LEG SURGERY Left 01/10/2024   TOTAL HIP ARTHROPLASTY Left 10/17/2014   Procedure: LEFT TOTAL HIP ARTHROPLASTY ANTERIOR APPROACH;  Surgeon: Veronica JONETTA Car, MD;  Location: [facility];  Service: Orthopedics;  Laterality: Left;   Patient Active Problem List   Diagnosis Date Noted   Osteoporosis without current pathological fracture 10/12/2023   Moderate persistent asthma 08/10/2023   Bronchitis 06/18/2021   Leukocytosis 06/17/2021   Environmental and seasonal allergies 10/07/2018   Chronic sinusitis 10/07/2018   H/O sinus surgery 10/07/2018   Hyperlipidemia 10/07/2018   S/P left THA, AA 10/17/2014   Recurrent HSV (herpes simplex virus) 04/21/2013   Depression, recurrent (HCC) 02/20/2010   Allergic rhinitis 04/16/2007    PCP: Veronica Barrett LABOR, PA   REFERRING PROVIDER: Chandra Toribio POUR, MD  REFERRING DIAG: [phone] (ICD-10-CM) - Closed fracture of left femur with routine healing, unspecified fracture morphology,  unspecified portion of femur, subsequent encounter  THERAPY DIAG:  Muscle weakness (generalized)  Other abnormalities of gait and mobility  Unsteadiness on feet  Rationale for Evaluation and Treatment: Rehabilitation  ONSET DATE: 01/10/24 DOS  SUBJECTIVE:   SUBJECTIVE STATEMENT: 06-01-24  Pt states she is very sore after clinic exercise about 2/10 and 3/10 in leg area.   pain levels 1-2/10.     PERTINENT HISTORY: HPI: Veronica Barrett 73 y.o. female with past medical history of moderate persistent asthma, hyperlipidemia and depression who presented to the emergency department after a mechanical fall. Patient's fall led to direct strike of her left knee to the ground. Patient noted severe pain and inability to ambulate at that time. Imaging revealed a left distal femoral shaft fracture   Patient was hospitalized for evaluation and management of left distal 3rd femur shaft fracture. Orthopedic surgery evaluated in consultation and she underwent left femur ORIF on 01/10/2024. She has been evaluated by PT/OT postoperatively with recommendations for acute rehab. Pt is medically stable and has been evaluated and found to be appropriate for [facility] and admitted.  PAIN:  Are you having pain? Yes: NPRS scale: 3/10 Pain location: L thigh Pain description: ache Aggravating factors: stairs, prolonged activity Relieving factors: rest   PRECAUTIONS: Fall  RED FLAGS: None   WEIGHT BEARING RESTRICTIONS: No  FALLS:  Has  patient fallen in last 6 months? No  OCCUPATION: retired  PLOF: Independent with household mobility with device  PATIENT GOALS: To manage my symptoms and wean of the cane  NEXT MD VISIT: 05/05/23  OBJECTIVE:  Note: Objective measures were completed at Evaluation unless otherwise noted.  DIAGNOSTIC FINDINGS: none available  PATIENT SURVEYS:  LEFS  06-01-24 LEFS 45/80 56.3% MUSCLE LENGTH: Hamstrings: WFL Thomas test: No restriction  POSTURE: No Significant postural  limitations  PALPATION: negative  LOWER EXTREMITY ROM:  Active ROM Right eval Left eval  Hip flexion    Hip extension    Hip abduction    Hip adduction    Hip internal rotation    Hip external rotation    Knee flexion 125d 118d  Knee extension 0d 0d  Ankle dorsiflexion    Ankle plantarflexion    Ankle inversion    Ankle eversion     (Blank rows = not tested)  LOWER EXTREMITY MMT:  MMT Right eval Left eval  Hip flexion    Hip extension    Hip abduction  4-  Hip adduction    Hip internal rotation    Hip external rotation    Knee flexion  4-  Knee extension  4-  Ankle dorsiflexion    Ankle plantarflexion    Ankle inversion    Ankle eversion     (Blank rows = not tested)  LOWER EXTREMITY SPECIAL TESTS:  deferred  FUNCTIONAL TESTS:  30 seconds chair stand test 9 reps w/o Ues   GAIT: Distance walked: 68ftx2 Assistive device utilized: Single point cane Level of assistance: Complete Independence Comments: unremarkable                                                                                                                                TREATMENT:  [facility] Adult PT Treatment:                                                DATE: 06-01-24 Pt enters building ambulating independently with SPC. Treatment took place in water 3.8 to  4 ft 8 in. deep depending upon activity.  Pt entered and exited the pool via stair and handrails independently. Temperature 92 degrees. Initial pain 3/10 in left hip. 2/10 over all soreness LEFS  45/80   Aquatic HEP  Access Code: [email or web address]  in process   Aquatic Exercise: Standing with UE support edge of pool and using aquatic cuffs Standing Balance on Noodle at [employer] High Knee Balance with Forward Walking and Hand Floats   High Knee Balance with Backward Walking with Hand Floats   Hip Extension Stomps 3 way hip stretch with yellow noodle for IT band and adductor and hamstring Hip hinge to submerged bench with even  wt bearing on bil LE  with VC Hip abd/add  Hip ext/flex with knee straight  L back stretch Hamstring curl BIL Squats 2x15 Heel raises x20 Runners stretch bottom step x30 BIL Hamstring stretch bottom step x30 BIL Sit to Stand on 3rd submerged step with UE support   Pt requires the buoyancy of water for active assisted exercises with buoyancy supported for strengthening and AROM exercises. Hydrostatic pressure also supports joints by unweighting joint load by at least 50 % in 3-4 feet depth water. 80% in chest to neck deep water. Water will provide assistance with movement using the current and laminar flow while the buoyancy reduces weight bearing. Pt requires the viscosity of the water for resistance with strengthening exercises.   [facility] Adult PT Treatment:                                                DATE: 05/30/24 Therapeutic Exercise: Nustep L6 8 min Neuromuscular re-ed: Runners step 4 in 15/15 5# KB Sidestepping against yellow band at counter 2 trips STS from airex pad arms crossed, no bracing observed. Therapeutic Activity: Supine hip fallouts BluTB 10x B, 10/10 unilaterally Bridge against BluTB 10x B clams S/L 10/10 BluTB SL bridge 10/10  [facility] Adult PT Treatment:                                                DATE: 05-25-24 Pt enters building ambulating independently with SPC. Treatment took place in water 3.8 to  4 ft 8 in. deep depending upon activity.  Pt entered and exited the pool via stair and handrails independently. Temperature 92 degrees. Initial pain 2/10 in left hip.   Aquatic Exercise: Initially ambulating forward , backward and side stepping to acclimate to water using yellow DB submerged in water.  Worked on forward progression and decreased side to side waddling. Standing with UE support edge of pool and using aquatic cuffs Aqua stretch to left lateral thigh and left buttock over left SI  with decreased muscle tension Hip Extension Stomps 3 way hip stretch with  yellow noodle for IT band and adductor and hamstring Hip hinge to submerged bench with even wt bearing on bil LE with VC Hip abd/add  Hip ext/flex with knee straight  L back stretch Hamstring curl BIL Squats 2x15 Heel raises x20 Runners stretch bottom step x30 BIL Hamstring stretch bottom step x30 BIL Up and down steps with alternating steps and VC not to take steps one at time.   Pt requires the buoyancy of water for active assisted exercises with buoyancy supported for strengthening and AROM exercises. Hydrostatic pressure also supports joints by unweighting joint load by at least 50 % in 3-4 feet depth water. 80% in chest to neck deep water. Water will provide assistance with movement using the current and laminar flow while the buoyancy reduces weight bearing. Pt requires the viscosity of the water for resistance with strengthening exercises.    [facility] Adult PT Treatment:                                                DATE: 05/23/24  Therapeutic Exercise: Nustep L5 8 min Neuromuscular re-ed: Supine hip fallouts GTB 15x B, 15/15 unilaterally Bridge against GTB 15x L clams in R S/L 15x GTB P-ball curl ups 15x B, 15/15 unilaterally STS from airex pad 10x  Therapeutic Activity: Seated L hamstring stretch 30s x2 Supine L hip flexor stretch 30s x2 L QL stretch 30s x2 FAQs with ball squeeze 15x   [facility] Adult PT Treatment:                                                DATE: 05-18-24 Pt enters building ambulating independently with SPC. Treatment took place in water 3.8 to  4 ft 8 in. deep depending upon activity.  Pt entered and exited the pool via stair and handrails independently. Temperature 90 degrees Patient entered water for aquatic therapy for first time and was introduced to principles and therapeutic effects of water as she ambulated and acclimated to pool.    Aquatic Exercise: Initially ambulating forward , backward and side stepping to acclimate to water Standing with UE  support edge of pool: Aqua stretch to left lateral thigh with decreased muscle tension Hip hinge to submerged bench with even wt bearing on bil LE with VC Hip abd/add  Hip ext/flex with knee straight  L back stretch Hamstring curl BIL Squats 2x15 Heel raises x20 Runners stretch bottom step x30 BIL Hamstring stretch bottom step x30 BIL Hip Extension Stomps 3 way hip stretch with yellow noodle for IT band and adductor and hamstring Sitting on submerged bench Bicycle kicks x1' Reverse Bicycle kicks 1 ' Flutter kicks x 1 '  Scissor kicks x 1'  Pt requires the buoyancy of water for active assisted exercises with buoyancy supported for strengthening and AROM exercises. Hydrostatic pressure also supports joints by unweighting joint load by at least 50 % in 3-4 feet depth water. 80% in chest to neck deep water. Water will provide assistance with movement using the current and laminar flow while the buoyancy reduces weight bearing. Pt requires the viscosity of the water for resistance with strengthening exercises.  [facility] Adult PT Treatment:                                                DATE: 05/16/34 Therapeutic Exercise: Nustep L4 6 min Neuromuscular re-ed: Supine hip fallouts GTB 15x B, 15/15 unilaterally Bridge against GTB 15x L clams in R S/L 15x GTB P-ball curl ups 15x B, 15/15 unilaterally Therapeutic Activity: 2 MWT 35ft w/o cane STS from airex 10x Runners step 4 in 10/10 needing UE Support  [facility] Adult PT Treatment:                                                DATE: 05/13/24 Therapeutic Exercise: Nustep L3 8 min Neuromuscular re-ed: Supine hip fallouts GTB 15x B, 15/15 unilaterally Bridge against GTB 15x L clams in R S/L 15x GTB P-ball curl ups 10x B, 10/10 unilaterally Therapeutic Activity: Seated L hamstring stretch 30s x2 Supine L hip flexor stretch 30s x2 L QL stretch 30s x2  [facility] Adult PT Treatment:  DATE:  05/03/2024  Therapeutic Exercise: NuStep  8'  Seated L hamstring stretch w/APT and hip ER 2x1' Therapeutic Activity: Supine hip fall out 2x12 B, hold 2s, GTB Supine bridge w/GTB 2x12, hold 4s Standing hip abduction 2x12, hold 2s Split stance LLE TKE 1x12, hold 3s   [facility] Adult PT Treatment:                                                DATE: 04/28/24 Therapeutic Exercise: Nustep L2 8 min Seated L hamstring stretch 30s x2 Supine L hip flexor stretch 30s x2 Neuromuscular re-ed: Supine hip fallouts RTB 15x B, 15/15 unilaterally Bridge against RTB S/L clams RTB 15/15 Therapeutic Activity: STS from airex pad PPT 3s 15x PPT with LE march 10x  Bridge with ball 15x   PATIENT EDUCATION:  Education details: Discussed eval findings, rehab rationale and POC and patient is in agreement  Person educated: Patient Education method: Explanation and Handouts Education comprehension: verbalized understanding and needs further education  HOME EXERCISE PROGRAM: Access Code: [email or web address] URL: [email or web address] Date: 04/20/2024 Prepared by: Reyes Kohut  Exercises - Supine Quad Set  - 2-3 x daily - 5 x weekly - 1 sets - 10 reps - 3s hold - Small Range Straight Leg Raise  - 2-3 x daily - 5 x weekly - 1 sets - 10 reps - Clamshell with Resistance  - 2-3 x daily - 5 x weekly - 1 sets - 10 reps Aquatic HEP Access Code: [email or web address]   ASSESSMENT:  CLINICAL IMPRESSION:  Progress note today.  Pt showing improvement and comments she is able to do more than she die at evaluation.  Session today focused on hip and core strengthening in the aquatic environment for use of buoyancy to offload joints and the viscosity of water as resistance during therapeutic exercise. Pt show consistently improved activity tolerance session to session. PT LEFS score 45/80 with improvement from eval.  30  STS 8 ( eval 9) with wt bear to Right and complaint of soreness impeding increased # of STS today  but pt is able to perform exercises with confidence in water.   Patient was able to tolerate all prescribed exercises in the aquatic environment with no adverse effects and reports 3/10 pain initially and decreased to 0/10 at end of session.  Aquatic HEP being formulated for distribution next visit. Pt continues to have more dependence on R LE with transition motion showing decreased balance/ proprioception and left gluteal  weakness.  Patient continues to benefit from skilled PT services on land and aquatic based and should be progressed as able to improve functional independence  .     EVAL- Patient is a 73 y.o. female who was seen today for physical therapy evaluation and treatment for L LE weakness and dysfunction following fracture and ORIF 01/10/24.  Strength deficits note with 30s chair stand test, no leg length identified, extensor lag observed with SLR, L abduction strength limited.  Patient is a good candidate for OPPT to resolve strength, balance and ROM deficits and wean off cane when appropriate  OBJECTIVE IMPAIRMENTS: Abnormal gait, decreased activity tolerance, decreased balance, decreased endurance, decreased mobility, difficulty walking, decreased ROM, decreased strength, and pain.   ACTIVITY LIMITATIONS: carrying, lifting, standing, squatting, and stairs  PERSONAL FACTORS: Age, Past/current experiences, and Time since onset of injury/illness/exacerbation are also affecting patient's functional outcome.  REHAB POTENTIAL: Good  CLINICAL DECISION MAKING: Stable/uncomplicated  EVALUATION COMPLEXITY: Low   GOALS: Goals reviewed with patient? No  SHORT TERM GOALS: Target date: 05/11/2024   Patient to demonstrate independence in HEP  Baseline: [email or web address] Goal status: Met  2.  Assess 2 MWT Baseline: TBD; 05/16/24 36ft w/o cane Goal status: Met   LONG TERM GOALS: Target date: 06/01/2024    Patient will increase 30s chair stand reps from 9 to 12 without arms to demonstrate and  improved functional ability with less pain/difficulty as well as reduce fall risk.  Baseline: 9 06-01-24  8  with wt bear to the Right Goal status: ONGOING  2.  Patient will acknowledge 2/10 pain at least once during episode of care   Baseline: 4/10 06-01-24  2/10 but improving Goal status:ONGOING  3.  Patient will score at least 60/80 on LEFS to signify clinically meaningful improvement in functional abilities.   Baseline: 36/80 06-01-24  45/60 Goal status: ONGOING  4.  Increase L hip and knee strength to 4/5 in deficit regions Baseline: 4-/5 knee and hip abduction strength Goal status: ONGOING  5.  125d L knee flexion Baseline: 118d L knee flexion Goal status: ONGOING  6.  Patient to negotiate 16 steps with most appropriate pattern Baseline: TBD Goal status: ONGOING   PLAN:  PT FREQUENCY: 2x/week  PT DURATION: 6 weeks  PLANNED INTERVENTIONS: 97110-Therapeutic exercises, 97530- Therapeutic activity, 97112- Neuromuscular re-education, 97535- Self Care, [id number]- Manual therapy, [phone]- Gait training, [phone]- Aquatic Therapy, Patient/Family education, Balance training, and Stair training  PLAN FOR NEXT SESSION: HEP review and update, manual techniques as appropriate, aerobic tasks, ROM and flexibility activities, strengthening and PREs, TPDN, gait and balance training as needed     Graydon Dingwall, PT, ATRIC Certified Exercise Expert for the Aging Adult  06/01/24 1:26 PM Phone: [phone] Fax: [phone]  06/01/2024, 12:27 PM

## 2024-06-01 ENCOUNTER — Ambulatory Visit: Admitting: Physical Therapy

## 2024-06-01 ENCOUNTER — Encounter: Payer: Self-pay | Admitting: Physical Therapy

## 2024-06-01 DIAGNOSIS — R2689 Other abnormalities of gait and mobility: Secondary | ICD-10-CM | POA: Diagnosis not present

## 2024-06-01 DIAGNOSIS — M6281 Muscle weakness (generalized): Secondary | ICD-10-CM

## 2024-06-01 DIAGNOSIS — R2681 Unsteadiness on feet: Secondary | ICD-10-CM

## 2024-06-03 NOTE — Therapy (Signed)
 OUTPATIENT PHYSICAL THERAPY TREATMENT NOTE Progress Note Reporting Period 04-20-24 to 06-01-24  See note below for Objective Data and Assessment of Progress/Goals.       Patient Name: Veronica Barrett MRN: [id number] DOB:[birth year], 73 y.o., female Today's Date: 06/06/2024  END OF SESSION:  PT End of Session - 06/06/24 1005     Visit Number 11    Number of Visits 20    Date for PT Re-Evaluation 06/20/24    Authorization Type MCR    Progress Note Due on Visit 10    PT Start Time 1005    PT Stop Time 1045    PT Time Calculation (min) 40 min    Activity Tolerance Patient tolerated treatment well;Patient limited by pain    Behavior During Therapy [facility] for tasks assessed/performed                  Past Medical History:  Diagnosis Date   Allergy    seasonal allergy   Arthritis    left hip   Asthma    Depression    HSV infection    vag   Past Surgical History:  Procedure Laterality Date   APPENDECTOMY  11/17/1982   EYE SURGERY Left    left vitreous   LEG SURGERY Left 01/10/2024   TOTAL HIP ARTHROPLASTY Left 10/17/2014   Procedure: LEFT TOTAL HIP ARTHROPLASTY ANTERIOR APPROACH;  Surgeon: Donnice JONETTA Car, MD;  Location: [facility];  Service: Orthopedics;  Laterality: Left;   Patient Active Problem List   Diagnosis Date Noted   Osteoporosis without current pathological fracture 10/12/2023   Moderate persistent asthma 08/10/2023   Bronchitis 06/18/2021   Leukocytosis 06/17/2021   Environmental and seasonal allergies 10/07/2018   Chronic sinusitis 10/07/2018   H/O sinus surgery 10/07/2018   Hyperlipidemia 10/07/2018   S/P left THA, AA 10/17/2014   Recurrent HSV (herpes simplex virus) 04/21/2013   Depression, recurrent (HCC) 02/20/2010   Allergic rhinitis 04/16/2007    PCP: Wallace Joesph LABOR, PA   REFERRING PROVIDER: Chandra Toribio POUR, MD  REFERRING DIAG: [phone] (ICD-10-CM) - Closed fracture of left femur with routine healing, unspecified fracture morphology,  unspecified portion of femur, subsequent encounter  THERAPY DIAG:  Muscle weakness (generalized)  Other abnormalities of gait and mobility  Unsteadiness on feet  Rationale for Evaluation and Treatment: Rehabilitation  ONSET DATE: 01/10/24 DOS  SUBJECTIVE:   SUBJECTIVE STATEMENT: Arrives with L thigh, deep in nature as opposed to muscle soreness. Spent time with grandchildren as well as working in the yard which did not cause any marked increase in symptoms.    PERTINENT HISTORY: HPI: Veronica Barrett 73 y.o. female with past medical history of moderate persistent asthma, hyperlipidemia and depression who presented to the emergency department after a mechanical fall. Patient's fall led to direct strike of her left knee to the ground. Patient noted severe pain and inability to ambulate at that time. Imaging revealed a left distal femoral shaft fracture   Patient was hospitalized for evaluation and management of left distal 3rd femur shaft fracture. Orthopedic surgery evaluated in consultation and she underwent left femur ORIF on 01/10/2024. She has been evaluated by PT/OT postoperatively with recommendations for acute rehab. Pt is medically stable and has been evaluated and found to be appropriate for [facility] and admitted.  PAIN:  Are you having pain? Yes: NPRS scale: 3/10 Pain location: L thigh Pain description: ache Aggravating factors: stairs, prolonged activity Relieving factors: rest   PRECAUTIONS: Fall  RED FLAGS: None  WEIGHT BEARING RESTRICTIONS: No  FALLS:  Has patient fallen in last 6 months? No  OCCUPATION: retired  PLOF: Independent with household mobility with device  PATIENT GOALS: To manage my symptoms and wean of the cane  NEXT MD VISIT: 05/05/23  OBJECTIVE:  Note: Objective measures were completed at Evaluation unless otherwise noted.  DIAGNOSTIC FINDINGS: none available  PATIENT SURVEYS:  LEFS  06-01-24 LEFS 45/80 56.3% MUSCLE LENGTH: Hamstrings:  WFL Thomas test: No restriction  POSTURE: No Significant postural limitations  PALPATION: negative  LOWER EXTREMITY ROM:  Active ROM Right eval Left eval  Hip flexion    Hip extension    Hip abduction    Hip adduction    Hip internal rotation    Hip external rotation    Knee flexion 125d 118d  Knee extension 0d 0d  Ankle dorsiflexion    Ankle plantarflexion    Ankle inversion    Ankle eversion     (Blank rows = not tested)  LOWER EXTREMITY MMT:  MMT Right eval Left eval  Hip flexion    Hip extension    Hip abduction  4-  Hip adduction    Hip internal rotation    Hip external rotation    Knee flexion  4-  Knee extension  4-  Ankle dorsiflexion    Ankle plantarflexion    Ankle inversion    Ankle eversion     (Blank rows = not tested)  LOWER EXTREMITY SPECIAL TESTS:  deferred  FUNCTIONAL TESTS:  30 seconds chair stand test 9 reps w/o Ues   GAIT: Distance walked: 67ftx2 Assistive device utilized: Single point cane Level of assistance: Complete Independence Comments: unremarkable                                                                                                                                TREATMENT:  [facility] Adult PT Treatment:                                                DATE: 06/06/24 Therapeutic Exercise: Nustep L6 8 min Neuromuscular re-ed: Cable walk 10# 5x 4-way Hip hike 5x B Therapeutic Activity: Supine hip fallouts BluTB 15x B, 15/15 unilaterally Bridge against BluTB 15x B clams S/L 15/15 BluTB  [facility] Adult PT Treatment:                                                DATE: 06-01-24 Pt enters building ambulating independently with SPC. Treatment took place in water 3.8 to  4 ft 8 in. deep depending upon activity.  Pt entered and exited the pool via stair and handrails independently. Temperature 92 degrees. Initial pain 3/10  in left hip. 2/10 over all soreness LEFS  45/80   Aquatic HEP  Access Code: [email or web address]  in process    Aquatic Exercise: Standing with UE support edge of pool and using aquatic cuffs Standing Balance on Noodle at [employer] High Knee Balance with Forward Walking and Hand Floats   High Knee Balance with Backward Walking with Hand Floats   Hip Extension Stomps 3 way hip stretch with yellow noodle for IT band and adductor and hamstring Hip hinge to submerged bench with even wt bearing on bil LE with VC Hip abd/add  Hip ext/flex with knee straight  L back stretch Hamstring curl BIL Squats 2x15 Heel raises x20 Runners stretch bottom step x30 BIL Hamstring stretch bottom step x30 BIL Sit to Stand on 3rd submerged step with UE support   Pt requires the buoyancy of water for active assisted exercises with buoyancy supported for strengthening and AROM exercises. Hydrostatic pressure also supports joints by unweighting joint load by at least 50 % in 3-4 feet depth water. 80% in chest to neck deep water. Water will provide assistance with movement using the current and laminar flow while the buoyancy reduces weight bearing. Pt requires the viscosity of the water for resistance with strengthening exercises.   [facility] Adult PT Treatment:                                                DATE: 05/30/24 Therapeutic Exercise: Nustep L6 8 min Neuromuscular re-ed: Runners step 4 in 15/15 5# KB Sidestepping against yellow band at counter 2 trips STS from airex pad arms crossed, no bracing observed. Therapeutic Activity: Supine hip fallouts BluTB 10x B, 10/10 unilaterally Bridge against BluTB 10x B clams S/L 10/10 BluTB SL bridge 10/10  [facility] Adult PT Treatment:                                                DATE: 05-25-24 Pt enters building ambulating independently with SPC. Treatment took place in water 3.8 to  4 ft 8 in. deep depending upon activity.  Pt entered and exited the pool via stair and handrails independently. Temperature 92 degrees. Initial pain 2/10 in left hip.   Aquatic  Exercise: Initially ambulating forward , backward and side stepping to acclimate to water using yellow DB submerged in water.  Worked on forward progression and decreased side to side waddling. Standing with UE support edge of pool and using aquatic cuffs Aqua stretch to left lateral thigh and left buttock over left SI  with decreased muscle tension Hip Extension Stomps 3 way hip stretch with yellow noodle for IT band and adductor and hamstring Hip hinge to submerged bench with even wt bearing on bil LE with VC Hip abd/add  Hip ext/flex with knee straight  L back stretch Hamstring curl BIL Squats 2x15 Heel raises x20 Runners stretch bottom step x30 BIL Hamstring stretch bottom step x30 BIL Up and down steps with alternating steps and VC not to take steps one at time.   Pt requires the buoyancy of water for active assisted exercises with buoyancy supported for strengthening and AROM exercises. Hydrostatic pressure also supports joints by unweighting joint load by at least 50 %  in 3-4 feet depth water. 80% in chest to neck deep water. Water will provide assistance with movement using the current and laminar flow while the buoyancy reduces weight bearing. Pt requires the viscosity of the water for resistance with strengthening exercises.    [facility] Adult PT Treatment:                                                DATE: 05/23/24 Therapeutic Exercise: Nustep L5 8 min Neuromuscular re-ed: Supine hip fallouts GTB 15x B, 15/15 unilaterally Bridge against GTB 15x L clams in R S/L 15x GTB P-ball curl ups 15x B, 15/15 unilaterally STS from airex pad 10x  Therapeutic Activity: Seated L hamstring stretch 30s x2 Supine L hip flexor stretch 30s x2 L QL stretch 30s x2 FAQs with ball squeeze 15x   [facility] Adult PT Treatment:                                                DATE: 05-18-24 Pt enters building ambulating independently with SPC. Treatment took place in water 3.8 to  4 ft 8 in. deep depending  upon activity.  Pt entered and exited the pool via stair and handrails independently. Temperature 90 degrees Patient entered water for aquatic therapy for first time and was introduced to principles and therapeutic effects of water as she ambulated and acclimated to pool.    Aquatic Exercise: Initially ambulating forward , backward and side stepping to acclimate to water Standing with UE support edge of pool: Aqua stretch to left lateral thigh with decreased muscle tension Hip hinge to submerged bench with even wt bearing on bil LE with VC Hip abd/add  Hip ext/flex with knee straight  L back stretch Hamstring curl BIL Squats 2x15 Heel raises x20 Runners stretch bottom step x30 BIL Hamstring stretch bottom step x30 BIL Hip Extension Stomps 3 way hip stretch with yellow noodle for IT band and adductor and hamstring Sitting on submerged bench Bicycle kicks x1' Reverse Bicycle kicks 1 ' Flutter kicks x 1 '  Scissor kicks x 1'  Pt requires the buoyancy of water for active assisted exercises with buoyancy supported for strengthening and AROM exercises. Hydrostatic pressure also supports joints by unweighting joint load by at least 50 % in 3-4 feet depth water. 80% in chest to neck deep water. Water will provide assistance with movement using the current and laminar flow while the buoyancy reduces weight bearing. Pt requires the viscosity of the water for resistance with strengthening exercises.  [facility] Adult PT Treatment:                                                DATE: 05/16/34 Therapeutic Exercise: Nustep L4 6 min Neuromuscular re-ed: Supine hip fallouts GTB 15x B, 15/15 unilaterally Bridge against GTB 15x L clams in R S/L 15x GTB P-ball curl ups 15x B, 15/15 unilaterally Therapeutic Activity: 2 MWT 336ft w/o cane STS from airex 10x Runners step 4 in 10/10 needing UE Support  [facility] Adult PT Treatment:  DATE: 05/13/24 Therapeutic  Exercise: Nustep L3 8 min Neuromuscular re-ed: Supine hip fallouts GTB 15x B, 15/15 unilaterally Bridge against GTB 15x L clams in R S/L 15x GTB P-ball curl ups 10x B, 10/10 unilaterally Therapeutic Activity: Seated L hamstring stretch 30s x2 Supine L hip flexor stretch 30s x2 L QL stretch 30s x2  [facility] Adult PT Treatment:                                                DATE: 05/03/2024  Therapeutic Exercise: NuStep  8'  Seated L hamstring stretch w/APT and hip ER 2x1' Therapeutic Activity: Supine hip fall out 2x12 B, hold 2s, GTB Supine bridge w/GTB 2x12, hold 4s Standing hip abduction 2x12, hold 2s Split stance LLE TKE 1x12, hold 3s   [facility] Adult PT Treatment:                                                DATE: 04/28/24 Therapeutic Exercise: Nustep L2 8 min Seated L hamstring stretch 30s x2 Supine L hip flexor stretch 30s x2 Neuromuscular re-ed: Supine hip fallouts RTB 15x B, 15/15 unilaterally Bridge against RTB S/L clams RTB 15/15 Therapeutic Activity: STS from airex pad PPT 3s 15x PPT with LE march 10x  Bridge with ball 15x   PATIENT EDUCATION:  Education details: Discussed eval findings, rehab rationale and POC and patient is in agreement  Person educated: Patient Education method: Explanation and Handouts Education comprehension: verbalized understanding and needs further education  HOME EXERCISE PROGRAM: Access Code: [email or web address] URL: [email or web address] Date: 04/20/2024 Prepared by: Reyes Kohut  Exercises - Supine Quad Set  - 2-3 x daily - 5 x weekly - 1 sets - 10 reps - 3s hold - Small Range Straight Leg Raise  - 2-3 x daily - 5 x weekly - 1 sets - 10 reps - Clamshell with Resistance  - 2-3 x daily - 5 x weekly - 1 sets - 10 reps Aquatic HEP Access Code: [email or web address]   ASSESSMENT:  CLINICAL IMPRESSION: Introduced cable walks to challenge gait and balance.  Leg length assessment unremarkable for discrepancy.  Increased reps with hip  strengthening tasks.  L glute medius weakness exposed by hip hiking.  (10th visit)Progress note today.  Pt showing improvement and comments she is able to do more than she die at evaluation.  Session today focused on hip and core strengthening in the aquatic environment for use of buoyancy to offload joints and the viscosity of water as resistance during therapeutic exercise. Pt show consistently improved activity tolerance session to session. PT LEFS score 45/80 with improvement from eval.  30  STS 8 ( eval 9) with wt bear to Right and complaint of soreness impeding increased # of STS today but pt is able to perform exercises with confidence in water.   Patient was able to tolerate all prescribed exercises in the aquatic environment with no adverse effects and reports 3/10 pain initially and decreased to 0/10 at end of session.  Aquatic HEP being formulated for distribution next visit. Pt continues to have more dependence on R LE with transition motion showing decreased balance/ proprioception and left gluteal  weakness.  Patient continues to benefit from skilled PT services on land  and aquatic based and should be progressed as able to improve functional independence  .     EVAL- Patient is a 73 y.o. female who was seen today for physical therapy evaluation and treatment for L LE weakness and dysfunction following fracture and ORIF 01/10/24.  Strength deficits note with 30s chair stand test, no leg length identified, extensor lag observed with SLR, L abduction strength limited.  Patient is a good candidate for OPPT to resolve strength, balance and ROM deficits and wean off cane when appropriate  OBJECTIVE IMPAIRMENTS: Abnormal gait, decreased activity tolerance, decreased balance, decreased endurance, decreased mobility, difficulty walking, decreased ROM, decreased strength, and pain.   ACTIVITY LIMITATIONS: carrying, lifting, standing, squatting, and stairs  PERSONAL FACTORS: Age, Past/current  experiences, and Time since onset of injury/illness/exacerbation are also affecting patient's functional outcome.   REHAB POTENTIAL: Good  CLINICAL DECISION MAKING: Stable/uncomplicated  EVALUATION COMPLEXITY: Low   GOALS: Goals reviewed with patient? No  SHORT TERM GOALS: Target date: 05/11/2024   Patient to demonstrate independence in HEP  Baseline: [email or web address] Goal status: Met  2.  Assess 2 MWT Baseline: TBD; 05/16/24 348ft w/o cane Goal status: Met   LONG TERM GOALS: Target date: 06/01/2024    Patient will increase 30s chair stand reps from 9 to 12 without arms to demonstrate and improved functional ability with less pain/difficulty as well as reduce fall risk.  Baseline: 9 06-01-24  8  with wt bear to the Right Goal status: ONGOING  2.  Patient will acknowledge 2/10 pain at least once during episode of care   Baseline: 4/10 06-01-24  2/10 but improving Goal status:ONGOING  3.  Patient will score at least 60/80 on LEFS to signify clinically meaningful improvement in functional abilities.   Baseline: 36/80 06-01-24  45/60 Goal status: ONGOING  4.  Increase L hip and knee strength to 4/5 in deficit regions Baseline: 4-/5 knee and hip abduction strength Goal status: ONGOING  5.  125d L knee flexion Baseline: 118d L knee flexion Goal status: ONGOING  6.  Patient to negotiate 16 steps with most appropriate pattern Baseline: TBD Goal status: ONGOING   PLAN:  PT FREQUENCY: 2x/week  PT DURATION: 4 weeks  PLANNED INTERVENTIONS: 97110-Therapeutic exercises, 97530- Therapeutic activity, 97112- Neuromuscular re-education, 97535- Self Care, [id number]- Manual therapy, [phone]- Gait training, [phone]- Aquatic Therapy, Patient/Family education, Balance training, and Stair training  PLAN FOR NEXT SESSION: HEP review and update, manual techniques as appropriate, aerobic tasks, ROM and flexibility activities, strengthening and PREs, TPDN, gait and balance training as needed      Graydon Dingwall, PT, ATRIC Certified Exercise Expert for the Aging Adult  06/06/24 12:53 PM Phone: [phone] Fax: [phone]  06/06/2024, 12:53 PM

## 2024-06-06 ENCOUNTER — Ambulatory Visit

## 2024-06-06 DIAGNOSIS — M6281 Muscle weakness (generalized): Secondary | ICD-10-CM

## 2024-06-06 DIAGNOSIS — R2681 Unsteadiness on feet: Secondary | ICD-10-CM | POA: Diagnosis not present

## 2024-06-06 DIAGNOSIS — R2689 Other abnormalities of gait and mobility: Secondary | ICD-10-CM

## 2024-06-07 NOTE — Therapy (Signed)
 OUTPATIENT PHYSICAL THERAPY TREATMENT NOTE Progress Note Reporting Period 04-20-24 to 06-01-24  See note below for Objective Data and Assessment of Progress/Goals.       Patient Name: Veronica Barrett MRN: [id number] DOB:1951-02-29, 73 y.o., female Today's Date: 06/07/2024  END OF SESSION:            Past Medical History:  Diagnosis Date   Allergy    seasonal allergy   Arthritis    left hip   Asthma    Depression    HSV infection    vag   Past Surgical History:  Procedure Laterality Date   APPENDECTOMY  11/17/1982   EYE SURGERY Left    left vitreous   LEG SURGERY Left 01/10/2024   TOTAL HIP ARTHROPLASTY Left 10/17/2014   Procedure: LEFT TOTAL HIP ARTHROPLASTY ANTERIOR APPROACH;  Surgeon: Donnice JONETTA Car, MD;  Location: [facility];  Service: Orthopedics;  Laterality: Left;   Patient Active Problem List   Diagnosis Date Noted   Osteoporosis without current pathological fracture 10/12/2023   Moderate persistent asthma 08/10/2023   Bronchitis 06/18/2021   Leukocytosis 06/17/2021   Environmental and seasonal allergies 10/07/2018   Chronic sinusitis 10/07/2018   H/O sinus surgery 10/07/2018   Hyperlipidemia 10/07/2018   S/P left THA, AA 10/17/2014   Recurrent HSV (herpes simplex virus) 04/21/2013   Depression, recurrent (HCC) 02/20/2010   Allergic rhinitis 04/16/2007    PCP: Wallace Joesph LABOR, PA   REFERRING PROVIDER: Chandra Toribio POUR, MD  REFERRING DIAG: [phone] (ICD-10-CM) - Closed fracture of left femur with routine healing, unspecified fracture morphology, unspecified portion of femur, subsequent encounter  THERAPY DIAG:  No diagnosis found.  Rationale for Evaluation and Treatment: Rehabilitation  ONSET DATE: 01/10/24 DOS  SUBJECTIVE:   SUBJECTIVE STATEMENT: Arrives with L thigh, deep in nature as opposed to muscle soreness. Spent time with grandchildren as well as working in the yard which did not cause any marked increase in symptoms.    PERTINENT  HISTORY: HPI: [place] Basic 73 y.o. female with past medical history of moderate persistent asthma, hyperlipidemia and depression who presented to the emergency department after a mechanical fall. Patient's fall led to direct strike of her left knee to the ground. Patient noted severe pain and inability to ambulate at that time. Imaging revealed a left distal femoral shaft fracture   Patient was hospitalized for evaluation and management of left distal 3rd femur shaft fracture. Orthopedic surgery evaluated in consultation and she underwent left femur ORIF on 01/10/2024. She has been evaluated by PT/OT postoperatively with recommendations for acute rehab. Pt is medically stable and has been evaluated and found to be appropriate for [facility] and admitted.  PAIN:  Are you having pain? Yes: NPRS scale: 3/10 Pain location: L thigh Pain description: ache Aggravating factors: stairs, prolonged activity Relieving factors: rest   PRECAUTIONS: Fall  RED FLAGS: None   WEIGHT BEARING RESTRICTIONS: No  FALLS:  Has patient fallen in last 6 months? No  OCCUPATION: retired  PLOF: Independent with household mobility with device  PATIENT GOALS: To manage my symptoms and wean of the cane  NEXT MD VISIT: 05/05/23  OBJECTIVE:  Note: Objective measures were completed at Evaluation unless otherwise noted.  DIAGNOSTIC FINDINGS: none available  PATIENT SURVEYS:  LEFS  06-01-24 LEFS 45/80 56.3% MUSCLE LENGTH: Hamstrings: WFL Thomas test: No restriction  POSTURE: No Significant postural limitations  PALPATION: negative  LOWER EXTREMITY ROM:  Active ROM Right eval Left eval  Hip flexion    Hip  extension    Hip abduction    Hip adduction    Hip internal rotation    Hip external rotation    Knee flexion 125d 118d  Knee extension 0d 0d  Ankle dorsiflexion    Ankle plantarflexion    Ankle inversion    Ankle eversion     (Blank rows = not tested)  LOWER EXTREMITY MMT:  MMT Right eval  Left eval  Hip flexion    Hip extension    Hip abduction  4-  Hip adduction    Hip internal rotation    Hip external rotation    Knee flexion  4-  Knee extension  4-  Ankle dorsiflexion    Ankle plantarflexion    Ankle inversion    Ankle eversion     (Blank rows = not tested)  LOWER EXTREMITY SPECIAL TESTS:  deferred  FUNCTIONAL TESTS:  30 seconds chair stand test 9 reps w/o Ues   GAIT: Distance walked: 76ftx2 Assistive device utilized: Single point cane Level of assistance: Complete Independence Comments: unremarkable                                                                                                                                TREATMENT:  [facility] Adult PT Treatment:                                                DATE: *** Pt enters building ambulating independently with SPC. Treatment took place in water 3.8 to  4 ft 8 in. deep depending upon activity.  Pt entered and exited the pool via stair and handrails independently. Temperature 92 degrees. Initial pain 2/10 in left hip. Access Code: [email or web address] URL: [email or web address] Date: 06/07/2024 Prepared by: Graydon Dingwall  Exercises - Standing Hip Abduction Adduction at [facility]  - Hip Swing   - Farmer's Walk   - Step Up   - Sit to Stand Without Arm Support   - Standing 'L' Stretch at Counter   - Side stepping with or without Hand Floats   - High Knee Balance with Forward Walking   - High Knee Balance with Backward Walking   - Standing Balance on Noodle at [employer]   - Tandem Balance on Noodle at [employer] - Squat   - Standing Knee Flexion   - Standing Hip Extension Stomps with Pool Noodle  - 3 -way Leg Stretch with Pool Noodle  - Heel Toe Raises at [employer]  - Standing Piriformis Release in Figure 4 Position with Ball at Wall  Pt requires the buoyancy of water for active assisted exercises with buoyancy supported for strengthening and AROM exercises. Hydrostatic pressure also  supports joints by unweighting joint load by at least 50 % in 3-4 feet depth water. 80% in chest to neck deep  water. Water will provide assistance with movement using the current and laminar flow while the buoyancy reduces weight bearing. Pt requires the viscosity of the water for resistance with strengthening exercises.   [facility] Adult PT Treatment:                                                DATE: 06/06/24 Therapeutic Exercise: Nustep L6 8 min Neuromuscular re-ed: Cable walk 10# 5x 4-way Hip hike 5x B Therapeutic Activity: Supine hip fallouts BluTB 15x B, 15/15 unilaterally Bridge against BluTB 15x B clams S/L 15/15 BluTB  [facility] Adult PT Treatment:                                                DATE: 06-01-24 Pt enters building ambulating independently with SPC. Treatment took place in water 3.8 to  4 ft 8 in. deep depending upon activity.  Pt entered and exited the pool via stair and handrails independently. Temperature 92 degrees. Initial pain 3/10 in left hip. 2/10 over all soreness LEFS  45/80   Aquatic HEP  Access Code: [email or web address]  in process   Aquatic Exercise: Standing with UE support edge of pool and using aquatic cuffs Standing Balance on Noodle at [employer] High Knee Balance with Forward Walking and Hand Floats   High Knee Balance with Backward Walking with Hand Floats   Hip Extension Stomps 3 way hip stretch with yellow noodle for IT band and adductor and hamstring Hip hinge to submerged bench with even wt bearing on bil LE with VC Hip abd/add  Hip ext/flex with knee straight  L back stretch Hamstring curl BIL Squats 2x15 Heel raises x20 Runners stretch bottom step x30 BIL Hamstring stretch bottom step x30 BIL Sit to Stand on 3rd submerged step with UE support   Pt requires the buoyancy of water for active assisted exercises with buoyancy supported for strengthening and AROM exercises. Hydrostatic pressure also supports joints by unweighting joint load by  at least 50 % in 3-4 feet depth water. 80% in chest to neck deep water. Water will provide assistance with movement using the current and laminar flow while the buoyancy reduces weight bearing. Pt requires the viscosity of the water for resistance with strengthening exercises.   [facility] Adult PT Treatment:                                                DATE: 05/30/24 Therapeutic Exercise: Nustep L6 8 min Neuromuscular re-ed: Runners step 4 in 15/15 5# KB Sidestepping against yellow band at counter 2 trips STS from airex pad arms crossed, no bracing observed. Therapeutic Activity: Supine hip fallouts BluTB 10x B, 10/10 unilaterally Bridge against BluTB 10x B clams S/L 10/10 BluTB SL bridge 10/10  [facility] Adult PT Treatment:                                                DATE: 05-25-24 Pt enters  building ambulating independently with SPC. Treatment took place in water 3.8 to  4 ft 8 in. deep depending upon activity.  Pt entered and exited the pool via stair and handrails independently. Temperature 92 degrees. Initial pain 2/10 in left hip.   Aquatic Exercise: Initially ambulating forward , backward and side stepping to acclimate to water using yellow DB submerged in water.  Worked on forward progression and decreased side to side waddling. Standing with UE support edge of pool and using aquatic cuffs Aqua stretch to left lateral thigh and left buttock over left SI  with decreased muscle tension Hip Extension Stomps 3 way hip stretch with yellow noodle for IT band and adductor and hamstring Hip hinge to submerged bench with even wt bearing on bil LE with VC Hip abd/add  Hip ext/flex with knee straight  L back stretch Hamstring curl BIL Squats 2x15 Heel raises x20 Runners stretch bottom step x30 BIL Hamstring stretch bottom step x30 BIL Up and down steps with alternating steps and VC not to take steps one at time.   Pt requires the buoyancy of water for active assisted exercises with  buoyancy supported for strengthening and AROM exercises. Hydrostatic pressure also supports joints by unweighting joint load by at least 50 % in 3-4 feet depth water. 80% in chest to neck deep water. Water will provide assistance with movement using the current and laminar flow while the buoyancy reduces weight bearing. Pt requires the viscosity of the water for resistance with strengthening exercises.    [facility] Adult PT Treatment:                                                DATE: 05/23/24 Therapeutic Exercise: Nustep L5 8 min Neuromuscular re-ed: Supine hip fallouts GTB 15x B, 15/15 unilaterally Bridge against GTB 15x L clams in R S/L 15x GTB P-ball curl ups 15x B, 15/15 unilaterally STS from airex pad 10x  Therapeutic Activity: Seated L hamstring stretch 30s x2 Supine L hip flexor stretch 30s x2 L QL stretch 30s x2 FAQs with ball squeeze 15x   [facility] Adult PT Treatment:                                                DATE: 05-18-24 Pt enters building ambulating independently with SPC. Treatment took place in water 3.8 to  4 ft 8 in. deep depending upon activity.  Pt entered and exited the pool via stair and handrails independently. Temperature 90 degrees Patient entered water for aquatic therapy for first time and was introduced to principles and therapeutic effects of water as she ambulated and acclimated to pool.    Aquatic Exercise: Initially ambulating forward , backward and side stepping to acclimate to water Standing with UE support edge of pool: Aqua stretch to left lateral thigh with decreased muscle tension Hip hinge to submerged bench with even wt bearing on bil LE with VC Hip abd/add  Hip ext/flex with knee straight  L back stretch Hamstring curl BIL Squats 2x15 Heel raises x20 Runners stretch bottom step x30 BIL Hamstring stretch bottom step x30 BIL Hip Extension Stomps 3 way hip stretch with yellow noodle for IT band and adductor and hamstring Sitting on submerged  bench Bicycle kicks x1' Reverse Bicycle kicks 1 ' Flutter kicks x 1 '  Scissor kicks x 1'  Pt requires the buoyancy of water for active assisted exercises with buoyancy supported for strengthening and AROM exercises. Hydrostatic pressure also supports joints by unweighting joint load by at least 50 % in 3-4 feet depth water. 80% in chest to neck deep water. Water will provide assistance with movement using the current and laminar flow while the buoyancy reduces weight bearing. Pt requires the viscosity of the water for resistance with strengthening exercises.  [facility] Adult PT Treatment:                                                DATE: 05/16/34 Therapeutic Exercise: Nustep L4 6 min Neuromuscular re-ed: Supine hip fallouts GTB 15x B, 15/15 unilaterally Bridge against GTB 15x L clams in R S/L 15x GTB P-ball curl ups 15x B, 15/15 unilaterally Therapeutic Activity: 2 MWT 363ft w/o cane STS from airex 10x Runners step 4 in 10/10 needing UE Support  [facility] Adult PT Treatment:                                                DATE: 05/13/24 Therapeutic Exercise: Nustep L3 8 min Neuromuscular re-ed: Supine hip fallouts GTB 15x B, 15/15 unilaterally Bridge against GTB 15x L clams in R S/L 15x GTB P-ball curl ups 10x B, 10/10 unilaterally Therapeutic Activity: Seated L hamstring stretch 30s x2 Supine L hip flexor stretch 30s x2 L QL stretch 30s x2  [facility] Adult PT Treatment:                                                DATE: 05/03/2024  Therapeutic Exercise: NuStep  8'  Seated L hamstring stretch w/APT and hip ER 2x1' Therapeutic Activity: Supine hip fall out 2x12 B, hold 2s, GTB Supine bridge w/GTB 2x12, hold 4s Standing hip abduction 2x12, hold 2s Split stance LLE TKE 1x12, hold 3s   [facility] Adult PT Treatment:                                                DATE: 04/28/24 Therapeutic Exercise: Nustep L2 8 min Seated L hamstring stretch 30s x2 Supine L hip flexor stretch 30s  x2 Neuromuscular re-ed: Supine hip fallouts RTB 15x B, 15/15 unilaterally Bridge against RTB S/L clams RTB 15/15 Therapeutic Activity: STS from airex pad PPT 3s 15x PPT with LE march 10x  Bridge with ball 15x   PATIENT EDUCATION:  Education details: Discussed eval findings, rehab rationale and POC and patient is in agreement  Person educated: Patient Education method: Explanation and Handouts Education comprehension: verbalized understanding and needs further education  HOME EXERCISE PROGRAM: Access Code: [email or web address] URL: [email or web address] Date: 04/20/2024 Prepared by: Reyes Kohut  Exercises - Supine Quad Set  - 2-3 x daily - 5 x weekly - 1 sets - 10 reps - 3s hold - Small Range Straight Leg  Raise  - 2-3 x daily - 5 x weekly - 1 sets - 10 reps - Clamshell with Resistance  - 2-3 x daily - 5 x weekly - 1 sets - 10 reps Aquatic HEP Access Code: [email or web address] URL: [email or web address] Date: 06/07/2024 Prepared by: Graydon Dingwall   ASSESSMENT:  CLINICAL IMPRESSION: Introduced cable walks to challenge gait and balance.  Leg length assessment unremarkable for discrepancy.  Increased reps with hip strengthening tasks.  L glute medius weakness exposed by hip hiking.  (10th visit)Progress note today.  Pt showing improvement and comments she is able to do more than she die at evaluation.  Session today focused on hip and core strengthening in the aquatic environment for use of buoyancy to offload joints and the viscosity of water as resistance during therapeutic exercise. Pt show consistently improved activity tolerance session to session. PT LEFS score 45/80 with improvement from eval.  30  STS 8 ( eval 9) with wt bear to Right and complaint of soreness impeding increased # of STS today but pt is able to perform exercises with confidence in water.   Patient was able to tolerate all prescribed exercises in the aquatic environment with no adverse effects and  reports 3/10 pain initially and decreased to 0/10 at end of session.  Aquatic HEP being formulated for distribution next visit. Pt continues to have more dependence on R LE with transition motion showing decreased balance/ proprioception and left gluteal  weakness.  Patient continues to benefit from skilled PT services on land and aquatic based and should be progressed as able to improve functional independence  .     EVAL- Patient is a 73 y.o. female who was seen today for physical therapy evaluation and treatment for L LE weakness and dysfunction following fracture and ORIF 01/10/24.  Strength deficits note with 30s chair stand test, no leg length identified, extensor lag observed with SLR, L abduction strength limited.  Patient is a good candidate for OPPT to resolve strength, balance and ROM deficits and wean off cane when appropriate  OBJECTIVE IMPAIRMENTS: Abnormal gait, decreased activity tolerance, decreased balance, decreased endurance, decreased mobility, difficulty walking, decreased ROM, decreased strength, and pain.   ACTIVITY LIMITATIONS: carrying, lifting, standing, squatting, and stairs  PERSONAL FACTORS: Age, Past/current experiences, and Time since onset of injury/illness/exacerbation are also affecting patient's functional outcome.   REHAB POTENTIAL: Good  CLINICAL DECISION MAKING: Stable/uncomplicated  EVALUATION COMPLEXITY: Low   GOALS: Goals reviewed with patient? No  SHORT TERM GOALS: Target date: 05/11/2024   Patient to demonstrate independence in HEP  Baseline: [email or web address] Goal status: Met  2.  Assess 2 MWT Baseline: TBD; 05/16/24 341ft w/o cane Goal status: Met   LONG TERM GOALS: Target date: 06/01/2024    Patient will increase 30s chair stand reps from 9 to 12 without arms to demonstrate and improved functional ability with less pain/difficulty as well as reduce fall risk.  Baseline: 9 06-01-24  8  with wt bear to the Right Goal status: ONGOING  2.  Patient  will acknowledge 2/10 pain at least once during episode of care   Baseline: 4/10 06-01-24  2/10 but improving Goal status:ONGOING  3.  Patient will score at least 60/80 on LEFS to signify clinically meaningful improvement in functional abilities.   Baseline: 36/80 06-01-24  45/60 Goal status: ONGOING  4.  Increase L hip and knee strength to 4/5 in deficit regions Baseline: 4-/5 knee and hip abduction strength Goal status: ONGOING  5.  125d L knee  flexion Baseline: 118d L knee flexion Goal status: ONGOING  6.  Patient to negotiate 16 steps with most appropriate pattern Baseline: TBD Goal status: ONGOING   PLAN:  PT FREQUENCY: 2x/week  PT DURATION: 4 weeks  PLANNED INTERVENTIONS: 97110-Therapeutic exercises, 97530- Therapeutic activity, [id number]- Neuromuscular re-education, 97535- Self Care, [id number]- Manual therapy, [phone]- Gait training, [phone]- Aquatic Therapy, Patient/Family education, Balance training, and Stair training  PLAN FOR NEXT SESSION: HEP review and update, manual techniques as appropriate, aerobic tasks, ROM and flexibility activities, strengthening and PREs, TPDN, gait and balance training as needed     ***

## 2024-06-08 ENCOUNTER — Ambulatory Visit: Admitting: Physical Therapy

## 2024-06-08 ENCOUNTER — Encounter: Payer: Self-pay | Admitting: Physical Therapy

## 2024-06-08 DIAGNOSIS — R2689 Other abnormalities of gait and mobility: Secondary | ICD-10-CM | POA: Diagnosis not present

## 2024-06-08 DIAGNOSIS — M6281 Muscle weakness (generalized): Secondary | ICD-10-CM | POA: Diagnosis not present

## 2024-06-08 DIAGNOSIS — R2681 Unsteadiness on feet: Secondary | ICD-10-CM

## 2024-06-14 NOTE — Therapy (Unsigned)
 OUTPATIENT PHYSICAL THERAPY TREATMENT NOTE       Patient Name: Veronica Barrett MRN: [id number] DOB:1951-01-25, 73 y.o., female Today's Date: 06/15/2024  END OF SESSION:  PT End of Session - 06/15/24 1002     Visit Number 13    Number of Visits 20    Date for PT Re-Evaluation 06/20/24    Authorization Type MCR    Progress Note Due on Visit 20    PT Start Time 1000    PT Stop Time 1040    PT Time Calculation (min) 40 min    Activity Tolerance Patient tolerated treatment well    Behavior During Therapy [facility] for tasks assessed/performed          Past Medical History:  Diagnosis Date   Allergy    seasonal allergy   Arthritis    left hip   Asthma    Depression    HSV infection    vag   Past Surgical History:  Procedure Laterality Date   APPENDECTOMY  11/17/1982   EYE SURGERY Left    left vitreous   LEG SURGERY Left 01/10/2024   TOTAL HIP ARTHROPLASTY Left 10/17/2014   Procedure: LEFT TOTAL HIP ARTHROPLASTY ANTERIOR APPROACH;  Surgeon: Donnice JONETTA Car, MD;  Location: [facility];  Service: Orthopedics;  Laterality: Left;   Patient Active Problem List   Diagnosis Date Noted   Osteoporosis without current pathological fracture 10/12/2023   Moderate persistent asthma 08/10/2023   Bronchitis 06/18/2021   Leukocytosis 06/17/2021   Environmental and seasonal allergies 10/07/2018   Chronic sinusitis 10/07/2018   H/O sinus surgery 10/07/2018   Hyperlipidemia 10/07/2018   S/P left THA, AA 10/17/2014   Recurrent HSV (herpes simplex virus) 04/21/2013   Depression, recurrent (HCC) 02/20/2010   Allergic rhinitis 04/16/2007    PCP: Wallace Joesph LABOR, PA   REFERRING PROVIDER: Chandra Toribio POUR, MD  REFERRING DIAG: [id number] (ICD-10-CM) - Closed fracture of left femur with routine healing, unspecified fracture morphology, unspecified portion of femur, subsequent encounter  THERAPY DIAG:  Muscle weakness (generalized)  Other abnormalities of gait and mobility  Unsteadiness on  feet  Rationale for Evaluation and Treatment: Rehabilitation  ONSET DATE: 01/10/24 DOS  SUBJECTIVE:   SUBJECTIVE STATEMENT:  Arrives to aquatics with 2/10 pain today.  Ready to go over aquatic HEP Pt reports purchasing a step to use in kitchen for step ups for continuing strength at home.  PERTINENT HISTORY: HPI: Veronica Barrett 73 y.o. female with past medical history of moderate persistent asthma, hyperlipidemia and depression who presented to the emergency department after a mechanical fall. Patient's fall led to direct strike of her left knee to the ground. Patient noted severe pain and inability to ambulate at that time. Imaging revealed a left distal femoral shaft fracture   Patient was hospitalized for evaluation and management of left distal 3rd femur shaft fracture. Orthopedic surgery evaluated in consultation and she underwent left femur ORIF on 01/10/2024. She has been evaluated by PT/OT postoperatively with recommendations for acute rehab. Pt is medically stable and has been evaluated and found to be appropriate for [facility] and admitted.  PAIN:  Are you having pain? Yes: NPRS scale: 3/10 Pain location: L thigh Pain description: ache Aggravating factors: stairs, prolonged activity Relieving factors: rest   PRECAUTIONS: Fall  RED FLAGS: None   WEIGHT BEARING RESTRICTIONS: No  FALLS:  Has patient fallen in last 6 months? No  OCCUPATION: retired  PLOF: Independent with household mobility with device  PATIENT GOALS: To  manage my symptoms and wean of the cane  NEXT MD VISIT: 05/05/23  OBJECTIVE:  Note: Objective measures were completed at Evaluation unless otherwise noted.  DIAGNOSTIC FINDINGS: none available  PATIENT SURVEYS:  LEFS  06-01-24 LEFS 45/80 56.3% MUSCLE LENGTH: Hamstrings: WFL Thomas test: No restriction  POSTURE: No Significant postural limitations  PALPATION: negative  LOWER EXTREMITY ROM:  Active ROM Right eval Left eval  Hip flexion    Hip  extension    Hip abduction    Hip adduction    Hip internal rotation    Hip external rotation    Knee flexion 125d 118d  Knee extension 0d 0d  Ankle dorsiflexion    Ankle plantarflexion    Ankle inversion    Ankle eversion     (Blank rows = not tested)  LOWER EXTREMITY MMT:  MMT Right eval Left eval  Hip flexion    Hip extension    Hip abduction  4-  Hip adduction    Hip internal rotation    Hip external rotation    Knee flexion  4-  Knee extension  4-  Ankle dorsiflexion    Ankle plantarflexion    Ankle inversion    Ankle eversion     (Blank rows = not tested)  LOWER EXTREMITY SPECIAL TESTS:  deferred  FUNCTIONAL TESTS:  30 seconds chair stand test 9 reps w/o Ues   GAIT: Distance walked: 30ftx2 Assistive device utilized: Single point cane Level of assistance: Complete Independence Comments: unremarkable                                                                                                                                TREATMENT:  [facility] Adult PT Treatment:                                                DATE: 06/15/24 Therapeutic Exercise: Nustep L7 8 min  Neuromuscular re-ed: Cable walk 13# 5x 4-way Hip hike 5x B Therapeutic Activity: L hip flexor stretch 30s x2 Supine hip fallouts BlaTB 10x B, 10/10 unilaterally Bridge against BlaTB 10x B clams S/L 10/10 BlaTB  [facility] Adult PT Treatment:                                                DATE: 06-08-24 Pt enters building ambulating independently with SPC. Treatment took place in water 3.8 to  4 ft 8 in. deep depending upon activity.  Pt entered and exited the pool via stair and handrails independently. Temperature 92 degrees. Initial pain 2/10 in left hip. Access Code: [email or web address] URL: [email or web address] Prepared by: Graydon Dingwall  Exercises - Standing Hip Abduction Adduction at [place] [employer]  -  Hip Swing   - Farmer's Walk   - Step Up   - Sit to Stand Without Arm Support   -  Standing 'L' Stretch at [employer]   - Side stepping with or without Hand Floats   - High Knee Balance with Forward Walking   - High Knee Balance with Backward Walking   - Standing Balance on Noodle at [employer]   - Tandem Balance on Noodle at [employer] - Squat   - Standing Knee Flexion   - Standing Hip Extension Stomps with Pool Noodle  - 3 -way Leg Stretch with Pool Noodle  - Heel Toe Raises at [employer]  - Standing Piriformis Release in Figure 4 Position with Ball at Wall  Pt requires the buoyancy of water for active assisted exercises with buoyancy supported for strengthening and AROM exercises. Hydrostatic pressure also supports joints by unweighting joint load by at least 50 % in 3-4 feet depth water. 80% in chest to neck deep water. Water will provide assistance with movement using the current and laminar flow while the buoyancy reduces weight bearing. Pt requires the viscosity of the water for resistance with strengthening exercises.   [facility] Adult PT Treatment:                                                DATE: 06/06/24 Therapeutic Exercise: Nustep L6 8 min Neuromuscular re-ed: Cable walk 10# 5x 4-way Hip hike 5x B Therapeutic Activity: Supine hip fallouts BluTB 15x B, 15/15 unilaterally Bridge against BluTB 15x B clams S/L 15/15 BluTB  [facility] Adult PT Treatment:                                                DATE: 06-01-24 Pt enters building ambulating independently with SPC. Treatment took place in water 3.8 to  4 ft 8 in. deep depending upon activity.  Pt entered and exited the pool via stair and handrails independently. Temperature 92 degrees. Initial pain 3/10 in left hip. 2/10 over all soreness LEFS  45/80   Aquatic HEP  Access Code: [email or web address]  in process   Aquatic Exercise: Standing with UE support edge of pool and using aquatic cuffs Standing Balance on Noodle at [employer] High Knee Balance with Forward Walking and Hand Floats   High Knee Balance with Backward  Walking with Hand Floats   Hip Extension Stomps 3 way hip stretch with yellow noodle for IT band and adductor and hamstring Hip hinge to submerged bench with even wt bearing on bil LE with VC Hip abd/add  Hip ext/flex with knee straight  L back stretch Hamstring curl BIL Squats 2x15 Heel raises x20 Runners stretch bottom step x30 BIL Hamstring stretch bottom step x30 BIL Sit to Stand on 3rd submerged step with UE support   Pt requires the buoyancy of water for active assisted exercises with buoyancy supported for strengthening and AROM exercises. Hydrostatic pressure also supports joints by unweighting joint load by at least 50 % in 3-4 feet depth water. 80% in chest to neck deep water. Water will provide assistance with movement using the current and laminar flow while the buoyancy reduces weight bearing. Pt requires the viscosity of the water  for resistance with strengthening exercises.   [facility] Adult PT Treatment:                                                DATE: 05/30/24 Therapeutic Exercise: Nustep L6 8 min Neuromuscular re-ed: Runners step 4 in 15/15 5# KB Sidestepping against yellow band at counter 2 trips STS from airex pad arms crossed, no bracing observed. Therapeutic Activity: Supine hip fallouts BluTB 10x B, 10/10 unilaterally Bridge against BluTB 10x B clams S/L 10/10 BluTB SL bridge 10/10    PATIENT EDUCATION:  Education details: Discussed eval findings, rehab rationale and POC and patient is in agreement  Person educated: Patient Education method: Explanation and Handouts Education comprehension: verbalized understanding and needs further education  HOME EXERCISE PROGRAM: Access Code: [email or web address] URL: [email or web address] Date: 04/20/2024 Prepared by: Reyes Kohut  Exercises - Supine Quad Set  - 2-3 x daily - 5 x weekly - 1 sets - 10 reps - 3s hold - Small Range Straight Leg Raise  - 2-3 x daily - 5 x weekly - 1 sets - 10 reps - Clamshell  with Resistance  - 2-3 x daily - 5 x weekly - 1 sets - 10 reps Aquatic HEP Access Code: [email or web address] URL: [email or web address] Date: 06/07/2024 Prepared by: Graydon Dingwall   ASSESSMENT:  CLINICAL IMPRESSION:  Feels her gait is smoothing out and only reports mild soreness, no pain.  Increased resistance and challenge as noted to emphasize strength and balance.  (10th visit)Progress note today.  Pt showing improvement and comments she is able to do more than she die at evaluation.  Session today focused on hip and core strengthening in the aquatic environment for use of buoyancy to offload joints and the viscosity of water as resistance during therapeutic exercise. Pt show consistently improved activity tolerance session to session. PT LEFS score 45/80 with improvement from eval.  30  STS 8 ( eval 9) with wt bear to Right and complaint of soreness impeding increased # of STS today but pt is able to perform exercises with confidence in water.   Patient was able to tolerate all prescribed exercises in the aquatic environment with no adverse effects and reports 3/10 pain initially and decreased to 0/10 at end of session.  Aquatic HEP being formulated for distribution next visit. Pt continues to have more dependence on R LE with transition motion showing decreased balance/ proprioception and left gluteal  weakness.  Patient continues to benefit from skilled PT services on land and aquatic based and should be progressed as able to improve functional independence  .     EVAL- Patient is a 73 y.o. female who was seen today for physical therapy evaluation and treatment for L LE weakness and dysfunction following fracture and ORIF 01/10/24.  Strength deficits note with 30s chair stand test, no leg length identified, extensor lag observed with SLR, L abduction strength limited.  Patient is a good candidate for OPPT to resolve strength, balance and ROM deficits and wean off cane when  appropriate  OBJECTIVE IMPAIRMENTS: Abnormal gait, decreased activity tolerance, decreased balance, decreased endurance, decreased mobility, difficulty walking, decreased ROM, decreased strength, and pain.   ACTIVITY LIMITATIONS: carrying, lifting, standing, squatting, and stairs  PERSONAL FACTORS: Age, Past/current experiences, and Time since onset of injury/illness/exacerbation are also affecting patient's functional outcome.   REHAB POTENTIAL: Good  CLINICAL DECISION  MAKING: Stable/uncomplicated  EVALUATION COMPLEXITY: Low   GOALS: Goals reviewed with patient? No  SHORT TERM GOALS: Target date: 05/11/2024   Patient to demonstrate independence in HEP  Baseline: [email or web address] Goal status: Met  2.  Assess 2 MWT Baseline: TBD; 05/16/24 372ft w/o cane Goal status: Met   LONG TERM GOALS: Target date: 06/01/2024    Patient will increase 30s chair stand reps from 9 to 12 without arms to demonstrate and improved functional ability with less pain/difficulty as well as reduce fall risk.  Baseline: 9 06-01-24  8  with wt bear to the Right Goal status: ONGOING  2.  Patient will acknowledge 2/10 pain at least once during episode of care   Baseline: 4/10 06-01-24  2/10 but improving Goal status:ONGOING  3.  Patient will score at least 60/80 on LEFS to signify clinically meaningful improvement in functional abilities.   Baseline: 36/80 06-01-24  45/60 Goal status: ONGOING  4.  Increase L hip and knee strength to 4/5 in deficit regions Baseline: 4-/5 knee and hip abduction strength Goal status: ONGOING  5.  125d L knee flexion Baseline: 118d L knee flexion Goal status: ONGOING  6.  Patient to negotiate 16 steps with most appropriate pattern Baseline: TBD Goal status: ONGOING   PLAN:  PT FREQUENCY: 2x/week  PT DURATION: 4 weeks  PLANNED INTERVENTIONS: 97110-Therapeutic exercises, 97530- Therapeutic activity, 97112- Neuromuscular re-education, 97535- Self Care, [id number]- Manual  therapy, [phone]- Gait training, [phone]- Aquatic Therapy, Patient/Family education, Balance training, and Stair training  PLAN FOR NEXT SESSION: HEP review and update, manual techniques as appropriate, aerobic tasks, ROM and flexibility activities, strengthening and PREs, TPDN, gait and balance training as needed     Jeff Kaytlan Behrman PT  06/15/24 11:43 AM Phone: [phone] Fax: [phone]

## 2024-06-15 ENCOUNTER — Ambulatory Visit

## 2024-06-15 DIAGNOSIS — R2689 Other abnormalities of gait and mobility: Secondary | ICD-10-CM | POA: Diagnosis not present

## 2024-06-15 DIAGNOSIS — R2681 Unsteadiness on feet: Secondary | ICD-10-CM | POA: Diagnosis not present

## 2024-06-15 DIAGNOSIS — M6281 Muscle weakness (generalized): Secondary | ICD-10-CM | POA: Diagnosis not present

## 2024-06-17 ENCOUNTER — Ambulatory Visit: Attending: Family Medicine

## 2024-06-17 DIAGNOSIS — R2681 Unsteadiness on feet: Secondary | ICD-10-CM | POA: Insufficient documentation

## 2024-06-17 DIAGNOSIS — R2689 Other abnormalities of gait and mobility: Secondary | ICD-10-CM | POA: Diagnosis not present

## 2024-06-17 DIAGNOSIS — M6281 Muscle weakness (generalized): Secondary | ICD-10-CM | POA: Diagnosis not present

## 2024-06-17 NOTE — Therapy (Signed)
 OUTPATIENT PHYSICAL THERAPY TREATMENT NOTE   Patient Name: Veronica Barrett MRN: [id number] DOB:[birth year], 73 y.o., female Today's Date: 06/17/2024  END OF SESSION:  PT End of Session - 06/17/24 1407     Visit Number 14    Number of Visits 20    Date for PT Re-Evaluation 06/20/24    Authorization Type MCR    Progress Note Due on Visit 20    PT Start Time 1410    PT Stop Time 1455    PT Time Calculation (min) 45 min    Activity Tolerance Patient tolerated treatment well    Behavior During Therapy [facility] for tasks assessed/performed           Past Medical History:  Diagnosis Date   Allergy    seasonal allergy   Arthritis    left hip   Asthma    Depression    HSV infection    vag   Past Surgical History:  Procedure Laterality Date   APPENDECTOMY  11/17/1982   EYE SURGERY Left    left vitreous   LEG SURGERY Left 01/10/2024   TOTAL HIP ARTHROPLASTY Left 10/17/2014   Procedure: LEFT TOTAL HIP ARTHROPLASTY ANTERIOR APPROACH;  Surgeon: Donnice JONETTA Car, MD;  Location: [facility];  Service: Orthopedics;  Laterality: Left;   Patient Active Problem List   Diagnosis Date Noted   Osteoporosis without current pathological fracture 10/12/2023   Moderate persistent asthma 08/10/2023   Bronchitis 06/18/2021   Leukocytosis 06/17/2021   Environmental and seasonal allergies 10/07/2018   Chronic sinusitis 10/07/2018   H/O sinus surgery 10/07/2018   Hyperlipidemia 10/07/2018   S/P left THA, AA 10/17/2014   Recurrent HSV (herpes simplex virus) 04/21/2013   Depression, recurrent (HCC) 02/20/2010   Allergic rhinitis 04/16/2007    PCP: Wallace Joesph LABOR, PA   REFERRING PROVIDER: Chandra Toribio POUR, MD  REFERRING DIAG: [phone] (ICD-10-CM) - Closed fracture of left femur with routine healing, unspecified fracture morphology, unspecified portion of femur, subsequent encounter  THERAPY DIAG:  Muscle weakness (generalized)  Other abnormalities of gait and mobility  Unsteadiness on  feet  Rationale for Evaluation and Treatment: Rehabilitation  ONSET DATE: 01/10/24 DOS  SUBJECTIVE:   SUBJECTIVE STATEMENT:   Patient reports she did some yard work today, has some Lt thigh pain, but not bad overall.  PERTINENT HISTORY: HPI: Veronica Barrett 73 y.o. female with past medical history of moderate persistent asthma, hyperlipidemia and depression who presented to the emergency department after a mechanical fall. Patient's fall led to direct strike of her left knee to the ground. Patient noted severe pain and inability to ambulate at that time. Imaging revealed a left distal femoral shaft fracture   Patient was hospitalized for evaluation and management of left distal 3rd femur shaft fracture. Orthopedic surgery evaluated in consultation and she underwent left femur ORIF on 01/10/2024. She has been evaluated by PT/OT postoperatively with recommendations for acute rehab. Pt is medically stable and has been evaluated and found to be appropriate for [facility] and admitted.  PAIN:  Are you having pain? Yes: NPRS scale: 3/10 Pain location: L thigh Pain description: ache Aggravating factors: stairs, prolonged activity Relieving factors: rest   PRECAUTIONS: Fall  RED FLAGS: None   WEIGHT BEARING RESTRICTIONS: No  FALLS:  Has patient fallen in last 6 months? No  OCCUPATION: retired  PLOF: Independent with household mobility with device  PATIENT GOALS: To manage my symptoms and wean of the cane  NEXT MD VISIT: None scheduled at this  time  OBJECTIVE:  Note: Objective measures were completed at Evaluation unless otherwise noted.  DIAGNOSTIC FINDINGS: none available  PATIENT SURVEYS:  LEFS  06-01-24 LEFS 45/80 56.3% MUSCLE LENGTH: Hamstrings: WFL Thomas test: No restriction  POSTURE: No Significant postural limitations  PALPATION: negative  LOWER EXTREMITY ROM:  Active ROM Right eval Left eval  Hip flexion    Hip extension    Hip abduction    Hip adduction    Hip  internal rotation    Hip external rotation    Knee flexion 125d 118d  Knee extension 0d 0d  Ankle dorsiflexion    Ankle plantarflexion    Ankle inversion    Ankle eversion     (Blank rows = not tested)  LOWER EXTREMITY MMT:  MMT Right eval Left eval  Hip flexion    Hip extension    Hip abduction  4-  Hip adduction    Hip internal rotation    Hip external rotation    Knee flexion  4-  Knee extension  4-  Ankle dorsiflexion    Ankle plantarflexion    Ankle inversion    Ankle eversion     (Blank rows = not tested)  LOWER EXTREMITY SPECIAL TESTS:  deferred  FUNCTIONAL TESTS:  30 seconds chair stand test 9 reps w/o Ues   GAIT: Distance walked: 87ftx2 Assistive device utilized: Single point cane Level of assistance: Complete Independence Comments: unremarkable                                                                                                                                TREATMENT:  [facility] Adult PT Treatment:                                                DATE: 06/17/24 Pt enters building ambulating independently with SPC. Treatment took place in water 3.8 to  4 ft 8 in. deep depending upon activity.  Pt entered and exited the pool via stair and handrails independently. Temperature 92 degrees.  Exercises Walking forwards/backwards/sidestepping x2 laps ea Standing Hip Abduction Adduction at [employer]  Heel/toe raises x20 Hip Swing x20 BIL Farmer's Walk yellow DB x1 lap Suitcase carry yellow DB x2 laps Step Up x10 BIL Sit to Stand from 3rd step from bottom no UE x10 Standing 'L' Stretch at pool wall x10 hold x10 Side stepping with yellow DB shoulder abd/add x2 laps High Knee Balance with Forward Walking (NT) High Knee Balance with Backward Walking (NT) Standing Balance on Noodle at [employer] 2x20 Standing hip flexion to knee extension 2x10 BIL Standing Hip Extension Stomps with Pool Noodle x15 BIL Standing Piriformis Release in Figure 4  Position x30 BIL  Pt requires the buoyancy of water for active assisted exercises with buoyancy supported for strengthening  and AROM exercises. Hydrostatic pressure also supports joints by unweighting joint load by at least 50 % in 3-4 feet depth water. 80% in chest to neck deep water. Water will provide assistance with movement using the current and laminar flow while the buoyancy reduces weight bearing. Pt requires the viscosity of the water for resistance with strengthening exercises.  [facility] Adult PT Treatment:                                                DATE: 06/15/24 Therapeutic Exercise: Nustep L7 8 min  Neuromuscular re-ed: Cable walk 13# 5x 4-way Hip hike 5x B Therapeutic Activity: L hip flexor stretch 30s x2 Supine hip fallouts BlaTB 10x B, 10/10 unilaterally Bridge against BlaTB 10x B clams S/L 10/10 BlaTB  [facility] Adult PT Treatment:                                                DATE: 06-08-24 Pt enters building ambulating independently with SPC. Treatment took place in water 3.8 to  4 ft 8 in. deep depending upon activity.  Pt entered and exited the pool via stair and handrails independently. Temperature 92 degrees. Initial pain 2/10 in left hip. Access Code: [email or web address] URL: [email or web address] Prepared by: Graydon Dingwall  Exercises - Standing Hip Abduction Adduction at [facility]  - Hip Swing   - Farmer's Walk   - Step Up   - Sit to Stand Without Arm Support   - Standing 'L' Stretch at Counter   - Side stepping with or without Hand Floats   - High Knee Balance with Forward Walking   - High Knee Balance with Backward Walking   - Standing Balance on Noodle at [employer]   - Tandem Balance on Noodle at [employer] - Squat   - Standing Knee Flexion   - Standing Hip Extension Stomps with Pool Noodle  - 3 -way Leg Stretch with Pool Noodle  - Heel Toe Raises at [employer]  - Standing Piriformis Release in Figure 4 Position with Ball at Wall  Pt requires the  buoyancy of water for active assisted exercises with buoyancy supported for strengthening and AROM exercises. Hydrostatic pressure also supports joints by unweighting joint load by at least 50 % in 3-4 feet depth water. 80% in chest to neck deep water. Water will provide assistance with movement using the current and laminar flow while the buoyancy reduces weight bearing. Pt requires the viscosity of the water for resistance with strengthening exercises.    PATIENT EDUCATION:  Education details: Discussed eval findings, rehab rationale and POC and patient is in agreement  Person educated: Patient Education method: Explanation and Handouts Education comprehension: verbalized understanding and needs further education  HOME EXERCISE PROGRAM: Access Code: [email or web address] URL: [email or web address] Date: 04/20/2024 Prepared by: Reyes Kohut  Exercises - Supine Quad Set  - 2-3 x daily - 5 x weekly - 1 sets - 10 reps - 3s hold - Small Range Straight Leg Raise  - 2-3 x daily - 5 x weekly - 1 sets - 10 reps - Clamshell with Resistance  - 2-3 x daily - 5 x weekly - 1 sets - 10 reps Aquatic HEP Access Code: [email or web address] URL: [email or web address] Date:  06/07/2024 Prepared by: Graydon Dingwall   ASSESSMENT:  CLINICAL IMPRESSION:   Patient presents to aquatic PT reporting minimal pain in her left thigh, felt the last land session was challenging but good. Session today focused on LE strengthening and ambulation as well as balance tasks in the aquatic environment for use of buoyancy to offload joints and the viscosity of water as resistance during therapeutic exercise. Patient was able to tolerate all prescribed exercises with no adverse effects. Patient continues to benefit from skilled PT services and should be progressed as able to improve functional independence.   EVAL- Patient is a 73 y.o. female who was seen today for physical therapy evaluation and treatment for L LE  weakness and dysfunction following fracture and ORIF 01/10/24.  Strength deficits note with 30s chair stand test, no leg length identified, extensor lag observed with SLR, L abduction strength limited.  Patient is a good candidate for OPPT to resolve strength, balance and ROM deficits and wean off cane when appropriate  OBJECTIVE IMPAIRMENTS: Abnormal gait, decreased activity tolerance, decreased balance, decreased endurance, decreased mobility, difficulty walking, decreased ROM, decreased strength, and pain.   ACTIVITY LIMITATIONS: carrying, lifting, standing, squatting, and stairs  PERSONAL FACTORS: Age, Past/current experiences, and Time since onset of injury/illness/exacerbation are also affecting patient's functional outcome.   REHAB POTENTIAL: Good  CLINICAL DECISION MAKING: Stable/uncomplicated  EVALUATION COMPLEXITY: Low   GOALS: Goals reviewed with patient? No  SHORT TERM GOALS: Target date: 05/11/2024   Patient to demonstrate independence in HEP  Baseline: [email or web address] Goal status: Met  2.  Assess 2 MWT Baseline: TBD; 05/16/24 368ft w/o cane Goal status: Met   LONG TERM GOALS: Target date: 06/01/2024    Patient will increase 30s chair stand reps from 9 to 12 without arms to demonstrate and improved functional ability with less pain/difficulty as well as reduce fall risk.  Baseline: 9 06-01-24  8  with wt bear to the Right Goal status: ONGOING  2.  Patient will acknowledge 2/10 pain at least once during episode of care   Baseline: 4/10 06-01-24  2/10 but improving Goal status:ONGOING  3.  Patient will score at least 60/80 on LEFS to signify clinically meaningful improvement in functional abilities.   Baseline: 36/80 06-01-24  45/60 Goal status: ONGOING  4.  Increase L hip and knee strength to 4/5 in deficit regions Baseline: 4-/5 knee and hip abduction strength Goal status: ONGOING  5.  125d L knee flexion Baseline: 118d L knee flexion Goal status: ONGOING  6.   Patient to negotiate 16 steps with most appropriate pattern Baseline: TBD Goal status: ONGOING   PLAN:  PT FREQUENCY: 2x/week  PT DURATION: 4 weeks  PLANNED INTERVENTIONS: 97110-Therapeutic exercises, 97530- Therapeutic activity, 97112- Neuromuscular re-education, 97535- Self Care, [id number]- Manual therapy, [phone]- Gait training, [phone]- Aquatic Therapy, Patient/Family education, Balance training, and Stair training  PLAN FOR NEXT SESSION: HEP review and update, manual techniques as appropriate, aerobic tasks, ROM and flexibility activities, strengthening and PREs, TPDN, gait and balance training as needed     Corean Pouch PTA  06/17/24 2:57 PM Phone: [phone] Fax: [phone]

## 2024-06-17 NOTE — Therapy (Signed)
 OUTPATIENT PHYSICAL THERAPY TREATMENT NOTE       Patient Name: Veronica Barrett MRN: [id number] DOB:1951/04/29, 73 y.o., female Today's Date: 06/17/2024  END OF SESSION:    Past Medical History:  Diagnosis Date   Allergy    seasonal allergy   Arthritis    left hip   Asthma    Depression    HSV infection    vag   Past Surgical History:  Procedure Laterality Date   APPENDECTOMY  11/17/1982   EYE SURGERY Left    left vitreous   LEG SURGERY Left 01/10/2024   TOTAL HIP ARTHROPLASTY Left 10/17/2014   Procedure: LEFT TOTAL HIP ARTHROPLASTY ANTERIOR APPROACH;  Surgeon: Donnice JONETTA Car, MD;  Location: [facility];  Service: Orthopedics;  Laterality: Left;   Patient Active Problem List   Diagnosis Date Noted   Osteoporosis without current pathological fracture 10/12/2023   Moderate persistent asthma 08/10/2023   Bronchitis 06/18/2021   Leukocytosis 06/17/2021   Environmental and seasonal allergies 10/07/2018   Chronic sinusitis 10/07/2018   H/O sinus surgery 10/07/2018   Hyperlipidemia 10/07/2018   S/P left THA, AA 10/17/2014   Recurrent HSV (herpes simplex virus) 04/21/2013   Depression, recurrent (HCC) 02/20/2010   Allergic rhinitis 04/16/2007    PCP: Veronica Barrett   REFERRING PROVIDER: Chandra Toribio POUR, MD  REFERRING DIAG: [phone] (ICD-10-CM) - Closed fracture of left femur with routine healing, unspecified fracture morphology, unspecified portion of femur, subsequent encounter  THERAPY DIAG:  No diagnosis found.  Rationale for Evaluation and Treatment: Rehabilitation  ONSET DATE: 01/10/24 DOS  SUBJECTIVE:   SUBJECTIVE STATEMENT:  Arrives to aquatics with 2/10 pain today.  Ready to go over aquatic HEP Pt reports purchasing a step to use in kitchen for step ups for continuing strength at home.  PERTINENT HISTORY: HPI: Veronica Barrett 73 y.o. female with past medical history of moderate persistent asthma, hyperlipidemia and depression who presented to the emergency  department after a mechanical fall. Patient's fall led to direct strike of her left knee to the ground. Patient noted severe pain and inability to ambulate at that time. Imaging revealed a left distal femoral shaft fracture   Patient was hospitalized for evaluation and management of left distal 3rd femur shaft fracture. Orthopedic surgery evaluated in consultation and she underwent left femur ORIF on 01/10/2024. She has been evaluated by PT/OT postoperatively with recommendations for acute rehab. Pt is medically stable and has been evaluated and found to be appropriate for [facility] and admitted.  PAIN:  Are you having pain? Yes: NPRS scale: 3/10 Pain location: L thigh Pain description: ache Aggravating factors: stairs, prolonged activity Relieving factors: rest   PRECAUTIONS: Fall  RED FLAGS: None   WEIGHT BEARING RESTRICTIONS: No  FALLS:  Has patient fallen in last 6 months? No  OCCUPATION: retired  PLOF: Independent with household mobility with device  PATIENT GOALS: To manage my symptoms and wean of the cane  NEXT MD VISIT: 05/05/23  OBJECTIVE:  Note: Objective measures were completed at Evaluation unless otherwise noted.  DIAGNOSTIC FINDINGS: none available  PATIENT SURVEYS:  LEFS  06-01-24 LEFS 45/80 56.3% MUSCLE LENGTH: Hamstrings: WFL Thomas test: No restriction  POSTURE: No Significant postural limitations  PALPATION: negative  LOWER EXTREMITY ROM:  Active ROM Right eval Left eval  Hip flexion    Hip extension    Hip abduction    Hip adduction    Hip internal rotation    Hip external rotation    Knee flexion  125d 118d  Knee extension 0d 0d  Ankle dorsiflexion    Ankle plantarflexion    Ankle inversion    Ankle eversion     (Blank rows = not tested)  LOWER EXTREMITY MMT:  MMT Right eval Left eval  Hip flexion    Hip extension    Hip abduction  4-  Hip adduction    Hip internal rotation    Hip external rotation    Knee flexion  4-  Knee  extension  4-  Ankle dorsiflexion    Ankle plantarflexion    Ankle inversion    Ankle eversion     (Blank rows = not tested)  LOWER EXTREMITY SPECIAL TESTS:  deferred  FUNCTIONAL TESTS:  30 seconds chair stand test 9 reps w/o Ues   GAIT: Distance walked: 92ftx2 Assistive device utilized: Single point cane Level of assistance: Complete Independence Comments: unremarkable                                                                                                                                TREATMENT:  [facility] Adult PT Treatment:                                                DATE: 06/15/24 Therapeutic Exercise: Nustep L7 8 min  Neuromuscular re-ed: Cable walk 13# 5x 4-way Hip hike 5x B Therapeutic Activity: L hip flexor stretch 30s x2 Supine hip fallouts BlaTB 10x B, 10/10 unilaterally Bridge against BlaTB 10x B clams S/L 10/10 BlaTB  [facility] Adult PT Treatment:                                                DATE: 06-08-24 Pt enters building ambulating independently with SPC. Treatment took place in water 3.8 to  4 ft 8 in. deep depending upon activity.  Pt entered and exited the pool via stair and handrails independently. Temperature 92 degrees. Initial pain 2/10 in left hip. Access Code: [email or web address] URL: [email or web address] Prepared by: Graydon Dingwall  Exercises - Standing Hip Abduction Adduction at [facility]  - Hip Swing   - Farmer's Walk   - Step Up   - Sit to Stand Without Arm Support   - Standing 'L' Stretch at Counter   - Side stepping with or without Hand Floats   - High Knee Balance with Forward Walking   - High Knee Balance with Backward Walking   - Standing Balance on Noodle at [employer]   - Tandem Balance on Noodle at [employer] - Squat   - Standing Knee Flexion   - Standing Hip Extension Stomps with Pool Noodle  - 3 -way Leg Stretch with Pool  Noodle  - Heel Toe Raises at [facility]  - Standing Piriformis Release in Figure 4 Position  with Ball at Wall  Pt requires the buoyancy of water for active assisted exercises with buoyancy supported for strengthening and AROM exercises. Hydrostatic pressure also supports joints by unweighting joint load by at least 50 % in 3-4 feet depth water. 80% in chest to neck deep water. Water will provide assistance with movement using the current and laminar flow while the buoyancy reduces weight bearing. Pt requires the viscosity of the water for resistance with strengthening exercises.   [facility] Adult PT Treatment:                                                DATE: 06/06/24 Therapeutic Exercise: Nustep L6 8 min Neuromuscular re-ed: Cable walk 10# 5x 4-way Hip hike 5x B Therapeutic Activity: Supine hip fallouts BluTB 15x B, 15/15 unilaterally Bridge against BluTB 15x B clams S/L 15/15 BluTB  [facility] Adult PT Treatment:                                                DATE: 06-01-24 Pt enters building ambulating independently with SPC. Treatment took place in water 3.8 to  4 ft 8 in. deep depending upon activity.  Pt entered and exited the pool via stair and handrails independently. Temperature 92 degrees. Initial pain 3/10 in left hip. 2/10 over all soreness LEFS  45/80   Aquatic HEP  Access Code: [email or web address]  in process   Aquatic Exercise: Standing with UE support edge of pool and using aquatic cuffs Standing Balance on Noodle at [employer] High Knee Balance with Forward Walking and Hand Floats   High Knee Balance with Backward Walking with Hand Floats   Hip Extension Stomps 3 way hip stretch with yellow noodle for IT band and adductor and hamstring Hip hinge to submerged bench with even wt bearing on bil LE with VC Hip abd/add  Hip ext/flex with knee straight  L back stretch Hamstring curl BIL Squats 2x15 Heel raises x20 Runners stretch bottom step x30 BIL Hamstring stretch bottom step x30 BIL Sit to Stand on 3rd submerged step with UE support   Pt requires the buoyancy  of water for active assisted exercises with buoyancy supported for strengthening and AROM exercises. Hydrostatic pressure also supports joints by unweighting joint load by at least 50 % in 3-4 feet depth water. 80% in chest to neck deep water. Water will provide assistance with movement using the current and laminar flow while the buoyancy reduces weight bearing. Pt requires the viscosity of the water for resistance with strengthening exercises.   [facility] Adult PT Treatment:                                                DATE: 05/30/24 Therapeutic Exercise: Nustep L6 8 min Neuromuscular re-ed: Runners step 4 in 15/15 5# KB Sidestepping against yellow band at counter 2 trips STS from airex pad arms crossed, no bracing observed. Therapeutic Activity: Supine hip fallouts BluTB 10x B, 10/10 unilaterally Bridge  against BluTB 10x B clams S/L 10/10 BluTB SL bridge 10/10    PATIENT EDUCATION:  Education details: Discussed eval findings, rehab rationale and POC and patient is in agreement  Person educated: Patient Education method: Explanation and Handouts Education comprehension: verbalized understanding and needs further education  HOME EXERCISE PROGRAM: Access Code: [email or web address] URL: [email or web address] Date: 04/20/2024 Prepared by: Reyes Kohut  Exercises - Supine Quad Set  - 2-3 x daily - 5 x weekly - 1 sets - 10 reps - 3s hold - Small Range Straight Leg Raise  - 2-3 x daily - 5 x weekly - 1 sets - 10 reps - Clamshell with Resistance  - 2-3 x daily - 5 x weekly - 1 sets - 10 reps Aquatic HEP Access Code: [email or web address] URL: [email or web address] Date: 06/07/2024 Prepared by: Graydon Dingwall   ASSESSMENT:  CLINICAL IMPRESSION:  Feels her gait is smoothing out and only reports mild soreness, no pain.  Increased resistance and challenge as noted to emphasize strength and balance.  (10th visit)Progress note today.  Pt showing improvement and comments she is  able to do more than she die at evaluation.  Session today focused on hip and core strengthening in the aquatic environment for use of buoyancy to offload joints and the viscosity of water as resistance during therapeutic exercise. Pt show consistently improved activity tolerance session to session. PT LEFS score 45/80 with improvement from eval.  30  STS 8 ( eval 9) with wt bear to Right and complaint of soreness impeding increased # of STS today but pt is able to perform exercises with confidence in water.   Patient was able to tolerate all prescribed exercises in the aquatic environment with no adverse effects and reports 3/10 pain initially and decreased to 0/10 at end of session.  Aquatic HEP being formulated for distribution next visit. Pt continues to have more dependence on R LE with transition motion showing decreased balance/ proprioception and left gluteal  weakness.  Patient continues to benefit from skilled PT services on land and aquatic based and should be progressed as able to improve functional independence  .     EVAL- Patient is a 73 y.o. female who was seen today for physical therapy evaluation and treatment for L LE weakness and dysfunction following fracture and ORIF 01/10/24.  Strength deficits note with 30s chair stand test, no leg length identified, extensor lag observed with SLR, L abduction strength limited.  Patient is a good candidate for OPPT to resolve strength, balance and ROM deficits and wean off cane when appropriate  OBJECTIVE IMPAIRMENTS: Abnormal gait, decreased activity tolerance, decreased balance, decreased endurance, decreased mobility, difficulty walking, decreased ROM, decreased strength, and pain.   ACTIVITY LIMITATIONS: carrying, lifting, standing, squatting, and stairs  PERSONAL FACTORS: Age, Past/current experiences, and Time since onset of injury/illness/exacerbation are also affecting patient's functional outcome.   REHAB POTENTIAL: Good  CLINICAL  DECISION MAKING: Stable/uncomplicated  EVALUATION COMPLEXITY: Low   GOALS: Goals reviewed with patient? No  SHORT TERM GOALS: Target date: 05/11/2024   Patient to demonstrate independence in HEP  Baseline: [email or web address] Goal status: Met  2.  Assess 2 MWT Baseline: TBD; 05/16/24 361ft w/o cane Goal status: Met   LONG TERM GOALS: Target date: 06/01/2024    Patient will increase 30s chair stand reps from 9 to 12 without arms to demonstrate and improved functional ability with less pain/difficulty as well as reduce fall risk.  Baseline: 9 06-01-24  8  with wt bear to the Right Goal  status: ONGOING  2.  Patient will acknowledge 2/10 pain at least once during episode of care   Baseline: 4/10 06-01-24  2/10 but improving Goal status:ONGOING  3.  Patient will score at least 60/80 on LEFS to signify clinically meaningful improvement in functional abilities.   Baseline: 36/80 06-01-24  45/60 Goal status: ONGOING  4.  Increase L hip and knee strength to 4/5 in deficit regions Baseline: 4-/5 knee and hip abduction strength Goal status: ONGOING  5.  125d L knee flexion Baseline: 118d L knee flexion Goal status: ONGOING  6.  Patient to negotiate 16 steps with most appropriate pattern Baseline: TBD Goal status: ONGOING   PLAN:  PT FREQUENCY: 2x/week  PT DURATION: 4 weeks  PLANNED INTERVENTIONS: 97110-Therapeutic exercises, 97530- Therapeutic activity, 97112- Neuromuscular re-education, 97535- Self Care, [id number]- Manual therapy, [phone]- Gait training, [phone]- Aquatic Therapy, Patient/Family education, Balance training, and Stair training  PLAN FOR NEXT SESSION: HEP review and update, manual techniques as appropriate, aerobic tasks, ROM and flexibility activities, strengthening and PREs, TPDN, gait and balance training as needed     Chyrl Kohut PT  06/17/24 8:18 AM Phone: [phone] Fax: [phone]

## 2024-06-20 ENCOUNTER — Ambulatory Visit

## 2024-06-20 DIAGNOSIS — M6281 Muscle weakness (generalized): Secondary | ICD-10-CM

## 2024-06-20 DIAGNOSIS — R2681 Unsteadiness on feet: Secondary | ICD-10-CM

## 2024-06-20 DIAGNOSIS — R2689 Other abnormalities of gait and mobility: Secondary | ICD-10-CM

## 2024-06-20 NOTE — Therapy (Signed)
 OUTPATIENT PHYSICAL THERAPY TREATMENT NOTE   Patient Name: Veronica Barrett MRN: [id number] DOB:[birth year], 73 y.o., female Today's Date: 06/20/2024  END OF SESSION:  PT End of Session - 06/20/24 1057     Visit Number 15    Number of Visits 20    Date for PT Re-Evaluation 06/20/24    Authorization Type MCR    Progress Note Due on Visit 20    PT Start Time 1100   late for appt.   PT Stop Time 1130    PT Time Calculation (min) 30 min    Activity Tolerance Patient tolerated treatment well    Behavior During Therapy WFL for tasks assessed/performed           Past Medical History:  Diagnosis Date   Allergy    seasonal allergy   Arthritis    left hip   Asthma    Depression    HSV infection    vag   Past Surgical History:  Procedure Laterality Date   APPENDECTOMY  11/17/1982   EYE SURGERY Left    left vitreous   LEG SURGERY Left 01/10/2024   TOTAL HIP ARTHROPLASTY Left 10/17/2014   Procedure: LEFT TOTAL HIP ARTHROPLASTY ANTERIOR APPROACH;  Surgeon: Veronica JONETTA Car, MD;  Location: [facility];  Service: Orthopedics;  Laterality: Left;   Patient Active Problem List   Diagnosis Date Noted   Osteoporosis without current pathological fracture 10/12/2023   Moderate persistent asthma 08/10/2023   Bronchitis 06/18/2021   Leukocytosis 06/17/2021   Environmental and seasonal allergies 10/07/2018   Chronic sinusitis 10/07/2018   H/O sinus surgery 10/07/2018   Hyperlipidemia 10/07/2018   S/P left THA, AA 10/17/2014   Recurrent HSV (herpes simplex virus) 04/21/2013   Depression, recurrent (HCC) 02/20/2010   Allergic rhinitis 04/16/2007    PCP: Veronica Barrett LABOR, PA   REFERRING PROVIDER: Chandra Toribio POUR, MD  REFERRING DIAG: [phone] (ICD-10-CM) - Closed fracture of left femur with routine healing, unspecified fracture morphology, unspecified portion of femur, subsequent encounter  THERAPY DIAG:  Muscle weakness (generalized)  Other abnormalities of gait and  mobility  Unsteadiness on feet  Rationale for Evaluation and Treatment: Rehabilitation  ONSET DATE: 01/10/24 DOS  SUBJECTIVE:   SUBJECTIVE STATEMENT:  1-2/10 level of discomfort reported.    PERTINENT HISTORY: HPI: Veronica Barrett 73 y.o. female with past medical history of moderate persistent asthma, hyperlipidemia and depression who presented to the emergency department after a mechanical fall. Patient's fall led to direct strike of her left knee to the ground. Patient noted severe pain and inability to ambulate at that time. Imaging revealed a left distal femoral shaft fracture   Patient was hospitalized for evaluation and management of left distal 3rd femur shaft fracture. Orthopedic surgery evaluated in consultation and she underwent left femur ORIF on 01/10/2024. She has been evaluated by PT/OT postoperatively with recommendations for acute rehab. Pt is medically stable and has been evaluated and found to be appropriate for [facility] and admitted.  PAIN:  Are you having pain? Yes: NPRS scale: 3/10 Pain location: L thigh Pain description: ache Aggravating factors: stairs, prolonged activity Relieving factors: rest   PRECAUTIONS: Fall  RED FLAGS: None   WEIGHT BEARING RESTRICTIONS: No  FALLS:  Has patient fallen in last 6 months? No  OCCUPATION: retired  PLOF: Independent with household mobility with device  PATIENT GOALS: To manage my symptoms and wean of the cane  NEXT MD VISIT: None scheduled at this time  OBJECTIVE:  Note: Objective measures  were completed at Evaluation unless otherwise noted.  DIAGNOSTIC FINDINGS: none available  PATIENT SURVEYS:  LEFS  06-01-24 LEFS 45/80 56.3% 06/20/24 46 58% perceived function MUSCLE LENGTH: Hamstrings: WFL Thomas test: No restriction  POSTURE: No Significant postural limitations  PALPATION: negative  LOWER EXTREMITY ROM:  Active ROM Right eval Left eval 06/20/24 L  Hip flexion   132d  Hip extension     Hip abduction      Hip adduction     Hip internal rotation     Hip external rotation     Knee flexion 125d 118d   Knee extension 0d 0d   Ankle dorsiflexion     Ankle plantarflexion     Ankle inversion     Ankle eversion      (Blank rows = not tested)  LOWER EXTREMITY MMT:  MMT Right eval Left eval  Hip flexion    Hip extension    Hip abduction  4-  Hip adduction    Hip internal rotation    Hip external rotation    Knee flexion  4-  Knee extension  4-  Ankle dorsiflexion    Ankle plantarflexion    Ankle inversion    Ankle eversion     (Blank rows = not tested)  LOWER EXTREMITY SPECIAL TESTS:  deferred  FUNCTIONAL TESTS:  30 seconds chair stand test 9 reps w/o Ues; 06/20/24 10 reps  GAIT: Distance walked: 23ftx2 Assistive device utilized: Single point cane Level of assistance: Complete Independence Comments: unremarkable                                                                                                                                TREATMENT:  [facility] Adult PT Treatment:                                                DATE: 06/20/24 Therapeutic Exercise: Nustep L7 5 min STS from airex w/5000g ball 10x L SKTC 132d  Therapeutic Activity: 16 steps with B rails and step through pattern LEFS retake 30s STS 10 reps Supine hip fallouts BlaTB 10x B, 10/10 unilaterally Bridge against BlaTB 10x B clams S/L 10/10 BlaTB  [facility] Adult PT Treatment:                                                DATE: 06/17/24 Pt enters building ambulating independently with SPC. Treatment took place in water 3.8 to  4 ft 8 in. deep depending upon activity.  Pt entered and exited the pool via stair and handrails independently. Temperature 92 degrees.  Exercises Walking forwards/backwards/sidestepping x2 laps ea Standing Hip Abduction Adduction at [employer]  Heel/toe raises  x20 Hip Swing x20 BIL Farmer's Walk yellow DB x1 lap Suitcase carry yellow DB x2 laps Step Up x10 BIL Sit to Stand from 3rd  step from bottom no UE x10 Standing 'L' Stretch at pool wall x10 hold x10 Side stepping with yellow DB shoulder abd/add x2 laps High Knee Balance with Forward Walking (NT) High Knee Balance with Backward Walking (NT) Standing Balance on Noodle at [employer] 2x20 Standing hip flexion to knee extension 2x10 BIL Standing Hip Extension Stomps with Pool Noodle x15 BIL Standing Piriformis Release in Figure 4 Position x30 BIL  Pt requires the buoyancy of water for active assisted exercises with buoyancy supported for strengthening and AROM exercises. Hydrostatic pressure also supports joints by unweighting joint load by at least 50 % in 3-4 feet depth water. 80% in chest to neck deep water. Water will provide assistance with movement using the current and laminar flow while the buoyancy reduces weight bearing. Pt requires the viscosity of the water for resistance with strengthening exercises.  [facility] Adult PT Treatment:                                                DATE: 06/15/24 Therapeutic Exercise: Nustep L7 8 min  Neuromuscular re-ed: Cable walk 13# 5x 4-way Hip hike 5x B Therapeutic Activity: L hip flexor stretch 30s x2 Supine hip fallouts BlaTB 10x B, 10/10 unilaterally Bridge against BlaTB 10x B clams S/L 10/10 BlaTB  [facility] Adult PT Treatment:                                                DATE: 06-08-24 Pt enters building ambulating independently with SPC. Treatment took place in water 3.8 to  4 ft 8 in. deep depending upon activity.  Pt entered and exited the pool via stair and handrails independently. Temperature 92 degrees. Initial pain 2/10 in left hip. Access Code: [email or web address] URL: [email or web address] Prepared by: Graydon Dingwall  Exercises - Standing Hip Abduction Adduction at [facility]  - Hip Swing   - Farmer's Walk   - Step Up   - Sit to Stand Without Arm Support   - Standing 'L' Stretch at Counter   - Side stepping with or without Hand Floats   -  High Knee Balance with Forward Walking   - High Knee Balance with Backward Walking   - Standing Balance on Noodle at [employer]   - Tandem Balance on Noodle at [employer] - Squat   - Standing Knee Flexion   - Standing Hip Extension Stomps with Pool Noodle  - 3 -way Leg Stretch with Pool Noodle  - Heel Toe Raises at [employer]  - Standing Piriformis Release in Figure 4 Position with Ball at Wall  Pt requires the buoyancy of water for active assisted exercises with buoyancy supported for strengthening and AROM exercises. Hydrostatic pressure also supports joints by unweighting joint load by at least 50 % in 3-4 feet depth water. 80% in chest to neck deep water. Water will provide assistance with movement using the current and laminar flow while the buoyancy reduces weight bearing. Pt requires the viscosity of the water for resistance with strengthening exercises.    PATIENT EDUCATION:  Education details: Discussed eval findings, rehab rationale and POC and patient is in agreement  Person educated: Patient Education method: Explanation and Handouts Education comprehension: verbalized understanding and needs further education  HOME EXERCISE PROGRAM: Access Code: [email or web address] URL: [email or web address] Date: 04/20/2024 Prepared by: Reyes Kohut  Exercises - Supine Quad Set  - 2-3 x daily - 5 x weekly - 1 sets - 10 reps - 3s hold - Small Range Straight Leg Raise  - 2-3 x daily - 5 x weekly - 1 sets - 10 reps - Clamshell with Resistance  - 2-3 x daily - 5 x weekly - 1 sets - 10 reps Aquatic HEP Access Code: [email or web address] URL: [email or web address] Date: 06/07/2024 Prepared by: Graydon Dingwall   ASSESSMENT:  CLINICAL IMPRESSION:  Pain/soreness goal met.  Reported gains in function and gait with increased LEFS score noted.  Still shows L abduction weakness as noted with stair negotiation.    EVAL- Patient is a 73 y.o. female who was seen today for physical therapy  evaluation and treatment for L LE weakness and dysfunction following fracture and ORIF 01/10/24.  Strength deficits note with 30s chair stand test, no leg length identified, extensor lag observed with SLR, L abduction strength limited.  Patient is a good candidate for OPPT to resolve strength, balance and ROM deficits and wean off cane when appropriate  OBJECTIVE IMPAIRMENTS: Abnormal gait, decreased activity tolerance, decreased balance, decreased endurance, decreased mobility, difficulty walking, decreased ROM, decreased strength, and pain.   ACTIVITY LIMITATIONS: carrying, lifting, standing, squatting, and stairs  PERSONAL FACTORS: Age, Past/current experiences, and Time since onset of injury/illness/exacerbation are also affecting patient's functional outcome.   REHAB POTENTIAL: Good  CLINICAL DECISION MAKING: Stable/uncomplicated  EVALUATION COMPLEXITY: Low   GOALS: Goals reviewed with patient? No  SHORT TERM GOALS: Target date: 05/11/2024   Patient to demonstrate independence in HEP  Baseline: [email or web address] Goal status: Met  2.  Assess 2 MWT Baseline: TBD; 05/16/24 320ft w/o cane Goal status: Met   LONG TERM GOALS: Target date: 06/01/2024    Patient will increase 30s chair stand reps from 9 to 12 without arms to demonstrate and improved functional ability with less pain/difficulty as well as reduce fall risk.  Baseline: 9 06-01-24  8  with wt bear to the Right 06/20/24 10 Goal status: ONGOING  2.  Patient will acknowledge 2/10 pain at least once during episode of care   Baseline: 4/10 06-01-24  2/10 but improving 06/20/24 1-2/10 Goal status: Met  3.  Patient will score at least 60/80 on LEFS to signify clinically meaningful improvement in functional abilities.   Baseline: 36/80 06-01-24  45/60 06/20/24 46/80 Goal status: ONGOING  4.  Increase L hip and knee strength to 4/5 in deficit regions Baseline: 4-/5 knee and hip abduction strength Goal status: ONGOING  5.  125d L knee  flexion Baseline: 118d L knee flexion Goal status: ONGOING  6.  Patient to negotiate 16 steps with most appropriate pattern Baseline: TBD 06/20/24 16 steps with B rails and step through pattern Goal status: ONGOING   PLAN:  PT FREQUENCY: 2x/week  PT DURATION: 4 weeks  PLANNED INTERVENTIONS: 97110-Therapeutic exercises, 97530- Therapeutic activity, 97112- Neuromuscular re-education, 97535- Self Care, [id number]- Manual therapy, [phone]- Gait training, [phone]- Aquatic Therapy, Patient/Family education, Balance training, and Stair training  PLAN FOR NEXT SESSION: HEP review and update, manual techniques as appropriate, aerobic tasks, ROM and flexibility activities, strengthening and PREs, TPDN, gait and balance training as needed     Reyes HERO  Aracelly Tencza PT  06/20/24 12:23 PM Phone: [phone] Fax: [phone]

## 2024-06-23 ENCOUNTER — Ambulatory Visit

## 2024-06-23 DIAGNOSIS — R2689 Other abnormalities of gait and mobility: Secondary | ICD-10-CM

## 2024-06-23 DIAGNOSIS — M6281 Muscle weakness (generalized): Secondary | ICD-10-CM | POA: Diagnosis not present

## 2024-06-23 DIAGNOSIS — R2681 Unsteadiness on feet: Secondary | ICD-10-CM

## 2024-06-23 NOTE — Therapy (Signed)
 OUTPATIENT PHYSICAL THERAPY TREATMENT NOTE   Patient Name: Veronica Barrett MRN: [id number] DOB:[birth year], 73 y.o., female Today's Date: 06/23/2024  END OF SESSION:  PT End of Session - 06/23/24 1400     Visit Number 16    Number of Visits 20    Date for PT Re-Evaluation 08/21/24    Authorization Type MCR    Progress Note Due on Visit 20    PT Start Time 1400    PT Stop Time 1440    PT Time Calculation (min) 40 min    Activity Tolerance Patient tolerated treatment well    Behavior During Therapy [facility] for tasks assessed/performed          Past Medical History:  Diagnosis Date   Allergy    seasonal allergy   Arthritis    left hip   Asthma    Depression    HSV infection    vag   Past Surgical History:  Procedure Laterality Date   APPENDECTOMY  11/17/1982   EYE SURGERY Left    left vitreous   LEG SURGERY Left 01/10/2024   TOTAL HIP ARTHROPLASTY Left 10/17/2014   Procedure: LEFT TOTAL HIP ARTHROPLASTY ANTERIOR APPROACH;  Surgeon: Veronica JONETTA Car, MD;  Location: [facility];  Service: Orthopedics;  Laterality: Left;   Patient Active Problem List   Diagnosis Date Noted   Osteoporosis without current pathological fracture 10/12/2023   Moderate persistent asthma 08/10/2023   Bronchitis 06/18/2021   Leukocytosis 06/17/2021   Environmental and seasonal allergies 10/07/2018   Chronic sinusitis 10/07/2018   H/O sinus surgery 10/07/2018   Hyperlipidemia 10/07/2018   S/P left THA, AA 10/17/2014   Recurrent HSV (herpes simplex virus) 04/21/2013   Depression, recurrent (HCC) 02/20/2010   Allergic rhinitis 04/16/2007    PCP: Veronica Joesph LABOR, PA   REFERRING PROVIDER: Chandra Toribio POUR, MD  REFERRING DIAG: [phone] (ICD-10-CM) - Closed fracture of left femur with routine healing, unspecified fracture morphology, unspecified portion of femur, subsequent encounter  THERAPY DIAG:  Muscle weakness (generalized)  Other abnormalities of gait and mobility  Unsteadiness on  feet  Rationale for Evaluation and Treatment: Rehabilitation  ONSET DATE: 01/10/24 DOS  SUBJECTIVE:   SUBJECTIVE STATEMENT:  Patient reports that she feels mostly stiff today, states the land session this week was challenging.   PERTINENT HISTORY: HPI: Veronica Barrett 73 y.o. female with past medical history of moderate persistent asthma, hyperlipidemia and depression who presented to the emergency department after a mechanical fall. Patient's fall led to direct strike of her left knee to the ground. Patient noted severe pain and inability to ambulate at that time. Imaging revealed a left distal femoral shaft fracture   Patient was hospitalized for evaluation and management of left distal 3rd femur shaft fracture. Orthopedic surgery evaluated in consultation and she underwent left femur ORIF on 01/10/2024. She has been evaluated by PT/OT postoperatively with recommendations for acute rehab. Pt is medically stable and has been evaluated and found to be appropriate for [facility] and admitted.  PAIN:  Are you having pain? Yes: NPRS scale: 3/10 Pain location: L thigh Pain description: ache Aggravating factors: stairs, prolonged activity Relieving factors: rest   PRECAUTIONS: Fall  RED FLAGS: None   WEIGHT BEARING RESTRICTIONS: No  FALLS:  Has patient fallen in last 6 months? No  OCCUPATION: retired  PLOF: Independent with household mobility with device  PATIENT GOALS: To manage my symptoms and wean of the cane  NEXT MD VISIT: None scheduled at this time  OBJECTIVE:  Note: Objective measures were completed at Evaluation unless otherwise noted.  DIAGNOSTIC FINDINGS: none available  PATIENT SURVEYS:  LEFS  06-01-24 LEFS 45/80 56.3% 06/20/24 46 58% perceived function MUSCLE LENGTH: Hamstrings: WFL Thomas test: No restriction  POSTURE: No Significant postural limitations  PALPATION: negative  LOWER EXTREMITY ROM:  Active ROM Right eval Left eval 06/20/24 L  Hip flexion   132d   Hip extension     Hip abduction     Hip adduction     Hip internal rotation     Hip external rotation     Knee flexion 125d 118d   Knee extension 0d 0d   Ankle dorsiflexion     Ankle plantarflexion     Ankle inversion     Ankle eversion      (Blank rows = not tested)  LOWER EXTREMITY MMT:  MMT Right eval Left eval  Hip flexion    Hip extension    Hip abduction  4-  Hip adduction    Hip internal rotation    Hip external rotation    Knee flexion  4-  Knee extension  4-  Ankle dorsiflexion    Ankle plantarflexion    Ankle inversion    Ankle eversion     (Blank rows = not tested)  LOWER EXTREMITY SPECIAL TESTS:  deferred  FUNCTIONAL TESTS:  30 seconds chair stand test 9 reps w/o Ues; 06/20/24 10 reps  GAIT: Distance walked: 40ftx2 Assistive device utilized: Single point cane Level of assistance: Complete Independence Comments: unremarkable                                                                                                                                TREATMENT:  [facility] Adult PT Treatment:                                                DATE: 06/23/24 Pt enters building ambulating independently with SPC. Treatment took place in water 3.8 to  4 ft 8 in. deep depending upon activity.  Pt entered and exited the pool via stair and handrails independently. Temperature 92 degrees.  Exercises Walking forwards/backwards/sidestepping x2 laps ea Standing Hip Abduction Adduction at Pool Wall x20 BIL Heel/toe raises x20 Hip Swing x20 BIL Farmer's Walk yellow DB x1 lap Sit to Stand from 3rd step from bottom no UE x10 Standing 'L' Stretch at pool wall x10 hold x10 Side stepping with yellow DB shoulder abd/add x2 laps High Knee Balance with Forward Walking x2 laps High Knee Balance with Backward Walking x 2 laps Standing Balance on Noodle at [employer] 2x20 Standing Hip Extension Stomps with Pool Noodle x20 BIL  Pt requires the buoyancy of water for  active assisted exercises with buoyancy supported for strengthening and AROM exercises. Hydrostatic  pressure also supports joints by unweighting joint load by at least 50 % in 3-4 feet depth water. 80% in chest to neck deep water. Water will provide assistance with movement using the current and laminar flow while the buoyancy reduces weight bearing. Pt requires the viscosity of the water for resistance with strengthening exercises.  [facility] Adult PT Treatment:                                                DATE: 06/20/24 Therapeutic Exercise: Nustep L7 5 min STS from airex w/5000g ball 10x L SKTC 132d  Therapeutic Activity: 16 steps with B rails and step through pattern LEFS retake 30s STS 10 reps Supine hip fallouts BlaTB 10x B, 10/10 unilaterally Bridge against BlaTB 10x B clams S/L 10/10 BlaTB  [facility] Adult PT Treatment:                                                DATE: 06/17/24 Pt enters building ambulating independently with SPC. Treatment took place in water 3.8 to  4 ft 8 in. deep depending upon activity.  Pt entered and exited the pool via stair and handrails independently. Temperature 92 degrees.  Exercises Walking forwards/backwards/sidestepping x2 laps ea Standing Hip Abduction Adduction at [employer]  Heel/toe raises x20 Hip Swing x20 BIL Farmer's Walk yellow DB x1 lap Suitcase carry yellow DB x2 laps Step Up x10 BIL Sit to Stand from 3rd step from bottom no UE x10 Standing 'L' Stretch at pool wall x10 hold x10 Side stepping with yellow DB shoulder abd/add x2 laps High Knee Balance with Forward Walking (NT) High Knee Balance with Backward Walking (NT) Standing Balance on Noodle at [employer] 2x20 Standing hip flexion to knee extension 2x10 BIL Standing Hip Extension Stomps with Pool Noodle x15 BIL Standing Piriformis Release in Figure 4 Position x30 BIL  Pt requires the buoyancy of water for active assisted exercises with buoyancy supported for strengthening and  AROM exercises. Hydrostatic pressure also supports joints by unweighting joint load by at least 50 % in 3-4 feet depth water. 80% in chest to neck deep water. Water will provide assistance with movement using the current and laminar flow while the buoyancy reduces weight bearing. Pt requires the viscosity of the water for resistance with strengthening exercises.    PATIENT EDUCATION:  Education details: Discussed eval findings, rehab rationale and POC and patient is in agreement  Person educated: Patient Education method: Explanation and Handouts Education comprehension: verbalized understanding and needs further education  HOME EXERCISE PROGRAM: Access Code: [email or web address] URL: [email or web address] Date: 04/20/2024 Prepared by: Reyes Kohut  Exercises - Supine Quad Set  - 2-3 x daily - 5 x weekly - 1 sets - 10 reps - 3s hold - Small Range Straight Leg Raise  - 2-3 x daily - 5 x weekly - 1 sets - 10 reps - Clamshell with Resistance  - 2-3 x daily - 5 x weekly - 1 sets - 10 reps Aquatic HEP Access Code: [email or web address] URL: [email or web address] Date: 06/07/2024 Prepared by: Graydon Dingwall   ASSESSMENT:  CLINICAL IMPRESSION:   Patient presents to PT reporting increased stiffness today, states that the last land session was challenging. Session today  focused on LE strengthening, particularly with hip abduction, and ambulation in the aquatic environment for use of buoyancy to offload joints and the viscosity of water as resistance during therapeutic exercise. Patient was able to tolerate all prescribed exercises in the aquatic environment with no adverse effects. Patient continues to benefit from skilled PT services on land and aquatic based and should be progressed as able to improve functional independence.  EVAL- Patient is a 73 y.o. female who was seen today for physical therapy evaluation and treatment for L LE weakness and dysfunction following fracture and ORIF  01/10/24.  Strength deficits note with 30s chair stand test, no leg length identified, extensor lag observed with SLR, L abduction strength limited.  Patient is a good candidate for OPPT to resolve strength, balance and ROM deficits and wean off cane when appropriate  OBJECTIVE IMPAIRMENTS: Abnormal gait, decreased activity tolerance, decreased balance, decreased endurance, decreased mobility, difficulty walking, decreased ROM, decreased strength, and pain.   ACTIVITY LIMITATIONS: carrying, lifting, standing, squatting, and stairs  PERSONAL FACTORS: Age, Past/current experiences, and Time since onset of injury/illness/exacerbation are also affecting patient's functional outcome.   REHAB POTENTIAL: Good  CLINICAL DECISION MAKING: Stable/uncomplicated  EVALUATION COMPLEXITY: Low   GOALS: Goals reviewed with patient? No  SHORT TERM GOALS: Target date: 05/11/2024   Patient to demonstrate independence in HEP  Baseline: [email or web address] Goal status: Met  2.  Assess 2 MWT Baseline: TBD; 05/16/24 336ft w/o cane Goal status: Met   LONG TERM GOALS: Target date: 06/01/2024    Patient will increase 30s chair stand reps from 9 to 12 without arms to demonstrate and improved functional ability with less pain/difficulty as well as reduce fall risk.  Baseline: 9 06-01-24  8  with wt bear to the Right 06/20/24 10 Goal status: ONGOING  2.  Patient will acknowledge 2/10 pain at least once during episode of care   Baseline: 4/10 06-01-24  2/10 but improving 06/20/24 1-2/10 Goal status: Met  3.  Patient will score at least 60/80 on LEFS to signify clinically meaningful improvement in functional abilities.   Baseline: 36/80 06-01-24  45/60 06/20/24 46/80 Goal status: ONGOING  4.  Increase L hip and knee strength to 4/5 in deficit regions Baseline: 4-/5 knee and hip abduction strength Goal status: ONGOING  5.  125d L knee flexion Baseline: 118d L knee flexion Goal status: ONGOING  6.  Patient to  negotiate 16 steps with most appropriate pattern Baseline: TBD 06/20/24 16 steps with B rails and step through pattern Goal status: ONGOING   PLAN:  PT FREQUENCY: 2x/week  PT DURATION: 4 weeks  PLANNED INTERVENTIONS: 97110-Therapeutic exercises, 97530- Therapeutic activity, 97112- Neuromuscular re-education, 97535- Self Care, [id number]- Manual therapy, [phone]- Gait training, [phone]- Aquatic Therapy, Patient/Family education, Balance training, and Stair training  PLAN FOR NEXT SESSION: HEP review and update, manual techniques as appropriate, aerobic tasks, ROM and flexibility activities, strengthening and PREs, TPDN, gait and balance training as needed     Corean Pouch PTA  06/23/24 2:40 PM Phone: [phone] Fax: [phone]

## 2024-06-23 NOTE — Therapy (Signed)
 OUTPATIENT PHYSICAL THERAPY TREATMENT NOTE   Patient Name: Veronica Barrett MRN: [id number] DOB:06/17/1951, 73 y.o., female Today's Date: 06/27/2024  END OF SESSION:  PT End of Session - 06/27/24 1003     Visit Number 17    Number of Visits 20    Date for PT Re-Evaluation 08/21/24    Authorization Type MCR    Progress Note Due on Visit 20    PT Start Time 1000    PT Stop Time 1040    PT Time Calculation (min) 40 min    Activity Tolerance Patient tolerated treatment well    Behavior During Therapy [facility] for tasks assessed/performed           Past Medical History:  Diagnosis Date   Allergy    seasonal allergy   Arthritis    left hip   Asthma    Depression    HSV infection    vag   Past Surgical History:  Procedure Laterality Date   APPENDECTOMY  11/17/1982   EYE SURGERY Left    left vitreous   LEG SURGERY Left 01/10/2024   TOTAL HIP ARTHROPLASTY Left 10/17/2014   Procedure: LEFT TOTAL HIP ARTHROPLASTY ANTERIOR APPROACH;  Surgeon: Veronica Barrett;  Location: [facility];  Service: Orthopedics;  Laterality: Left;   Patient Active Problem List   Diagnosis Date Noted   Osteoporosis without current pathological fracture 10/12/2023   Moderate persistent asthma 08/10/2023   Bronchitis 06/18/2021   Leukocytosis 06/17/2021   Environmental and seasonal allergies 10/07/2018   Chronic sinusitis 10/07/2018   H/O sinus surgery 10/07/2018   Hyperlipidemia 10/07/2018   S/P left THA, AA 10/17/2014   Recurrent HSV (herpes simplex virus) 04/21/2013   Depression, recurrent (HCC) 02/20/2010   Allergic rhinitis 04/16/2007    PCP: Veronica Barrett   REFERRING PROVIDER: Chandra Toribio POUR, Barrett  REFERRING DIAG: [phone] (ICD-10-CM) - Closed fracture of left femur with routine healing, unspecified fracture morphology, unspecified portion of femur, subsequent encounter  THERAPY DIAG:  Muscle weakness (generalized)  Other abnormalities of gait and mobility  Unsteadiness on  feet  Rationale for Evaluation and Treatment: Rehabilitation  ONSET DATE: 01/10/24 DOS  SUBJECTIVE:   SUBJECTIVE STATEMENT: Arrives with elevated levels of discomfort to lateral aspect of thigh as opposed to hip/knee respectively.  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 female with past medical history of moderate persistent asthma, hyperlipidemia and depression who presented to the emergency department after a mechanical fall. Patient's fall led to direct strike of her left knee to the ground. Patient noted severe pain and inability to ambulate at that time. Imaging revealed a left distal femoral shaft fracture   Patient was hospitalized for evaluation and management of left distal 3rd femur shaft fracture. Orthopedic surgery evaluated in consultation and she underwent left femur ORIF on 01/10/2024. She has been evaluated by PT/OT postoperatively with recommendations for acute rehab. Pt is medically stable and has been evaluated and found to be appropriate for [facility] and admitted.  PAIN:  Are you having pain? Yes: NPRS scale: 3/10 Pain location: L thigh Pain description: ache Aggravating factors: stairs, prolonged activity Relieving factors: rest   PRECAUTIONS: Fall  RED FLAGS: None   WEIGHT BEARING RESTRICTIONS: No  FALLS:  Has patient fallen in last 6 months? No  OCCUPATION: retired  PLOF: Independent with household mobility with device  PATIENT GOALS: To manage my symptoms  and wean of the cane  NEXT Barrett VISIT: None scheduled at this time  OBJECTIVE:  Note: Objective measures were completed at Evaluation unless otherwise noted.  DIAGNOSTIC FINDINGS: none available  PATIENT SURVEYS:  LEFS  06-01-24 LEFS 45/80 56.3% 06/20/24 46 58% perceived function MUSCLE LENGTH: Hamstrings: WFL Thomas test: No restriction  POSTURE: No Significant postural limitations  PALPATION: negative  LOWER  EXTREMITY ROM:  Active ROM Right eval Left eval 06/20/24 L  Hip flexion   132d  Hip extension     Hip abduction     Hip adduction     Hip internal rotation     Hip external rotation     Knee flexion 125d 118d   Knee extension 0d 0d   Ankle dorsiflexion     Ankle plantarflexion     Ankle inversion     Ankle eversion      (Blank rows = not tested)  LOWER EXTREMITY MMT:  MMT Right eval Left eval  Hip flexion    Hip extension    Hip abduction  4-  Hip adduction    Hip internal rotation    Hip external rotation    Knee flexion  4-  Knee extension  4-  Ankle dorsiflexion    Ankle plantarflexion    Ankle inversion    Ankle eversion     (Blank rows = not tested)  LOWER EXTREMITY SPECIAL TESTS:  deferred  FUNCTIONAL TESTS:  30 seconds chair stand test 9 reps w/o Ues; 06/20/24 10 reps  GAIT: Distance walked: 18ftx2 Assistive device utilized: Single point cane Level of assistance: Complete Independence Comments: unremarkable                                                                                                                                TREATMENT:  [facility] Adult PT Treatment:                                                DATE: 06/27/24 Therapeutic Exercise: Nustep L4 8 min L hip flexor stretch 60s x2 L ITB stretch 30s L piriformis stretch fig 4 30s x2  Therapeutic Activity: Supine hip fallouts BlaTB 10x B, 10/10 unilaterally Bridge against BlaTB 10x B clams S/L 10/10 BlaTB Bridge with ball 10x  [facility] Adult PT Treatment:                                                DATE: 06/23/24 Pt enters building ambulating independently with SPC. Treatment took place in water 3.8 to  4 ft 8 in. deep depending upon activity.  Pt entered and exited the pool via stair and handrails independently. Temperature 92 degrees.  Exercises Walking forwards/backwards/sidestepping x2 laps ea Standing Hip Abduction Adduction at Pool Wall x20 BIL Heel/toe raises x20 Hip Swing x20  BIL Farmer's Walk yellow DB x1 lap Sit to Stand from 3rd step from bottom no UE x10 Standing 'L' Stretch at pool wall x10 hold x10 Side stepping with yellow DB shoulder abd/add x2 laps High Knee Balance with Forward Walking x2 laps High Knee Balance with Backward Walking x 2 laps Standing Balance on Noodle at [employer] 2x20 Standing Hip Extension Stomps with Pool Noodle x20 BIL  Pt requires the buoyancy of water for active assisted exercises with buoyancy supported for strengthening and AROM exercises. Hydrostatic pressure also supports joints by unweighting joint load by at least 50 % in 3-4 feet depth water. 80% in chest to neck deep water. Water will provide assistance with movement using the current and laminar flow while the buoyancy reduces weight bearing. Pt requires the viscosity of the water for resistance with strengthening exercises.    [facility] Adult PT Treatment:                                                DATE: 06/20/24 Therapeutic Exercise: Nustep L7 5 min STS from airex w/5000g ball 10x L SKTC 132d  Therapeutic Activity: 16 steps with B rails and step through pattern LEFS retake 30s STS 10 reps Supine hip fallouts BlaTB 10x B, 10/10 unilaterally Bridge against BlaTB 10x B clams S/L 10/10 BlaTB  [facility] Adult PT Treatment:                                                DATE: 06/17/24 Pt enters building ambulating independently with SPC. Treatment took place in water 3.8 to  4 ft 8 in. deep depending upon activity.  Pt entered and exited the pool via stair and handrails independently. Temperature 92 degrees.  Exercises Walking forwards/backwards/sidestepping x2 laps ea Standing Hip Abduction Adduction at [employer]  Heel/toe raises x20 Hip Swing x20 BIL Farmer's Walk yellow DB x1 lap Suitcase carry yellow DB x2 laps Step Up x10 BIL Sit to Stand from 3rd step from bottom no UE x10 Standing 'L' Stretch at pool wall x10 hold x10 Side stepping with yellow DB  shoulder abd/add x2 laps High Knee Balance with Forward Walking (NT) High Knee Balance with Backward Walking (NT) Standing Balance on Noodle at [employer] 2x20 Standing hip flexion to knee extension 2x10 BIL Standing Hip Extension Stomps with Pool Noodle x15 BIL Standing Piriformis Release in Figure 4 Position x30 BIL  Pt requires the buoyancy of water for active assisted exercises with buoyancy supported for strengthening and AROM exercises. Hydrostatic pressure also supports joints by unweighting joint load by at least 50 % in 3-4 feet depth water. 80% in chest to neck deep water. Water will provide assistance with movement using the current and laminar flow while the buoyancy reduces weight bearing. Pt requires the viscosity of the water for resistance with strengthening exercises.    PATIENT EDUCATION:  Education details: Discussed eval findings, rehab rationale and POC and patient is in agreement  Person educated: Patient Education method: Explanation and Handouts Education comprehension: verbalized understanding and needs further education  HOME EXERCISE PROGRAM: Access Code: [email or web address] URL: [email or web address] Date: 04/20/2024 Prepared by: Reyes Kohut  Exercises - Supine Quad Set  - 2-3 x daily - 5 x weekly - 1 sets - 10 reps - 3s hold - Small Range Straight Leg Raise  - 2-3 x daily - 5 x weekly - 1 sets - 10 reps - Clamshell with Resistance  - 2-3 x daily - 5 x weekly - 1 sets - 10 reps Aquatic HEP Access Code: [email or web address] URL: [email or web address] Date: 06/07/2024 Prepared by: Graydon Dingwall   ASSESSMENT:  CLINICAL IMPRESSION:   Patient presents to PT reporting increased stiffness today, states that the last land session was challenging. Session today focused on LE strengthening, particularly with hip abduction, and ambulation in the aquatic environment for use of buoyancy to offload joints and the viscosity of water as resistance  during therapeutic exercise. Patient was able to tolerate all prescribed exercises in the aquatic environment with no adverse effects. Patient continues to benefit from skilled PT services on land and aquatic based and should be progressed as able to improve functional independence.  EVAL- Patient is a 73 y.o. female who was seen today for physical therapy evaluation and treatment for L LE weakness and dysfunction following fracture and ORIF 01/10/24.  Strength deficits note with 30s chair stand test, no leg length identified, extensor lag observed with SLR, L abduction strength limited.  Patient is a good candidate for OPPT to resolve strength, balance and ROM deficits and wean off cane when appropriate  OBJECTIVE IMPAIRMENTS: Abnormal gait, decreased activity tolerance, decreased balance, decreased endurance, decreased mobility, difficulty walking, decreased ROM, decreased strength, and pain.   ACTIVITY LIMITATIONS: carrying, lifting, standing, squatting, and stairs  PERSONAL FACTORS: Age, Past/current experiences, and Time since onset of injury/illness/exacerbation are also affecting patient's functional outcome.   REHAB POTENTIAL: Good  CLINICAL DECISION MAKING: Stable/uncomplicated  EVALUATION COMPLEXITY: Low   GOALS: Goals reviewed with patient? No  SHORT TERM GOALS: Target date: 05/11/2024   Patient to demonstrate independence in HEP  Baseline: [email or web address] Goal status: Met  2.  Assess 2 MWT Baseline: TBD; 05/16/24 350ft w/o cane Goal status: Met   LONG TERM GOALS: Target date: 06/01/2024    Patient will increase 30s chair stand reps from 9 to 12 without arms to demonstrate and improved functional ability with less pain/difficulty as well as reduce fall risk.  Baseline: 9 06-01-24  8  with wt bear to the Right 06/20/24 10 Goal status: ONGOING  2.  Patient will acknowledge 2/10 pain at least once during episode of care   Baseline: 4/10 06-01-24  2/10 but improving 06/20/24  1-2/10 Goal status: Met  3.  Patient will score at least 60/80 on LEFS to signify clinically meaningful improvement in functional abilities.   Baseline: 36/80 06-01-24  45/60 06/20/24 46/80 Goal status: ONGOING  4.  Increase L hip and knee strength to 4/5 in deficit regions Baseline: 4-/5 knee and hip abduction strength Goal status: ONGOING  5.  125d L knee flexion Baseline: 118d L knee flexion Goal status: ONGOING  6.  Patient to negotiate 16 steps with most appropriate pattern Baseline: TBD 06/20/24 16 steps with B rails and step through pattern Goal status: ONGOING   PLAN:  PT FREQUENCY: 2x/week  PT DURATION: 4 weeks  PLANNED INTERVENTIONS: 97110-Therapeutic exercises, 97530- Therapeutic activity, [id number]- Neuromuscular re-education, 97535- Self Care, [id number]- Manual therapy, [phone]- Gait training, [phone]- Aquatic Therapy, Patient/Family education, Balance training, and Stair training  PLAN FOR NEXT SESSION: HEP  review and update, manual techniques as appropriate, aerobic tasks, ROM and flexibility activities, strengthening and PREs, TPDN, gait and balance training as needed     Reyes CHRISTELLA Kohut PT  06/27/24 10:42 AM Phone: [phone] Fax: [phone]

## 2024-06-27 ENCOUNTER — Ambulatory Visit

## 2024-06-27 DIAGNOSIS — R2689 Other abnormalities of gait and mobility: Secondary | ICD-10-CM | POA: Diagnosis not present

## 2024-06-27 DIAGNOSIS — M6281 Muscle weakness (generalized): Secondary | ICD-10-CM | POA: Diagnosis not present

## 2024-06-27 DIAGNOSIS — R2681 Unsteadiness on feet: Secondary | ICD-10-CM

## 2024-06-29 ENCOUNTER — Encounter: Payer: Self-pay | Admitting: Physical Therapy

## 2024-06-29 ENCOUNTER — Ambulatory Visit: Admitting: Physical Therapy

## 2024-06-29 DIAGNOSIS — M6281 Muscle weakness (generalized): Secondary | ICD-10-CM

## 2024-06-29 DIAGNOSIS — R2681 Unsteadiness on feet: Secondary | ICD-10-CM

## 2024-06-29 DIAGNOSIS — R2689 Other abnormalities of gait and mobility: Secondary | ICD-10-CM | POA: Diagnosis not present

## 2024-06-29 NOTE — Therapy (Signed)
 OUTPATIENT PHYSICAL THERAPY TREATMENT NOTE   Patient Name: Veronica Barrett MRN: [id number] DOB:1951/01/12, 73 y.o., female Today's Date: 06/29/2024  END OF SESSION:  PT End of Session - 06/29/24 1459     Visit Number 18    Number of Visits 20    Date for PT Re-Evaluation 08/21/24    Authorization Type MCR    Progress Note Due on Visit 20    PT Start Time 1500    PT Stop Time 0341    PT Time Calculation (min) 761 min    Activity Tolerance Patient tolerated treatment well    Behavior During Therapy [facility] for tasks assessed/performed            Past Medical History:  Diagnosis Date   Allergy    seasonal allergy   Arthritis    left hip   Asthma    Depression    HSV infection    vag   Past Surgical History:  Procedure Laterality Date   APPENDECTOMY  11/17/1982   EYE SURGERY Left    left vitreous   LEG SURGERY Left 01/10/2024   TOTAL HIP ARTHROPLASTY Left 10/17/2014   Procedure: LEFT TOTAL HIP ARTHROPLASTY ANTERIOR APPROACH;  Surgeon: Donnice JONETTA Car, MD;  Location: [facility];  Service: Orthopedics;  Laterality: Left;   Patient Active Problem List   Diagnosis Date Noted   Osteoporosis without current pathological fracture 10/12/2023   Moderate persistent asthma 08/10/2023   Bronchitis 06/18/2021   Leukocytosis 06/17/2021   Environmental and seasonal allergies 10/07/2018   Chronic sinusitis 10/07/2018   H/O sinus surgery 10/07/2018   Hyperlipidemia 10/07/2018   S/P left THA, AA 10/17/2014   Recurrent HSV (herpes simplex virus) 04/21/2013   Depression, recurrent (HCC) 02/20/2010   Allergic rhinitis 04/16/2007    PCP: Wallace Joesph LABOR, PA   REFERRING PROVIDER: Chandra Toribio POUR, MD  REFERRING DIAG: [phone] (ICD-10-CM) - Closed fracture of left femur with routine healing, unspecified fracture morphology, unspecified portion of femur, subsequent encounter  THERAPY DIAG:  Muscle weakness (generalized)  Other abnormalities of gait and mobility  Unsteadiness on  feet  Rationale for Evaluation and Treatment: Rehabilitation  ONSET DATE: 01/10/24 DOS  SUBJECTIVE:   SUBJECTIVE STATEMENT:  Pt reports doing well today.  Mostly feels achy not pain.  PERTINENT HISTORY: HPI: Veronica Barrett 73 y.o. female with past medical history of moderate persistent asthma, hyperlipidemia and depression who presented to the emergency department after a mechanical fall. Patient's fall led to direct strike of her left knee to the ground. Patient noted severe pain and inability to ambulate at that time. Imaging revealed a left distal femoral shaft fracture   Patient was hospitalized for evaluation and management of left distal 3rd femur shaft fracture. Orthopedic surgery evaluated in consultation and she underwent left femur ORIF on 01/10/2024. She has been evaluated by PT/OT postoperatively with recommendations for acute rehab. Pt is medically stable and has been evaluated and found to be appropriate for [facility] and admitted.  PAIN:  Are you having pain? Yes: NPRS scale: 3/10 Pain location: L thigh Pain description: ache Aggravating factors: stairs, prolonged activity Relieving factors: rest   PRECAUTIONS: Fall  RED FLAGS: None   WEIGHT BEARING RESTRICTIONS: No  FALLS:  Has patient fallen in last 6 months? No  OCCUPATION: retired  PLOF: Independent with household mobility with device  PATIENT GOALS: To manage my symptoms and wean of the cane  NEXT MD VISIT: None scheduled at this time  OBJECTIVE:  Note: Objective  measures were completed at Evaluation unless otherwise noted.  DIAGNOSTIC FINDINGS: none available  PATIENT SURVEYS:  LEFS  06-01-24 LEFS 45/80 56.3% 06/20/24 46 58% perceived function MUSCLE LENGTH: Hamstrings: WFL Thomas test: No restriction  POSTURE: No Significant postural limitations  PALPATION: negative  LOWER EXTREMITY ROM:  Active ROM Right eval Left eval 06/20/24 L  Hip flexion   132d  Hip extension     Hip abduction     Hip  adduction     Hip internal rotation     Hip external rotation     Knee flexion 125d 118d   Knee extension 0d 0d   Ankle dorsiflexion     Ankle plantarflexion     Ankle inversion     Ankle eversion      (Blank rows = not tested)  LOWER EXTREMITY MMT:  MMT Right eval Left eval  Hip flexion    Hip extension    Hip abduction  4-  Hip adduction    Hip internal rotation    Hip external rotation    Knee flexion  4-  Knee extension  4-  Ankle dorsiflexion    Ankle plantarflexion    Ankle inversion    Ankle eversion     (Blank rows = not tested)  LOWER EXTREMITY SPECIAL TESTS:  deferred  FUNCTIONAL TESTS:  30 seconds chair stand test 9 reps w/o Ues; 06/20/24 10 reps  GAIT: Distance walked: 1ftx2 Assistive device utilized: Single point cane Level of assistance: Complete Independence Comments: unremarkable                                                                                                                                TREATMENT:   [facility] Adult PT Treatment:                                                DATE: 06/29/24 Pt enters building ambulating independently with SPC. Treatment took place in water 3.8 to  4 ft 8 in. deep depending upon activity.  Pt entered and exited the pool via stair and handrails independently. Temperature 92 degrees.  Exercises Walking forwards/backwards/sidestepping x2 laps ea Standing Hip Abduction Adduction at Pool Wall x20 BIL Hip abd circles SL heel raises x20 Hip Swing x20 BIL Hip openers with yellow noodle support Yellow noodle stomp Tandem and semi tandem stance with water bells High Knee Balance with Forward Walking x2 laps Tandem walking  Pt requires the buoyancy of water for active assisted exercises with buoyancy supported for strengthening and AROM exercises. Hydrostatic pressure also supports joints by unweighting joint load by at least 50 % in 3-4 feet depth water. 80% in chest to neck deep water. Water will provide  assistance with movement using the current and laminar flow while the buoyancy reduces weight bearing. Pt  requires the viscosity of the water for resistance with strengthening exercises.  [facility] Adult PT Treatment:                                                DATE: 06/27/24 Therapeutic Exercise: Nustep L4 8 min L hip flexor stretch 60s x2 L ITB stretch 30s L piriformis stretch fig 4 30s x2  Therapeutic Activity: Supine hip fallouts BlaTB 10x B, 10/10 unilaterally Bridge against BlaTB 10x B clams S/L 10/10 BlaTB Bridge with ball 10x  [facility] Adult PT Treatment:                                                DATE: 06/23/24 Pt enters building ambulating independently with SPC. Treatment took place in water 3.8 to  4 ft 8 in. deep depending upon activity.  Pt entered and exited the pool via stair and handrails independently. Temperature 92 degrees.  Exercises Walking forwards/backwards/sidestepping x2 laps ea Standing Hip Abduction Adduction at Pool Wall x20 BIL Heel/toe raises x20 Hip Swing x20 BIL Farmer's Walk yellow DB x1 lap Sit to Stand from 3rd step from bottom no UE x10 Standing 'L' Stretch at pool wall x10 hold x10 Side stepping with yellow DB shoulder abd/add x2 laps High Knee Balance with Forward Walking x2 laps High Knee Balance with Backward Walking x 2 laps Standing Balance on Noodle at [employer] 2x20 Standing Hip Extension Stomps with Pool Noodle x20 BIL  Pt requires the buoyancy of water for active assisted exercises with buoyancy supported for strengthening and AROM exercises. Hydrostatic pressure also supports joints by unweighting joint load by at least 50 % in 3-4 feet depth water. 80% in chest to neck deep water. Water will provide assistance with movement using the current and laminar flow while the buoyancy reduces weight bearing. Pt requires the viscosity of the water for resistance with strengthening exercises.    [facility] Adult PT Treatment:                                                 DATE: 06/20/24 Therapeutic Exercise: Nustep L7 5 min STS from airex w/5000g ball 10x L SKTC 132d  Therapeutic Activity: 16 steps with B rails and step through pattern LEFS retake 30s STS 10 reps Supine hip fallouts BlaTB 10x B, 10/10 unilaterally Bridge against BlaTB 10x B clams S/L 10/10 BlaTB  [facility] Adult PT Treatment:                                                DATE: 06/17/24 Pt enters building ambulating independently with SPC. Treatment took place in water 3.8 to  4 ft 8 in. deep depending upon activity.  Pt entered and exited the pool via stair and handrails independently. Temperature 92 degrees.  Exercises Walking forwards/backwards/sidestepping x2 laps ea Standing Hip Abduction Adduction at [employer]  Heel/toe raises x20 Hip Swing x20 BIL Farmer's Walk yellow DB x1  lap Suitcase carry yellow DB x2 laps Step Up x10 BIL Sit to Stand from 3rd step from bottom no UE x10 Standing 'L' Stretch at pool wall x10 hold x10 Side stepping with yellow DB shoulder abd/add x2 laps High Knee Balance with Forward Walking (NT) High Knee Balance with Backward Walking (NT) Standing Balance on Noodle at [employer]   Squat 2x20 Standing hip flexion to knee extension 2x10 BIL Standing Hip Extension Stomps with Pool Noodle x15 BIL Standing Piriformis Release in Figure 4 Position x30 BIL  Pt requires the buoyancy of water for active assisted exercises with buoyancy supported for strengthening and AROM exercises. Hydrostatic pressure also supports joints by unweighting joint load by at least 50 % in 3-4 feet depth water. 80% in chest to neck deep water. Water will provide assistance with movement using the current and laminar flow while the buoyancy reduces weight bearing. Pt requires the viscosity of the water for resistance with strengthening exercises.    PATIENT EDUCATION:  Education details: Discussed eval findings, rehab rationale and POC and patient is in  agreement  Person educated: Patient Education method: Explanation and Handouts Education comprehension: verbalized understanding and needs further education  HOME EXERCISE PROGRAM: Access Code: [email or web address] URL: [email or web address] Date: 04/20/2024 Prepared by: Reyes Kohut  Exercises - Supine Quad Set  - 2-3 x daily - 5 x weekly - 1 sets - 10 reps - 3s hold - Small Range Straight Leg Raise  - 2-3 x daily - 5 x weekly - 1 sets - 10 reps - Clamshell with Resistance  - 2-3 x daily - 5 x weekly - 1 sets - 10 reps Aquatic HEP Access Code: [email or web address] URL: [email or web address] Date: 06/07/2024 Prepared by: Graydon Dingwall   ASSESSMENT:  CLINICAL IMPRESSION:   Session today focused on hip strength and balance in the aquatic environment for use of buoyancy to offload joints and the viscosity of water as resistance during therapeutic exercise.  Pt challenged by exercises requiring SLS, particularly with dynamic component or perturbations.  Emphasized SLS positions with minimal or unstable UE support.  Patient was able to tolerate all prescribed exercises in the aquatic environment with no adverse effects and reports no increase pain at the end of the session. Patient continues to benefit from skilled PT services on land and aquatic based and should be progressed as able to improve functional independence.   EVAL- Patient is a 73 y.o. female who was seen today for physical therapy evaluation and treatment for L LE weakness and dysfunction following fracture and ORIF 01/10/24.  Strength deficits note with 30s chair stand test, no leg length identified, extensor lag observed with SLR, L abduction strength limited.  Patient is a good candidate for OPPT to resolve strength, balance and ROM deficits and wean off cane when appropriate  OBJECTIVE IMPAIRMENTS: Abnormal gait, decreased activity tolerance, decreased balance, decreased endurance, decreased mobility, difficulty  walking, decreased ROM, decreased strength, and pain.   ACTIVITY LIMITATIONS: carrying, lifting, standing, squatting, and stairs  PERSONAL FACTORS: Age, Past/current experiences, and Time since onset of injury/illness/exacerbation are also affecting patient's functional outcome.   REHAB POTENTIAL: Good  CLINICAL DECISION MAKING: Stable/uncomplicated  EVALUATION COMPLEXITY: Low   GOALS: Goals reviewed with patient? No  SHORT TERM GOALS: Target date: 05/11/2024   Patient to demonstrate independence in HEP  Baseline: [email or web address] Goal status: Met  2.  Assess 2 MWT Baseline: TBD; 05/16/24 312ft w/o cane Goal status: Met   LONG TERM GOALS: Target date: 06/01/2024  Patient will increase 30s chair stand reps from 9 to 12 without arms to demonstrate and improved functional ability with less pain/difficulty as well as reduce fall risk.  Baseline: 9 06-01-24  8  with wt bear to the Right 06/20/24 10 Goal status: ONGOING  2.  Patient will acknowledge 2/10 pain at least once during episode of care   Baseline: 4/10 06-01-24  2/10 but improving 06/20/24 1-2/10 Goal status: Met  3.  Patient will score at least 60/80 on LEFS to signify clinically meaningful improvement in functional abilities.   Baseline: 36/80 06-01-24  45/60 06/20/24 46/80 Goal status: ONGOING  4.  Increase L hip and knee strength to 4/5 in deficit regions Baseline: 4-/5 knee and hip abduction strength Goal status: ONGOING  5.  125d L knee flexion Baseline: 118d L knee flexion Goal status: ONGOING  6.  Patient to negotiate 16 steps with most appropriate pattern Baseline: TBD 06/20/24 16 steps with B rails and step through pattern Goal status: ONGOING   PLAN:  PT FREQUENCY: 2x/week  PT DURATION: 4 weeks  PLANNED INTERVENTIONS: 97110-Therapeutic exercises, 97530- Therapeutic activity, 97112- Neuromuscular re-education, 97535- Self Care, [id number]- Manual therapy, [phone]- Gait training, [phone]- Aquatic Therapy,  Patient/Family education, Balance training, and Stair training  PLAN FOR NEXT SESSION: HEP review and update, manual techniques as appropriate, aerobic tasks, ROM and flexibility activities, strengthening and PREs, TPDN, gait and balance training as needed     Helene FORBES Gasmen PT  06/29/24 3:45 PM Phone: [phone] Fax: [phone]

## 2024-06-30 NOTE — Therapy (Unsigned)
 OUTPATIENT PHYSICAL THERAPY TREATMENT NOTE   Patient Name: Veronica Barrett MRN: [id number] DOB:10/31/1951, 73 y.o., female Today's Date: 07/04/2024  END OF SESSION:  PT End of Session - 07/04/24 1002     Visit Number 19    Number of Visits 20    Date for PT Re-Evaluation 08/21/24    Authorization Type MCR    Progress Note Due on Visit 20    PT Start Time 1000    PT Stop Time 1040    PT Time Calculation (min) 40 min    Activity Tolerance Patient tolerated treatment well    Behavior During Therapy [facility] for tasks assessed/performed             Past Medical History:  Diagnosis Date   Allergy    seasonal allergy   Arthritis    left hip   Asthma    Depression    HSV infection    vag   Past Surgical History:  Procedure Laterality Date   APPENDECTOMY  11/17/1982   EYE SURGERY Left    left vitreous   LEG SURGERY Left 01/10/2024   TOTAL HIP ARTHROPLASTY Left 10/17/2014   Procedure: LEFT TOTAL HIP ARTHROPLASTY ANTERIOR APPROACH;  Surgeon: Donnice JONETTA Car, MD;  Location: [facility];  Service: Orthopedics;  Laterality: Left;   Patient Active Problem List   Diagnosis Date Noted   Osteoporosis without current pathological fracture 10/12/2023   Moderate persistent asthma 08/10/2023   Bronchitis 06/18/2021   Leukocytosis 06/17/2021   Environmental and seasonal allergies 10/07/2018   Chronic sinusitis 10/07/2018   H/O sinus surgery 10/07/2018   Hyperlipidemia 10/07/2018   S/P left THA, AA 10/17/2014   Recurrent HSV (herpes simplex virus) 04/21/2013   Depression, recurrent (HCC) 02/20/2010   Allergic rhinitis 04/16/2007    PCP: Wallace Joesph LABOR, PA   REFERRING PROVIDER: Chandra Toribio POUR, MD  REFERRING DIAG: [phone] (ICD-10-CM) - Closed fracture of left femur with routine healing, unspecified fracture morphology, unspecified portion of femur, subsequent encounter  THERAPY DIAG:  Muscle weakness (generalized)  Other abnormalities of gait and mobility  Unsteadiness on  feet  Rationale for Evaluation and Treatment: Rehabilitation  ONSET DATE: 01/10/24 DOS  SUBJECTIVE:   SUBJECTIVE STATEMENT:  Still experiences morning stiffness which resolves with activity.  Forgot cane at home but feels she is doing well w/o it.  Only complaint is catching her R toe when ambulating w/o cane.  PERTINENT HISTORY: HPI: Veronica Barrett 73 y.o. female with past medical history of moderate persistent asthma, hyperlipidemia and depression who presented to the emergency department after a mechanical fall. Patient's fall led to direct strike of her left knee to the ground. Patient noted severe pain and inability to ambulate at that time. Imaging revealed a left distal femoral shaft fracture   Patient was hospitalized for evaluation and management of left distal 3rd femur shaft fracture. Orthopedic surgery evaluated in consultation and she underwent left femur ORIF on 01/10/2024. She has been evaluated by PT/OT postoperatively with recommendations for acute rehab. Pt is medically stable and has been evaluated and found to be appropriate for [facility] and admitted.  PAIN:  Are you having pain? Yes: NPRS scale: 3/10 Pain location: L thigh Pain description: ache Aggravating factors: stairs, prolonged activity Relieving factors: rest   PRECAUTIONS: Fall  RED FLAGS: None   WEIGHT BEARING RESTRICTIONS: No  FALLS:  Has patient fallen in last 6 months? No  OCCUPATION: retired  PLOF: Independent with household mobility with device  PATIENT GOALS:  To manage my symptoms and wean of the cane  NEXT MD VISIT: None scheduled at this time  OBJECTIVE:  Note: Objective measures were completed at Evaluation unless otherwise noted.  DIAGNOSTIC FINDINGS: none available  PATIENT SURVEYS:  LEFS  06-01-24 LEFS 45/80 56.3% 06/20/24 46 58% perceived function MUSCLE LENGTH: Hamstrings: WFL Thomas test: No restriction  POSTURE: No Significant postural limitations  PALPATION: negative  LOWER  EXTREMITY ROM:  Active ROM Right eval Left eval 06/20/24 L  Hip flexion   132d  Hip extension     Hip abduction     Hip adduction     Hip internal rotation     Hip external rotation     Knee flexion 125d 118d   Knee extension 0d 0d   Ankle dorsiflexion     Ankle plantarflexion     Ankle inversion     Ankle eversion      (Blank rows = not tested)  LOWER EXTREMITY MMT:  MMT Right eval Left eval  Hip flexion    Hip extension    Hip abduction  4-  Hip adduction    Hip internal rotation    Hip external rotation    Knee flexion  4-  Knee extension  4-  Ankle dorsiflexion    Ankle plantarflexion    Ankle inversion    Ankle eversion     (Blank rows = not tested)  LOWER EXTREMITY SPECIAL TESTS:  deferred  FUNCTIONAL TESTS:  30 seconds chair stand test 9 reps w/o Ues; 06/20/24 10 reps  GAIT: Distance walked: 87ftx2 Assistive device utilized: Single point cane Level of assistance: Complete Independence Comments: unremarkable                                                                                                                                TREATMENT:  [facility] Adult PT Treatment:                                                DATE: 07/04/24 Therapeutic Exercise: Nustep L8 8 min Neuromuscular re-ed: Cable walks 4 way 13# 5x Runners step 6 in 5# KB 15/15 Therapeutic Activity: L hip flexor stretch 5# 60s x2 Gastroc stretch on slant board 60s x2 Leg press 45# B, 25# R, 20# 15x ea   [facility] Adult PT Treatment:                                                DATE: 06/29/24 Pt enters building ambulating independently with SPC. Treatment took place in water 3.8 to  4 ft 8 in. deep depending upon activity.  Pt entered and exited the pool via stair and handrails  independently. Temperature 92 degrees.  Exercises Walking forwards/backwards/sidestepping x2 laps ea Standing Hip Abduction Adduction at Pool Wall x20 BIL Hip abd circles SL heel raises x20 Hip Swing x20  BIL Hip openers with yellow noodle support Yellow noodle stomp Tandem and semi tandem stance with water bells High Knee Balance with Forward Walking x2 laps Tandem walking  Pt requires the buoyancy of water for active assisted exercises with buoyancy supported for strengthening and AROM exercises. Hydrostatic pressure also supports joints by unweighting joint load by at least 50 % in 3-4 feet depth water. 80% in chest to neck deep water. Water will provide assistance with movement using the current and laminar flow while the buoyancy reduces weight bearing. Pt requires the viscosity of the water for resistance with strengthening exercises.  [facility] Adult PT Treatment:                                                DATE: 06/27/24 Therapeutic Exercise: Nustep L4 8 min L hip flexor stretch 60s x2 L ITB stretch 30s L piriformis stretch fig 4 30s x2  Therapeutic Activity: Supine hip fallouts BlaTB 10x B, 10/10 unilaterally Bridge against BlaTB 10x B clams S/L 10/10 BlaTB Bridge with ball 10x  [facility] Adult PT Treatment:                                                DATE: 06/23/24 Pt enters building ambulating independently with SPC. Treatment took place in water 3.8 to  4 ft 8 in. deep depending upon activity.  Pt entered and exited the pool via stair and handrails independently. Temperature 92 degrees.  Exercises Walking forwards/backwards/sidestepping x2 laps ea Standing Hip Abduction Adduction at Pool Wall x20 BIL Heel/toe raises x20 Hip Swing x20 BIL Farmer's Walk yellow DB x1 lap Sit to Stand from 3rd step from bottom no UE x10 Standing 'L' Stretch at pool wall x10 hold x10 Side stepping with yellow DB shoulder abd/add x2 laps High Knee Balance with Forward Walking x2 laps High Knee Balance with Backward Walking x 2 laps Standing Balance on Noodle at [employer] 2x20 Standing Hip Extension Stomps with Pool Noodle x20 BIL  Pt requires the buoyancy of water for active assisted  exercises with buoyancy supported for strengthening and AROM exercises. Hydrostatic pressure also supports joints by unweighting joint load by at least 50 % in 3-4 feet depth water. 80% in chest to neck deep water. Water will provide assistance with movement using the current and laminar flow while the buoyancy reduces weight bearing. Pt requires the viscosity of the water for resistance with strengthening exercises.    [facility] Adult PT Treatment:                                                DATE: 06/20/24 Therapeutic Exercise: Nustep L7 5 min STS from airex w/5000g ball 10x L SKTC 132d  Therapeutic Activity: 16 steps with B rails and step through pattern LEFS retake 30s STS 10 reps Supine hip fallouts BlaTB 10x B, 10/10 unilaterally Bridge against BlaTB 10x B  clams S/L 10/10 BlaTB  [facility] Adult PT Treatment:                                                DATE: 06/17/24 Pt enters building ambulating independently with SPC. Treatment took place in water 3.8 to  4 ft 8 in. deep depending upon activity.  Pt entered and exited the pool via stair and handrails independently. Temperature 92 degrees.  Exercises Walking forwards/backwards/sidestepping x2 laps ea Standing Hip Abduction Adduction at [employer]  Heel/toe raises x20 Hip Swing x20 BIL Farmer's Walk yellow DB x1 lap Suitcase carry yellow DB x2 laps Step Up x10 BIL Sit to Stand from 3rd step from bottom no UE x10 Standing 'L' Stretch at pool wall x10 hold x10 Side stepping with yellow DB shoulder abd/add x2 laps High Knee Balance with Forward Walking (NT) High Knee Balance with Backward Walking (NT) Standing Balance on Noodle at [employer] 2x20 Standing hip flexion to knee extension 2x10 BIL Standing Hip Extension Stomps with Pool Noodle x15 BIL Standing Piriformis Release in Figure 4 Position x30 BIL  Pt requires the buoyancy of water for active assisted exercises with buoyancy supported for strengthening and AROM  exercises. Hydrostatic pressure also supports joints by unweighting joint load by at least 50 % in 3-4 feet depth water. 80% in chest to neck deep water. Water will provide assistance with movement using the current and laminar flow while the buoyancy reduces weight bearing. Pt requires the viscosity of the water for resistance with strengthening exercises.    PATIENT EDUCATION:  Education details: Discussed eval findings, rehab rationale and POC and patient is in agreement  Person educated: Patient Education method: Explanation and Handouts Education comprehension: verbalized understanding and needs further education  HOME EXERCISE PROGRAM: Access Code: [email or web address] URL: [email or web address] Date: 04/20/2024 Prepared by: Reyes Kohut  Exercises - Supine Quad Set  - 2-3 x daily - 5 x weekly - 1 sets - 10 reps - 3s hold - Small Range Straight Leg Raise  - 2-3 x daily - 5 x weekly - 1 sets - 10 reps - Clamshell with Resistance  - 2-3 x daily - 5 x weekly - 1 sets - 10 reps Aquatic HEP Access Code: [email or web address] URL: [email or web address] Date: 06/07/2024 Prepared by: Graydon Dingwall   ASSESSMENT:  CLINICAL IMPRESSION:  Ambulating safely w/o cane.  Advanced to more challenging tasks of cable walks and introduced isotonic strengthening focused on hip extension.  EVAL- Patient is a 73 y.o. female who was seen today for physical therapy evaluation and treatment for L LE weakness and dysfunction following fracture and ORIF 01/10/24.  Strength deficits note with 30s chair stand test, no leg length identified, extensor lag observed with SLR, L abduction strength limited.  Patient is a good candidate for OPPT to resolve strength, balance and ROM deficits and wean off cane when appropriate  OBJECTIVE IMPAIRMENTS: Abnormal gait, decreased activity tolerance, decreased balance, decreased endurance, decreased mobility, difficulty walking, decreased ROM, decreased strength,  and pain.   ACTIVITY LIMITATIONS: carrying, lifting, standing, squatting, and stairs  PERSONAL FACTORS: Age, Past/current experiences, and Time since onset of injury/illness/exacerbation are also affecting patient's functional outcome.   REHAB POTENTIAL: Good  CLINICAL DECISION MAKING: Stable/uncomplicated  EVALUATION COMPLEXITY: Low   GOALS: Goals reviewed with patient? No  SHORT TERM GOALS: Target date: 05/11/2024  Patient to demonstrate independence in HEP  Baseline: [email or web address] Goal status: Met  2.  Assess 2 MWT Baseline: TBD; 05/16/24 355ft w/o cane Goal status: Met   LONG TERM GOALS: Target date: 06/01/2024    Patient will increase 30s chair stand reps from 9 to 12 without arms to demonstrate and improved functional ability with less pain/difficulty as well as reduce fall risk.  Baseline: 9 06-01-24  8  with wt bear to the Right 06/20/24 10 Goal status: ONGOING  2.  Patient will acknowledge 2/10 pain at least once during episode of care   Baseline: 4/10 06-01-24  2/10 but improving 06/20/24 1-2/10 Goal status: Met  3.  Patient will score at least 60/80 on LEFS to signify clinically meaningful improvement in functional abilities.   Baseline: 36/80 06-01-24  45/60 06/20/24 46/80 Goal status: ONGOING  4.  Increase L hip and knee strength to 4/5 in deficit regions Baseline: 4-/5 knee and hip abduction strength Goal status: ONGOING  5.  125d L knee flexion Baseline: 118d L knee flexion Goal status: ONGOING  6.  Patient to negotiate 16 steps with most appropriate pattern Baseline: TBD 06/20/24 16 steps with B rails and step through pattern Goal status: ONGOING   PLAN:  PT FREQUENCY: 2x/week  PT DURATION: 4 weeks  PLANNED INTERVENTIONS: 97110-Therapeutic exercises, 97530- Therapeutic activity, 97112- Neuromuscular re-education, 97535- Self Care, [id number]- Manual therapy, [phone]- Gait training, [phone]- Aquatic Therapy, Patient/Family education, Balance training, and Stair  training  PLAN FOR NEXT SESSION: HEP review and update, manual techniques as appropriate, aerobic tasks, ROM and flexibility activities, strengthening and PREs, TPDN, gait and balance training as needed     Reyes CHRISTELLA Kohut PT  07/04/24 11:09 AM Phone: [phone] Fax: [phone]

## 2024-07-04 ENCOUNTER — Ambulatory Visit

## 2024-07-04 DIAGNOSIS — M6281 Muscle weakness (generalized): Secondary | ICD-10-CM

## 2024-07-04 DIAGNOSIS — R2681 Unsteadiness on feet: Secondary | ICD-10-CM

## 2024-07-04 DIAGNOSIS — R2689 Other abnormalities of gait and mobility: Secondary | ICD-10-CM | POA: Diagnosis not present

## 2024-07-06 ENCOUNTER — Encounter: Payer: Self-pay | Admitting: Physical Therapy

## 2024-07-06 ENCOUNTER — Ambulatory Visit: Admitting: Physical Therapy

## 2024-07-06 DIAGNOSIS — M6281 Muscle weakness (generalized): Secondary | ICD-10-CM | POA: Diagnosis not present

## 2024-07-06 DIAGNOSIS — R2681 Unsteadiness on feet: Secondary | ICD-10-CM | POA: Diagnosis not present

## 2024-07-06 DIAGNOSIS — R2689 Other abnormalities of gait and mobility: Secondary | ICD-10-CM

## 2024-07-06 NOTE — Therapy (Signed)
 Progress Note Reporting Period 7/16 to 8/20  See note below for Objective Data and Assessment of Progress/Goals.       Patient Name: Veronica Barrett MRN: [id number] DOB:14-Aug-1951, 73 y.o., female Today's Date: 07/06/2024  END OF SESSION:  PT End of Session - 07/06/24 1234     Visit Number 20    Number of Visits 20    Date for PT Re-Evaluation 08/21/24    Authorization Type MCR    Progress Note Due on Visit 20    PT Start Time 1230    PT Stop Time 1314    PT Time Calculation (min) 44 min    Activity Tolerance Patient tolerated treatment well    Behavior During Therapy [facility] for tasks assessed/performed             Past Medical History:  Diagnosis Date   Allergy    seasonal allergy   Arthritis    left hip   Asthma    Depression    HSV infection    vag   Past Surgical History:  Procedure Laterality Date   APPENDECTOMY  11/17/1982   EYE SURGERY Left    left vitreous   LEG SURGERY Left 01/10/2024   TOTAL HIP ARTHROPLASTY Left 10/17/2014   Procedure: LEFT TOTAL HIP ARTHROPLASTY ANTERIOR APPROACH;  Surgeon: Donnice JONETTA Car, MD;  Location: [facility];  Service: Orthopedics;  Laterality: Left;   Patient Active Problem List   Diagnosis Date Noted   Osteoporosis without current pathological fracture 10/12/2023   Moderate persistent asthma 08/10/2023   Bronchitis 06/18/2021   Leukocytosis 06/17/2021   Environmental and seasonal allergies 10/07/2018   Chronic sinusitis 10/07/2018   H/O sinus surgery 10/07/2018   Hyperlipidemia 10/07/2018   S/P left THA, AA 10/17/2014   Recurrent HSV (herpes simplex virus) 04/21/2013   Depression, recurrent (HCC) 02/20/2010   Allergic rhinitis 04/16/2007    PCP: Wallace Joesph LABOR, PA   REFERRING PROVIDER: Chandra Toribio POUR, MD  REFERRING DIAG: [id number] (ICD-10-CM) - Closed fracture of left femur with routine healing, unspecified fracture morphology, unspecified portion of femur, subsequent encounter  THERAPY DIAG:  Muscle weakness  (generalized)  Other abnormalities of gait and mobility  Unsteadiness on feet  Rationale for Evaluation and Treatment: Rehabilitation  ONSET DATE: 01/10/24 DOS  SUBJECTIVE:   SUBJECTIVE STATEMENT:  Pt reports slightly higher pain today when ambulating w/o cane.  PERTINENT HISTORY: HPI: Veronica Barrett 73 y.o. female with past medical history of moderate persistent asthma, hyperlipidemia and depression who presented to the emergency department after a mechanical fall. Patient's fall led to direct strike of her left knee to the ground. Patient noted severe pain and inability to ambulate at that time. Imaging revealed a left distal femoral shaft fracture   Patient was hospitalized for evaluation and management of left distal 3rd femur shaft fracture. Orthopedic surgery evaluated in consultation and she underwent left femur ORIF on 01/10/2024. She has been evaluated by PT/OT postoperatively with recommendations for acute rehab. Pt is medically stable and has been evaluated and found to be appropriate for [facility] and admitted.  PAIN:  Are you having pain? Yes: NPRS scale: 3/10 Pain location: L thigh Pain description: ache Aggravating factors: stairs, prolonged activity Relieving factors: rest   PRECAUTIONS: Fall  RED FLAGS: None   WEIGHT BEARING RESTRICTIONS: No  FALLS:  Has patient fallen in last 6 months? No  OCCUPATION: retired  PLOF: Independent with household mobility with device  PATIENT GOALS: To manage my symptoms and wean  of the cane  NEXT MD VISIT: None scheduled at this time  OBJECTIVE:  Note: Objective measures were completed at Evaluation unless otherwise noted.  DIAGNOSTIC FINDINGS: none available  PATIENT SURVEYS:  LEFS  06-01-24 LEFS 45/80 56.3% 06/20/24 46 58% perceived function MUSCLE LENGTH: Hamstrings: WFL Thomas test: No restriction  POSTURE: No Significant postural limitations  PALPATION: negative  LOWER EXTREMITY ROM:  Active ROM Right eval  Left eval 06/20/24 L  Hip flexion   132d  Hip extension     Hip abduction     Hip adduction     Hip internal rotation     Hip external rotation     Knee flexion 125d 118d   Knee extension 0d 0d   Ankle dorsiflexion     Ankle plantarflexion     Ankle inversion     Ankle eversion      (Blank rows = not tested)  LOWER EXTREMITY MMT:  MMT Right eval Left eval  Hip flexion    Hip extension    Hip abduction  4-  Hip adduction    Hip internal rotation    Hip external rotation    Knee flexion  4-  Knee extension  4-  Ankle dorsiflexion    Ankle plantarflexion    Ankle inversion    Ankle eversion     (Blank rows = not tested)  LOWER EXTREMITY SPECIAL TESTS:  deferred  FUNCTIONAL TESTS:  30 seconds chair stand test 9 reps w/o Ues; 06/20/24 10 reps  GAIT: Distance walked: 18ftx2 Assistive device utilized: Single point cane Level of assistance: Complete Independence Comments: unremarkable                                                                                                                                TREATMENT:   [facility] Adult PT Treatment:                                                DATE: 06/29/24 Pt enters building ambulating independently with SPC. Treatment took place in water 3.8 to  4 ft 8 in. deep depending upon activity.  Pt entered and exited the pool via stair and handrails independently. Temperature 92 degrees.  Fins used for extra resistance   Exercises Walking forwards/backwards/sidestepping x2 laps ea Standing Hip Abduction Adduction at [employer] x20 BIL Hip abd circles SL heel raises x20 Yellow noodle stomp Lateral walking with band at toes Sit to stand from bench Step up fwd and lat High Knee Balance with Forward Walking x2 laps retro  Pt requires the buoyancy of water for active assisted exercises with buoyancy supported for strengthening and AROM exercises. Hydrostatic pressure also supports joints by unweighting joint load by at  least 50 % in 3-4 feet depth water. 80% in chest to neck  deep water. Water will provide assistance with movement using the current and laminar flow while the buoyancy reduces weight bearing. Pt requires the viscosity of the water for resistance with strengthening exercises.  [facility] Adult PT Treatment:                                                DATE: 07/04/24 Therapeutic Exercise: Nustep L8 8 min Neuromuscular re-ed: Cable walks 4 way 13# 5x Runners step 6 in 5# KB 15/15 Therapeutic Activity: L hip flexor stretch 5# 60s x2 Gastroc stretch on slant board 60s x2 Leg press 45# B, 25# R, 20# 15x ea   [facility] Adult PT Treatment:                                                DATE: 06/29/24 Pt enters building ambulating independently with SPC. Treatment took place in water 3.8 to  4 ft 8 in. deep depending upon activity.  Pt entered and exited the pool via stair and handrails independently. Temperature 92 degrees.  Exercises Walking forwards/backwards/sidestepping x2 laps ea Standing Hip Abduction Adduction at Pool Wall x20 BIL Hip abd circles SL heel raises x20 Hip Swing x20 BIL Hip openers with yellow noodle support Yellow noodle stomp Tandem and semi tandem stance with water bells High Knee Balance with Forward Walking x2 laps Tandem walking  Pt requires the buoyancy of water for active assisted exercises with buoyancy supported for strengthening and AROM exercises. Hydrostatic pressure also supports joints by unweighting joint load by at least 50 % in 3-4 feet depth water. 80% in chest to neck deep water. Water will provide assistance with movement using the current and laminar flow while the buoyancy reduces weight bearing. Pt requires the viscosity of the water for resistance with strengthening exercises.  [facility] Adult PT Treatment:                                                DATE: 06/27/24 Therapeutic Exercise: Nustep L4 8 min L hip flexor stretch 60s x2 L ITB stretch 30s L  piriformis stretch fig 4 30s x2  Therapeutic Activity: Supine hip fallouts BlaTB 10x B, 10/10 unilaterally Bridge against BlaTB 10x B clams S/L 10/10 BlaTB Bridge with ball 10x  [facility] Adult PT Treatment:                                                DATE: 06/23/24 Pt enters building ambulating independently with SPC. Treatment took place in water 3.8 to  4 ft 8 in. deep depending upon activity.  Pt entered and exited the pool via stair and handrails independently. Temperature 92 degrees.  Exercises Walking forwards/backwards/sidestepping x2 laps ea Standing Hip Abduction Adduction at Pool Wall x20 BIL Heel/toe raises x20 Hip Swing x20 BIL Farmer's Walk yellow DB x1 lap Sit to Stand from 3rd step from bottom no UE x10 Standing 'L' Stretch at pool wall x10 hold x10 Side stepping with  yellow DB shoulder abd/add x2 laps High Knee Balance with Forward Walking x2 laps High Knee Balance with Backward Walking x 2 laps Standing Balance on Noodle at [employer]   Squat 2x20 Standing Hip Extension Stomps with Pool Noodle x20 BIL  Pt requires the buoyancy of water for active assisted exercises with buoyancy supported for strengthening and AROM exercises. Hydrostatic pressure also supports joints by unweighting joint load by at least 50 % in 3-4 feet depth water. 80% in chest to neck deep water. Water will provide assistance with movement using the current and laminar flow while the buoyancy reduces weight bearing. Pt requires the viscosity of the water for resistance with strengthening exercises.    [facility] Adult PT Treatment:                                                DATE: 06/20/24 Therapeutic Exercise: Nustep L7 5 min STS from airex w/5000g ball 10x L SKTC 132d  Therapeutic Activity: 16 steps with B rails and step through pattern LEFS retake 30s STS 10 reps Supine hip fallouts BlaTB 10x B, 10/10 unilaterally Bridge against BlaTB 10x B clams S/L 10/10 BlaTB  [facility] Adult PT Treatment:                                                 DATE: 06/17/24 Pt enters building ambulating independently with SPC. Treatment took place in water 3.8 to  4 ft 8 in. deep depending upon activity.  Pt entered and exited the pool via stair and handrails independently. Temperature 92 degrees.  Exercises Walking forwards/backwards/sidestepping x2 laps ea Standing Hip Abduction Adduction at [employer]  Heel/toe raises x20 Hip Swing x20 BIL Farmer's Walk yellow DB x1 lap Suitcase carry yellow DB x2 laps Step Up x10 BIL Sit to Stand from 3rd step from bottom no UE x10 Standing 'L' Stretch at pool wall x10 hold x10 Side stepping with yellow DB shoulder abd/add x2 laps High Knee Balance with Forward Walking (NT) High Knee Balance with Backward Walking (NT) Standing Balance on Noodle at [employer] 2x20 Standing hip flexion to knee extension 2x10 BIL Standing Hip Extension Stomps with Pool Noodle x15 BIL Standing Piriformis Release in Figure 4 Position x30 BIL  Pt requires the buoyancy of water for active assisted exercises with buoyancy supported for strengthening and AROM exercises. Hydrostatic pressure also supports joints by unweighting joint load by at least 50 % in 3-4 feet depth water. 80% in chest to neck deep water. Water will provide assistance with movement using the current and laminar flow while the buoyancy reduces weight bearing. Pt requires the viscosity of the water for resistance with strengthening exercises.    PATIENT EDUCATION:  Education details: Discussed eval findings, rehab rationale and POC and patient is in agreement  Person educated: Patient Education method: Explanation and Handouts Education comprehension: verbalized understanding and needs further education  HOME EXERCISE PROGRAM: Access Code: [email or web address] URL: [email or web address] Date: 04/20/2024 Prepared by: Reyes Kohut  Exercises - Supine Quad Set  - 2-3 x daily - 5 x weekly - 1 sets - 10  reps - 3s hold - Small Range Straight Leg Raise  - 2-3 x daily - 5  x weekly - 1 sets - 10 reps - Clamshell with Resistance  - 2-3 x daily - 5 x weekly - 1 sets - 10 reps Aquatic HEP Access Code: [email or web address] URL: [email or web address] Date: 06/07/2024 Prepared by: Graydon Dingwall   ASSESSMENT:  CLINICAL IMPRESSION:    Pt did well with aquatic therapy today with concentration on gait and hip strength.  Overall pt has made significant progress toward goals since starting PT including improved LE and hip strength, ability to walk without AD with mild gait deviations, improved 30'' STS, improved LEFS score.  She will benefit form continued PT to address remaining deficits including LE strength, gait, balance, and endurance.  EVAL- Patient is a 73 y.o. female who was seen today for physical therapy evaluation and treatment for L LE weakness and dysfunction following fracture and ORIF 01/10/24.  Strength deficits note with 30s chair stand test, no leg length identified, extensor lag observed with SLR, L abduction strength limited.  Patient is a good candidate for OPPT to resolve strength, balance and ROM deficits and wean off cane when appropriate  OBJECTIVE IMPAIRMENTS: Abnormal gait, decreased activity tolerance, decreased balance, decreased endurance, decreased mobility, difficulty walking, decreased ROM, decreased strength, and pain.   ACTIVITY LIMITATIONS: carrying, lifting, standing, squatting, and stairs  PERSONAL FACTORS: Age, Past/current experiences, and Time since onset of injury/illness/exacerbation are also affecting patient's functional outcome.   REHAB POTENTIAL: Good  CLINICAL DECISION MAKING: Stable/uncomplicated  EVALUATION COMPLEXITY: Low   GOALS: Goals reviewed with patient? No  SHORT TERM GOALS: Target date: 05/11/2024   Patient to demonstrate independence in HEP  Baseline: [email or web address] Goal status: Met  2.  Assess 2 MWT Baseline: TBD; 05/16/24 381ft w/o  cane Goal status: Met   LONG TERM GOALS: Target date: 06/01/2024    Patient will increase 30s chair stand reps from 9 to 12 without arms to demonstrate and improved functional ability with less pain/difficulty as well as reduce fall risk.  Baseline: 9 06-01-24  8  with wt bear to the Right 06/20/24 10 Goal status: ONGOING  2.  Patient will acknowledge 2/10 pain at least once during episode of care   Baseline: 4/10 06-01-24  2/10 but improving 06/20/24 1-2/10 Goal status: Met  3.  Patient will score at least 60/80 on LEFS to signify clinically meaningful improvement in functional abilities.   Baseline: 36/80 06-01-24  45/60 06/20/24 46/80 Goal status: ONGOING  4.  Increase L hip and knee strength to 4/5 in deficit regions Baseline: 4-/5 knee and hip abduction strength Goal status: ONGOING  5.  125d L knee flexion Baseline: 118d L knee flexion Goal status: ONGOING  6.  Patient to negotiate 16 steps with most appropriate pattern Baseline: TBD 06/20/24 16 steps with B rails and step through pattern Goal status: ONGOING   PLAN:  PT FREQUENCY: 2x/week  PT DURATION: 4 weeks  PLANNED INTERVENTIONS: 97110-Therapeutic exercises, 97530- Therapeutic activity, 97112- Neuromuscular re-education, 97535- Self Care, [id number]- Manual therapy, [phone]- Gait training, [phone]- Aquatic Therapy, Patient/Family education, Balance training, and Stair training  PLAN FOR NEXT SESSION: HEP review and update, manual techniques as appropriate, aerobic tasks, ROM and flexibility activities, strengthening and PREs, TPDN, gait and balance training as needed     Helene FORBES Gasmen PT  07/06/24 1:26 PM Phone: [phone] Fax: [phone]

## 2024-07-11 ENCOUNTER — Ambulatory Visit

## 2024-07-11 DIAGNOSIS — R2689 Other abnormalities of gait and mobility: Secondary | ICD-10-CM | POA: Diagnosis not present

## 2024-07-11 DIAGNOSIS — R2681 Unsteadiness on feet: Secondary | ICD-10-CM | POA: Diagnosis not present

## 2024-07-11 DIAGNOSIS — M6281 Muscle weakness (generalized): Secondary | ICD-10-CM

## 2024-07-11 NOTE — Therapy (Signed)
 PHYSICAL THERAPY TREATMENT NOTE      Patient Name: Veronica Barrett MRN: [id number] DOB:1951-04-10, 73 y.o., female Today's Date: 07/11/2024  END OF SESSION:  PT End of Session - 07/11/24 1049     Visit Number 21    Number of Visits 24    Date for PT Re-Evaluation 08/21/24    Authorization Type MCR    Progress Note Due on Visit 20    PT Start Time 1045    PT Stop Time 1130    PT Time Calculation (min) 45 min    Activity Tolerance Patient tolerated treatment well    Behavior During Therapy [facility] for tasks assessed/performed              Past Medical History:  Diagnosis Date   Allergy    seasonal allergy   Arthritis    left hip   Asthma    Depression    HSV infection    vag   Past Surgical History:  Procedure Laterality Date   APPENDECTOMY  11/17/1982   EYE SURGERY Left    left vitreous   LEG SURGERY Left 01/10/2024   TOTAL HIP ARTHROPLASTY Left 10/17/2014   Procedure: LEFT TOTAL HIP ARTHROPLASTY ANTERIOR APPROACH;  Surgeon: Donnice JONETTA Car, MD;  Location: [facility];  Service: Orthopedics;  Laterality: Left;   Patient Active Problem List   Diagnosis Date Noted   Osteoporosis without current pathological fracture 10/12/2023   Moderate persistent asthma 08/10/2023   Bronchitis 06/18/2021   Leukocytosis 06/17/2021   Environmental and seasonal allergies 10/07/2018   Chronic sinusitis 10/07/2018   H/O sinus surgery 10/07/2018   Hyperlipidemia 10/07/2018   S/P left THA, AA 10/17/2014   Recurrent HSV (herpes simplex virus) 04/21/2013   Depression, recurrent (HCC) 02/20/2010   Allergic rhinitis 04/16/2007    PCP: Veronica Barrett   REFERRING PROVIDER: Chandra Toribio POUR, MD  REFERRING DIAG: [phone] (ICD-10-CM) - Closed fracture of left femur with routine healing, unspecified fracture morphology, unspecified portion of femur, subsequent encounter  THERAPY DIAG:  Muscle weakness (generalized)  Other abnormalities of gait and mobility  Unsteadiness on  feet  Rationale for Evaluation and Treatment: Rehabilitation  ONSET DATE: 01/10/24 DOS  SUBJECTIVE:   SUBJECTIVE STATEMENT: Has done well w/o cane.  No hip pain/discomfort cited just irritated by limp.  PERTINENT HISTORY: HPI: Veronica Barrett 73 y.o. female with past medical history of moderate persistent asthma, hyperlipidemia and depression who presented to the emergency department after a mechanical fall. Patient's fall led to direct strike of her left knee to the ground. Patient noted severe pain and inability to ambulate at that time. Imaging revealed a left distal femoral shaft fracture   Patient was hospitalized for evaluation and management of left distal 3rd femur shaft fracture. Orthopedic surgery evaluated in consultation and she underwent left femur ORIF on 01/10/2024. She has been evaluated by PT/OT postoperatively with recommendations for acute rehab. Pt is medically stable and has been evaluated and found to be appropriate for [facility] and admitted.  PAIN:  Are you having pain? Yes: NPRS scale: 3/10 Pain location: L thigh Pain description: ache Aggravating factors: stairs, prolonged activity Relieving factors: rest   PRECAUTIONS: Fall  RED FLAGS: None   WEIGHT BEARING RESTRICTIONS: No  FALLS:  Has patient fallen in last 6 months? No  OCCUPATION: retired  PLOF: Independent with household mobility with device  PATIENT GOALS: To manage my symptoms and wean of the cane  NEXT MD VISIT: None scheduled at this  time  OBJECTIVE:  Note: Objective measures were completed at Evaluation unless otherwise noted.  DIAGNOSTIC FINDINGS: none available  PATIENT SURVEYS:  LEFS  06-01-24 LEFS 45/80 56.3% 06/20/24 46 58% perceived function MUSCLE LENGTH: Hamstrings: WFL Thomas test: No restriction  POSTURE: No Significant postural limitations  PALPATION: negative  LOWER EXTREMITY ROM:  Active ROM Right eval Left eval 06/20/24 L  Hip flexion   132d  Hip extension     Hip  abduction     Hip adduction     Hip internal rotation     Hip external rotation     Knee flexion 125d 118d   Knee extension 0d 0d   Ankle dorsiflexion     Ankle plantarflexion     Ankle inversion     Ankle eversion      (Blank rows = not tested)  LOWER EXTREMITY MMT:  MMT Right eval Left eval  Hip flexion    Hip extension    Hip abduction  4-  Hip adduction    Hip internal rotation    Hip external rotation    Knee flexion  4-  Knee extension  4-  Ankle dorsiflexion    Ankle plantarflexion    Ankle inversion    Ankle eversion     (Blank rows = not tested)  LOWER EXTREMITY SPECIAL TESTS:  deferred  FUNCTIONAL TESTS:  30 seconds chair stand test 9 reps w/o Ues; 06/20/24 10 reps  GAIT: Distance walked: 28ftx2 Assistive device utilized: Single point cane Level of assistance: Complete Independence Comments: unremarkable                                                                                                                                TREATMENT:  [facility] Adult PT Treatment:                                                DATE: 07/11/24 Therapeutic Exercise: Nustep L8 8 min Neuromuscular re-ed: Cable walks 4 way 17# 5x Runners step 6 in 10# KB 15/15 Therapeutic Activity: L hip flexor stretch 5# 60s x2 Gastroc stretch on slant board 60s x2 Leg press 45# B, 25# R, 20# 15x ea   [facility] Adult PT Treatment:                                                DATE: 06/29/24 Pt enters building ambulating independently with SPC. Treatment took place in water 3.8 to  4 ft 8 in. deep depending upon activity.  Pt entered and exited the pool via stair and handrails independently. Temperature 92 degrees.  Fins used for extra resistance   Exercises Walking forwards/backwards/sidestepping x2 laps  ea Standing Hip Abduction Adduction at Pool Wall x20 BIL Hip abd circles SL heel raises x20 Yellow noodle stomp Lateral walking with band at toes Sit to stand from bench Step up  fwd and lat High Knee Balance with Forward Walking x2 laps retro  Pt requires the buoyancy of water for active assisted exercises with buoyancy supported for strengthening and AROM exercises. Hydrostatic pressure also supports joints by unweighting joint load by at least 50 % in 3-4 feet depth water. 80% in chest to neck deep water. Water will provide assistance with movement using the current and laminar flow while the buoyancy reduces weight bearing. Pt requires the viscosity of the water for resistance with strengthening exercises.  [facility] Adult PT Treatment:                                                DATE: 07/04/24 Therapeutic Exercise: Nustep L8 8 min Neuromuscular re-ed: Cable walks 4 way 13# 5x Runners step 6 in 5# KB 15/15 Therapeutic Activity: L hip flexor stretch 5# 60s x2 Gastroc stretch on slant board 60s x2 Leg press 45# B, 25# R, 20# 15x ea     PATIENT EDUCATION:  Education details: Discussed eval findings, rehab rationale and POC and patient is in agreement  Person educated: Patient Education method: Explanation and Handouts Education comprehension: verbalized understanding and needs further education  HOME EXERCISE PROGRAM: Access Code: [email or web address] URL: [email or web address] Date: 04/20/2024 Prepared by: Reyes Kohut  Exercises - Supine Quad Set  - 2-3 x daily - 5 x weekly - 1 sets - 10 reps - 3s hold - Small Range Straight Leg Raise  - 2-3 x daily - 5 x weekly - 1 sets - 10 reps - Clamshell with Resistance  - 2-3 x daily - 5 x weekly - 1 sets - 10 reps Aquatic HEP Access Code: [email or web address] URL: [email or web address] Date: 06/07/2024 Prepared by: Graydon Dingwall   ASSESSMENT:  CLINICAL IMPRESSION:  Continued to focus on L hip extensor strengthening.  Emphasized step through pattern with cable walks. Increased KB weight to challenge strength and balance on stepping tasks.  L hip extensor weakness evident with runners step  task.  EVAL- Patient is a 73 y.o. female who was seen today for physical therapy evaluation and treatment for L LE weakness and dysfunction following fracture and ORIF 01/10/24.  Strength deficits note with 30s chair stand test, no leg length identified, extensor lag observed with SLR, L abduction strength limited.  Patient is a good candidate for OPPT to resolve strength, balance and ROM deficits and wean off cane when appropriate  OBJECTIVE IMPAIRMENTS: Abnormal gait, decreased activity tolerance, decreased balance, decreased endurance, decreased mobility, difficulty walking, decreased ROM, decreased strength, and pain.   ACTIVITY LIMITATIONS: carrying, lifting, standing, squatting, and stairs  PERSONAL FACTORS: Age, Past/current experiences, and Time since onset of injury/illness/exacerbation are also affecting patient's functional outcome.   REHAB POTENTIAL: Good  CLINICAL DECISION MAKING: Stable/uncomplicated  EVALUATION COMPLEXITY: Low   GOALS: Goals reviewed with patient? No  SHORT TERM GOALS: Target date: 05/11/2024   Patient to demonstrate independence in HEP  Baseline: [email or web address] Goal status: Met  2.  Assess 2 MWT Baseline: TBD; 05/16/24 370ft w/o cane Goal status: Met   LONG TERM GOALS: Target date: 06/01/2024    Patient will increase 30s chair stand reps from 9 to 12 without arms  to demonstrate and improved functional ability with less pain/difficulty as well as reduce fall risk.  Baseline: 9 06-01-24  8  with wt bear to the Right 06/20/24 10 Goal status: ONGOING  2.  Patient will acknowledge 2/10 pain at least once during episode of care   Baseline: 4/10 06-01-24  2/10 but improving 06/20/24 1-2/10 Goal status: Met  3.  Patient will score at least 60/80 on LEFS to signify clinically meaningful improvement in functional abilities.   Baseline: 36/80 06-01-24  45/60 06/20/24 46/80 Goal status: ONGOING  4.  Increase L hip and knee strength to 4/5 in deficit  regions Baseline: 4-/5 knee and hip abduction strength Goal status: ONGOING  5.  125d L knee flexion Baseline: 118d L knee flexion Goal status: ONGOING  6.  Patient to negotiate 16 steps with most appropriate pattern Baseline: TBD 06/20/24 16 steps with B rails and step through pattern Goal status: ONGOING   PLAN:  PT FREQUENCY: 1x/week  PT DURATION: 4 weeks  PLANNED INTERVENTIONS: 97110-Therapeutic exercises, 97530- Therapeutic activity, 97112- Neuromuscular re-education, 97535- Self Care, 02859- Manual therapy, [phone]- Gait training, [phone]- Aquatic Therapy, Patient/Family education, Balance training, and Stair training  PLAN FOR NEXT SESSION: HEP review and update, manual techniques as appropriate, aerobic tasks, ROM and flexibility activities, strengthening and PREs, TPDN, gait and balance training as needed     Reyes CHRISTELLA Kohut PT  07/11/24 11:32 AM Phone: [phone] Fax: [phone]

## 2024-07-19 NOTE — Therapy (Unsigned)
 PHYSICAL THERAPY TREATMENT NOTE      Patient Name: Veronica Barrett MRN: [id number] DOB:1951-01-21, 73 y.o., female Today's Date: 07/20/2024  END OF SESSION:  PT End of Session - 07/20/24 0918     Visit Number 22    Number of Visits 24    Date for PT Re-Evaluation 08/21/24    Authorization Type MCR    Progress Note Due on Visit 20    PT Start Time 0915    PT Stop Time 0955    PT Time Calculation (min) 40 min    Activity Tolerance Patient tolerated treatment well    Behavior During Therapy [facility] for tasks assessed/performed               Past Medical History:  Diagnosis Date   Allergy    seasonal allergy   Arthritis    left hip   Asthma    Depression    HSV infection    vag   Past Surgical History:  Procedure Laterality Date   APPENDECTOMY  11/17/1982   EYE SURGERY Left    left vitreous   LEG SURGERY Left 01/10/2024   TOTAL HIP ARTHROPLASTY Left 10/17/2014   Procedure: LEFT TOTAL HIP ARTHROPLASTY ANTERIOR APPROACH;  Surgeon: Donnice JONETTA Car, MD;  Location: [facility];  Service: Orthopedics;  Laterality: Left;   Patient Active Problem List   Diagnosis Date Noted   Osteoporosis without current pathological fracture 10/12/2023   Moderate persistent asthma 08/10/2023   Bronchitis 06/18/2021   Leukocytosis 06/17/2021   Environmental and seasonal allergies 10/07/2018   Chronic sinusitis 10/07/2018   H/O sinus surgery 10/07/2018   Hyperlipidemia 10/07/2018   S/P left THA, AA 10/17/2014   Recurrent HSV (herpes simplex virus) 04/21/2013   Depression, recurrent (HCC) 02/20/2010   Allergic rhinitis 04/16/2007    PCP: Veronica Barrett LABOR, PA   REFERRING PROVIDER: Chandra Toribio POUR, MD  REFERRING DIAG: [phone] (ICD-10-CM) - Closed fracture of left femur with routine healing, unspecified fracture morphology, unspecified portion of femur, subsequent encounter  THERAPY DIAG:  Muscle weakness (generalized)  Other abnormalities of gait and mobility  Unsteadiness on  feet  Rationale for Evaluation and Treatment: Rehabilitation  ONSET DATE: 01/10/24 DOS  SUBJECTIVE:   SUBJECTIVE STATEMENT: Main concern is continued soreness  in L hip.  PERTINENT HISTORY: HPI: Veronica Barrett 73 y.o. female with past medical history of moderate persistent asthma, hyperlipidemia and depression who presented to the emergency department after a mechanical fall. Patient's fall led to direct strike of her left knee to the ground. Patient noted severe pain and inability to ambulate at that time. Imaging revealed a left distal femoral shaft fracture   Patient was hospitalized for evaluation and management of left distal 3rd femur shaft fracture. Orthopedic surgery evaluated in consultation and she underwent left femur ORIF on 01/10/2024. She has been evaluated by PT/OT postoperatively with recommendations for acute rehab. Pt is medically stable and has been evaluated and found to be appropriate for [facility] and admitted.  PAIN:  Are you having pain? Yes: NPRS scale: 3/10 Pain location: L thigh Pain description: ache Aggravating factors: stairs, prolonged activity Relieving factors: rest   PRECAUTIONS: Fall  RED FLAGS: None   WEIGHT BEARING RESTRICTIONS: No  FALLS:  Has patient fallen in last 6 months? No  OCCUPATION: retired  PLOF: Independent with household mobility with device  PATIENT GOALS: To manage my symptoms and wean of the cane  NEXT MD VISIT: None scheduled at this time  OBJECTIVE:  Note: Objective measures were completed at Evaluation unless otherwise noted.  DIAGNOSTIC FINDINGS: none available  PATIENT SURVEYS:  LEFS  06-01-24 LEFS 45/80 56.3% 06/20/24 46 58% perceived function MUSCLE LENGTH: Hamstrings: WFL Thomas test: No restriction  POSTURE: No Significant postural limitations  PALPATION: negative  LOWER EXTREMITY ROM:  Active ROM Right eval Left eval 06/20/24 L  Hip flexion   132d  Hip extension     Hip abduction     Hip adduction      Hip internal rotation     Hip external rotation     Knee flexion 125d 118d   Knee extension 0d 0d   Ankle dorsiflexion     Ankle plantarflexion     Ankle inversion     Ankle eversion      (Blank rows = not tested)  LOWER EXTREMITY MMT:  MMT Right eval Left eval  Hip flexion    Hip extension    Hip abduction  4-  Hip adduction    Hip internal rotation    Hip external rotation    Knee flexion  4-  Knee extension  4-  Ankle dorsiflexion    Ankle plantarflexion    Ankle inversion    Ankle eversion     (Blank rows = not tested)  LOWER EXTREMITY SPECIAL TESTS:  deferred  FUNCTIONAL TESTS:  30 seconds chair stand test 9 reps w/o Ues; 06/20/24 10 reps  GAIT: Distance walked: 74ftx2 Assistive device utilized: Single point cane Level of assistance: Complete Independence Comments: unremarkable                                                                                                                                TREATMENT:  [facility] Adult PT Treatment:                                                DATE: 07/20/24 Therapeutic Exercise: Nustep L8 8 min Neuromuscular re-ed: Cable walks 4 way 17# 5x Runners step 6 in 10# KB 15/15 Therapeutic Activity: L hip flexor stretch 5# 60s x2 Gastroc stretch on slant board 60s x2 Leg press 50# B, 30# R, 25# 15x ea  [facility] Adult PT Treatment:                                                DATE: 07/11/24 Therapeutic Exercise: Nustep L8 8 min Neuromuscular re-ed: Cable walks 4 way 17# 5x Runners step 6 in 10# KB 15/15 Therapeutic Activity: L hip flexor stretch 5# 60s x2 Gastroc stretch on slant board 60s x2 Leg press 45# B, 25# R, 20# 15x ea   [facility] Adult PT Treatment:  DATE: 06/29/24 Pt enters building ambulating independently with SPC. Treatment took place in water 3.8 to  4 ft 8 in. deep depending upon activity.  Pt entered and exited the pool via stair and handrails independently.  Temperature 92 degrees.  Fins used for extra resistance   Exercises Walking forwards/backwards/sidestepping x2 laps ea Standing Hip Abduction Adduction at [employer] x20 BIL Hip abd circles SL heel raises x20 Yellow noodle stomp Lateral walking with band at toes Sit to stand from bench Step up fwd and lat High Knee Balance with Forward Walking x2 laps retro  Pt requires the buoyancy of water for active assisted exercises with buoyancy supported for strengthening and AROM exercises. Hydrostatic pressure also supports joints by unweighting joint load by at least 50 % in 3-4 feet depth water. 80% in chest to neck deep water. Water will provide assistance with movement using the current and laminar flow while the buoyancy reduces weight bearing. Pt requires the viscosity of the water for resistance with strengthening exercises.  [facility] Adult PT Treatment:                                                DATE: 07/04/24 Therapeutic Exercise: Nustep L8 8 min Neuromuscular re-ed: Cable walks 4 way 13# 5x Runners step 6 in 5# KB 15/15 Therapeutic Activity: L hip flexor stretch 5# 60s x2 Gastroc stretch on slant board 60s x2 Leg press 45# B, 25# R, 20# 15x ea     PATIENT EDUCATION:  Education details: Discussed eval findings, rehab rationale and POC and patient is in agreement  Person educated: Patient Education method: Explanation and Handouts Education comprehension: verbalized understanding and needs further education  HOME EXERCISE PROGRAM: Access Code: [email or web address] URL: [email or web address] Date: 04/20/2024 Prepared by: Reyes Kohut  Exercises - Supine Quad Set  - 2-3 x daily - 5 x weekly - 1 sets - 10 reps - 3s hold - Small Range Straight Leg Raise  - 2-3 x daily - 5 x weekly - 1 sets - 10 reps - Clamshell with Resistance  - 2-3 x daily - 5 x weekly - 1 sets - 10 reps Aquatic HEP Access Code: [email or web address] URL: [email or web address] Date:  06/07/2024 Prepared by: Graydon Dingwall   ASSESSMENT:  CLINICAL IMPRESSION:  Discussed post PT plans for strength and maintenance of current function as well as need to f/u with surgeon if symptoms persist.  Increased weight on leg press and step height on runners step for added challenge.  Continues to struggle negotiating 8 in step.  EVAL- Patient is a 73 y.o. female who was seen today for physical therapy evaluation and treatment for L LE weakness and dysfunction following fracture and ORIF 01/10/24.  Strength deficits note with 30s chair stand test, no leg length identified, extensor lag observed with SLR, L abduction strength limited.  Patient is a good candidate for OPPT to resolve strength, balance and ROM deficits and wean off cane when appropriate  OBJECTIVE IMPAIRMENTS: Abnormal gait, decreased activity tolerance, decreased balance, decreased endurance, decreased mobility, difficulty walking, decreased ROM, decreased strength, and pain.   ACTIVITY LIMITATIONS: carrying, lifting, standing, squatting, and stairs  PERSONAL FACTORS: Age, Past/current experiences, and Time since onset of injury/illness/exacerbation are also affecting patient's functional outcome.   REHAB POTENTIAL: Good  CLINICAL DECISION MAKING: Stable/uncomplicated  EVALUATION COMPLEXITY: Low   GOALS: Goals reviewed with patient?  No  SHORT TERM GOALS: Target date: 05/11/2024   Patient to demonstrate independence in HEP  Baseline: [email or web address] Goal status: Met  2.  Assess 2 MWT Baseline: TBD; 05/16/24 325ft w/o cane Goal status: Met   LONG TERM GOALS: Target date: 08/21/2024    Patient will increase 30s chair stand reps from 9 to 12 without arms to demonstrate and improved functional ability with less pain/difficulty as well as reduce fall risk.  Baseline: 9 06-01-24  8  with wt bear to the Right 06/20/24 10 Goal status: ONGOING  2.  Patient will acknowledge 2/10 pain at least once during episode of care    Baseline: 4/10 06-01-24  2/10 but improving 06/20/24 1-2/10 Goal status: Met  3.  Patient will score at least 60/80 on LEFS to signify clinically meaningful improvement in functional abilities.   Baseline: 36/80 06-01-24  45/60 06/20/24 46/80 Goal status: ONGOING  4.  Increase L hip and knee strength to 4/5 in deficit regions Baseline: 4-/5 knee and hip abduction strength Goal status: ONGOING  5.  125d L knee flexion Baseline: 118d L knee flexion Goal status: ONGOING  6.  Patient to negotiate 16 steps with most appropriate pattern Baseline: TBD 06/20/24 16 steps with B rails and step through pattern Goal status: ONGOING   PLAN:  PT FREQUENCY: 1x/week  PT DURATION: 4 weeks  PLANNED INTERVENTIONS: 97110-Therapeutic exercises, 97530- Therapeutic activity, 97112- Neuromuscular re-education, 97535- Self Care, 02859- Manual therapy, [phone]- Gait training, [phone]- Aquatic Therapy, Patient/Family education, Balance training, and Stair training  PLAN FOR NEXT SESSION: HEP review and update, manual techniques as appropriate, aerobic tasks, ROM and flexibility activities, strengthening and PREs, TPDN, gait and balance training as needed     Reyes CHRISTELLA Kohut PT  07/20/24 9:56 AM Phone: [phone] Fax: [phone]

## 2024-07-20 ENCOUNTER — Ambulatory Visit: Attending: Family Medicine

## 2024-07-20 DIAGNOSIS — M6281 Muscle weakness (generalized): Secondary | ICD-10-CM | POA: Insufficient documentation

## 2024-07-20 DIAGNOSIS — R2689 Other abnormalities of gait and mobility: Secondary | ICD-10-CM | POA: Insufficient documentation

## 2024-07-20 DIAGNOSIS — R2681 Unsteadiness on feet: Secondary | ICD-10-CM | POA: Diagnosis not present

## 2024-07-25 ENCOUNTER — Other Ambulatory Visit: Payer: Self-pay

## 2024-07-25 ENCOUNTER — Ambulatory Visit

## 2024-07-25 DIAGNOSIS — R2689 Other abnormalities of gait and mobility: Secondary | ICD-10-CM

## 2024-07-25 DIAGNOSIS — F339 Major depressive disorder, recurrent, unspecified: Secondary | ICD-10-CM

## 2024-07-25 DIAGNOSIS — R2681 Unsteadiness on feet: Secondary | ICD-10-CM | POA: Diagnosis not present

## 2024-07-25 DIAGNOSIS — M6281 Muscle weakness (generalized): Secondary | ICD-10-CM | POA: Diagnosis not present

## 2024-07-25 NOTE — Therapy (Signed)
 PHYSICAL THERAPY TREATMENT NOTE      Patient Name: Veronica Barrett MRN: [id number] DOB:[birth year], 73 y.o., female Today's Date: 07/25/2024  END OF SESSION:  PT End of Session - 07/25/24 1011     Visit Number 23    Number of Visits 24    Date for PT Re-Evaluation 08/21/24    Authorization Type MCR    Progress Note Due on Visit 20    PT Start Time 1006    PT Stop Time 1045    PT Time Calculation (min) 39 min    Activity Tolerance Patient tolerated treatment well    Behavior During Therapy WFL for tasks assessed/performed                Past Medical History:  Diagnosis Date   Allergy    seasonal allergy   Arthritis    left hip   Asthma    Depression    HSV infection    vag   Past Surgical History:  Procedure Laterality Date   APPENDECTOMY  11/17/1982   EYE SURGERY Left    left vitreous   LEG SURGERY Left 01/10/2024   TOTAL HIP ARTHROPLASTY Left 10/17/2014   Procedure: LEFT TOTAL HIP ARTHROPLASTY ANTERIOR APPROACH;  Surgeon: Donnice JONETTA Car, MD;  Location: [facility];  Service: Orthopedics;  Laterality: Left;   Patient Active Problem List   Diagnosis Date Noted   Osteoporosis without current pathological fracture 10/12/2023   Moderate persistent asthma 08/10/2023   Bronchitis 06/18/2021   Leukocytosis 06/17/2021   Environmental and seasonal allergies 10/07/2018   Chronic sinusitis 10/07/2018   H/O sinus surgery 10/07/2018   Hyperlipidemia 10/07/2018   S/P left THA, AA 10/17/2014   Recurrent HSV (herpes simplex virus) 04/21/2013   Depression, recurrent (HCC) 02/20/2010   Allergic rhinitis 04/16/2007    PCP: Wallace Joesph LABOR, PA   REFERRING PROVIDER: Chandra Toribio POUR, MD  REFERRING DIAG: [phone] (ICD-10-CM) - Closed fracture of left femur with routine healing, unspecified fracture morphology, unspecified portion of femur, subsequent encounter  THERAPY DIAG:  Muscle weakness (generalized)  Other abnormalities of gait and mobility  Unsteadiness on  feet  Rationale for Evaluation and Treatment: Rehabilitation  ONSET DATE: 01/10/24 DOS  SUBJECTIVE:   SUBJECTIVE STATEMENT: L hip soreness persists but able to manage all ADLs and mobility requirements w/o restriction  PERTINENT HISTORY: HPI: Veronica Barrett 73 y.o. female with past medical history of moderate persistent asthma, hyperlipidemia and depression who presented to the emergency department after a mechanical fall. Patient's fall led to direct strike of her left knee to the ground. Patient noted severe pain and inability to ambulate at that time. Imaging revealed a left distal femoral shaft fracture   Patient was hospitalized for evaluation and management of left distal 3rd femur shaft fracture. Orthopedic surgery evaluated in consultation and she underwent left femur ORIF on 01/10/2024. She has been evaluated by PT/OT postoperatively with recommendations for acute rehab. Pt is medically stable and has been evaluated and found to be appropriate for [facility] and admitted.  PAIN:  Are you having pain? Yes: NPRS scale: 3/10 Pain location: L thigh Pain description: ache Aggravating factors: stairs, prolonged activity Relieving factors: rest   PRECAUTIONS: Fall  RED FLAGS: None   WEIGHT BEARING RESTRICTIONS: No  FALLS:  Has patient fallen in last 6 months? No  OCCUPATION: retired  PLOF: Independent with household mobility with device  PATIENT GOALS: To manage my symptoms and wean of the cane  NEXT MD VISIT: None  scheduled at this time  OBJECTIVE:  Note: Objective measures were completed at Evaluation unless otherwise noted.  DIAGNOSTIC FINDINGS: none available  PATIENT SURVEYS:  LEFS  06-01-24 LEFS 45/80 56.3% 06/20/24 46 58% perceived function MUSCLE LENGTH: Hamstrings: WFL Thomas test: No restriction  POSTURE: No Significant postural limitations  PALPATION: negative  LOWER EXTREMITY ROM:  Active ROM Right eval Left eval 06/20/24 L  Hip flexion   132d  Hip  extension     Hip abduction     Hip adduction     Hip internal rotation     Hip external rotation     Knee flexion 125d 118d   Knee extension 0d 0d   Ankle dorsiflexion     Ankle plantarflexion     Ankle inversion     Ankle eversion      (Blank rows = not tested)  LOWER EXTREMITY MMT:  MMT Right eval Left eval  Hip flexion    Hip extension    Hip abduction  4-  Hip adduction    Hip internal rotation    Hip external rotation    Knee flexion  4-  Knee extension  4-  Ankle dorsiflexion    Ankle plantarflexion    Ankle inversion    Ankle eversion     (Blank rows = not tested)  LOWER EXTREMITY SPECIAL TESTS:  deferred  FUNCTIONAL TESTS:  30 seconds chair stand test 9 reps w/o Ues; 06/20/24 10 reps  GAIT: Distance walked: 36ftx2 Assistive device utilized: Single point cane Level of assistance: Complete Independence Comments: unremarkable                                                                                                                                TREATMENT:  [facility] Adult PT Treatment:                                                DATE: 07/25/24 Therapeutic Exercise: Nustep L 8 8 min Neuromuscular re-ed: Cable walks 4 way 20# 5x Runners step 6 in 10# KB 15/15 Therapeutic Activity: L hip standing psoas stretch 60s x2 Gastroc stretch on slant board 60s x2 Leg press 55# B, 35# R, 25# 15x ea  [facility] Adult PT Treatment:                                                DATE: 07/20/24 Therapeutic Exercise: Nustep L8 8 min Neuromuscular re-ed: Cable walks 4 way 17# 5x Runners step 6 in 10# KB 15/15 Therapeutic Activity: L hip flexor stretch 5# 60s x2 Gastroc stretch on slant board 60s x2 Leg press 50# B, 30# R, 25# 15x ea  [facility]  Adult PT Treatment:                                                DATE: 07/11/24 Therapeutic Exercise: Nustep L8 8 min Neuromuscular re-ed: Cable walks 4 way 17# 5x Runners step 6 in 10# KB 15/15 Therapeutic Activity: L hip  flexor stretch 5# 60s x2 Gastroc stretch on slant board 60s x2 Leg press 45# B, 25# R, 20# 15x ea   [facility] Adult PT Treatment:                                                DATE: 06/29/24 Pt enters building ambulating independently with SPC. Treatment took place in water 3.8 to  4 ft 8 in. deep depending upon activity.  Pt entered and exited the pool via stair and handrails independently. Temperature 92 degrees.  Fins used for extra resistance   Exercises Walking forwards/backwards/sidestepping x2 laps ea Standing Hip Abduction Adduction at [employer] x20 BIL Hip abd circles SL heel raises x20 Yellow noodle stomp Lateral walking with band at toes Sit to stand from bench Step up fwd and lat High Knee Balance with Forward Walking x2 laps retro  Pt requires the buoyancy of water for active assisted exercises with buoyancy supported for strengthening and AROM exercises. Hydrostatic pressure also supports joints by unweighting joint load by at least 50 % in 3-4 feet depth water. 80% in chest to neck deep water. Water will provide assistance with movement using the current and laminar flow while the buoyancy reduces weight bearing. Pt requires the viscosity of the water for resistance with strengthening exercises.  [facility] Adult PT Treatment:                                                DATE: 07/04/24 Therapeutic Exercise: Nustep L8 8 min Neuromuscular re-ed: Cable walks 4 way 13# 5x Runners step 6 in 5# KB 15/15 Therapeutic Activity: L hip flexor stretch 5# 60s x2 Gastroc stretch on slant board 60s x2 Leg press 45# B, 25# R, 20# 15x ea     PATIENT EDUCATION:  Education details: Discussed eval findings, rehab rationale and POC and patient is in agreement  Person educated: Patient Education method: Explanation and Handouts Education comprehension: verbalized understanding and needs further education  HOME EXERCISE PROGRAM: Access Code: [email or web address] URL:  [email or web address] Date: 04/20/2024 Prepared by: Reyes Kohut  Exercises - Supine Quad Set  - 2-3 x daily - 5 x weekly - 1 sets - 10 reps - 3s hold - Small Range Straight Leg Raise  - 2-3 x daily - 5 x weekly - 1 sets - 10 reps - Clamshell with Resistance  - 2-3 x daily - 5 x weekly - 1 sets - 10 reps Aquatic HEP Access Code: [email or web address] URL: [email or web address] Date: 06/07/2024 Prepared by: Graydon Dingwall   ASSESSMENT:  CLINICAL IMPRESSION:  Continued to challenge patient with increased weight and resistance.  Incorporated standing psoas/hip flexor stretch.  EVAL- Patient is a 73 y.o. female who was seen today for physical therapy evaluation and treatment for L LE weakness  and dysfunction following fracture and ORIF 01/10/24.  Strength deficits note with 30s chair stand test, no leg length identified, extensor lag observed with SLR, L abduction strength limited.  Patient is a good candidate for OPPT to resolve strength, balance and ROM deficits and wean off cane when appropriate  OBJECTIVE IMPAIRMENTS: Abnormal gait, decreased activity tolerance, decreased balance, decreased endurance, decreased mobility, difficulty walking, decreased ROM, decreased strength, and pain.   ACTIVITY LIMITATIONS: carrying, lifting, standing, squatting, and stairs  PERSONAL FACTORS: Age, Past/current experiences, and Time since onset of injury/illness/exacerbation are also affecting patient's functional outcome.   REHAB POTENTIAL: Good  CLINICAL DECISION MAKING: Stable/uncomplicated  EVALUATION COMPLEXITY: Low   GOALS: Goals reviewed with patient? No  SHORT TERM GOALS: Target date: 05/11/2024   Patient to demonstrate independence in HEP  Baseline: [email or web address] Goal status: Met  2.  Assess 2 MWT Baseline: TBD; 05/16/24 316ft w/o cane Goal status: Met   LONG TERM GOALS: Target date: 08/21/2024    Patient will increase 30s chair stand reps from 9 to 12 without  arms to demonstrate and improved functional ability with less pain/difficulty as well as reduce fall risk.  Baseline: 9 06-01-24  8  with wt bear to the Right 06/20/24 10 Goal status: ONGOING  2.  Patient will acknowledge 2/10 pain at least once during episode of care   Baseline: 4/10 06-01-24  2/10 but improving 06/20/24 1-2/10 Goal status: Met  3.  Patient will score at least 60/80 on LEFS to signify clinically meaningful improvement in functional abilities.   Baseline: 36/80 06-01-24  45/60 06/20/24 46/80 Goal status: ONGOING  4.  Increase L hip and knee strength to 4/5 in deficit regions Baseline: 4-/5 knee and hip abduction strength Goal status: ONGOING  5.  125d L knee flexion Baseline: 118d L knee flexion Goal status: ONGOING  6.  Patient to negotiate 16 steps with most appropriate pattern Baseline: TBD 06/20/24 16 steps with B rails and step through pattern Goal status: ONGOING   PLAN:  PT FREQUENCY: 1x/week  PT DURATION: 4 weeks  PLANNED INTERVENTIONS: 97110-Therapeutic exercises, 97530- Therapeutic activity, 97112- Neuromuscular re-education, 97535- Self Care, 02859- Manual therapy, [phone]- Gait training, [phone]- Aquatic Therapy, Patient/Family education, Balance training, and Stair training  PLAN FOR NEXT SESSION: HEP review and update, manual techniques as appropriate, aerobic tasks, ROM and flexibility activities, strengthening and PREs, TPDN, gait and balance training as needed     Reyes CHRISTELLA Kohut PT  07/25/24 10:46 AM Phone: [phone] Fax: [phone]

## 2024-07-26 ENCOUNTER — Other Ambulatory Visit: Payer: Self-pay | Admitting: Family Medicine

## 2024-07-26 ENCOUNTER — Other Ambulatory Visit: Payer: Self-pay

## 2024-07-26 DIAGNOSIS — J4540 Moderate persistent asthma, uncomplicated: Secondary | ICD-10-CM

## 2024-07-26 DIAGNOSIS — F339 Major depressive disorder, recurrent, unspecified: Secondary | ICD-10-CM

## 2024-07-31 NOTE — Therapy (Signed)
 PHYSICAL THERAPY TREATMENT NOTE/DISCHARGE SUMMARY      Patient Name: Veronica Barrett MRN: [id number] DOB:Oct 06, 1951, 73 y.o., female Today's Date: 08/01/2024  PHYSICAL THERAPY DISCHARGE SUMMARY  Visits from Start of Care: 24  Current functional level related to goals / functional outcomes: [place]   Remaining deficits: weakness   Education / Equipment: HEP   Patient agrees to discharge. Patient goals were met. Patient is being discharged due to being pleased with the current functional level.   END OF SESSION:  PT End of Session - 08/01/24 1004     Visit Number 24    Number of Visits 24    Date for PT Re-Evaluation 08/21/24    Authorization Type MCR    Progress Note Due on Visit 20    PT Start Time 1000    PT Stop Time 1040    PT Time Calculation (min) 40 min    Activity Tolerance Patient tolerated treatment well    Behavior During Therapy WFL for tasks assessed/performed                 Past Medical History:  Diagnosis Date   Allergy    seasonal allergy   Arthritis    left hip   Asthma    Depression    HSV infection    vag   Past Surgical History:  Procedure Laterality Date   APPENDECTOMY  11/17/1982   EYE SURGERY Left    left vitreous   LEG SURGERY Left 01/10/2024   TOTAL HIP ARTHROPLASTY Left 10/17/2014   Procedure: LEFT TOTAL HIP ARTHROPLASTY ANTERIOR APPROACH;  Surgeon: Veronica JONETTA Car, MD;  Location: [facility];  Service: Orthopedics;  Laterality: Left;   Patient Active Problem List   Diagnosis Date Noted   Osteoporosis without current pathological fracture 10/12/2023   Moderate persistent asthma 08/10/2023   Bronchitis 06/18/2021   Leukocytosis 06/17/2021   Environmental and seasonal allergies 10/07/2018   Chronic sinusitis 10/07/2018   H/O sinus surgery 10/07/2018   Hyperlipidemia 10/07/2018   S/P left THA, AA 10/17/2014   Recurrent HSV (herpes simplex virus) 04/21/2013   Depression, recurrent (HCC) 02/20/2010   Allergic rhinitis  04/16/2007    PCP: Veronica Barrett LABOR, PA   REFERRING PROVIDER: Chandra Toribio POUR, MD  REFERRING DIAG: [phone] (ICD-10-CM) - Closed fracture of left femur with routine healing, unspecified fracture morphology, unspecified portion of femur, subsequent encounter  THERAPY DIAG:  Muscle weakness (generalized)  Other abnormalities of gait and mobility  Unsteadiness on feet  Rationale for Evaluation and Treatment: Rehabilitation  ONSET DATE: 01/10/24 DOS  SUBJECTIVE:   SUBJECTIVE STATEMENT: Rates L hip function at 80%, soreness has remained at 1-2/10 but has also been more active.  PERTINENT HISTORY: HPI: Veronica Barrett 73 y.o. female with past medical history of moderate persistent asthma, hyperlipidemia and depression who presented to the emergency department after a mechanical fall. Patient's fall led to direct strike of her left knee to the ground. Patient noted severe pain and inability to ambulate at that time. Imaging revealed a left distal femoral shaft fracture   Patient was hospitalized for evaluation and management of left distal 3rd femur shaft fracture. Orthopedic surgery evaluated in consultation and she underwent left femur ORIF on 01/10/2024. She has been evaluated by PT/OT postoperatively with recommendations for acute rehab. Pt is medically stable and has been evaluated and found to be appropriate for [facility] and admitted.  PAIN:  Are you having pain? Yes: NPRS scale: 3/10 Pain location: L thigh Pain description:  ache Aggravating factors: stairs, prolonged activity Relieving factors: rest   PRECAUTIONS: Fall  RED FLAGS: None   WEIGHT BEARING RESTRICTIONS: No  FALLS:  Has patient fallen in last 6 months? No  OCCUPATION: retired  PLOF: Independent with household mobility with device  PATIENT GOALS: To manage my symptoms and wean of the cane  NEXT MD VISIT: None scheduled at this time  OBJECTIVE:  Note: Objective measures were completed at Evaluation unless  otherwise noted.  DIAGNOSTIC FINDINGS: none available  PATIENT SURVEYS:  LEFS  06-01-24 LEFS 45/80 56.3% 06/20/24 46 58% perceived function MUSCLE LENGTH: Hamstrings: WFL Thomas test: No restriction  POSTURE: No Significant postural limitations  PALPATION: negative  LOWER EXTREMITY ROM:  Active ROM Right eval Left eval 06/20/24 L  Hip flexion   132d  Hip extension     Hip abduction     Hip adduction     Hip internal rotation     Hip external rotation     Knee flexion 125d 118d   Knee extension 0d 0d   Ankle dorsiflexion     Ankle plantarflexion     Ankle inversion     Ankle eversion      (Blank rows = not tested)  LOWER EXTREMITY MMT:  MMT Right eval Left eval L 08/01/24  Hip flexion     Hip extension     Hip abduction  4- 4  Hip adduction     Hip internal rotation     Hip external rotation     Knee flexion  4- 4  Knee extension  4- 4  Ankle dorsiflexion     Ankle plantarflexion     Ankle inversion     Ankle eversion      (Blank rows = not tested)  LOWER EXTREMITY SPECIAL TESTS:  deferred  FUNCTIONAL TESTS:  30 seconds chair stand test 9 reps w/o Ues; 06/20/24 10 reps; 08/01/24 14 reps  GAIT: Distance walked: 94ftx2 Assistive device utilized: Single point cane Level of assistance: Complete Independence Comments: unremarkable                                                                                                                                TREATMENT:  [facility] Adult PT Treatment:                                                DATE: 08/01/24 Therapeutic Exercise: Nustep L4 8 min  Neuromuscular re-ed: Lateral step up/downs 4 in 10/10 Lateral side stepping against RTB 15 reps  Therapeutic Activity: Assessment of LEFS, ROM, strength and function  [facility] Adult PT Treatment:  DATE: 07/25/24 Therapeutic Exercise: Nustep L 8 8 min Neuromuscular re-ed: Cable walks 4 way 20# 5x Runners step 6 in 10# KB  15/15 Therapeutic Activity: L hip standing psoas stretch 60s x2 Gastroc stretch on slant board 60s x2 Leg press 55# B, 35# R, 25# 15x ea  [facility] Adult PT Treatment:                                                DATE: 07/20/24 Therapeutic Exercise: Nustep L8 8 min Neuromuscular re-ed: Cable walks 4 way 17# 5x Runners step 6 in 10# KB 15/15 Therapeutic Activity: L hip flexor stretch 5# 60s x2 Gastroc stretch on slant board 60s x2 Leg press 50# B, 30# R, 25# 15x ea      PATIENT EDUCATION:  Education details: Discussed eval findings, rehab rationale and POC and patient is in agreement  Person educated: Patient Education method: Explanation and Handouts Education comprehension: verbalized understanding and needs further education  HOME EXERCISE PROGRAM: Access Code: [email or web address] URL: [email or web address] Date: 08/01/2024 Prepared by: Reyes Kohut  Exercises - Clamshell with Resistance  - 1 x daily - 3 x weekly - 2 sets - 15 reps - Sit to Stand with Arms Crossed  - 1 x daily - 3 x weekly - 1 sets - 10 reps - Supine Piriformis Stretch with Foot on Ground  - 1 x daily - 3 x weekly - 1 sets - 2 reps - 30s hold - Lateral Step Up  - 1 x daily - 3 x weekly - 1 sets - 15 reps - Side Stepping with Resistance at Thighs  - 1 x daily - 3 x weekly - 1 sets - 15 reps   ASSESSMENT:  CLINICAL IMPRESSION:  Goals met and maximum function regained  EVAL- Patient is a 74 y.o. female who was seen today for physical therapy evaluation and treatment for L LE weakness and dysfunction following fracture and ORIF 01/10/24.  Strength deficits note with 30s chair stand test, no leg length identified, extensor lag observed with SLR, L abduction strength limited.  Patient is a good candidate for OPPT to resolve strength, balance and ROM deficits and wean off cane when appropriate  OBJECTIVE IMPAIRMENTS: Abnormal gait, decreased activity tolerance, decreased balance, decreased endurance,  decreased mobility, difficulty walking, decreased ROM, decreased strength, and pain.   ACTIVITY LIMITATIONS: carrying, lifting, standing, squatting, and stairs  PERSONAL FACTORS: Age, Past/current experiences, and Time since onset of injury/illness/exacerbation are also affecting patient's functional outcome.   REHAB POTENTIAL: Good  CLINICAL DECISION MAKING: Stable/uncomplicated  EVALUATION COMPLEXITY: Low   GOALS: Goals reviewed with patient? No  SHORT TERM GOALS: Target date: 05/11/2024   Patient to demonstrate independence in HEP  Baseline: [email or web address] Goal status: Met  2.  Assess 2 MWT Baseline: TBD; 05/16/24 328ft w/o cane Goal status: Met   LONG TERM GOALS: Target date: 08/21/2024    Patient will increase 30s chair stand reps from 9 to 12 without arms to demonstrate and improved functional ability with less pain/difficulty as well as reduce fall risk.  Baseline: 9 06-01-24  8  with wt bear to the Right 06/20/24 10 08/01/24 14 Goal status: MET  2.  Patient will acknowledge 2/10 pain at least once during episode of care   Baseline: 4/10 06-01-24  2/10 but improving 06/20/24 1-2/10 Goal status: Met  3.  Patient  will score at least 60/80 on LEFS to signify clinically meaningful improvement in functional abilities.   Baseline: 36/80 06-01-24  45/60 06/20/24 46/80 08/01/24 53/80 Goal status: Partially met  4.  Increase L hip and knee strength to 4/5 in deficit regions Baseline: 4-/5 knee and hip abduction strength MMT Right eval Left eval L 08/01/24  Hip flexion     Hip extension     Hip abduction  4- 4  Hip adduction     Hip internal rotation     Hip external rotation     Knee flexion  4- 4  Knee extension  4- 4   Goal status: Met  5.  125d L knee flexion Baseline: 118d L knee flexion; 08/01/24 125d Goal status: Met  6.  Patient to negotiate 16 steps with most appropriate pattern Baseline: TBD 06/20/24 16 steps with B rails and step through pattern Goal status:  MET   PLAN:  PT FREQUENCY: 1x/week  PT DURATION: 4 weeks  PLANNED INTERVENTIONS: 97110-Therapeutic exercises, 97530- Therapeutic activity, 97112- Neuromuscular re-education, 97535- Self Care, 02859- Manual therapy, [phone]- Gait training, [phone]- Aquatic Therapy, Patient/Family education, Balance training, and Stair training  PLAN FOR NEXT SESSION: DC to self management   Reyes CHRISTELLA Kohut PT  08/01/24 10:53 AM Phone: [phone] Fax: [phone]

## 2024-08-01 ENCOUNTER — Ambulatory Visit

## 2024-08-01 DIAGNOSIS — R2689 Other abnormalities of gait and mobility: Secondary | ICD-10-CM | POA: Diagnosis not present

## 2024-08-01 DIAGNOSIS — M6281 Muscle weakness (generalized): Secondary | ICD-10-CM | POA: Diagnosis not present

## 2024-08-01 DIAGNOSIS — R2681 Unsteadiness on feet: Secondary | ICD-10-CM

## 2024-08-08 ENCOUNTER — Ambulatory Visit (INDEPENDENT_AMBULATORY_CARE_PROVIDER_SITE_OTHER)

## 2024-08-08 VITALS — BP 111/67 | HR 63 | Temp 98.2°F | Ht 64.0 in | Wt 168.1 lb

## 2024-08-08 DIAGNOSIS — Z131 Encounter for screening for diabetes mellitus: Secondary | ICD-10-CM | POA: Diagnosis not present

## 2024-08-08 DIAGNOSIS — S7292XD Unspecified fracture of left femur, subsequent encounter for closed fracture with routine healing: Secondary | ICD-10-CM | POA: Diagnosis not present

## 2024-08-08 DIAGNOSIS — K625 Hemorrhage of anus and rectum: Secondary | ICD-10-CM | POA: Diagnosis not present

## 2024-08-08 DIAGNOSIS — K219 Gastro-esophageal reflux disease without esophagitis: Secondary | ICD-10-CM | POA: Diagnosis not present

## 2024-08-08 DIAGNOSIS — R252 Cramp and spasm: Secondary | ICD-10-CM | POA: Diagnosis not present

## 2024-08-08 DIAGNOSIS — J9621 Acute and chronic respiratory failure with hypoxia: Secondary | ICD-10-CM | POA: Insufficient documentation

## 2024-08-08 DIAGNOSIS — M810 Age-related osteoporosis without current pathological fracture: Secondary | ICD-10-CM

## 2024-08-08 DIAGNOSIS — D649 Anemia, unspecified: Secondary | ICD-10-CM

## 2024-08-08 DIAGNOSIS — Z6828 Body mass index (BMI) 28.0-28.9, adult: Secondary | ICD-10-CM

## 2024-08-08 DIAGNOSIS — J9601 Acute respiratory failure with hypoxia: Secondary | ICD-10-CM | POA: Insufficient documentation

## 2024-08-08 DIAGNOSIS — E782 Mixed hyperlipidemia: Secondary | ICD-10-CM

## 2024-08-08 DIAGNOSIS — D509 Iron deficiency anemia, unspecified: Secondary | ICD-10-CM | POA: Diagnosis not present

## 2024-08-08 DIAGNOSIS — Z23 Encounter for immunization: Secondary | ICD-10-CM | POA: Diagnosis not present

## 2024-08-08 MED ORDER — HYDROCORTISONE ACETATE 25 MG RE SUPP: 25.0000 mg | Freq: Two times a day (BID) | RECTAL | 1 refills | Status: AC | PRN

## 2024-08-08 NOTE — Assessment & Plan Note (Signed)
 Continue calcium  and vitamin D  supplementation. Now that she is healed from her leg fracture, will update DEXA scan to determine severity of osteoporosis. - Discuss treatment options including twice-yearly injections (Prolia) if scan shows worsening osteoporosis. - Continue calcium  and vitamin D  supplementation. - Will stay away from oral bisphosphonate due to previous intolerance.

## 2024-08-08 NOTE — Assessment & Plan Note (Signed)
 Chronic muscle cramps with associated tingling of feet at night time, possibly due to nerve conduction, circulation, or electrolyte imbalance. - Check electrolytes including magnesium , potassium, and calcium . - Order nerve conduction study if labs normal.

## 2024-08-08 NOTE — Assessment & Plan Note (Signed)
 Iron deficiency anemia post-surgery and s/p transfusion with concerns about iron-related constipation and stomach upset. - Check blood counts. - Consider adjusting iron supplementation to Monday, Wednesday, Friday instead of daily if blood counts are normal.

## 2024-08-08 NOTE — Assessment & Plan Note (Signed)
 Follows with pulmonology. Continue current Dulera , albuterol  inhalers as well as daily antihistamine and Singulair .

## 2024-08-08 NOTE — Assessment & Plan Note (Signed)
 Managed with pantoprazole  40 mg daily. If worsening, consider adding famotidine instead of maximizing PPI due to pre-existing osteoporosis.

## 2024-08-08 NOTE — Assessment & Plan Note (Signed)
 Intermittent bright red rectal bleeding likely from internal hemorrhoids. - Prescribe hydrocortisone  2% suppositories. - Advise use twice daily for five days, then as needed. - Monitor and report changes in bleeding pattern.

## 2024-08-08 NOTE — Patient Instructions (Signed)
 VISIT SUMMARY: Today, we discussed your ongoing recovery from a leg fracture, management of osteoporosis, asthma, GERD, iron deficiency anemia, muscle cramps, and rectal bleeding. We reviewed your current medications and made some adjustments to better manage your symptoms and improve your overall health.  YOUR PLAN: -OSTEOPOROSIS, STATUS POST LEFT LEG SPIRAL FRACTURE: Osteoporosis is a condition where bones become weak and are more likely to break. You are recovering from a leg fracture and awaiting a bone density scan to consider alternative treatments. Continue taking calcium  and vitamin D  supplements, and consider smaller or gummy forms if you have trouble swallowing tablets.  -ASTHMA, POST-COVID ONSET: Asthma is a condition that affects your airways and makes it hard to breathe. Your asthma, which started after a COVID-19 infection, is well-controlled with your current medications. Continue with your current asthma treatment plan.  -GASTROESOPHAGEAL REFLUX DISEASE (GERD): GERD is a condition where stomach acid frequently flows back into the tube connecting your mouth and stomach. Continue taking pantoprazole  for your reflux symptoms. We will check your vitamin D  and calcium  levels and may adjust your iron supplementation if it is causing stomach upset.  -IRON DEFICIENCY ANEMIA: Iron deficiency anemia is a condition where you don't have enough iron, leading to a lower number of red blood cells. We will check your blood counts and may adjust your iron supplementation to three times a week if your blood counts are normal.  -LOWER EXTREMITY MUSCLE CRAMPS: Muscle cramps can be caused by various factors including circulation or electrolyte imbalances. We will check your electrolyte levels and may order a nerve conduction study to rule out neuropathy as a causel.  -RECTAL BLEEDING, LIKELY INTERNAL HEMORRHOID: Rectal bleeding is often caused by hemorrhoids, which are swollen veins in your lower rectum.  Use hydrocortisone  suppositories twice daily for five days, then as needed. Monitor and report any changes in your bleeding pattern.  INSTRUCTIONS: Please follow up for a bone density scan and blood tests to check your vitamin D , calcium , and blood counts. If your electrolyte levels are normal, we will proceed with a nerve conduction study. Continue with your current medications and report any changes in your symptoms.  If you have any problems before your next visit feel free to message me via MyChart (minor issues or questions) or call the office, otherwise you may reach out to schedule an office visit.  Thank you! Saddie Sacks, PA-C

## 2024-08-08 NOTE — Assessment & Plan Note (Signed)
 Rechecking lipid panel with labs. Continue atorvastatin  20 mg daily. Patient has been on this medication for several years and tolerating well.

## 2024-08-08 NOTE — Progress Notes (Signed)
 Established Patient Office Visit  Subjective   Patient ID: Veronica Barrett, female    DOB: June 21, 1951  Age: 73 y.o. MRN: [id number]  Chief Complaint  Patient presents with   Medical Management of Chronic Issues     HPI History of Present Illness   Veronica Barrett is a 73 year old female with osteoporosis who presents for follow-up on her osteoporosis management and recovery from a leg fracture.  Lower extremity fracture recovery - Sustained a spiral fracture of the leg in February - Completed physical therapy - Continues to walk with a limp due to muscle weakness - Recovery expected to take up to a year, though initially hoped for six months  Osteoporosis management - History of osteoporosis - Previously treated with alendronate  for less than a year, discontinued due to jaw clacking and concerns about side effects after fracture - Currently taking calcium  and vitamin D  supplements - Difficulty swallowing vitamin D  tablets - Takes pantoprazole  for reflux symptoms, which she associates with supplement intake  Easy bruising and muscle cramps - Experiences easy bruising with new bruises noted - Muscle cramps in lower legs, particularly at night - Takes atorvastatin  for approximately ten years - Takes daily baby aspirin   Asthma symptoms and management - Asthma developed after COVID-19 infection two years ago - Uses nebulizer with DuoNeb, montelukast , Zyrtec, and Dulera  inhaler daily - Asthma symptoms are well-controlled with current regimen  Rectal bleeding and bowel symptoms - Occasional bright red blood in stool, attributed to hemorrhoids - Sensation of incomplete evacuation during bowel movements for the last month intermittently     ROS Per HPI.    Objective:     BP 111/67   Pulse 63   Temp 98.2 F (36.8 C) (Oral)   Ht 5' 4 (1.626 m)   Wt 168 lb 1.9 oz (76.3 kg)   SpO2 96%   BMI 28.86 kg/m    Physical Exam Constitutional:      General: She is not in acute  distress.    Appearance: Normal appearance.  Cardiovascular:     Rate and Rhythm: Normal rate and regular rhythm.     Heart sounds: Normal heart sounds. No murmur heard.    No friction rub. No gallop.  Pulmonary:     Effort: Pulmonary effort is normal. No respiratory distress.     Breath sounds: Normal breath sounds.  Musculoskeletal:        General: No swelling.     Right lower leg: Normal. No swelling or tenderness. No edema.     Left lower leg: Normal. No swelling or tenderness. No edema.     Right ankle: Normal.     Right Achilles Tendon: No tenderness. Thompson's test negative.     Left ankle: Normal.     Left Achilles Tendon: No tenderness. Thompson's test negative.  Skin:    General: Skin is warm and dry.  Neurological:     General: No focal deficit present.     Mental Status: She is alert.  Psychiatric:        Mood and Affect: Mood normal.        Behavior: Behavior normal.        Thought Content: Thought content normal.     No results found for any visits on 08/08/24.    The 10-year ASCVD risk score (Arnett DK, et al., 2019) is: 8.6%    Assessment & Plan:   Closed fracture of left femur with routine healing, unspecified fracture morphology,  unspecified portion of femur, subsequent encounter -     DG Bone Density; Future  Osteoporosis without current pathological fracture, unspecified osteoporosis type Assessment & Plan: Continue calcium  and vitamin D  supplementation. Now that she is healed from her leg fracture, will update DEXA scan to determine severity of osteoporosis. - Discuss treatment options including twice-yearly injections (Prolia) if scan shows worsening osteoporosis. - Continue calcium  and vitamin D  supplementation. - Will stay away from oral bisphosphonate due to previous intolerance.  Orders: -     DG Bone Density; Future -     VITAMIN D  25 Hydroxy (Vit-D Deficiency, Fractures); Future -     Calcium ; Future  Leg cramps Assessment &  Plan: Chronic muscle cramps with associated tingling of feet at night time, possibly due to nerve conduction, circulation, or electrolyte imbalance. - Check electrolytes including magnesium , potassium, and calcium . - Order nerve conduction study if labs normal.  Orders: -     VITAMIN D  25 Hydroxy (Vit-D Deficiency, Fractures); Future -     TSH; Future -     Comprehensive metabolic panel with GFR; Future -     Magnesium ; Future -     Calcium ; Future -     B12 and Folate Panel; Future  Anemia, unspecified type -     CBC with Differential/Platelet; Future  BMI 28.0-28.9,adult -     TSH; Future -     Hemoglobin A1c; Future -     Lipid panel; Future  Mixed hyperlipidemia Assessment & Plan: Rechecking lipid panel with labs. Continue atorvastatin  20 mg daily. Patient has been on this medication for several years and tolerating well.   Orders: -     Hemoglobin A1c; Future -     Lipid panel; Future  Screening for diabetes mellitus -     Hemoglobin A1c; Future  Acute and chronic respiratory failure with hypoxia [facility]) Assessment & Plan: Follows with pulmonology. Continue current Dulera , albuterol  inhalers as well as daily antihistamine and Singulair .    Gastroesophageal reflux disease, unspecified whether esophagitis present Assessment & Plan: Managed with pantoprazole  40 mg daily. If worsening, consider adding famotidine instead of maximizing PPI due to pre-existing osteoporosis.   Iron deficiency anemia, unspecified iron deficiency anemia type Assessment & Plan: Iron deficiency anemia post-surgery and s/p transfusion with concerns about iron-related constipation and stomach upset. - Check blood counts. - Consider adjusting iron supplementation to Monday, Wednesday, Friday instead of daily if blood counts are normal.   Rectal bleeding Assessment & Plan: Intermittent bright red rectal bleeding likely from internal hemorrhoids. - Prescribe hydrocortisone  2% suppositories. -  Advise use twice daily for five days, then as needed. - Monitor and report changes in bleeding pattern.       Other orders -     Hydrocortisone  Acetate; Place 1 suppository (25 mg total) rectally 2 (two) times daily as needed for hemorrhoids or anal itching.  Dispense: 12 suppository; Refill: 1    Return in about 6 months (around 02/05/2025) for Osteoporosis, leg cramps, hemorrhoids.    Saddie JULIANNA Sacks, PA-C

## 2024-08-09 ENCOUNTER — Other Ambulatory Visit

## 2024-08-09 DIAGNOSIS — Z131 Encounter for screening for diabetes mellitus: Secondary | ICD-10-CM

## 2024-08-09 DIAGNOSIS — R252 Cramp and spasm: Secondary | ICD-10-CM | POA: Diagnosis not present

## 2024-08-09 DIAGNOSIS — M810 Age-related osteoporosis without current pathological fracture: Secondary | ICD-10-CM

## 2024-08-09 DIAGNOSIS — D649 Anemia, unspecified: Secondary | ICD-10-CM

## 2024-08-09 DIAGNOSIS — E782 Mixed hyperlipidemia: Secondary | ICD-10-CM

## 2024-08-09 DIAGNOSIS — Z6828 Body mass index (BMI) 28.0-28.9, adult: Secondary | ICD-10-CM | POA: Diagnosis not present

## 2024-08-10 ENCOUNTER — Ambulatory Visit: Payer: Self-pay

## 2024-08-10 LAB — CBC WITH DIFFERENTIAL/PLATELET
Basophils Absolute: 0 x10E3/uL (ref 0.0–0.2)
Basos: 1 %
EOS (ABSOLUTE): 0.2 x10E3/uL (ref 0.0–0.4)
Eos: 3 %
Hematocrit: 40.2 % (ref 34.0–46.6)
Hemoglobin: 13.1 g/dL (ref 11.1–15.9)
Immature Grans (Abs): 0 x10E3/uL (ref 0.0–0.1)
Immature Granulocytes: 0 %
Lymphocytes Absolute: 1.6 x10E3/uL (ref 0.7–3.1)
Lymphs: 25 %
MCH: 31.4 pg (ref 26.6–33.0)
MCHC: 32.6 g/dL (ref 31.5–35.7)
MCV: 96 fL (ref 79–97)
Monocytes Absolute: 0.5 x10E3/uL (ref 0.1–0.9)
Monocytes: 9 %
Neutrophils Absolute: 4 x10E3/uL (ref 1.4–7.0)
Neutrophils: 62 %
Platelets: 306 x10E3/uL (ref 150–450)
RBC: 4.17 x10E6/uL (ref 3.77–5.28)
RDW: 12.7 % (ref 11.7–15.4)
WBC: 6.3 x10E3/uL (ref 3.4–10.8)

## 2024-08-10 LAB — COMPREHENSIVE METABOLIC PANEL WITH GFR
ALT: 18 IU/L (ref 0–32)
AST: 22 IU/L (ref 0–40)
Albumin: 4.4 g/dL (ref 3.8–4.8)
Alkaline Phosphatase: 100 IU/L (ref 49–135)
BUN/Creatinine Ratio: 16 (ref 12–28)
BUN: 10 mg/dL (ref 8–27)
Bilirubin Total: 0.4 mg/dL (ref 0.0–1.2)
CO2: 20 mmol/L (ref 20–29)
Calcium: 9.1 mg/dL (ref 8.7–10.3)
Chloride: 103 mmol/L (ref 96–106)
Creatinine, Ser: 0.62 mg/dL (ref 0.57–1.00)
Globulin, Total: 2.2 g/dL (ref 1.5–4.5)
Glucose: 85 mg/dL (ref 70–99)
Potassium: 4.7 mmol/L (ref 3.5–5.2)
Sodium: 139 mmol/L (ref 134–144)
Total Protein: 6.6 g/dL (ref 6.0–8.5)
eGFR: 95 mL/min/1.73 (ref >59)

## 2024-08-10 LAB — LIPID PANEL
Chol/HDL Ratio: 2.3 ratio (ref 0.0–4.4)
Cholesterol, Total: 154 mg/dL (ref 100–199)
HDL: 67 mg/dL (ref >39)
LDL Chol Calc (NIH): 75 mg/dL (ref 0–99)
Triglycerides: 56 mg/dL (ref 0–149)
VLDL Cholesterol Cal: 12 mg/dL (ref 5–40)

## 2024-08-10 LAB — HEMOGLOBIN A1C
Est. average glucose Bld gHb Est-mCnc: 111 mg/dL
Hgb A1c MFr Bld: 5.5 % (ref 4.8–5.6)

## 2024-08-10 LAB — B12 AND FOLATE PANEL
Folate: 16.8 ng/mL (ref >3.0)
Vitamin B-12: 282 pg/mL (ref 232–1245)

## 2024-08-10 LAB — MAGNESIUM: Magnesium: 2.3 mg/dL (ref 1.6–2.3)

## 2024-08-10 LAB — TSH: TSH: 2.49 u[IU]/mL (ref 0.450–4.500)

## 2024-08-10 LAB — VITAMIN D 25 HYDROXY (VIT D DEFICIENCY, FRACTURES): Vit D, 25-Hydroxy: 51.5 ng/mL (ref 30.0–100.0)

## 2024-09-21 DIAGNOSIS — J4540 Moderate persistent asthma, uncomplicated: Secondary | ICD-10-CM | POA: Diagnosis not present

## 2024-09-21 DIAGNOSIS — J300 Vasomotor rhinitis: Secondary | ICD-10-CM | POA: Diagnosis not present

## 2024-09-21 DIAGNOSIS — J398 Other specified diseases of upper respiratory tract: Secondary | ICD-10-CM | POA: Diagnosis not present

## 2024-09-29 ENCOUNTER — Ambulatory Visit (INDEPENDENT_AMBULATORY_CARE_PROVIDER_SITE_OTHER)

## 2024-09-29 ENCOUNTER — Ambulatory Visit
Admission: RE | Admit: 2024-09-29 | Discharge: 2024-09-29 | Disposition: A | Discharge status: Home / Self Care | Source: Ambulatory Visit | Attending: Family Medicine | Admitting: Family Medicine

## 2024-09-29 ENCOUNTER — Ambulatory Visit: Payer: Self-pay

## 2024-09-29 VITALS — BP 116/74 | HR 58 | Temp 98.7°F | Resp 20 | Ht 65.0 in | Wt 168.2 lb

## 2024-09-29 DIAGNOSIS — M25532 Pain in left wrist: Secondary | ICD-10-CM | POA: Diagnosis not present

## 2024-09-29 DIAGNOSIS — M79674 Pain in right toe(s): Secondary | ICD-10-CM

## 2024-09-29 DIAGNOSIS — J4541 Moderate persistent asthma with (acute) exacerbation: Secondary | ICD-10-CM | POA: Insufficient documentation

## 2024-09-29 DIAGNOSIS — J4540 Moderate persistent asthma, uncomplicated: Secondary | ICD-10-CM | POA: Insufficient documentation

## 2024-09-29 DIAGNOSIS — J309 Allergic rhinitis, unspecified: Secondary | ICD-10-CM | POA: Insufficient documentation

## 2024-09-29 DIAGNOSIS — J300 Vasomotor rhinitis: Secondary | ICD-10-CM | POA: Insufficient documentation

## 2024-09-29 NOTE — Discharge Instructions (Signed)
 The radiologist do not see any broken bones.  There is possibly?  Evidence of an old healed fracture in your third toe.  They do not see any thing that looks like a new fracture.  Please call the podiatry office about your toe swelling.  They may need to do more advanced imaging or other tests to determine what is causing this problem.  Please call the orthopedic/hand office about your wrist pain  Please follow-up with primary care about both these issues

## 2024-09-29 NOTE — ED Provider Notes (Signed)
 [facility]    CSN: [id number] Arrival date & time: 09/29/24  1551      History   Chief Complaint Chief Complaint  Patient presents with   Foot Pain    Entered by patient    HPI Veronica Barrett is a 73 y.o. female.    Foot Pain  Here for pain in her right third toe.  It has been swollen and purple-colored and painful for about 1 month.  No trauma  No fever  She also has pain in her left wrist overlying the ulnar side of the wrist, mainly on the volar surface where her watch would put pressure on the bone there.  No trauma there either.  Last eGFR was 95 in September this year She is allergic to iodine and adhesive  Past Medical History:  Diagnosis Date   Allergy    seasonal allergy   Arthritis    left hip   Asthma    Depression    HSV infection    vag    Patient Active Problem List   Diagnosis Date Noted   Vasomotor rhinitis 09/29/2024   Moderate persistent asthma with exacerbation 09/29/2024   Moderate persistent asthma without complication 09/29/2024   Allergic rhinitis, unspecified 09/29/2024   Acute and chronic respiratory failure with hypoxia (HCC) 08/08/2024   GERD (gastroesophageal reflux disease) 08/08/2024   Iron deficiency anemia 08/08/2024   Leg cramps 08/08/2024   Rectal bleeding 08/08/2024   Postoperative anemia due to acute blood loss 01/12/2024   At risk for deep venous thrombosis 01/12/2024   Age-related osteoporosis with current pathological fracture, left femur, subsequent encounter for fracture with routine healing 01/11/2024   Closed displaced oblique fracture of shaft of left femur with routine healing 01/10/2024   Osteoporosis without current pathological fracture 10/12/2023   Moderate persistent asthma 08/10/2023   Bronchitis 06/18/2021   Leukocytosis 06/17/2021   Environmental and seasonal allergies 10/07/2018   Chronic sinusitis 10/07/2018   H/O sinus surgery 10/07/2018   Hyperlipidemia 10/07/2018   S/P left THA,  AA 10/17/2014   Recurrent HSV (herpes simplex virus) 04/21/2013   Depression, recurrent 02/20/2010   Allergic rhinitis 04/16/2007    Past Surgical History:  Procedure Laterality Date   APPENDECTOMY  11/17/1982   EYE SURGERY Left    left vitreous   LEG SURGERY Left 01/10/2024   TOTAL HIP ARTHROPLASTY Left 10/17/2014   Procedure: LEFT TOTAL HIP ARTHROPLASTY ANTERIOR APPROACH;  Surgeon: Donnice JONETTA Car, MD;  Location: [facility];  Service: Orthopedics;  Laterality: Left;    OB History     Gravida      Para      Term      Preterm      AB      Living  2      SAB      IAB      Ectopic      Multiple      Live Births               Home Medications    Prior to Admission medications   Medication Sig Start Date End Date Taking? Authorizing Provider  acetaminophen  (TYLENOL ) 325 MG tablet Take 650 mg by mouth every 6 (six) hours as needed. 01/25/24  Yes [provider]  ascorbic acid (VITAMIN C) 500 MG tablet Take 500 mg by mouth daily. 01/25/24 01/24/25 Yes [provider]  calcium  citrate (CALCITRATE - DOSED IN MG ELEMENTAL CALCIUM ) 950 (200 Ca) MG tablet  Take 950 mg by mouth 2 (two) times daily. 01/25/24  Yes [provider]  Difluprednate 0.05 % EMUL Place 1 drop into the left eye 4 (four) times daily. 04/26/24  Yes [provider]  famotidine (PEPCID) 20 MG tablet Take 20 mg by mouth daily. 01/25/24 01/24/25 Yes [provider]  ferrous gluconate (FERGON) 324 MG tablet Take 324 mg by mouth daily with breakfast. 01/25/24 01/24/25 Yes [provider]  gatifloxacin (ZYMAXID) 0.5 % SOLN Place 1 drop into the left eye 4 (four) times daily. 04/26/24  Yes [provider]  ketorolac  (ACULAR ) 0.5 % ophthalmic solution Place 1 drop into the left eye 4 (four) times daily. 04/26/24  Yes [provider]  predniSONE  (DELTASONE ) 10 MG tablet Take 10 mg by mouth as directed. 09/21/23  Yes [provider]  senna  (SENOKOT) 8.6 MG tablet Take 17.2 mg by mouth daily. 01/25/24 01/24/25 Yes [provider]  acyclovir  (ZOVIRAX ) 200 MG capsule TAKE 1 CAPSULE BY MOUTH 5 TIMES DAILY AS NEEDED FOR BREAKOUTS 04/05/24   Chandra Toribio POUR, MD  albuterol  (VENTOLIN  HFA) 108 [phone] Base) MCG/ACT inhaler Inhale 2 puffs into the lungs every 6 (six) hours as needed for wheezing or shortness of breath. 07/13/23   Hunsucker, Donnice SAUNDERS, MD  alendronate  (FOSAMAX ) 70 MG tablet Take 70 mg by mouth once a week.    [provider]  ASPIRIN  LOW DOSE 81 MG tablet SMARTSIG:1 Tablet(s) By Mouth Morning-Night 01/25/24   [provider]  atorvastatin  (LIPITOR) 20 MG tablet Take 1 tablet by mouth once daily 07/26/24   Gayle Numbers F, PA-C  budesonide -formoterol  (SYMBICORT) 160-4.5 MCG/ACT inhaler Inhale 2 puffs into the lungs as directed.    [provider]  cetirizine (ZYRTEC) 10 MG tablet Take 10 mg by mouth daily.    [provider]  citalopram  (CELEXA ) 20 MG tablet TAKE 1 & 1/2 (ONE & ONE-HALF) TABLETS BY MOUTH ONCE DAILY 07/25/24   Gayle Numbers F, PA-C  fluticasone  (FLONASE ) 50 MCG/ACT nasal spray Place 1 spray into both nostrils daily.    [provider]  fluticasone -salmeterol (ADVAIR) 250-50 MCG/ACT AEPB Inhale 1 puff into the lungs in the morning and at bedtime.    [provider]  hydrocortisone  (ANUSOL -HC) 25 MG suppository Place 1 suppository (25 mg total) rectally 2 (two) times daily as needed for hemorrhoids or anal itching. 08/08/24   Gayle Numbers FALCON, PA-C  ipratropium-albuterol  (DUONEB) 0.5-2.5 (3) MG/3ML SOLN Take 3 mLs by nebulization every 6 (six) hours as needed (shortness of breath, cough or wheeze). 12/07/23   Hunsucker, Donnice SAUNDERS, MD  mometasone -formoterol  (DULERA ) 200-5 MCG/ACT AERO Inhale 2 puffs into the lungs daily. 05/30/24   Hunsucker, Donnice SAUNDERS, MD  montelukast  (SINGULAIR ) 10 MG tablet TAKE 1 TABLET BY MOUTH AT BEDTIME 07/26/24   Gayle Numbers FALCON, PA-C  Nebulizer System  All-In-One MISC Use with nebulized albuterol  once every 4-6 hours for wheezing and shortness of breath. 06/09/21   Christopher Savannah, PA-C  pantoprazole  (PROTONIX ) 40 MG tablet Take 1 tablet (40 mg total) by mouth daily. 11/05/21   Hunsucker, Donnice SAUNDERS, MD  Respiratory Therapy Supplies (NEBULIZER MASK ADULT) MISC 1 Inhaler by Does not apply route every 4 (four) hours as needed. 06/09/21   Christopher Savannah, PA-C    Family History Family History  Problem Relation Age of Onset   Cancer Father    Cancer Sister    Thyroid  cancer Sister    Arthritis Other    Colon cancer  Neg Hx     Social History Social History   Tobacco Use   Smoking status: Never    Passive exposure: Never   Smokeless tobacco: Never  Vaping Use   Vaping status: Never Used  Substance Use Topics   Alcohol use: Yes    Alcohol/week: 10.0 standard drinks of alcohol    Types: 10 Glasses of wine per week   Drug use: No     Allergies   Povidone iodine, Seasonal ic [cholestatin], Tape, and Wound dressing adhesive   Review of Systems Review of Systems   Physical Exam Triage Vital Signs ED Triage Vitals  Encounter Vitals Group     BP 09/29/24 1621 116/74     Girls Systolic BP Percentile --      Girls Diastolic BP Percentile --      Boys Systolic BP Percentile --      Boys Diastolic BP Percentile --      Pulse Rate 09/29/24 1621 (!) 58     Resp 09/29/24 1621 20     Temp 09/29/24 1621 98.7 F (37.1 C)     Temp Source 09/29/24 1621 Oral     SpO2 09/29/24 1621 98 %     Weight 09/29/24 1619 168 lb 3.4 oz (76.3 kg)     Height 09/29/24 1619 5' 5 (1.651 m)     Head Circumference --      Peak Flow --      Pain Score 09/29/24 1615 6     Pain Loc --      Pain Education --      Exclude from Growth Chart --    No data found.  Updated Vital Signs BP 116/74 (BP Location: Left Arm)   Pulse (!) 58   Temp 98.7 F (37.1 C) (Oral)   Resp 20   Ht 5' 5 (1.651 m)   Wt 76.3 kg   SpO2 98%   BMI 27.99 kg/m   Visual  Acuity Right Eye Distance:   Left Eye Distance:   Bilateral Distance:    Right Eye Near:   Left Eye Near:    Bilateral Near:     Physical Exam Vitals reviewed.  Constitutional:      General: She is not in acute distress.    Appearance: She is not ill-appearing, toxic-appearing or diaphoretic.  Musculoskeletal:     Comments: There is a little prominence of the ulnar side of the volar left wrist that seems to be bony.  No erythema or deformity  Her right third toe is discolored purple and is swollen, mainly on the proximal portion.  Sensation to the distal toe is normal.  Skin:    Coloration: Skin is not pale.  Neurological:     Mental Status: She is alert and oriented to person, place, and time.  Psychiatric:        Behavior: Behavior normal.      UC Treatments / Results  Labs (all labs ordered are listed, but only abnormal results are displayed) Labs Reviewed - No data to display  EKG   Radiology DG Wrist Complete Left Result Date: 09/29/2024 CLINICAL DATA:  Left wrist pain. EXAM: DG WRIST COMPLETE 3+V*L* COMPARISON:  None Available. FINDINGS: No acute fracture or dislocation. The bones are osteopenic. No significant arthritic change. The soft tissues unremarkable. IMPRESSION: 1. No acute fracture or dislocation. 2. Osteopenia. Electronically Signed   By: Vanetta Chou M.D.   On: 09/29/2024 17:14   DG Toe 3rd Right Result Date:  09/29/2024 CLINICAL DATA:  Right third toe pain and swelling. EXAM: RIGHT THIRD TOE COMPARISON:  None Available. FINDINGS: No acute fracture. Faint lucency through the distal aspect of the proximal phalanx of the third toe may represent an old healed fracture. No dislocation. The bones are osteopenic. There is arthritic changes of the interphalangeal joints of the third digit. There is soft tissue swelling of the third digit. No acute or a nodule/gas. IMPRESSION: 1. No acute fracture or dislocation. 2. Arthritic changes and soft tissue swelling of  the third digit. Electronically Signed   By: Vanetta Chou M.D.   On: 09/29/2024 17:13    Procedures Procedures (including critical care time)  Medications Ordered in UC Medications - No data to display  Initial Impression / Assessment and Plan / UC Course  I have reviewed the triage vital signs and the nursing notes.  Pertinent labs & imaging results that were available during my care of the patient were reviewed by me and considered in my medical decision making (see chart for details).     See radiology readings above.  On the toe x-ray, there is note of possible lucency like an old healed fracture.  The appearance of her toe clinically is not consistent with that.  The bruising and swelling looks like there is some other soft tissue abnormality going on.  Wrist x-ray is negative.  She is given contact information for podiatry.  Also for hand/Ortho.  I have asked her to follow-up with primary care. Final Clinical Impressions(s) / UC Diagnoses   Final diagnoses:  Pain of toe of right foot  Left wrist pain     Discharge Instructions      The radiologist do not see any broken bones.  There is possibly?  Evidence of an old healed fracture in your third toe.  They do not see any thing that looks like a new fracture.  Please call the podiatry office about your toe swelling.  They may need to do more advanced imaging or other tests to determine what is causing this problem.  Please call the orthopedic/hand office about your wrist pain  Please follow-up with primary care about both these issues     ED Prescriptions   None    PDMP not reviewed this encounter.   Vonna Sharlet POUR, MD 09/29/24 [phone]

## 2024-09-29 NOTE — ED Triage Notes (Signed)
 Patient reports swelling of the right foot (3rd toe) with some redness. No injury known. This has been going on for a while but just not improving.   Of Note; Also have an area on my left wrist that is sensitive to touch.

## 2024-09-29 NOTE — Telephone Encounter (Signed)
 FYI Only or Action Required?: FYI only for provider: Scheduled at UC, no available in office appts until Dec. Did not want to travel to regional location.  Patient was last seen in primary care on 08/08/2024 by Gayle [name], PA-C.  Called Nurse Triage reporting Foot Swelling and Wrist Pain.  Symptoms began several weeks ago.  Interventions attempted: OTC medications: tylenol .  Symptoms are: gradually worsening.  Triage Disposition: See Physician Within 24 Hours  Patient/caregiver understands and will follow disposition?: Yes Reason for Disposition  Nursing judgment or information in reference  Answer Assessment - Initial Assessment Questions Denies hx of gout.   1. REASON FOR CALL: What is your main concern right now?     Reports middle right toe is edematous and painful to the touch x 3 weeks. Also reports left wrist tenderness x 1 month. Toe appears bruised, is not hot.  2. ONSET:      See above  3. SEVERITY:      6/10 with palpation  4. FUNCTIONAL IMPAIRMENT: How have things been going for you overall? Have you had more difficulty than usual doing your normal daily activities? (e.g., self-care, school, work, interactions)     Able to bend toe and wrist  5. RELIEVING AND AGGRAVATING FACTORS: What makes it better or worse? (e.g., certain activities, rest)     Rest  Protocols used: No Guideline Available-[facility]

## 2024-10-03 DIAGNOSIS — Z1231 Encounter for screening mammogram for malignant neoplasm of breast: Secondary | ICD-10-CM | POA: Diagnosis not present

## 2024-10-03 DIAGNOSIS — Z01419 Encounter for gynecological examination (general) (routine) without abnormal findings: Secondary | ICD-10-CM | POA: Diagnosis not present

## 2024-10-03 LAB — HM MAMMOGRAPHY

## 2024-10-25 ENCOUNTER — Other Ambulatory Visit: Payer: Self-pay

## 2024-10-25 DIAGNOSIS — F339 Major depressive disorder, recurrent, unspecified: Secondary | ICD-10-CM

## 2024-11-16 ENCOUNTER — Ambulatory Visit (INDEPENDENT_AMBULATORY_CARE_PROVIDER_SITE_OTHER): Admitting: Radiology

## 2024-11-16 ENCOUNTER — Ambulatory Visit: Payer: Self-pay

## 2024-11-16 ENCOUNTER — Other Ambulatory Visit: Payer: Self-pay

## 2024-11-16 ENCOUNTER — Emergency Department (HOSPITAL_BASED_OUTPATIENT_CLINIC_OR_DEPARTMENT_OTHER)

## 2024-11-16 ENCOUNTER — Ambulatory Visit
Admission: EM | Admit: 2024-11-16 | Discharge: 2024-11-16 | Disposition: A | Discharge status: Home / Self Care | Attending: Physician Assistant | Admitting: Physician Assistant

## 2024-11-16 ENCOUNTER — Inpatient Hospital Stay (HOSPITAL_BASED_OUTPATIENT_CLINIC_OR_DEPARTMENT_OTHER)
Admission: EM | Admit: 2024-11-16 | Discharge: 2024-11-22 | DRG: 871 | Disposition: A | Discharge status: Home / Self Care | Attending: Internal Medicine | Admitting: Internal Medicine

## 2024-11-16 ENCOUNTER — Emergency Department (HOSPITAL_BASED_OUTPATIENT_CLINIC_OR_DEPARTMENT_OTHER): Admitting: Radiology

## 2024-11-16 DIAGNOSIS — R062 Wheezing: Secondary | ICD-10-CM

## 2024-11-16 DIAGNOSIS — Z96642 Presence of left artificial hip joint: Secondary | ICD-10-CM | POA: Diagnosis present

## 2024-11-16 DIAGNOSIS — Z809 Family history of malignant neoplasm, unspecified: Secondary | ICD-10-CM

## 2024-11-16 DIAGNOSIS — Z8261 Family history of arthritis: Secondary | ICD-10-CM

## 2024-11-16 DIAGNOSIS — Z888 Allergy status to other drugs, medicaments and biological substances status: Secondary | ICD-10-CM

## 2024-11-16 DIAGNOSIS — R042 Hemoptysis: Secondary | ICD-10-CM | POA: Diagnosis present

## 2024-11-16 DIAGNOSIS — R059 Cough, unspecified: Secondary | ICD-10-CM | POA: Diagnosis present

## 2024-11-16 DIAGNOSIS — Z7951 Long term (current) use of inhaled steroids: Secondary | ICD-10-CM

## 2024-11-16 DIAGNOSIS — R7981 Abnormal blood-gas level: Secondary | ICD-10-CM

## 2024-11-16 DIAGNOSIS — Z7983 Long term (current) use of bisphosphonates: Secondary | ICD-10-CM

## 2024-11-16 DIAGNOSIS — B9781 Human metapneumovirus as the cause of diseases classified elsewhere: Secondary | ICD-10-CM | POA: Diagnosis present

## 2024-11-16 DIAGNOSIS — R918 Other nonspecific abnormal finding of lung field: Secondary | ICD-10-CM

## 2024-11-16 DIAGNOSIS — Z79899 Other long term (current) drug therapy: Secondary | ICD-10-CM

## 2024-11-16 DIAGNOSIS — J189 Pneumonia, unspecified organism: Principal | ICD-10-CM | POA: Diagnosis present

## 2024-11-16 DIAGNOSIS — R0602 Shortness of breath: Secondary | ICD-10-CM | POA: Diagnosis not present

## 2024-11-16 DIAGNOSIS — E785 Hyperlipidemia, unspecified: Secondary | ICD-10-CM | POA: Diagnosis present

## 2024-11-16 DIAGNOSIS — D509 Iron deficiency anemia, unspecified: Secondary | ICD-10-CM | POA: Diagnosis present

## 2024-11-16 DIAGNOSIS — J4540 Moderate persistent asthma, uncomplicated: Secondary | ICD-10-CM | POA: Diagnosis present

## 2024-11-16 DIAGNOSIS — E876 Hypokalemia: Secondary | ICD-10-CM | POA: Diagnosis present

## 2024-11-16 DIAGNOSIS — E872 Acidosis, unspecified: Secondary | ICD-10-CM | POA: Diagnosis present

## 2024-11-16 DIAGNOSIS — R001 Bradycardia, unspecified: Secondary | ICD-10-CM | POA: Insufficient documentation

## 2024-11-16 DIAGNOSIS — J9601 Acute respiratory failure with hypoxia: Secondary | ICD-10-CM | POA: Diagnosis present

## 2024-11-16 DIAGNOSIS — J188 Other pneumonia, unspecified organism: Secondary | ICD-10-CM | POA: Insufficient documentation

## 2024-11-16 DIAGNOSIS — F339 Major depressive disorder, recurrent, unspecified: Secondary | ICD-10-CM | POA: Diagnosis present

## 2024-11-16 DIAGNOSIS — K219 Gastro-esophageal reflux disease without esophagitis: Secondary | ICD-10-CM | POA: Diagnosis present

## 2024-11-16 DIAGNOSIS — D649 Anemia, unspecified: Secondary | ICD-10-CM

## 2024-11-16 DIAGNOSIS — D72829 Elevated white blood cell count, unspecified: Secondary | ICD-10-CM | POA: Diagnosis present

## 2024-11-16 DIAGNOSIS — A419 Sepsis, unspecified organism: Principal | ICD-10-CM | POA: Diagnosis present

## 2024-11-16 DIAGNOSIS — K649 Unspecified hemorrhoids: Secondary | ICD-10-CM | POA: Diagnosis present

## 2024-11-16 DIAGNOSIS — J4541 Moderate persistent asthma with (acute) exacerbation: Secondary | ICD-10-CM | POA: Diagnosis present

## 2024-11-16 DIAGNOSIS — R6520 Severe sepsis without septic shock: Secondary | ICD-10-CM | POA: Diagnosis present

## 2024-11-16 DIAGNOSIS — Z91048 Other nonmedicinal substance allergy status: Secondary | ICD-10-CM

## 2024-11-16 DIAGNOSIS — Z1152 Encounter for screening for COVID-19: Secondary | ICD-10-CM

## 2024-11-16 LAB — BASIC METABOLIC PANEL WITH GFR
Anion gap: 14 (ref 5–15)
BUN: 11 mg/dL (ref 8–23)
CO2: 22 mmol/L (ref 22–32)
Calcium: 9.4 mg/dL (ref 8.9–10.3)
Chloride: 101 mmol/L (ref 98–111)
Creatinine, Ser: 0.62 mg/dL (ref 0.44–1.00)
GFR, Estimated: >60 mL/min
Glucose, Bld: 129 mg/dL — ABNORMAL HIGH (ref 70–99)
Potassium: 3.3 mmol/L — ABNORMAL LOW (ref 3.5–5.1)
Sodium: 138 mmol/L (ref 135–145)

## 2024-11-16 LAB — HEPATIC FUNCTION PANEL
ALT: 15 U/L (ref 0–44)
AST: 18 U/L (ref 15–41)
Albumin: 3.9 g/dL (ref 3.5–5.0)
Alkaline Phosphatase: 97 U/L (ref 38–126)
Bilirubin, Direct: 0.3 mg/dL — ABNORMAL HIGH (ref 0.0–0.2)
Indirect Bilirubin: 0.3 mg/dL (ref 0.3–0.9)
Total Bilirubin: 0.6 mg/dL (ref 0.0–1.2)
Total Protein: 6.8 g/dL (ref 6.5–8.1)

## 2024-11-16 LAB — PROTIME-INR
INR: 1 (ref 0.8–1.2)
Prothrombin Time: 13.8 s (ref 11.4–15.2)

## 2024-11-16 LAB — CBC
HCT: 35.6 % — ABNORMAL LOW (ref 36.0–46.0)
Hemoglobin: 11.9 g/dL — ABNORMAL LOW (ref 12.0–15.0)
MCH: 30.8 pg (ref 26.0–34.0)
MCHC: 33.4 g/dL (ref 30.0–36.0)
MCV: 92.2 fL (ref 80.0–100.0)
Platelets: 307 K/uL (ref 150–400)
RBC: 3.86 MIL/uL — ABNORMAL LOW (ref 3.87–5.11)
RDW: 13.7 % (ref 11.5–15.5)
WBC: 12.5 K/uL — ABNORMAL HIGH (ref 4.0–10.5)
nRBC: 0 % (ref 0.0–0.2)

## 2024-11-16 LAB — URINALYSIS, W/ REFLEX TO CULTURE (INFECTION SUSPECTED)
Bacteria, UA: NONE SEEN
Bilirubin Urine: NEGATIVE
Glucose, UA: NEGATIVE mg/dL
Ketones, ur: NEGATIVE mg/dL
Nitrite: NEGATIVE
Specific Gravity, Urine: 1.01 (ref 1.005–1.030)
pH: 6 (ref 5.0–8.0)

## 2024-11-16 LAB — LACTIC ACID, PLASMA
Lactic Acid, Venous: 2.2 mmol/L (ref 0.5–1.9)
Lactic Acid, Venous: 1.4 mmol/L (ref 0.5–1.9)

## 2024-11-16 LAB — RESP PANEL BY RT-PCR (RSV, FLU A&B, COVID)  RVPGX2
Influenza A by PCR: NEGATIVE
Influenza B by PCR: NEGATIVE
Resp Syncytial Virus by PCR: NEGATIVE
SARS Coronavirus 2 by RT PCR: NEGATIVE

## 2024-11-16 LAB — POC COVID19/FLU A&B COMBO
Covid Antigen, POC: NEGATIVE
Influenza A Antigen, POC: NEGATIVE
Influenza B Antigen, POC: NEGATIVE

## 2024-11-16 MED ORDER — LACTATED RINGERS IV BOLUS: 1000.0000 mL | Freq: Once | INTRAVENOUS | Status: DC

## 2024-11-16 MED ORDER — ALBUTEROL SULFATE (2.5 MG/3ML) 0.083% IN NEBU
2.5000 mg | INHALATION_SOLUTION | RESPIRATORY_TRACT | Status: DC | PRN
  Filled 2024-11-16: qty 3

## 2024-11-16 MED ORDER — ASPIRIN 81 MG PO TBEC: 81.0000 mg | DELAYED_RELEASE_TABLET | Freq: Every day | ORAL | Status: DC

## 2024-11-16 MED ORDER — POTASSIUM CHLORIDE 20 MEQ PO PACK
40.0000 meq | PACK | Freq: Once | ORAL | Status: AC
  Administered 2024-11-16: 40 meq via ORAL
  Filled 2024-11-16: qty 2

## 2024-11-16 MED ORDER — METHYLPREDNISOLONE SODIUM SUCC 125 MG IJ SOLR
INTRAMUSCULAR | Status: AC
  Filled 2024-11-16: qty 4

## 2024-11-16 MED ORDER — MONTELUKAST SODIUM 10 MG PO TABS
10.0000 mg | ORAL_TABLET | Freq: Every day | ORAL | Status: DC
  Filled 2024-11-16 (×6): qty 1

## 2024-11-16 MED ORDER — LACTATED RINGERS IV SOLN: INTRAVENOUS | Status: DC

## 2024-11-16 MED ORDER — METHYLPREDNISOLONE SODIUM SUCC 40 MG IJ SOLR
40.0000 mg | Freq: Every day | INTRAMUSCULAR | Status: DC
  Administered 2024-11-17 – 2024-11-19 (×3): 40 mg via INTRAVENOUS
  Filled 2024-11-16 (×5): qty 1

## 2024-11-16 MED ORDER — ACETAMINOPHEN 325 MG PO TABS: 650.0000 mg | ORAL_TABLET | Freq: Four times a day (QID) | ORAL | Status: DC | PRN

## 2024-11-16 MED ORDER — SODIUM CHLORIDE 0.9 % IV SOLN
2.0000 g | Freq: Once | INTRAVENOUS | Status: AC
  Administered 2024-11-16: 2 g via INTRAVENOUS
  Filled 2024-11-16: qty 20

## 2024-11-16 MED ORDER — LACTATED RINGERS IV BOLUS
1000.0000 mL | Freq: Once | INTRAVENOUS | Status: AC
  Administered 2024-11-16: 1000 mL via INTRAVENOUS

## 2024-11-16 MED ORDER — MAGNESIUM SULFATE 2 GM/50ML IV SOLN
2.0000 g | INTRAVENOUS | Status: AC
  Administered 2024-11-16: 2 g via INTRAVENOUS
  Filled 2024-11-16: qty 50

## 2024-11-16 MED ORDER — LACTATED RINGERS IV BOLUS (SEPSIS)
500.0000 mL | Freq: Once | INTRAVENOUS | Status: AC
  Administered 2024-11-16: 500 mL via INTRAVENOUS

## 2024-11-16 MED ORDER — LORATADINE 10 MG PO TABS
10.0000 mg | ORAL_TABLET | Freq: Every day | ORAL | Status: DC
  Administered 2024-11-16 – 2024-11-22 (×6): 10 mg via ORAL
  Filled 2024-11-16 (×8): qty 1

## 2024-11-16 MED ORDER — BENZONATATE 100 MG PO CAPS: 100.0000 mg | ORAL_CAPSULE | Freq: Three times a day (TID) | ORAL | Status: DC

## 2024-11-16 MED ORDER — ONDANSETRON HCL 4 MG/2ML IJ SOLN
4.0000 mg | Freq: Once | INTRAMUSCULAR | Status: AC
  Administered 2024-11-16: 4 mg via INTRAVENOUS
  Filled 2024-11-16: qty 2

## 2024-11-16 MED ORDER — SODIUM CHLORIDE 0.9 % IV SOLN
500.0000 mg | Freq: Once | INTRAVENOUS | Status: AC
  Administered 2024-11-16: 500 mg via INTRAVENOUS
  Filled 2024-11-16: qty 5

## 2024-11-16 MED ORDER — FLUTICASONE FUROATE-VILANTEROL 200-25 MCG/ACT IN AEPB
1.0000 | INHALATION_SPRAY | Freq: Every day | RESPIRATORY_TRACT | Status: DC
  Administered 2024-11-18: 1 via RESPIRATORY_TRACT
  Filled 2024-11-16 (×2): qty 28

## 2024-11-16 MED ORDER — ALBUTEROL SULFATE (2.5 MG/3ML) 0.083% IN NEBU
5.0000 mg | INHALATION_SOLUTION | Freq: Once | RESPIRATORY_TRACT | Status: AC
  Administered 2024-11-16: 5 mg via RESPIRATORY_TRACT
  Filled 2024-11-16: qty 6

## 2024-11-16 MED ORDER — IOHEXOL 350 MG/ML SOLN
75.0000 mL | Freq: Once | INTRAVENOUS | Status: AC | PRN
  Administered 2024-11-16: 75 mL via INTRAVENOUS

## 2024-11-16 MED ORDER — IPRATROPIUM-ALBUTEROL 0.5-2.5 (3) MG/3ML IN SOLN
3.0000 mL | Freq: Once | RESPIRATORY_TRACT | Status: AC
  Administered 2024-11-16: 3 mL via RESPIRATORY_TRACT

## 2024-11-16 MED ORDER — CITALOPRAM HYDROBROMIDE 20 MG PO TABS
30.0000 mg | ORAL_TABLET | Freq: Every day | ORAL | Status: DC
  Administered 2024-11-16 – 2024-11-20 (×5): 30 mg via ORAL
  Filled 2024-11-16: qty 1
  Filled 2024-11-16: qty 2
  Filled 2024-11-16: qty 1.5
  Filled 2024-11-16: qty 1
  Filled 2024-11-16: qty 2
  Filled 2024-11-16: qty 1
  Filled 2024-11-16: qty 2

## 2024-11-16 MED ORDER — ACETAMINOPHEN 325 MG PO TABS
650.0000 mg | ORAL_TABLET | Freq: Once | ORAL | Status: AC
  Administered 2024-11-16: 650 mg via ORAL
  Filled 2024-11-16: qty 2

## 2024-11-16 MED ORDER — SODIUM CHLORIDE 0.9 % IV SOLN
250.0000 mg | Freq: Once | INTRAVENOUS | Status: AC
  Administered 2024-11-16: 250 mg via INTRAVENOUS
  Filled 2024-11-16: qty 250

## 2024-11-16 MED ORDER — PANTOPRAZOLE SODIUM 40 MG PO TBEC
40.0000 mg | DELAYED_RELEASE_TABLET | Freq: Every day | ORAL | Status: DC
  Administered 2024-11-16 – 2024-11-22 (×6): 40 mg via ORAL
  Filled 2024-11-16 (×8): qty 1

## 2024-11-16 MED ORDER — IPRATROPIUM-ALBUTEROL 0.5-2.5 (3) MG/3ML IN SOLN
3.0000 mL | Freq: Four times a day (QID) | RESPIRATORY_TRACT | Status: DC
  Administered 2024-11-16 – 2024-11-17 (×2): 3 mL via RESPIRATORY_TRACT
  Filled 2024-11-16 (×2): qty 3

## 2024-11-16 NOTE — Discharge Instructions (Signed)
 You were seen today for concerns of shortness of breath, coughing and wheezing.  Your oxygen saturation has stayed consistently below 95% and with not on oxygen it has been between 89 to 91%.  This did not improve despite giving a breathing treatment and my review of your x-ray is concerning for potential pneumonia in your left lower lung field.  There are still waiting on radiology to review your x-ray for final interpretation.  Since your symptoms are not improving and we have limited resources here in urgent care I am recommending that you go to the emergency room for evaluation, stabilization and management.  You have declined EMS transportation and states that you will go via private vehicle.  If at any point on the way to the emergency room he started to feel like you are having significant difficulty breathing, chest pain, loss of consciousness please call 911 for assistance.

## 2024-11-16 NOTE — ED Notes (Signed)
 Respirations have increased. Pt appears to be visibly SOB. Support person at bedside states this has been happening on and off. RR 22 at this time. Pt states she would like to stay off the oxygen if possible to see how her body will respond. PA made aware.

## 2024-11-16 NOTE — ED Notes (Signed)
 Patient is being discharged from the Urgent Care and sent to the Emergency Department via private vehicle with family member . Per Rocky Mecum PA, patient is in need of higher level of care due to increased RR and low O2 readings. Patient is aware and verbalizes understanding of plan of care.   Vitals:   11/16/24 1133 11/16/24 1245  BP:  99/66  Pulse:  62  Resp:  (!) 22  Temp:  97.8 F (36.6 C)  SpO2: 92% 92%

## 2024-11-16 NOTE — Plan of Care (Addendum)
 [facility] emergency department to [facility] transfer to [facility] progressive unit transfer:  73 year old female past medical history of asthma, chronic hemorrhoid, history of pathological fracture of left femur with chronic osteoporosis, history of easy bruising and muscle cramp, and may have chronic disease, hyperlipidemia, GERD, iron deficiency anemia who has been presented to  emergency department complaining of shortness of breath, fever, chill, cough and congestion.  Patient reported significant shortness of breath with coughing spell with voice aching, left-sided chest pain and posterior left upper back pain.  Patient reported 1 episode of hemoptysis/blood clot on Monday which resolved and currently having greenish sputum.  No episodes hemoptysis for last 48 hours.   ED physician reported that in the ED she dropped below 88% room air.  At presentation to ED patient is hemodynamically stable except blood pressure borderline soft.  Afebrile. Lab work, CBC showing leukocytosis 12.5 stable H&H 11.2 and 35.  BMP showed low percent 3.3.  Normal hepatic function panel.  Elevated lactic acid level 2.2.  Pending second lactic acid level.  COVID/flu/RSV negative.  UA no evidence of UTI.  Blood cultures are in process.  CTA chest no evidence of PE.  Multifocal pneumonia.  Hilar adenopathy. Chest x-ray showing airspace opacities/atelectasis and pneumonia.  In the ED code sepsis has been activated.  Patient received azithromycin , ceftriaxone , 500 mL of LR bolus and currently on maintenance fluid LR 150 cc/h.  Albuterol  nebulizer.  Hospitalist consulted for further eval for management of acute hypoxic respiratory failure in setting of asthma exacerbation, sepsis in the setting of pneumonia.  If patient develops any episodes of hemoptysis again need to consult pulmonology for further evaluation.     [facility] will assume care on arrival to accepting facility. Until arrival, care as per EDP. However,  [facility] available 24/7 for questions and assistance. Check [email or web address] for on-call coverage. Nursing staff, please call [facility] Admits & Consults System-Wide number under Amion on patient's arrival so appropriate admitting provider can evaluate the pt.   Author: Cindy Brindisi, MD  [facility]

## 2024-11-16 NOTE — ED Triage Notes (Signed)
 Reports SHOB x 3 days. Seen at [facility] and was told had a low o2 sat. 97& RA here. Productive and bloody cough.

## 2024-11-16 NOTE — ED Notes (Signed)
Repeat lactic after fluids

## 2024-11-16 NOTE — ED Provider Notes (Signed)
 " Veronica Barrett   CSN: [id number] Arrival date & time: 11/16/24  1402     Patient presents with: Shortness of Breath   Veronica Barrett is a 73 y.o. female.   Patient is a 73 year old female with a history of asthma, arthritis, depression who presents with fever and cough with hemoptysis.  She started feeling bad 2 days ago with fever up to 104 and productive cough.  She said at that time her cough was sputum mixed with blood.  She said yesterday it turned brown and today she has not noticed any blood in her urine and.  However it is yellow and green.  She has had increased shortness of breath.  She has not used her albuterol  at home.  She went to urgent care today and was noted to be hypoxic.  Per daughter, her oxygen saturation was 88 to 90% on nasal cannula oxygen in the urgent care.  Per the urgent care notes, it improved after a DuoNeb treatment.  She started having some rhinorrhea today.  No vomiting.  She is having some diarrhea.       Prior to Admission medications  Medication Sig Start Date End Date Taking? Authorizing Provider  acetaminophen  (TYLENOL ) 325 MG tablet Take 650 mg by mouth every 6 (six) hours as needed. 01/25/24   [provider]  acyclovir  (ZOVIRAX ) 200 MG capsule TAKE 1 CAPSULE BY MOUTH 5 TIMES DAILY AS NEEDED FOR BREAKOUTS 04/05/24   Chandra Toribio POUR, MD  albuterol  (VENTOLIN  HFA) 108 [phone] Base) MCG/ACT inhaler Inhale 2 puffs into the lungs every 6 (six) hours as needed for wheezing or shortness of breath. 07/13/23   Hunsucker, Donnice SAUNDERS, MD  alendronate  (FOSAMAX ) 70 MG tablet Take 70 mg by mouth once a week.    [provider]  ascorbic acid (VITAMIN C) 500 MG tablet Take 500 mg by mouth daily. 01/25/24 01/24/25  [provider]  ASPIRIN  LOW DOSE 81 MG tablet SMARTSIG:1 Tablet(s) By Mouth Morning-Night 01/25/24   [provider]  atorvastatin  (LIPITOR) 20 MG tablet Take 1 tablet by  mouth once daily 10/25/24   Clapp, Kara F, PA-C  budesonide -formoterol  (SYMBICORT) 160-4.5 MCG/ACT inhaler Inhale 2 puffs into the lungs as directed.    [provider]  calcium  citrate (CALCITRATE - DOSED IN MG ELEMENTAL CALCIUM ) 950 (200 Ca) MG tablet Take 950 mg by mouth 2 (two) times daily. 01/25/24   [provider]  cetirizine (ZYRTEC) 10 MG tablet Take 10 mg by mouth daily.    [provider]  citalopram  (CELEXA ) 20 MG tablet TAKE 1 & 1/2 (ONE & ONE-HALF) TABLETS BY MOUTH ONCE DAILY 07/25/24   Gayle Numbers F, PA-C  Difluprednate 0.05 % EMUL Place 1 drop into the left eye 4 (four) times daily. 04/26/24   [provider]  famotidine (PEPCID) 20 MG tablet Take 20 mg by mouth daily. 01/25/24 01/24/25  [provider]  ferrous gluconate (FERGON) 324 MG tablet Take 324 mg by mouth daily with breakfast. 01/25/24 01/24/25  [provider]  fluticasone  (FLONASE ) 50 MCG/ACT nasal spray Place 1 spray into both nostrils daily.    [provider]  fluticasone -salmeterol (ADVAIR) 250-50 MCG/ACT AEPB Inhale 1 puff into the lungs in the morning and at bedtime.    [provider]  gatifloxacin (ZYMAXID) 0.5 % SOLN Place 1 drop into the left eye 4 (four) times daily. 04/26/24   [provider]  hydrocortisone  (ANUSOL -HC) 25  MG suppository Place 1 suppository (25 mg total) rectally 2 (two) times daily as needed for hemorrhoids or anal itching. 08/08/24   Gayle [name], PA-C  ipratropium-albuterol  (DUONEB) 0.5-2.5 (3) MG/3ML SOLN Take 3 mLs by nebulization every 6 (six) hours as needed (shortness of breath, cough or wheeze). 12/07/23   Hunsucker, Donnice SAUNDERS, MD  ketorolac  (ACULAR ) 0.5 % ophthalmic solution Place 1 drop into the left eye 4 (four) times daily. 04/26/24   [provider]  mometasone -formoterol  (DULERA ) 200-5 MCG/ACT AERO Inhale 2 puffs into the lungs daily. 05/30/24   Hunsucker, Donnice SAUNDERS, MD  montelukast  (SINGULAIR ) 10 MG  tablet TAKE 1 TABLET BY MOUTH AT BEDTIME 07/26/24   Gayle [name], PA-C  Nebulizer System All-In-One MISC Use with nebulized albuterol  once every 4-6 hours for wheezing and shortness of breath. 06/09/21   Christopher Savannah, PA-C  pantoprazole  (PROTONIX ) 40 MG tablet Take 1 tablet (40 mg total) by mouth daily. 11/05/21   Hunsucker, Donnice SAUNDERS, MD  predniSONE  (DELTASONE ) 10 MG tablet Take 10 mg by mouth as directed. 09/21/23   [provider]  Respiratory Therapy Supplies (NEBULIZER MASK ADULT) MISC 1 Inhaler by Does not apply route every 4 (four) hours as needed. 06/09/21   Christopher Savannah, PA-C  senna (SENOKOT) 8.6 MG tablet Take 17.2 mg by mouth daily. 01/25/24 01/24/25  [provider]    Allergies: Povidone iodine, Seasonal ic [cholestatin], Tape, and Wound dressing adhesive    Review of Systems  Constitutional:  Positive for fatigue and fever. Negative for chills and diaphoresis.  HENT:  Positive for congestion and rhinorrhea. Negative for sneezing.   Eyes: Negative.   Respiratory:  Positive for cough, chest tightness and shortness of breath.   Cardiovascular:  Negative for chest pain and leg swelling.  Gastrointestinal:  Positive for diarrhea. Negative for abdominal pain, nausea and vomiting.  Genitourinary:  Negative for difficulty urinating, flank pain and frequency.  Musculoskeletal:  Positive for myalgias. Negative for arthralgias and back pain.  Skin:  Negative for rash.  Neurological:  Negative for dizziness, speech difficulty, weakness, numbness and headaches.    Updated Vital Signs BP (!) 100/52   Pulse 75   Temp 98.7 F (37.1 C) (Oral)   Resp 14   SpO2 95%   Physical Exam Constitutional:      Appearance: She is well-developed. She is ill-appearing.  HENT:     Head: Normocephalic and atraumatic.  Eyes:     Pupils: Pupils are equal, round, and reactive to light.  Cardiovascular:     Rate and Rhythm: Normal rate and regular rhythm.     Heart sounds: Normal heart  sounds.  Pulmonary:     Effort: Pulmonary effort is normal. No respiratory distress.     Breath sounds: Decreased breath sounds, wheezing and rhonchi present. No rales.  Chest:     Chest wall: No tenderness.  Abdominal:     General: Bowel sounds are normal.     Palpations: Abdomen is soft.     Tenderness: There is no abdominal tenderness. There is no guarding or rebound.  Musculoskeletal:        General: Normal range of motion.     Cervical back: Normal range of motion and neck supple.     Comments: No edema or calf tenderness  Lymphadenopathy:     Cervical: No cervical adenopathy.  Skin:    General: Skin is warm and dry.     Findings: No rash.  Neurological:  Mental Status: She is alert and oriented to person, place, and time.     (all labs ordered are listed, but only abnormal results are displayed) Labs Reviewed  BASIC METABOLIC PANEL WITH GFR - Abnormal; Notable for the following components:      Result Value   Potassium 3.3 (*)    Glucose, Bld 129 (*)    All other components within normal limits  CBC - Abnormal; Notable for the following components:   WBC 12.5 (*)    RBC 3.86 (*)    Hemoglobin 11.9 (*)    HCT 35.6 (*)    All other components within normal limits  LACTIC ACID, PLASMA - Abnormal; Notable for the following components:   Lactic Acid, Venous 2.2 (*)    All other components within normal limits  URINALYSIS, W/ REFLEX TO CULTURE (INFECTION SUSPECTED) - Abnormal; Notable for the following components:   Color, Urine STRAW (*)    Hgb urine dipstick TRACE (*)    Protein, ur TRACE (*)    Leukocytes,Ua TRACE (*)    All other components within normal limits  HEPATIC FUNCTION PANEL - Abnormal; Notable for the following components:   Bilirubin, Direct 0.3 (*)    All other components within normal limits  RESP PANEL BY RT-PCR (RSV, FLU A&B, COVID)  RVPGX2  CULTURE, BLOOD (ROUTINE X 2)  CULTURE, BLOOD (ROUTINE X 2)  LACTIC ACID, PLASMA  PROTIME-INR     EKG: EKG Interpretation Date/Time:  Wednesday November 16 2024 15:57:37 EST Ventricular Rate:  71 PR Interval:  183 QRS Duration:  101 QT Interval:  404 QTC Calculation: 439 R Axis:   13  Text Interpretation: Sinus rhythm Abnormal R-wave progression, early transition similar to prior EKG Confirmed by Lenor Hollering [phone]) on 11/16/2024 4:40:02 PM  Radiology: CT Angio Chest PE W/Cm &/Or Wo Cm Result Date: 11/16/2024 EXAM: CTA CHEST 11/16/2024 05:53:20 PM TECHNIQUE: CTA of the chest was performed without and with the administration of 75 mL of iohexol (OMNIPAQUE) 350 MG/ML injection. Multiplanar reformatted images are provided for review. MIP images are provided for review. Automated exposure control, iterative reconstruction, and/or weight based adjustment of the mA/kV was utilized to reduce the radiation dose to as low as reasonably achievable. COMPARISON: None available. CLINICAL HISTORY: Pulmonary embolism (PE) suspected, high prob. FINDINGS: PULMONARY ARTERIES: Pulmonary arteries are adequately opacified for evaluation. No acute pulmonary embolus. Main pulmonary artery is normal in caliber. MEDIASTINUM: The heart and pericardium demonstrate no acute abnormality. There is no acute abnormality of the thoracic aorta. LYMPH NODES: There is a mildly enlarged left hilar lymph node measuring 11 mm. Mildly enlarged right hilar lymph node also measures 11 mm. LUNGS AND PLEURA: There is airspace consolidation throughout the mid and lower aspect of the left lower lobe. There is additional patchy multifocal ground glass opacity in the bilateral upper lobes, right lower lobe and right middle lobe. There are bands of atelectasis in the right lower lobe. No evidence of pleural effusion or pneumothorax. UPPER ABDOMEN: Limited images of the upper abdomen are unremarkable. SOFT TISSUES AND BONES: No acute bone or soft tissue abnormality. IMPRESSION: 1. No evidence of pulmonary embolism. 2. Multifocal  pneumonia with dense consolidation throughout the left lower lobe. 3. Mildly enlarged bilateral hilar lymph nodes, each measuring 11 mm, likely reactive. Electronically signed by: Greig Pique MD 11/16/2024 07:13 PM EST RP Workstation: [id number]   DG Chest 2 View Result Date: 11/16/2024 EXAM: 2 VIEW(S) XRAY OF THE CHEST 11/16/2024 12:34:15 PM COMPARISON: 08/10/2023  CLINICAL HISTORY: wheezing, decreased O2, decreased lung sounds in bases FINDINGS: LUNGS AND PLEURA: Airspace opacity at the left lung base, predominantly platelike favoring atelectasis although cannot completely exclude retrocardiac pneumonia. Right lung clear. No pleural effusion. No pneumothorax. HEART AND MEDIASTINUM: Aortic atherosclerosis. No acute abnormality of the cardiac and mediastinal silhouettes. BONES AND SOFT TISSUES: No acute osseous abnormality. IMPRESSION: 1. Airspace opacity at the left lung base, predominantly platelike, favoring atelectasis; retrocardiac pneumonia cannot be completely excluded. Electronically signed by: Franky Crease MD 11/16/2024 01:08 PM EST RP Workstation: [id number]     Procedures   Medications Ordered in the ED  lactated ringers  infusion (has no administration in time range)  lactated ringers  bolus 1,000 mL (has no administration in time range)  lactated ringers  bolus 1,000 mL (has no administration in time range)  albuterol  (PROVENTIL ) (2.5 MG/3ML) 0.083% nebulizer solution 5 mg (5 mg Nebulization Given 11/16/24 1638)  iohexol (OMNIPAQUE) 350 MG/ML injection 75 mL (75 mLs Intravenous Contrast Given 11/16/24 1742)  lactated ringers  bolus 500 mL (0 mLs Intravenous Stopped 11/16/24 1904)  cefTRIAXone  (ROCEPHIN ) 2 g in sodium chloride  0.9 % 100 mL IVPB (0 g Intravenous Stopped 11/16/24 2001)  azithromycin  (ZITHROMAX ) 500 mg in sodium chloride  0.9 % 250 mL IVPB (0 mg Intravenous Stopped 11/16/24 1907)  ondansetron  (ZOFRAN ) injection 4 mg (4 mg Intravenous Given 11/16/24 1811)                                     Medical Decision Making Amount and/or Complexity of Data Reviewed Labs: ordered. Radiology: ordered.  Risk Prescription drug management. Decision regarding hospitalization.   This patient presents to the ED for concern of cough, hemoptysis, this involves an extensive number of treatment options, and is a complaint that carries with it a high risk of complications and morbidity.  I considered the following differential and admission for this acute, potentially life threatening condition.  The differential diagnosis includes pneumonia, influenza, asthma exacerbation, viral infection, bronchitis, PE, pulmonary edema  MDM:    Patient is a 73 year old with a history of asthma who presents with febrile illness with cough and hemoptysis.  On my exam, she is tachypneic with decreased breath sounds.  Her oxygenation is between 88 and 90% on room air.  She was placed on nasal cannula oxygen.  She was given a nebulizer treatment.  Her breathing has improved after this.  Given her hemoptysis, CT scan was performed.  There is no evidence of PE or lung mass.  There is large area of left lung consolidation consistent with pneumonia.  She was started on IV antibiotics.  Her blood pressures been a little bit on the low side in the 90s and 100s.  Her lactic acid was elevated.  Her WBC count was elevated.  Sepsis treatment was initiated with IV fluids and IV antibiotics.  Discussed with Dr. Sundil who will admit the patient for further treatment.  CRITICAL CARE Performed by: Andrea Ness Total critical care time: 70 minutes Critical care time was exclusive of separately billable procedures and treating other patients. Critical care was necessary to treat or prevent imminent or life-threatening deterioration. Critical care was time spent personally by me on the following activities: development of treatment plan with patient and/or surrogate as well as nursing, discussions with consultants,  evaluation of patient's response to treatment, examination of patient, obtaining history from patient or surrogate, ordering and performing treatments and interventions, ordering  and review of laboratory studies, ordering and review of radiographic studies, pulse oximetry and re-evaluation of patient's condition.   (Labs, imaging, consults)  Labs: I Ordered, and personally interpreted labs.  The pertinent results include: Elevated WBC count, elevated lactic acid  Imaging Studies ordered: I ordered imaging studies including CT angio chest I independently visualized and interpreted imaging. I agree with the radiologist interpretation  Additional history obtained from daughter at bedside.  External records from outside source obtained and reviewed including prior notes  Cardiac Monitoring: The patient was maintained on a cardiac monitor.  If on the cardiac monitor, I personally viewed and interpreted the cardiac monitored which showed an underlying rhythm of: [place] rhythm  Reevaluation: After the interventions noted above, I reevaluated the patient and found that they have :improved  Social Determinants of Health:    Disposition: Admit to hospital  Co morbidities that complicate the patient evaluation  Past Medical History:  Diagnosis Date   Allergy    seasonal allergy   Arthritis    left hip   Asthma    Depression    HSV infection    vag     Medicines Meds ordered this encounter  Medications   albuterol  (PROVENTIL ) (2.5 MG/3ML) 0.083% nebulizer solution 5 mg   iohexol (OMNIPAQUE) 350 MG/ML injection 75 mL   lactated ringers  infusion   lactated ringers  bolus 500 mL    Reason 30 mL/kg dose is not being ordered:   Non-Septic Shock Clinical Presentation   cefTRIAXone  (ROCEPHIN ) 2 g in sodium chloride  0.9 % 100 mL IVPB    Antibiotic Indication::   CAP   azithromycin  (ZITHROMAX ) 500 mg in sodium chloride  0.9 % 250 mL IVPB    Antibiotic Indication::   CAP   ondansetron   (ZOFRAN ) injection 4 mg   lactated ringers  bolus 1,000 mL   lactated ringers  bolus 1,000 mL    I have reviewed the patients home medicines and have made adjustments as needed  Problem List / ED Course: Problem List Items Addressed This Visit   None Visit Diagnoses       Community acquired pneumonia of left lung, unspecified part of lung    -  Primary   Relevant Medications   albuterol  (PROVENTIL ) (2.5 MG/3ML) 0.083% nebulizer solution 5 mg (Completed)   cefTRIAXone  (ROCEPHIN ) 2 g in sodium chloride  0.9 % 100 mL IVPB (Completed)   azithromycin  (ZITHROMAX ) 500 mg in sodium chloride  0.9 % 250 mL IVPB (Completed)     Sepsis without acute organ dysfunction, due to unspecified organism [facility])                    Final diagnoses:  Community acquired pneumonia of left lung, unspecified part of lung  Sepsis without acute organ dysfunction, due to unspecified organism [facility])    ED Discharge Orders     None          Lenor Hollering, MD 11/16/24 2011  "

## 2024-11-16 NOTE — ED Notes (Signed)
 PA made aware of patient's O2 stat.

## 2024-11-16 NOTE — ED Provider Notes (Signed)
 " [facility]    CSN: [id number] Arrival date & time: 11/16/24  1029      History   Chief Complaint Chief Complaint  Patient presents with   Shortness of Breath    HPI Veronica Barrett is a 73 y.o. female.   HPI  Pt is here today with concerns of fever, coughing up blood x one day, productive cough of brownish green phlegm She states tmax at home was 104 but did respond to tylenol . She also expresses concern for chills, SOB and fatigue.  She states she was coughing up blood on Monday (12/29) and states it started as frank blood then became blood mixed with mucus and she has not had this recur since that day. She states yesterday she was having back pain and left sided chest pain but this has improved a lot today.  Interventions: Tylenol  She has used her Dulera  inhaler - 2 times per day- AM and PM. She has not used her rescue inhaler during illness     Past Medical History:  Diagnosis Date   Allergy    seasonal allergy   Arthritis    left hip   Asthma    Depression    HSV infection    vag    Patient Active Problem List   Diagnosis Date Noted   Vasomotor rhinitis 09/29/2024   Moderate persistent asthma with exacerbation 09/29/2024   Moderate persistent asthma without complication 09/29/2024   Allergic rhinitis, unspecified 09/29/2024   Acute and chronic respiratory failure with hypoxia (HCC) 08/08/2024   GERD (gastroesophageal reflux disease) 08/08/2024   Iron deficiency anemia 08/08/2024   Leg cramps 08/08/2024   Rectal bleeding 08/08/2024   Postoperative anemia due to acute blood loss 01/12/2024   At risk for deep venous thrombosis 01/12/2024   Age-related osteoporosis with current pathological fracture, left femur, subsequent encounter for fracture with routine healing 01/11/2024   Closed displaced oblique fracture of shaft of left femur with routine healing 01/10/2024   Osteoporosis without current pathological fracture 10/12/2023   Moderate  persistent asthma 08/10/2023   Bronchitis 06/18/2021   Leukocytosis 06/17/2021   Environmental and seasonal allergies 10/07/2018   Chronic sinusitis 10/07/2018   H/O sinus surgery 10/07/2018   Hyperlipidemia 10/07/2018   S/P left THA, AA 10/17/2014   Recurrent HSV (herpes simplex virus) 04/21/2013   Depression, recurrent 02/20/2010   Allergic rhinitis 04/16/2007    Past Surgical History:  Procedure Laterality Date   APPENDECTOMY  11/17/1982   EYE SURGERY Left    left vitreous   LEG SURGERY Left 01/10/2024   TOTAL HIP ARTHROPLASTY Left 10/17/2014   Procedure: LEFT TOTAL HIP ARTHROPLASTY ANTERIOR APPROACH;  Surgeon: Donnice JONETTA Car, MD;  Location: [facility];  Service: Orthopedics;  Laterality: Left;    OB History     Gravida      Para      Term      Preterm      AB      Living  2      SAB      IAB      Ectopic      Multiple      Live Births               Home Medications    Prior to Admission medications  Medication Sig Start Date End Date Taking? Authorizing Provider  acetaminophen  (TYLENOL ) 325 MG tablet Take 650 mg by mouth every 6 (six) hours as needed. 01/25/24  [provider]  acyclovir  (ZOVIRAX ) 200 MG capsule TAKE 1 CAPSULE BY MOUTH 5 TIMES DAILY AS NEEDED FOR BREAKOUTS 04/05/24   Chandra Toribio POUR, MD  albuterol  (VENTOLIN  HFA) 108 [phone] Base) MCG/ACT inhaler Inhale 2 puffs into the lungs every 6 (six) hours as needed for wheezing or shortness of breath. 07/13/23   Hunsucker, Donnice SAUNDERS, MD  alendronate  (FOSAMAX ) 70 MG tablet Take 70 mg by mouth once a week.    [provider]  ascorbic acid (VITAMIN C) 500 MG tablet Take 500 mg by mouth daily. 01/25/24 01/24/25  [provider]  ASPIRIN  LOW DOSE 81 MG tablet SMARTSIG:1 Tablet(s) By Mouth Morning-Night 01/25/24   [provider]  atorvastatin  (LIPITOR) 20 MG tablet Take 1 tablet by mouth once daily 10/25/24   Clapp, Kara F, PA-C  budesonide -formoterol  (SYMBICORT) 160-4.5  MCG/ACT inhaler Inhale 2 puffs into the lungs as directed.    [provider]  calcium  citrate (CALCITRATE - DOSED IN MG ELEMENTAL CALCIUM ) 950 (200 Ca) MG tablet Take 950 mg by mouth 2 (two) times daily. 01/25/24   [provider]  cetirizine (ZYRTEC) 10 MG tablet Take 10 mg by mouth daily.    [provider]  citalopram  (CELEXA ) 20 MG tablet TAKE 1 & 1/2 (ONE & ONE-HALF) TABLETS BY MOUTH ONCE DAILY 07/25/24   Gayle Numbers F, PA-C  Difluprednate 0.05 % EMUL Place 1 drop into the left eye 4 (four) times daily. 04/26/24   [provider]  famotidine (PEPCID) 20 MG tablet Take 20 mg by mouth daily. 01/25/24 01/24/25  [provider]  ferrous gluconate (FERGON) 324 MG tablet Take 324 mg by mouth daily with breakfast. 01/25/24 01/24/25  [provider]  fluticasone  (FLONASE ) 50 MCG/ACT nasal spray Place 1 spray into both nostrils daily.    [provider]  fluticasone -salmeterol (ADVAIR) 250-50 MCG/ACT AEPB Inhale 1 puff into the lungs in the morning and at bedtime.    [provider]  gatifloxacin (ZYMAXID) 0.5 % SOLN Place 1 drop into the left eye 4 (four) times daily. 04/26/24   [provider]  hydrocortisone  (ANUSOL -HC) 25 MG suppository Place 1 suppository (25 mg total) rectally 2 (two) times daily as needed for hemorrhoids or anal itching. 08/08/24   Gayle Numbers FALCON, PA-C  ipratropium-albuterol  (DUONEB) 0.5-2.5 (3) MG/3ML SOLN Take 3 mLs by nebulization every 6 (six) hours as needed (shortness of breath, cough or wheeze). 12/07/23   Hunsucker, Donnice SAUNDERS, MD  ketorolac  (ACULAR ) 0.5 % ophthalmic solution Place 1 drop into the left eye 4 (four) times daily. 04/26/24   [provider]  mometasone -formoterol  (DULERA ) 200-5 MCG/ACT AERO Inhale 2 puffs into the lungs daily. 05/30/24   Hunsucker, Donnice SAUNDERS, MD  montelukast  (SINGULAIR ) 10 MG tablet TAKE 1 TABLET BY MOUTH AT BEDTIME 07/26/24   Gayle Numbers FALCON, PA-C  Nebulizer System  All-In-One MISC Use with nebulized albuterol  once every 4-6 hours for wheezing and shortness of breath. 06/09/21   Christopher Savannah, PA-C  pantoprazole  (PROTONIX ) 40 MG tablet Take 1 tablet (40 mg total) by mouth daily. 11/05/21   Hunsucker, Donnice SAUNDERS, MD  predniSONE  (DELTASONE ) 10 MG tablet Take 10 mg by mouth as directed. 09/21/23   [provider]  Respiratory Therapy Supplies (NEBULIZER MASK ADULT) MISC 1 Inhaler by Does not apply route every 4 (four) hours as needed. 06/09/21   Christopher Savannah, PA-C  senna (SENOKOT) 8.6 MG tablet Take 17.2 mg by mouth daily. 01/25/24 01/24/25  [provider]  Family History Family History  Problem Relation Age of Onset   Cancer Father    Cancer Sister    Thyroid  cancer Sister    Arthritis Other    Colon cancer Neg Hx     Social History Social History[1]   Allergies   Povidone iodine, Seasonal ic [cholestatin], Tape, and Wound dressing adhesive   Review of Systems Review of Systems  Constitutional:  Positive for chills, fatigue and fever.  Respiratory:  Positive for cough and shortness of breath.      Physical Exam Triage Vital Signs ED Triage Vitals  Encounter Vitals Group     BP 11/16/24 1103 (!) 142/91     Girls Systolic BP Percentile --      Girls Diastolic BP Percentile --      Boys Systolic BP Percentile --      Boys Diastolic BP Percentile --      Pulse Rate 11/16/24 1103 65     Resp 11/16/24 1103 16     Temp 11/16/24 1103 98.4 F (36.9 C)     Temp Source 11/16/24 1103 Oral     SpO2 11/16/24 1103 93 %     Weight 11/16/24 1103 163 lb (73.9 kg)     Height --      Head Circumference --      Peak Flow --      Pain Score 11/16/24 1110 2     Pain Loc --      Pain Education --      Exclude from Growth Chart --    No data found.  Updated Vital Signs BP 99/66 (BP Location: Right Arm)   Pulse 62   Temp 97.8 F (36.6 C) (Oral)   Resp (!) 22 Comment: respirations have increased. PA will be made aware.  Wt 163 lb  (73.9 kg)   SpO2 92%   BMI 27.12 kg/m   Visual Acuity Right Eye Distance:   Left Eye Distance:   Bilateral Distance:    Right Eye Near:   Left Eye Near:    Bilateral Near:     Physical Exam Constitutional:      General: She is awake. She is not in acute distress.    Appearance: Normal appearance. She is well-developed and well-groomed. She is not ill-appearing, toxic-appearing or diaphoretic.  Cardiovascular:     Rate and Rhythm: Normal rate and regular rhythm.     Heart sounds: Normal heart sounds. No murmur heard.    No friction rub. No gallop.  Pulmonary:     Effort: Pulmonary effort is normal. No tachypnea, bradypnea, respiratory distress or retractions.     Breath sounds: Decreased air movement present. Examination of the right-upper field reveals wheezing. Examination of the left-upper field reveals wheezing. Examination of the right-lower field reveals decreased breath sounds. Examination of the left-lower field reveals decreased breath sounds. Decreased breath sounds and wheezing present.     Comments: Patient has wheezes particularly in the upper lung fields as well as decreased air movement decreased breath sounds in the lung bases bilaterally. This seemed to improve significantly after DuoNeb administration Skin:    General: Skin is warm and dry.  Neurological:     General: No focal deficit present.     Mental Status: She is alert and oriented to person, place, and time.  Psychiatric:        Mood and Affect: Mood normal.        Behavior: Behavior normal. Behavior is cooperative.  Thought Content: Thought content normal.        Judgment: Judgment normal.      UC Treatments / Results  Labs (all labs ordered are listed, but only abnormal results are displayed) Labs Reviewed  POC COVID19/FLU A&B COMBO - Normal    EKG   Radiology DG Chest 2 View Result Date: 11/16/2024 EXAM: 2 VIEW(S) XRAY OF THE CHEST 11/16/2024 12:34:15 PM COMPARISON: 08/10/2023  CLINICAL HISTORY: wheezing, decreased O2, decreased lung sounds in bases FINDINGS: LUNGS AND PLEURA: Airspace opacity at the left lung base, predominantly platelike favoring atelectasis although cannot completely exclude retrocardiac pneumonia. Right lung clear. No pleural effusion. No pneumothorax. HEART AND MEDIASTINUM: Aortic atherosclerosis. No acute abnormality of the cardiac and mediastinal silhouettes. BONES AND SOFT TISSUES: No acute osseous abnormality. IMPRESSION: 1. Airspace opacity at the left lung base, predominantly platelike, favoring atelectasis; retrocardiac pneumonia cannot be completely excluded. Electronically signed by: Franky Crease MD 11/16/2024 01:08 PM EST RP Workstation: [id number]    Procedures Procedures (including critical care time)  Medications Ordered in UC Medications  ipratropium-albuterol  (DUONEB) 0.5-2.5 (3) MG/3ML nebulizer solution 3 mL (3 mLs Nebulization Given 11/16/24 1219)    Initial Impression / Assessment and Plan / UC Course  I have reviewed the triage vital signs and the nursing notes.  Pertinent labs & imaging results that were available during my care of the patient were reviewed by me and considered in my medical decision making (see chart for details).     Patient was reassessed after DuoNeb administration and oxygen saturation had improved to 94 to 97% during pulmonary assessment on room air.  Patient reports that she feels like she is breathing a little bit better.  Final Clinical Impressions(s) / UC Diagnoses   Final diagnoses:  Low oxygen saturation  Wheezing  SOB (shortness of breath)   Patient presents today with concerns of shortness of breath, coughing, hemoptysis that has been ongoing since Monday.  She reports hemoptysis has largely resolved but she is still having significant shortness of breath and coughing.  Physical exam is notable for decreased air movement, wheezes that do not resolve with coughing.  Oxygen saturation has been  consistently below 93% without oxygen supplementation and only improved for about 15 minutes after DuoNeb administration.  After DuoNeb administration respiratory rate increased and oxygen saturation decreased again.  Rapid testing is negative for COVID and flu here in clinic. Chest x-ray was concerning for an airspace opacity at the left lung base predominantly platelike favoring atelectasis but retrocardiac pneumonia cannot be completely excluded per radiology interpretation.  Given findings as well as lack of improvement with medications here in clinic I recommend that she goes to the ER for evaluation and stabilization.  Reviewed my initial interpretation of x-ray with patient prior to discharge as well as my concerns for lack of improvement and stabilization with resources here in clinic.  Reviewed my recommendation for evaluation in the ER.  Patient and her companion voiced understanding with recommendation.  They declined EMS transportation and state they will go via private vehicle.  Recommend follow-up with PCP for further evaluation and ongoing management after ER visit.  Follow-up as needed.    Discharge Instructions      You were seen today for concerns of shortness of breath, coughing and wheezing.  Your oxygen saturation has stayed consistently below 95% and with not on oxygen it has been between 89 to 91%.  This did not improve despite giving a breathing treatment and my review of your  x-ray is concerning for potential pneumonia in your left lower lung field.  There are still waiting on radiology to review your x-ray for final interpretation.  Since your symptoms are not improving and we have limited resources here in urgent care I am recommending that you go to the emergency room for evaluation, stabilization and management.  You have declined EMS transportation and states that you will go via private vehicle.  If at any point on the way to the emergency room he started to feel like you are  having significant difficulty breathing, chest pain, loss of consciousness please call 911 for assistance.     ED Prescriptions   None    PDMP not reviewed this encounter.     [1]  Social History Tobacco Use   Smoking status: Never    Passive exposure: Never   Smokeless tobacco: Never  Vaping Use   Vaping status: Never Used  Substance Use Topics   Alcohol use: Yes    Alcohol/week: 10.0 standard drinks of alcohol    Types: 10 Glasses of wine per week   Drug use: No     Isiac Breighner, Rocky BRAVO, PA-C 11/16/24 1327  "

## 2024-11-16 NOTE — ED Triage Notes (Signed)
 Pt presents with a chief complaint of shortness of breath. This is day three of symptoms. Also endorses fevers, chills, cough, and congestion. Becomes more SOB after coughing spells. Voices aching pain on left side of chest and posterior left upper back. 93% O2 room air. Hx of asthma. Does endorse sick contacts. Spit up bright red blood on Monday 12/29. Went on for hours. Did not occur yesterday or today.

## 2024-11-17 ENCOUNTER — Encounter (HOSPITAL_BASED_OUTPATIENT_CLINIC_OR_DEPARTMENT_OTHER): Payer: Self-pay | Admitting: Internal Medicine

## 2024-11-17 DIAGNOSIS — J188 Other pneumonia, unspecified organism: Secondary | ICD-10-CM | POA: Insufficient documentation

## 2024-11-17 DIAGNOSIS — Z809 Family history of malignant neoplasm, unspecified: Secondary | ICD-10-CM | POA: Diagnosis not present

## 2024-11-17 DIAGNOSIS — Z1152 Encounter for screening for COVID-19: Secondary | ICD-10-CM | POA: Diagnosis not present

## 2024-11-17 DIAGNOSIS — E876 Hypokalemia: Secondary | ICD-10-CM

## 2024-11-17 DIAGNOSIS — J4541 Moderate persistent asthma with (acute) exacerbation: Secondary | ICD-10-CM | POA: Diagnosis present

## 2024-11-17 DIAGNOSIS — F339 Major depressive disorder, recurrent, unspecified: Secondary | ICD-10-CM | POA: Diagnosis present

## 2024-11-17 DIAGNOSIS — B9781 Human metapneumovirus as the cause of diseases classified elsewhere: Secondary | ICD-10-CM | POA: Diagnosis present

## 2024-11-17 DIAGNOSIS — R6520 Severe sepsis without septic shock: Secondary | ICD-10-CM | POA: Diagnosis present

## 2024-11-17 DIAGNOSIS — J189 Pneumonia, unspecified organism: Secondary | ICD-10-CM | POA: Diagnosis present

## 2024-11-17 DIAGNOSIS — K649 Unspecified hemorrhoids: Secondary | ICD-10-CM

## 2024-11-17 DIAGNOSIS — D649 Anemia, unspecified: Secondary | ICD-10-CM | POA: Diagnosis not present

## 2024-11-17 DIAGNOSIS — Z7983 Long term (current) use of bisphosphonates: Secondary | ICD-10-CM | POA: Diagnosis not present

## 2024-11-17 DIAGNOSIS — E872 Acidosis, unspecified: Secondary | ICD-10-CM | POA: Diagnosis present

## 2024-11-17 DIAGNOSIS — Z888 Allergy status to other drugs, medicaments and biological substances status: Secondary | ICD-10-CM | POA: Diagnosis not present

## 2024-11-17 DIAGNOSIS — K219 Gastro-esophageal reflux disease without esophagitis: Secondary | ICD-10-CM | POA: Diagnosis present

## 2024-11-17 DIAGNOSIS — A419 Sepsis, unspecified organism: Secondary | ICD-10-CM | POA: Diagnosis present

## 2024-11-17 DIAGNOSIS — E785 Hyperlipidemia, unspecified: Secondary | ICD-10-CM | POA: Diagnosis present

## 2024-11-17 DIAGNOSIS — Z96642 Presence of left artificial hip joint: Secondary | ICD-10-CM | POA: Diagnosis present

## 2024-11-17 DIAGNOSIS — R918 Other nonspecific abnormal finding of lung field: Secondary | ICD-10-CM | POA: Diagnosis not present

## 2024-11-17 DIAGNOSIS — Z8261 Family history of arthritis: Secondary | ICD-10-CM | POA: Diagnosis not present

## 2024-11-17 DIAGNOSIS — J9601 Acute respiratory failure with hypoxia: Secondary | ICD-10-CM | POA: Diagnosis present

## 2024-11-17 DIAGNOSIS — Z79899 Other long term (current) drug therapy: Secondary | ICD-10-CM | POA: Diagnosis not present

## 2024-11-17 DIAGNOSIS — Z7951 Long term (current) use of inhaled steroids: Secondary | ICD-10-CM | POA: Diagnosis not present

## 2024-11-17 DIAGNOSIS — Z91048 Other nonmedicinal substance allergy status: Secondary | ICD-10-CM | POA: Diagnosis not present

## 2024-11-17 DIAGNOSIS — R042 Hemoptysis: Secondary | ICD-10-CM | POA: Diagnosis present

## 2024-11-17 DIAGNOSIS — D509 Iron deficiency anemia, unspecified: Secondary | ICD-10-CM | POA: Diagnosis present

## 2024-11-17 LAB — COMPREHENSIVE METABOLIC PANEL WITH GFR
ALT: 13 U/L (ref 0–44)
AST: 14 U/L — ABNORMAL LOW (ref 15–41)
Albumin: 3.5 g/dL (ref 3.5–5.0)
Alkaline Phosphatase: 86 U/L (ref 38–126)
Anion gap: 10 (ref 5–15)
BUN: 9 mg/dL (ref 8–23)
CO2: 22 mmol/L (ref 22–32)
Calcium: 8.3 mg/dL — ABNORMAL LOW (ref 8.9–10.3)
Chloride: 105 mmol/L (ref 98–111)
Creatinine, Ser: 0.46 mg/dL (ref 0.44–1.00)
GFR, Estimated: >60 mL/min
Glucose, Bld: 130 mg/dL — ABNORMAL HIGH (ref 70–99)
Potassium: 3.9 mmol/L (ref 3.5–5.1)
Sodium: 137 mmol/L (ref 135–145)
Total Bilirubin: 0.4 mg/dL (ref 0.0–1.2)
Total Protein: 6.1 g/dL — ABNORMAL LOW (ref 6.5–8.1)

## 2024-11-17 LAB — CBC
HCT: 32.7 % — ABNORMAL LOW (ref 36.0–46.0)
Hemoglobin: 10.6 g/dL — ABNORMAL LOW (ref 12.0–15.0)
MCH: 30.5 pg (ref 26.0–34.0)
MCHC: 32.4 g/dL (ref 30.0–36.0)
MCV: 94 fL (ref 80.0–100.0)
Platelets: 252 K/uL (ref 150–400)
RBC: 3.48 MIL/uL — ABNORMAL LOW (ref 3.87–5.11)
RDW: 13.9 % (ref 11.5–15.5)
WBC: 7.6 K/uL (ref 4.0–10.5)
nRBC: 0 % (ref 0.0–0.2)

## 2024-11-17 LAB — MAGNESIUM: Magnesium: 2.4 mg/dL (ref 1.7–2.4)

## 2024-11-17 MED ORDER — FERROUS GLUCONATE 324 (38 FE) MG PO TABS
324.0000 mg | ORAL_TABLET | Freq: Every day | ORAL | Status: DC
  Administered 2024-11-18 – 2024-11-22 (×5): 324 mg via ORAL
  Filled 2024-11-17 (×6): qty 1

## 2024-11-17 MED ORDER — SENNOSIDES-DOCUSATE SODIUM 8.6-50 MG PO TABS
1.0000 | ORAL_TABLET | Freq: Every evening | ORAL | Status: DC | PRN
  Filled 2024-11-17: qty 1

## 2024-11-17 MED ORDER — LACTATED RINGERS IV SOLN: INTRAVENOUS | Status: AC

## 2024-11-17 MED ORDER — ADULT MULTIVITAMIN W/MINERALS CH
1.0000 | ORAL_TABLET | Freq: Every day | ORAL | Status: DC
  Administered 2024-11-18 – 2024-11-22 (×5): 1 via ORAL
  Filled 2024-11-17 (×7): qty 1

## 2024-11-17 MED ORDER — ONDANSETRON HCL 4 MG PO TABS: 4.0000 mg | ORAL_TABLET | Freq: Four times a day (QID) | ORAL | Status: DC | PRN

## 2024-11-17 MED ORDER — ONDANSETRON HCL 4 MG/2ML IJ SOLN: 4.0000 mg | Freq: Four times a day (QID) | INTRAMUSCULAR | Status: DC | PRN

## 2024-11-17 MED ORDER — FLUTICASONE FUROATE-VILANTEROL 200-25 MCG/ACT IN AEPB: 1.0000 | INHALATION_SPRAY | Freq: Every day | RESPIRATORY_TRACT | Status: DC

## 2024-11-17 MED ORDER — HYDROCOD POLI-CHLORPHE POLI ER 10-8 MG/5ML PO SUER: 5.0000 mL | Freq: Every evening | ORAL | Status: DC | PRN

## 2024-11-17 MED ORDER — VITAMIN C 500 MG PO TABS
250.0000 mg | ORAL_TABLET | Freq: Every day | ORAL | Status: DC
  Administered 2024-11-18 – 2024-11-22 (×5): 250 mg via ORAL
  Filled 2024-11-17: qty 1
  Filled 2024-11-17 (×4): qty 0.5
  Filled 2024-11-17: qty 1

## 2024-11-17 MED ORDER — SODIUM CHLORIDE 0.9 % IV SOLN
500.0000 mg | INTRAVENOUS | Status: DC
  Administered 2024-11-17 – 2024-11-18 (×2): 500 mg via INTRAVENOUS
  Filled 2024-11-17 (×2): qty 5

## 2024-11-17 MED ORDER — ACETAMINOPHEN 650 MG RE SUPP: 650.0000 mg | Freq: Four times a day (QID) | RECTAL | Status: DC | PRN

## 2024-11-17 MED ORDER — ACETAMINOPHEN 325 MG PO TABS
650.0000 mg | ORAL_TABLET | Freq: Four times a day (QID) | ORAL | Status: DC | PRN
  Administered 2024-11-17 – 2024-11-18 (×2): 650 mg via ORAL
  Filled 2024-11-17 (×2): qty 2

## 2024-11-17 MED ORDER — HYDROCORTISONE ACETATE 25 MG RE SUPP
25.0000 mg | Freq: Two times a day (BID) | RECTAL | Status: DC | PRN
  Filled 2024-11-17: qty 1

## 2024-11-17 MED ORDER — IPRATROPIUM-ALBUTEROL 0.5-2.5 (3) MG/3ML IN SOLN
3.0000 mL | Freq: Four times a day (QID) | RESPIRATORY_TRACT | Status: AC
  Administered 2024-11-17 – 2024-11-18 (×3): 3 mL via RESPIRATORY_TRACT
  Filled 2024-11-17 (×3): qty 3

## 2024-11-17 MED ORDER — FAMOTIDINE 20 MG PO TABS
20.0000 mg | ORAL_TABLET | Freq: Every day | ORAL | Status: DC
  Administered 2024-11-18 – 2024-11-22 (×5): 20 mg via ORAL
  Filled 2024-11-17 (×6): qty 1

## 2024-11-17 MED ORDER — VITAMIN C 500 MG PO TABS
500.0000 mg | ORAL_TABLET | Freq: Every day | ORAL | Status: DC
  Administered 2024-11-17: 500 mg via ORAL
  Filled 2024-11-17: qty 1

## 2024-11-17 MED ORDER — OYSTER SHELL CALCIUM/D3 500-5 MG-MCG PO TABS
1.0000 | ORAL_TABLET | Freq: Every day | ORAL | Status: DC
  Administered 2024-11-18 – 2024-11-22 (×5): 1 via ORAL
  Filled 2024-11-17 (×7): qty 1

## 2024-11-17 MED ORDER — GUAIFENESIN 100 MG/5ML PO LIQD
5.0000 mL | ORAL | Status: DC | PRN
  Filled 2024-11-17: qty 5

## 2024-11-17 MED ORDER — SODIUM CHLORIDE 0.9 % IV SOLN
2.0000 g | INTRAVENOUS | Status: DC
  Administered 2024-11-17 – 2024-11-19 (×3): 2 g via INTRAVENOUS
  Filled 2024-11-17 (×5): qty 20

## 2024-11-17 MED ORDER — ATORVASTATIN CALCIUM 10 MG PO TABS
20.0000 mg | ORAL_TABLET | Freq: Every day | ORAL | Status: DC
  Administered 2024-11-18 – 2024-11-22 (×5): 20 mg via ORAL
  Filled 2024-11-17 (×7): qty 2

## 2024-11-17 MED ORDER — ENOXAPARIN SODIUM 40 MG/0.4ML IJ SOSY
40.0000 mg | PREFILLED_SYRINGE | INTRAMUSCULAR | Status: DC
  Administered 2024-11-17 – 2024-11-18 (×2): 40 mg via SUBCUTANEOUS
  Filled 2024-11-17 (×2): qty 0.4

## 2024-11-17 NOTE — Assessment & Plan Note (Addendum)
 11-17-2024 CAP Continue with azithromycin  500 mg IV daily, ceftriaxone  2 g IV daily to complete a 5-day course DuoNebs every 6 hours Albuterol  nebulizer every 4 hours as needed for wheezing and shortness of breath Flutter valve, incentive spirometry  11/19/2024 continue with IV rocephin  2 gram daily. Change to po zithromax . Continue with neb treatment. Start scheduled nebs at 9 am and 4 pm. Continue with prn nebs.  Continue tussionex to help with cough that is keeping her awake at night.

## 2024-11-17 NOTE — Assessment & Plan Note (Addendum)
 11/19/2024 continue citalopram  30 mg daily

## 2024-11-17 NOTE — Assessment & Plan Note (Addendum)
 11/19/2024 continue prn hydrocortisone  25 mg suppository anal itching, hemorrhoids

## 2024-11-17 NOTE — H&P (Addendum)
 " History and Physical - Telemedicine  RAEANNE DESCHLER FMW:[id number] DOB: July 17, 1951 DOA: 11/16/2024  PCP: Gayle [name], PA-C  Patient coming from: home  Referring provider: [place] EDP Telemedicine provider: Dr. Sherre Patient location: [facility] Referring diagnosis: multilobar pna Patient name and DOB verified: Patient was able to verify her first and last name: Veronica Barrett, date of birth: 1951/09/15.  Patient consented to Telemedicine Evaluation: yes RN virtual assistant: Toribio Kiss, RN Video encounter time and date: 11/17/2024 and at approximately: 11:46a  Chief Concern: Shortness of breath, cough, fever  HPI: Veronica Barrett is a 74 year old female with history of hyperlipidemia, chronic hemorrhoids, iron deficiency anemia, GERD, asthma, not on oxygen.  11/16/2024: Patient presented to the ED for chief concerns of shortness of breath, cough with green sputum, and fever over the last 3 days.  12/31: [facility].  Pending bed availability.  At the time of my evaluation, patient had temperature of 98.5, respiration rate 12, heart rate 60, blood pressure 101/51, SpO2 94% on 3 L nasal cannula.  Serum sodium is 138, potassium 3.3, chloride 101, bicarb 22, BUN of 11, serum creatinine 0.62, eGFR greater than 60, nonfasting blood glucose 129, WBC 12.5, hemoglobin 11.9, platelets of 307.  COVID/influenza A/influenza B/RSV PCR were negative.  Lactic acid was initially 2.2 and on repeat was 1.4.  UA showed trace leukocytes and negative for nitrites.  ED treatment: LR 1.5 L bolus, Solu-Medrol  to 50 mg IV one-time dose, magnesium  2 g IV one-time dose, azithromycin  500 mg IV, ceftriaxone  2 g IV one-time dose. Per EDP, patient desated to 88% on RA and was placed on 3 L [place] with appropriate improvement.  11/17/2024: I assumed care of the patient.  Virtual admission completed. ---------------------------------- At bedside, via telemedicine  encounter, patient was able to confirm her first and last name, her date of birth, current calendar year.  Patient reports that on 12/28, she developed shortness of breath, fever, cough with productive green sputum.  Tmax on that day was 104F.  Patient took acetaminophen  with temperature decreasing to 99.  Patient reports sputum is now a light reddish-brown in color.  She endorses nausea and vomiting yesterday.  She denies chest pain, abdominal pain, dysuria, hematuria, blood in her stool, swelling of her lower extremity, syncope.  She reports that she has alternation between constipation and diarrhea and this is her baseline.  She denies any changes to bowel movement habits.  She denies known sick contacts though her daughter and daughter-in-law are visiting her from out of town.  Social history: She lives at home on her own.  She denies tobacco, EtOH, recreational drug use.  She is retired and previously worked at the [employer].  ROS: Constitutional: no weight change, no fever ENT/Mouth: no sore throat, no rhinorrhea Eyes: no eye pain, no vision changes Cardiovascular: no chest pain, + dyspnea,  no edema, no palpitations Respiratory: + cough, + sputum, no wheezing Gastrointestinal: no nausea, no vomiting, no diarrhea, no constipation Genitourinary: no urinary incontinence, no dysuria, no hematuria Musculoskeletal: no arthralgias, no myalgias Skin: no skin lesions, no pruritus, Neuro: + weakness, no loss of consciousness, no syncope Psych: no anxiety, no depression, + decrease appetite Heme/Lymph: no bruising, no bleeding  Assessment/Plan  Principal Problem:   Sepsis due to pneumonia [facility]) Active Problems:   Multilobar lung infiltrate   GERD (gastroesophageal reflux disease)   Depression, recurrent   Cough   Hyperlipidemia   Moderate  persistent asthma   Hypokalemia   Hemorrhoids   Assessment and Plan:  * Sepsis due to pneumonia (HCC) Acute hypoxic respiratory failure Patient  meets severe sepsis criteria with elevated lactic acid of 2.2 initially, WBC elevation at 12.5, source of infection is multilobar pneumonia with left lower lobe being the worst Blood cultures x 2 are in process Continue with ceftriaxone  2 g IV daily, azithromycin  500 mg IV daily to complete a 5-day course Continue with LR infusion at 125 mL/h Patient is status post 1.5 mL liter bolus Maintain MAP > 65 Continue O2 supplementation to maintain spO2 > 92%  Multilobar lung infiltrate CAP Continue with azithromycin  500 mg IV daily, ceftriaxone  2 g IV daily to complete a 5-day course DuoNebs every 6 hours Albuterol  nebulizer every 4 hours as needed for wheezing and shortness of breath Flutter valve, incentive spirometry  GERD (gastroesophageal reflux disease) Home PPI equivalent  Hemorrhoids Home hydrocortisone  25 mg suppository anal itching, hemorrhoids  Hypokalemia Potassium chloride  40 mEq p.o. one-time dose Recheck BMP in the AM Check serum magnesium  level on admission  Moderate persistent asthma With acute exacerbation Home long-acting inhaler equivalent resumed DuoNebs qid, 3 doses scheduled on admission Albuterol  nebulizer every 4 hours as needed for wheezing and shortness of breath grams to at bedtime milligrams daily resumed  Hyperlipidemia Home atorvastatin  20 mg daily resumed  Cough Guaifenesin  5 ml every 6 hours as needed to loosen phlegm and cough during the day Tussionex 5 mL nightly as needed for coughing at night, 3 days ordered  Depression, recurrent Home citalopram  30 mg daily resumed  Chart reviewed.   DVT prophylaxis: Enoxaparin  Code Status: Full code Diet: Heart healthy diet Family Communication: A phone call has been offered, patient declined stating that she has updated her daughters already. Disposition Plan: Pending clinical course Consults called: None at this time Admission status: Inpatient, telemetry  Past Medical History:  Diagnosis Date    Allergy    seasonal allergy   Arthritis    left hip   Asthma    Depression    HSV infection    vag   Past Surgical History:  Procedure Laterality Date   APPENDECTOMY  11/17/1982   EYE SURGERY Left    left vitreous   LEG SURGERY Left 01/10/2024   TOTAL HIP ARTHROPLASTY Left 10/17/2014   Procedure: LEFT TOTAL HIP ARTHROPLASTY ANTERIOR APPROACH;  Surgeon: Donnice JONETTA Car, MD;  Location: [facility];  Service: Orthopedics;  Laterality: Left;   Social History:  reports that she has never smoked. She has never been exposed to tobacco smoke. She has never used smokeless tobacco. She reports that she does not currently use alcohol after a past usage of about 10.0 standard drinks of alcohol per week. She reports that she does not use drugs.  Allergies[1] Family History  Problem Relation Age of Onset   Cancer Father    Cancer Sister    Thyroid  cancer Sister    Arthritis Other    Colon cancer Neg Hx    Family history: Family history reviewed and not pertinent.  Prior to Admission medications  Medication Sig Start Date End Date Taking? Authorizing Provider  acetaminophen  (TYLENOL ) 325 MG tablet Take 650 mg by mouth every 6 (six) hours as needed. 01/25/24   [provider]  acyclovir  (ZOVIRAX ) 200 MG capsule TAKE 1 CAPSULE BY MOUTH 5 TIMES DAILY AS NEEDED FOR BREAKOUTS 04/05/24   Chandra Toribio POUR, MD  albuterol  (VENTOLIN  HFA) 108 [phone] Base) MCG/ACT inhaler Inhale  2 puffs into the lungs every 6 (six) hours as needed for wheezing or shortness of breath. 07/13/23   Hunsucker, Donnice SAUNDERS, MD  alendronate  (FOSAMAX ) 70 MG tablet Take 70 mg by mouth once a week.    [provider]  ascorbic acid (VITAMIN C) 500 MG tablet Take 500 mg by mouth daily. 01/25/24 01/24/25  [provider]  ASPIRIN  LOW DOSE 81 MG tablet SMARTSIG:1 Tablet(s) By Mouth Morning-Night 01/25/24   [provider]  atorvastatin  (LIPITOR) 20 MG tablet Take 1 tablet by mouth once daily 10/25/24   Clapp, Kara F,  PA-C  budesonide -formoterol  (SYMBICORT) 160-4.5 MCG/ACT inhaler Inhale 2 puffs into the lungs as directed.    [provider]  calcium  citrate (CALCITRATE - DOSED IN MG ELEMENTAL CALCIUM ) 950 (200 Ca) MG tablet Take 950 mg by mouth 2 (two) times daily. 01/25/24   [provider]  cetirizine (ZYRTEC) 10 MG tablet Take 10 mg by mouth daily.    [provider]  citalopram  (CELEXA ) 20 MG tablet TAKE 1 & 1/2 (ONE & ONE-HALF) TABLETS BY MOUTH ONCE DAILY 07/25/24   Gayle Numbers F, PA-C  Difluprednate 0.05 % EMUL Place 1 drop into the left eye 4 (four) times daily. 04/26/24   [provider]  famotidine (PEPCID) 20 MG tablet Take 20 mg by mouth daily. 01/25/24 01/24/25  [provider]  ferrous gluconate (FERGON) 324 MG tablet Take 324 mg by mouth daily with breakfast. 01/25/24 01/24/25  [provider]  fluticasone  (FLONASE ) 50 MCG/ACT nasal spray Place 1 spray into both nostrils daily.    [provider]  fluticasone -salmeterol (ADVAIR) 250-50 MCG/ACT AEPB Inhale 1 puff into the lungs in the morning and at bedtime.    [provider]  gatifloxacin (ZYMAXID) 0.5 % SOLN Place 1 drop into the left eye 4 (four) times daily. 04/26/24   [provider]  hydrocortisone  (ANUSOL -HC) 25 MG suppository Place 1 suppository (25 mg total) rectally 2 (two) times daily as needed for hemorrhoids or anal itching. 08/08/24   Gayle Numbers FALCON, PA-C  ipratropium-albuterol  (DUONEB) 0.5-2.5 (3) MG/3ML SOLN Take 3 mLs by nebulization every 6 (six) hours as needed (shortness of breath, cough or wheeze). 12/07/23   Hunsucker, Donnice SAUNDERS, MD  ketorolac  (ACULAR ) 0.5 % ophthalmic solution Place 1 drop into the left eye 4 (four) times daily. 04/26/24   [provider]  mometasone -formoterol  (DULERA ) 200-5 MCG/ACT AERO Inhale 2 puffs into the lungs daily. 05/30/24   Hunsucker, Donnice SAUNDERS, MD  montelukast  (SINGULAIR ) 10 MG tablet TAKE 1 TABLET BY MOUTH AT BEDTIME  07/26/24   Gayle Numbers FALCON, PA-C  Nebulizer System All-In-One MISC Use with nebulized albuterol  once every 4-6 hours for wheezing and shortness of breath. 06/09/21   Christopher Savannah, PA-C  pantoprazole  (PROTONIX ) 40 MG tablet Take 1 tablet (40 mg total) by mouth daily. 11/05/21   Hunsucker, Donnice SAUNDERS, MD  predniSONE  (DELTASONE ) 10 MG tablet Take 10 mg by mouth as directed. 09/21/23   [provider]  Respiratory Therapy Supplies (NEBULIZER MASK ADULT) MISC 1 Inhaler by Does not apply route every 4 (four) hours as needed. 06/09/21   Christopher Savannah, PA-C  senna (SENOKOT) 8.6 MG tablet Take 17.2 mg by mouth daily. 01/25/24 01/24/25  [provider]   Physical Exam completed with assistance of: Toribio Kiss, RN, who was at bedside during this portion of the virtual encounter:  Vitals:   11/17/24 0710 11/17/24 0900 11/17/24 1130 11/17/24 1333  BP:  ROLLEN)  101/51 (!) 108/52 (!) 104/50  Pulse:  63 64 63  Resp:  15 18 19   Temp: 98.5 F (36.9 C)   98.7 F (37.1 C)  TempSrc: Oral   Oral  SpO2:  94% 98% 94%  Weight:      Height:       Constitutional: appears age-appropriate, weak, calm Eyes: EOMI,  conjunctivae normal ENMT: Mucous membranes are moist. Hearing appropriate Neck: normal, supple, no masses, no thyromegaly Respiratory: + bilateral rhonchi with diminished lung sounds in the bases. Mild increase respiratory effort. No accessory muscle use.  Cardiovascular: Regular rate and rhythm, no murmurs. No extremity edema. 2+ pedal pulses. Abdomen: no tenderness. Bowel sounds positive.  Musculoskeletal: No joint deformity upper and lower extremities. Good ROM, no contractures, no atrophy. Skin: no rashes, ulcers on visible skin Neurologic: Strength is appropriate upper extremities.  Psychiatric: Normal judgment and insight. Alert and oriented x 3. Normal mood.   EKG: independently reviewed, showing sinus rhythm with rate of 71, QTc 439  Chest x-ray on Admission: I personally reviewed and I  agree with radiologist reading as below.  CT Angio Chest PE W/Cm &/Or Wo Cm Result Date: 11/16/2024 EXAM: CTA CHEST 11/16/2024 05:53:20 PM TECHNIQUE: CTA of the chest was performed without and with the administration of 75 mL of iohexol (OMNIPAQUE) 350 MG/ML injection. Multiplanar reformatted images are provided for review. MIP images are provided for review. Automated exposure control, iterative reconstruction, and/or weight based adjustment of the mA/kV was utilized to reduce the radiation dose to as low as reasonably achievable. COMPARISON: None available. CLINICAL HISTORY: Pulmonary embolism (PE) suspected, high prob. FINDINGS: PULMONARY ARTERIES: Pulmonary arteries are adequately opacified for evaluation. No acute pulmonary embolus. Main pulmonary artery is normal in caliber. MEDIASTINUM: The heart and pericardium demonstrate no acute abnormality. There is no acute abnormality of the thoracic aorta. LYMPH NODES: There is a mildly enlarged left hilar lymph node measuring 11 mm. Mildly enlarged right hilar lymph node also measures 11 mm. LUNGS AND PLEURA: There is airspace consolidation throughout the mid and lower aspect of the left lower lobe. There is additional patchy multifocal ground glass opacity in the bilateral upper lobes, right lower lobe and right middle lobe. There are bands of atelectasis in the right lower lobe. No evidence of pleural effusion or pneumothorax. UPPER ABDOMEN: Limited images of the upper abdomen are unremarkable. SOFT TISSUES AND BONES: No acute bone or soft tissue abnormality. IMPRESSION: 1. No evidence of pulmonary embolism. 2. Multifocal pneumonia with dense consolidation throughout the left lower lobe. 3. Mildly enlarged bilateral hilar lymph nodes, each measuring 11 mm, likely reactive. Electronically signed by: Greig Pique MD 11/16/2024 07:13 PM EST RP Workstation: [id number]   DG Chest 2 View Result Date: 11/16/2024 EXAM: 2 VIEW(S) XRAY OF THE CHEST 11/16/2024  12:34:15 PM COMPARISON: 08/10/2023 CLINICAL HISTORY: wheezing, decreased O2, decreased lung sounds in bases FINDINGS: LUNGS AND PLEURA: Airspace opacity at the left lung base, predominantly platelike favoring atelectasis although cannot completely exclude retrocardiac pneumonia. Right lung clear. No pleural effusion. No pneumothorax. HEART AND MEDIASTINUM: Aortic atherosclerosis. No acute abnormality of the cardiac and mediastinal silhouettes. BONES AND SOFT TISSUES: No acute osseous abnormality. IMPRESSION: 1. Airspace opacity at the left lung base, predominantly platelike, favoring atelectasis; retrocardiac pneumonia cannot be completely excluded. Electronically signed by: Franky Crease MD 11/16/2024 01:08 PM EST RP Workstation: [id number]   Labs on Admission: I have personally reviewed following labs.  CBC: Recent Labs  Lab 11/16/24 1411 11/17/24 1327  WBC 12.5*  7.6  HGB 11.9* 10.6*  HCT 35.6* 32.7*  MCV 92.2 94.0  PLT 307 252   Basic Metabolic Panel: Recent Labs  Lab 11/16/24 1411 11/17/24 1327  NA 138 137  K 3.3* 3.9  CL 101 105  CO2 22 22  GLUCOSE 129* 130*  BUN 11 9  CREATININE 0.62 0.46  CALCIUM  9.4 8.3*  MG  --  2.4   GFR: Estimated Creatinine Clearance: 64 mL/min (by C-G formula based on SCr of 0.46 mg/dL).  Liver Function Tests: Recent Labs  Lab 11/16/24 1411 11/17/24 1327  AST 18 14*  ALT 15 13  ALKPHOS 97 86  BILITOT 0.6 0.4  PROT 6.8 6.1*  ALBUMIN 3.9 3.5   Coagulation Profile: Recent Labs  Lab 11/16/24 1621  INR 1.0   Urine analysis:    Component Value Date/Time   COLORURINE STRAW (A) 11/16/2024 1720   APPEARANCEUR CLEAR 11/16/2024 1720   LABSPEC 1.010 11/16/2024 1720   PHURINE 6.0 11/16/2024 1720   GLUCOSEU NEGATIVE 11/16/2024 1720   HGBUR TRACE (A) 11/16/2024 1720   BILIRUBINUR NEGATIVE 11/16/2024 1720   BILIRUBINUR n 08/24/2018 1510   KETONESUR NEGATIVE 11/16/2024 1720   PROTEINUR TRACE (A) 11/16/2024 1720   UROBILINOGEN 0.2 08/24/2018  1510   UROBILINOGEN 0.2 10/10/2014 1100   NITRITE NEGATIVE 11/16/2024 1720   LEUKOCYTESUR TRACE (A) 11/16/2024 1720   This document was prepared using Dragon Voice Recognition software and may include unintentional dictation errors.  Dr. Sherre [facility] Location:   If 7PM-7AM, please contact overnight-coverage provider If 7AM-7PM, please contact day attending provider [email or web address]  11/17/2024, 3:03 PM      [1]  Allergies Allergen Reactions   Povidone Iodine Dermatitis    Significant rash   Seasonal Ic [Cholestatin]    Tape Rash   Wound Dressing Adhesive Dermatitis   "

## 2024-11-17 NOTE — Assessment & Plan Note (Addendum)
 Continue Guaifenesin  5 ml every 6 hours as needed to loosen phlegm and cough during the day. Tussionex 5 mL nightly as needed for coughing at night

## 2024-11-17 NOTE — Assessment & Plan Note (Addendum)
 Continue with pepcid  and protonix ., pt has severe reflux.

## 2024-11-17 NOTE — Assessment & Plan Note (Addendum)
 Continue atorvastatin  20 mg daily

## 2024-11-17 NOTE — Assessment & Plan Note (Addendum)
 11-17-2024 With acute exacerbation Home long-acting inhaler equivalent resumed DuoNebs qid, 3 doses scheduled on admission Albuterol  nebulizer every 4 hours as needed for wheezing and shortness of breath grams to at bedtime milligrams daily resumed 11/19/2024 Start scheduled nebs at 9 am and 4 pm. Continue with prn nebs.

## 2024-11-17 NOTE — Assessment & Plan Note (Addendum)
 11-17-2024 Acute hypoxic respiratory failure Patient meets severe sepsis criteria with elevated lactic acid of 2.2 initially, WBC elevation at 12.5, source of infection is multilobar pneumonia with left lower lobe being the worst Blood cultures x 2 are in process Continue with ceftriaxone  2 g IV daily, azithromycin  500 mg IV daily to complete a 5-day course Continue with LR infusion at 125 mL/h Patient is status post 1.5 mL liter bolus Maintain MAP > 65 Continue O2 supplementation to maintain spO2 > 92%

## 2024-11-17 NOTE — Progress Notes (Signed)
 Patient virtually admitted earlier today, arrived to the hospital floor, seen at bedside, she has been having respiratory symptoms for several days and increased wheezing.  She was admitted with multifocal pneumonia, has underlying asthma and started on steroids as well for asthma exacerbation along with antibiotics.  She is nontoxic-appearing, most recent set of vitals stable.  She is overall feeling better right now.  Of note, pharmacy tech is in the room and realizes that some of her medications are wrong, pharmacy will fix it after med rec is done  Jovee Dettinger M. Trixie, MD, PhD [facility]  Between 7 am - 7 pm you can contact me via Amion (for emergencies) or Securechat (non urgent matters).  I am not available 7 pm - 7 am, please contact night coverage MD/APP via Amion

## 2024-11-17 NOTE — Assessment & Plan Note (Addendum)
 11/17/2024 Potassium chloride  40 mEq p.o. one-time dose Recheck BMP in the AM Check serum magnesium  level on admission 1/4: resolved; s potassium is 4.1

## 2024-11-17 NOTE — Hospital Course (Addendum)
 Veronica Barrett is a 74 year old female with history of hyperlipidemia, chronic hemorrhoids, iron  deficiency anemia, GERD, asthma, not on oxygen.  11/16/2024: Patient presented to the ED for chief concerns of shortness of breath, cough with green sputum, and fever over the last 3 days.  12/31: Patient admitted to [facility].  Pending bed availability.  At the time of my evaluation, patient had temperature of 98.5, respiration rate 12, heart rate 60, blood pressure 101/51, SpO2 94% on 3 L nasal cannula.  Serum sodium is 138, potassium 3.3, chloride 101, bicarb 22, BUN of 11, serum creatinine 0.62, eGFR greater than 60, nonfasting blood glucose 129, WBC 12.5, hemoglobin 11.9, platelets of 307.  COVID/influenza A/influenza B/RSV PCR were negative.  Lactic acid was initially 2.2 and on repeat was 1.4.  UA showed trace leukocytes and negative for nitrites.  ED treatment: LR 1.5 L bolus, Solu-Medrol  to 50 mg IV one-time dose, magnesium  2 g IV one-time dose, azithromycin  500 mg IV, ceftriaxone  2 g IV one-time dose. Per EDP, patient desated to 88% on RA and was placed on 3 L [place] with appropriate improvement.  11/17/2024: I assumed care of the patient.  Virtual admission completed.  Admission Labs: Covid/flu/rsv negative Na 138, K 3.3, CO2 of 22, BUN 11, scr 0.62, glu 129 WBC 12.5, HgB 11.9, plt 307 T. Prot 6.8, AST 18, ALT 15, alk phos 98, t. Bili 0.6 Lactic acid 2.2 UA negative nitrite, trace LE  Admission Imaging Studies: CXR Airspace opacity at the left lung base, predominantly platelike, favoring atelectasis; retrocardiac pneumonia cannot be completely excluded. CTPA  No evidence of pulmonary embolism. 2. Multifocal pneumonia with dense consolidation throughout the left lower lobe. 3. Mildly enlarged bilateral hilar lymph nodes, each measuring 11 mm, likely reactive.  Significant Labs: RVP Positive for Metapneumovirus  Antibiotic Therapy: Anti-infectives (From admission,  onward)    Start     Dose/Rate Route Frequency Ordered Stop   11/17/24 1215  cefTRIAXone  (ROCEPHIN ) 2 g in sodium chloride  0.9 % 100 mL IVPB        2 g 200 mL/hr over 30 Minutes Intravenous Every 24 hours 11/17/24 1204 11/21/24 1214   11/17/24 1215  azithromycin  (ZITHROMAX ) 500 mg in sodium chloride  0.9 % 250 mL IVPB        500 mg 250 mL/hr over 60 Minutes Intravenous Every 24 hours 11/17/24 1204 11/21/24 1214   11/16/24 1745  cefTRIAXone  (ROCEPHIN ) 2 g in sodium chloride  0.9 % 100 mL IVPB        2 g 200 mL/hr over 30 Minutes Intravenous Once 11/16/24 1740 11/16/24 2001   11/16/24 1745  azithromycin  (ZITHROMAX ) 500 mg in sodium chloride  0.9 % 250 mL IVPB        500 mg 250 mL/hr over 60 Minutes Intravenous  Once 11/16/24 1740 11/16/24 1907     Significant Events: Admitted 11/16/2024 for sepsis due to pneumonia 11-18-2023 IV abx, IV steroids continued 11-19-2023 pt still requiring supplemental O2. RVP positive metapneumo virus.  Remains on O2 2-3 L/min. Still wheezing. 1/3: [facility] to [facility]. 1/4: I assumed care of pt. Day 5 of abx. O2 requirement changed from 3 L to 2 L. Prednisone  20 mg starting on 1/5, 2 doses ordered 1/5: day 6 of abx. Patient on 2 L [place] and spO2 in the high 90s. We will do O2 challenge with ambulation and RT oxygen evaluation and order DME oxygen as needed. TOC consulted for DME. Update (18:28): DME for nebulizer placed.  DME for oxygen placed as patient desatted to 89% with activity. 1/6: Patient completed day 7 of abx.  Patient discharged from hospital at home service.

## 2024-11-17 NOTE — ED Notes (Signed)
 Called Monisha at [facility] for transport 12:08-TC

## 2024-11-18 DIAGNOSIS — K219 Gastro-esophageal reflux disease without esophagitis: Secondary | ICD-10-CM

## 2024-11-18 DIAGNOSIS — R918 Other nonspecific abnormal finding of lung field: Secondary | ICD-10-CM

## 2024-11-18 DIAGNOSIS — A419 Sepsis, unspecified organism: Principal | ICD-10-CM

## 2024-11-18 DIAGNOSIS — J189 Pneumonia, unspecified organism: Secondary | ICD-10-CM

## 2024-11-18 DIAGNOSIS — F339 Major depressive disorder, recurrent, unspecified: Secondary | ICD-10-CM

## 2024-11-18 LAB — RESPIRATORY PANEL BY PCR

## 2024-11-18 LAB — BASIC METABOLIC PANEL WITH GFR
Anion gap: 7 (ref 5–15)
BUN: 12 mg/dL (ref 8–23)
CO2: 24 mmol/L (ref 22–32)
Calcium: 8.7 mg/dL — ABNORMAL LOW (ref 8.9–10.3)
Chloride: 108 mmol/L (ref 98–111)
Creatinine, Ser: 0.52 mg/dL (ref 0.44–1.00)
GFR, Estimated: >60 mL/min
Glucose, Bld: 144 mg/dL — ABNORMAL HIGH (ref 70–99)
Potassium: 4.7 mmol/L (ref 3.5–5.1)
Sodium: 139 mmol/L (ref 135–145)

## 2024-11-18 LAB — CBC
HCT: 29.3 % — ABNORMAL LOW (ref 36.0–46.0)
Hemoglobin: 9.8 g/dL — ABNORMAL LOW (ref 12.0–15.0)
MCH: 31.4 pg (ref 26.0–34.0)
MCHC: 33.4 g/dL (ref 30.0–36.0)
MCV: 93.9 fL (ref 80.0–100.0)
Platelets: 254 K/uL (ref 150–400)
RBC: 3.12 MIL/uL — ABNORMAL LOW (ref 3.87–5.11)
RDW: 14.1 % (ref 11.5–15.5)
WBC: 9.7 K/uL (ref 4.0–10.5)
nRBC: 0 % (ref 0.0–0.2)

## 2024-11-18 MED ORDER — ARFORMOTEROL TARTRATE 15 MCG/2ML IN NEBU
15.0000 ug | INHALATION_SOLUTION | Freq: Two times a day (BID) | RESPIRATORY_TRACT | Status: DC
  Administered 2024-11-18 – 2024-11-22 (×7): 15 ug via RESPIRATORY_TRACT
  Filled 2024-11-18 (×11): qty 2

## 2024-11-18 MED ORDER — TRAZODONE HCL 50 MG PO TABS
50.0000 mg | ORAL_TABLET | Freq: Every evening | ORAL | Status: DC | PRN
  Administered 2024-11-18: 50 mg via ORAL
  Filled 2024-11-18 (×2): qty 1

## 2024-11-18 MED ORDER — FLUTICASONE PROPIONATE 50 MCG/ACT NA SUSP
2.0000 | Freq: Every day | NASAL | Status: DC
  Administered 2024-11-18 – 2024-11-22 (×4): 2 via NASAL
  Filled 2024-11-18 (×2): qty 16

## 2024-11-18 MED ORDER — BUDESONIDE 0.25 MG/2ML IN SUSP
0.2500 mg | Freq: Two times a day (BID) | RESPIRATORY_TRACT | Status: DC
  Administered 2024-11-18 – 2024-11-21 (×4): 0.25 mg via RESPIRATORY_TRACT
  Filled 2024-11-18 (×10): qty 2

## 2024-11-18 MED ORDER — IPRATROPIUM-ALBUTEROL 0.5-2.5 (3) MG/3ML IN SOLN
3.0000 mL | Freq: Three times a day (TID) | RESPIRATORY_TRACT | Status: DC
  Administered 2024-11-18 – 2024-11-19 (×3): 3 mL via RESPIRATORY_TRACT
  Filled 2024-11-18 (×4): qty 3

## 2024-11-18 NOTE — Plan of Care (Signed)

## 2024-11-18 NOTE — Evaluation (Signed)
 Physical Therapy Evaluation Patient Details Name: Veronica Barrett MRN: [id number] DOB: 18-Mar-1951 Today's Date: 11/18/2024  History of Present Illness  74 y.o. female presents to [facility] hospital on 11/16/2024 with fever, cough and SOB. Imaging concerning for PNA. PMH includes asthma, HLD, GERD.  Clinical Impression  Pt presents to PT with deficits in activity tolerance, cardiopulmonary function, power, strength. Pt is able to ambulate fro household distances but continues to require supplemental oxygen to maintain SpO2 at or above 92%. Pt is encouraged to mobilize frequently with staff assistance to improve endurance and to maintain strength. PT provides acapella device to aide in pulmonary secretion clearance. PT anticipates no post-acute PT needs at the time of discharge.      If plan is discharge home, recommend the following: Assistance with cooking/housework;Assist for transportation;Help with stairs or ramp for entrance   Can travel by private vehicle        Equipment Recommendations None recommended by PT  Recommendations for Other Services       Functional Status Assessment Patient has had a recent decline in their functional status and demonstrates the ability to make significant improvements in function in a reasonable and predictable amount of time.     Precautions / Restrictions Precautions Precautions: Fall Recall of Precautions/Restrictions: Intact Precaution/Restrictions Comments: monitor SpO2 Restrictions Weight Bearing Restrictions Per Provider Order: No      Mobility  Bed Mobility Overal bed mobility: Modified Independent                  Transfers Overall transfer level: Modified independent Equipment used: None                    Ambulation/Gait Ambulation/Gait assistance: Supervision Gait Distance (Feet): 150 Feet Assistive device: None Gait Pattern/deviations: Step-through pattern Gait velocity: reduced Gait velocity interpretation: <1.8  ft/sec, indicate of risk for recurrent falls   General Gait Details: slowed step-through gait, reduced stance time on LLE  Stairs            Wheelchair Mobility     Tilt Bed    Modified Rankin (Stroke Patients Only)       Balance Overall balance assessment: Needs assistance Sitting-balance support: No upper extremity supported, Feet supported Sitting balance-Leahy Scale: Good     Standing balance support: No upper extremity supported, During functional activity Standing balance-Leahy Scale: Good                               Pertinent Vitals/Pain Pain Assessment Pain Assessment: Faces Faces Pain Scale: Hurts little more Pain Location: back Pain Descriptors / Indicators: Aching Pain Intervention(s): Monitored during session    Home Living Family/patient expects to be discharged to:: Private residence Living Arrangements: Alone Available Help at Discharge: Family;Available 24 hours/day (daughter is visitjng until 11/27/2024) Type of Home: House Home Access: Stairs to enter Entrance Stairs-Rails: Can reach both Entrance Stairs-Number of Steps: 5 Alternate Level Stairs-Number of Steps: flight Home Layout: Two level;Able to live on main level with bedroom/bathroom Home Equipment: Cane - single point;Rolling Walker (2 wheels);Wheelchair - manual;Shower seat      Prior Function Prior Level of Function : Independent/Modified Independent             Mobility Comments: ambulatory with PRN use of SPC when fatigued, primarily when in the community       Extremity/Trunk Assessment   Upper Extremity Assessment Upper Extremity Assessment: Overall WFL for tasks assessed  Lower Extremity Assessment Lower Extremity Assessment: Generalized weakness    Cervical / Trunk Assessment Cervical / Trunk Assessment: Normal  Communication   Communication Communication: Impaired Factors Affecting Communication: Hearing impaired (wears hearing aides)     Cognition Arousal: Alert Behavior During Therapy: WFL for tasks assessed/performed   PT - Cognitive impairments: No apparent impairments                         Following commands: Intact       Cueing Cueing Techniques: Verbal cues     General Comments General comments (skin integrity, edema, etc.): pt on 3L [place] upon PT arrival, weaned to room air and pt desats to 87% with activity. On 2L [place] pt maintains sats from 92-93% when ambulating    Exercises     Assessment/Plan    PT Assessment Patient needs continued PT services  PT Problem List Decreased strength;Decreased activity tolerance;Decreased balance;Decreased mobility;Decreased knowledge of use of DME;Cardiopulmonary status limiting activity       PT Treatment Interventions DME instruction;Gait training;Stair training;Functional mobility training;Therapeutic activities;Therapeutic exercise;Balance training;Neuromuscular re-education;Patient/family education    PT Goals (Current goals can be found in the Care Plan section)  Acute Rehab PT Goals Patient Stated Goal: to return to prior level of function, improve activity tolerance PT Goal Formulation: With patient Time For Goal Achievement: 12/02/24 Potential to Achieve Goals: Good Additional Goals Additional Goal #1: Pt will score >19/24 on the DGI to indicate a reduced risk for falls Additional Goal #2: Pt will report 1/4 DOE or less when ambulating for >250' to demonstrate improved activity tolerance    Frequency Min 2X/week     Co-evaluation               AM-PAC PT 6 Clicks Mobility  Outcome Measure Help needed turning from your back to your side while in a flat bed without using bedrails?: None Help needed moving from lying on your back to sitting on the side of a flat bed without using bedrails?: None Help needed moving to and from a bed to a chair (including a wheelchair)?: None Help needed standing up from a chair using your arms (e.g.,  wheelchair or bedside chair)?: None Help needed to walk in hospital room?: A Little Help needed climbing 3-5 steps with a railing? : A Little 6 Click Score: 22    End of Session Equipment Utilized During Treatment: Oxygen Activity Tolerance: Patient tolerated treatment well Patient left: in bed;with call bell/phone within reach Nurse Communication: Mobility status PT Visit Diagnosis: Other abnormalities of gait and mobility (R26.89);Muscle weakness (generalized) (M62.81)    Time: 8365-8341 PT Time Calculation (min) (ACUTE ONLY): 24 min   Charges:   PT Evaluation $PT Eval Low Complexity: 1 Low   PT General Charges $$ ACUTE PT VISIT: 1 Visit         Bernardino JINNY Ruth, PT, DPT Acute Rehabilitation Office [phone]   Bernardino JINNY Ruth 11/18/2024, 5:09 PM

## 2024-11-18 NOTE — Progress Notes (Signed)
 "                        PROGRESS NOTE        PATIENT DETAILS Name: Veronica Barrett Age: 74 y.o. Sex: female Date of Birth: 12-21-1950 Admit Date: 11/16/2024 Admitting Physician Micaela Speaker, MD ERE:Rojee, [name], PA-C  Brief Summary: Patient is a 74 y.o.  female with history of asthma, HLD, GERD-who presented with fever/productive cough/shortness of breath-found to have PNA and asthma exacerbation.  Significant events: 1/1>> admit to [facility]  Significant studies: 12/31>> CT chest: No PE, multifocal PNA  Significant microbiology data: 12/31>> COVID/influenza/RSV PCR: Negative 12/31>> blood culture: No growth  Procedures: None  Consults: None  Subjective: Feels overall better but still tight still coughing and wheezing.  Objective: Vitals: Blood pressure 102/64, pulse 64, temperature 97.7 F (36.5 C), temperature source Oral, resp. rate 17, height 5' 5 (1.651 m), weight 73.9 kg, SpO2 92%.   Exam: Gen Exam:Alert awake-not in any distress HEENT:atraumatic, normocephalic Chest: Moving air well-rhonchi all over CVS:S1S2 regular Abdomen:soft non tender, non distended Extremities:no edema Neurology: Non focal Skin: no rash  Pertinent Labs/Radiology:    Latest Ref Rng & Units 11/18/2024    4:21 AM 11/17/2024    1:27 PM 11/16/2024    2:11 PM  CBC  WBC 4.0 - 10.5 K/uL 9.7  7.6  12.5   Hemoglobin 12.0 - 15.0 g/dL 9.8  89.3  88.0   Hematocrit 36.0 - 46.0 % 29.3  32.7  35.6   Platelets 150 - 400 K/uL 254  252  307     Lab Results  Component Value Date   NA 139 11/18/2024   K 4.7 11/18/2024   CL 108 11/18/2024   CO2 24 11/18/2024      Assessment/Plan: Sepsis secondary to multifocal pneumonia Sepsis physiology has resolved Culture data as above Continue empiric antibiotics and follow clinical course  Asthma exacerbation Likely provoked by PNA Although better still wheezing quite a bit-not yet back to baseline Continue steroids/scheduled  bronchodilators Incentive spirometry/flutter valve  Mood disorder Stable Celexa   HLD Statin  GERD PPI  History of hemorrhoids Anusol  suppository  Code status:   Code Status: Full Code   DVT Prophylaxis: enoxaparin  (LOVENOX ) injection 40 mg Start: 11/17/24 1215 Place TED hose Start: 11/17/24 1204 Place and maintain sequential compression device Start: 11/16/24 2230   Family Communication: None at bedside   Disposition Plan: Status is: Inpatient Remains inpatient appropriate because: Severity of illness   Planned Discharge Destination:Home   Diet: Diet Order             Diet Heart Room service appropriate? Yes; Fluid consistency: Thin  Diet effective now                     Antimicrobial agents: Anti-infectives (From admission, onward)    Start     Dose/Rate Route Frequency Ordered Stop   11/17/24 1215  cefTRIAXone  (ROCEPHIN ) 2 g in sodium chloride  0.9 % 100 mL IVPB        2 g 200 mL/hr over 30 Minutes Intravenous Every 24 hours 11/17/24 1204 11/21/24 1214   11/17/24 1215  azithromycin  (ZITHROMAX ) 500 mg in sodium chloride  0.9 % 250 mL IVPB        500 mg 250 mL/hr over 60 Minutes Intravenous Every 24 hours 11/17/24 1204 11/21/24 1214   11/16/24 1745  cefTRIAXone  (ROCEPHIN ) 2 g in sodium chloride  0.9 % 100 mL IVPB  2 g 200 mL/hr over 30 Minutes Intravenous Once 11/16/24 1740 11/16/24 2001   11/16/24 1745  azithromycin  (ZITHROMAX ) 500 mg in sodium chloride  0.9 % 250 mL IVPB        500 mg 250 mL/hr over 60 Minutes Intravenous  Once 11/16/24 1740 11/16/24 1907        MEDICATIONS: Scheduled Meds:  ascorbic acid  250 mg Oral Daily   atorvastatin   20 mg Oral Daily   calcium -vitamin D   1 tablet Oral Daily   citalopram   30 mg Oral Daily   enoxaparin  (LOVENOX ) injection  40 mg Subcutaneous Q24H   famotidine  20 mg Oral Daily   ferrous gluconate  324 mg Oral Q breakfast   fluticasone  furoate-vilanterol  1 puff Inhalation Daily   loratadine   10  mg Oral Daily   methylPREDNISolone  (SOLU-MEDROL ) injection  40 mg Intravenous Daily   montelukast   10 mg Oral QHS   multivitamin with minerals  1 tablet Oral Daily   pantoprazole   40 mg Oral Daily   Continuous Infusions:  azithromycin  500 mg (11/17/24 1415)   cefTRIAXone  (ROCEPHIN )  IV 2 g (11/17/24 1340)   PRN Meds:.acetaminophen  **OR** acetaminophen , albuterol , chlorpheniramine-HYDROcodone , guaiFENesin , hydrocortisone , ondansetron  **OR** ondansetron  (ZOFRAN ) IV, senna-docusate   I have personally reviewed following labs and imaging studies  LABORATORY DATA: CBC: Recent Labs  Lab 11/16/24 1411 11/17/24 1327 11/18/24 0421  WBC 12.5* 7.6 9.7  HGB 11.9* 10.6* 9.8*  HCT 35.6* 32.7* 29.3*  MCV 92.2 94.0 93.9  PLT 307 252 254    Basic Metabolic Panel: Recent Labs  Lab 11/16/24 1411 11/17/24 1327 11/18/24 0421  NA 138 137 139  K 3.3* 3.9 4.7  CL 101 105 108  CO2 22 22 24   GLUCOSE 129* 130* 144*  BUN 11 9 12   CREATININE 0.62 0.46 0.52  CALCIUM  9.4 8.3* 8.7*  MG  --  2.4  --     GFR: Estimated Creatinine Clearance: 64 mL/min (by C-G formula based on SCr of 0.52 mg/dL).  Liver Function Tests: Recent Labs  Lab 11/16/24 1411 11/17/24 1327  AST 18 14*  ALT 15 13  ALKPHOS 97 86  BILITOT 0.6 0.4  PROT 6.8 6.1*  ALBUMIN 3.9 3.5   No results for input(s): LIPASE, AMYLASE in the last 168 hours. No results for input(s): AMMONIA in the last 168 hours.  Coagulation Profile: Recent Labs  Lab 11/16/24 1621  INR 1.0    Cardiac Enzymes: No results for input(s): CKTOTAL, CKMB, CKMBINDEX, TROPONINI in the last 168 hours.  BNP (last 3 results) No results for input(s): PROBNP in the last 8760 hours.  Lipid Profile: No results for input(s): CHOL, HDL, LDLCALC, TRIG, CHOLHDL, LDLDIRECT in the last 72 hours.  Thyroid  Function Tests: No results for input(s): TSH, T4TOTAL, FREET4, T3FREE, THYROIDAB in the last 72 hours.  Anemia  Panel: No results for input(s): VITAMINB12, FOLATE, FERRITIN, TIBC, IRON, RETICCTPCT in the last 72 hours.  Urine analysis:    Component Value Date/Time   COLORURINE STRAW (A) 11/16/2024 1720   APPEARANCEUR CLEAR 11/16/2024 1720   LABSPEC 1.010 11/16/2024 1720   PHURINE 6.0 11/16/2024 1720   GLUCOSEU NEGATIVE 11/16/2024 1720   HGBUR TRACE (A) 11/16/2024 1720   BILIRUBINUR NEGATIVE 11/16/2024 1720   BILIRUBINUR n 08/24/2018 1510   KETONESUR NEGATIVE 11/16/2024 1720   PROTEINUR TRACE (A) 11/16/2024 1720   UROBILINOGEN 0.2 08/24/2018 1510   UROBILINOGEN 0.2 10/10/2014 1100   NITRITE NEGATIVE 11/16/2024 1720   LEUKOCYTESUR TRACE (A) 11/16/2024  1720    Sepsis Labs: Lactic Acid, Venous    Component Value Date/Time   LATICACIDVEN 1.4 11/16/2024 1821    MICROBIOLOGY: Recent Results (from the past 240 hours)  Blood Culture (routine x 2)     Status: None (Preliminary result)   Collection Time: 11/16/24  4:21 PM   Specimen: BLOOD  Result Value Ref Range Status   Specimen Description   Final    BLOOD LEFT ANTECUBITAL Performed at [facility], [address] [place], [place] [address]    Special Requests   Final    BOTTLES DRAWN AEROBIC AND ANAEROBIC Blood Culture adequate volume Performed at [facility], [address], [place], [place] [address]    Culture   Final    NO GROWTH 2 DAYS Performed at [facility], [address] [place], [place] [address]    Report Status PENDING  Incomplete  Blood Culture (routine x 2)     Status: None (Preliminary result)   Collection Time: 11/16/24  4:26 PM   Specimen: BLOOD  Result Value Ref Range Status   Specimen Description   Final    BLOOD RIGHT ANTECUBITAL Performed at [facility], [address] [place], [place] [address]    Special Requests   Final    BOTTLES DRAWN AEROBIC AND ANAEROBIC Blood Culture adequate volume Performed at [facility], [address], [place], [place] [address]    Culture   Final    NO GROWTH 2 DAYS Performed at [facility], [address], [place], [place] [address]    Report Status PENDING  Incomplete  Resp panel by RT-PCR (RSV, Flu A&B, Covid) Anterior Nasal Swab     Status: None   Collection Time: 11/16/24  4:36 PM   Specimen: Anterior Nasal Swab  Result Value Ref Range Status   SARS Coronavirus 2 by RT PCR NEGATIVE NEGATIVE Final    Comment: (NOTE) SARS-CoV-2 target nucleic acids are NOT DETECTED.  The SARS-CoV-2 RNA is generally detectable in upper respiratory specimens during the acute phase of infection. The lowest concentration of SARS-CoV-2 viral copies this assay can detect is 138 copies/mL. A negative result does not preclude SARS-Cov-2 infection and should not be used as the sole basis for treatment or other patient management decisions. A negative result may occur with  improper specimen collection/handling, submission of specimen other than nasopharyngeal swab, presence of viral mutation(s) within the areas targeted by this assay, and inadequate number of viral copies(<138 copies/mL). A negative result must be combined with clinical observations, patient history, and epidemiological information. The expected result is Negative.  Fact Sheet for Patients:  bloggercourse.com  Fact Sheet for Healthcare Providers:  seriousbroker.it  This test is no t yet approved or cleared by the [place]  FDA and  has been authorized for detection and/or diagnosis of SARS-CoV-2 by FDA under an Emergency Use Authorization (EUA). This EUA will remain  in effect (meaning this test can be used) for the duration of the COVID-19 declaration under Section 564(b)(1) of the Act, 21 U.S.C.section 360bbb-3(b)(1), unless the authorization is terminated  or revoked sooner.       Influenza A by PCR NEGATIVE NEGATIVE Final    Influenza B by PCR NEGATIVE NEGATIVE Final    Comment: (NOTE) The Xpert Xpress SARS-CoV-2/FLU/RSV plus assay is intended as an aid in the diagnosis of influenza from Nasopharyngeal swab specimens and should not be used as a sole basis for treatment. Nasal washings and  aspirates are unacceptable for Xpert Xpress SARS-CoV-2/FLU/RSV testing.  Fact Sheet for Patients: bloggercourse.com  Fact Sheet for Healthcare Providers: seriousbroker.it  This test is not yet approved or cleared by the [place]  FDA and has been authorized for detection and/or diagnosis of SARS-CoV-2 by FDA under an Emergency Use Authorization (EUA). This EUA will remain in effect (meaning this test can be used) for the duration of the COVID-19 declaration under Section 564(b)(1) of the Act, 21 U.S.C. section 360bbb-3(b)(1), unless the authorization is terminated or revoked.     Resp Syncytial Virus by PCR NEGATIVE NEGATIVE Final    Comment: (NOTE) Fact Sheet for Patients: bloggercourse.com  Fact Sheet for Healthcare Providers: seriousbroker.it  This test is not yet approved or cleared by the [place]  FDA and has been authorized for detection and/or diagnosis of SARS-CoV-2 by FDA under an Emergency Use Authorization (EUA). This EUA will remain in effect (meaning this test can be used) for the duration of the COVID-19 declaration under Section 564(b)(1) of the Act, 21 U.S.C. section 360bbb-3(b)(1), unless the authorization is terminated or revoked.  Performed at [facility], [address] [place], [place] [address]     RADIOLOGY STUDIES/RESULTS: CT Angio Chest PE W/Cm &/Or Wo Cm Result Date: 11/16/2024 EXAM: CTA CHEST 11/16/2024 05:53:20 PM TECHNIQUE: CTA of the chest was performed without and with the administration of 75 mL of iohexol (OMNIPAQUE) 350 MG/ML injection.  Multiplanar reformatted images are provided for review. MIP images are provided for review. Automated exposure control, iterative reconstruction, and/or weight based adjustment of the mA/kV was utilized to reduce the radiation dose to as low as reasonably achievable. COMPARISON: None available. CLINICAL HISTORY: Pulmonary embolism (PE) suspected, high prob. FINDINGS: PULMONARY ARTERIES: Pulmonary arteries are adequately opacified for evaluation. No acute pulmonary embolus. Main pulmonary artery is normal in caliber. MEDIASTINUM: The heart and pericardium demonstrate no acute abnormality. There is no acute abnormality of the thoracic aorta. LYMPH NODES: There is a mildly enlarged left hilar lymph node measuring 11 mm. Mildly enlarged right hilar lymph node also measures 11 mm. LUNGS AND PLEURA: There is airspace consolidation throughout the mid and lower aspect of the left lower lobe. There is additional patchy multifocal ground glass opacity in the bilateral upper lobes, right lower lobe and right middle lobe. There are bands of atelectasis in the right lower lobe. No evidence of pleural effusion or pneumothorax. UPPER ABDOMEN: Limited images of the upper abdomen are unremarkable. SOFT TISSUES AND BONES: No acute bone or soft tissue abnormality. IMPRESSION: 1. No evidence of pulmonary embolism. 2. Multifocal pneumonia with dense consolidation throughout the left lower lobe. 3. Mildly enlarged bilateral hilar lymph nodes, each measuring 11 mm, likely reactive. Electronically signed by: Greig Pique MD 11/16/2024 07:13 PM EST RP Workstation: [id number]   DG Chest 2 View Result Date: 11/16/2024 EXAM: 2 VIEW(S) XRAY OF THE CHEST 11/16/2024 12:34:15 PM COMPARISON: 08/10/2023 CLINICAL HISTORY: wheezing, decreased O2, decreased lung sounds in bases FINDINGS: LUNGS AND PLEURA: Airspace opacity at the left lung base, predominantly platelike favoring atelectasis although cannot completely exclude retrocardiac pneumonia.  Right lung clear. No pleural effusion. No pneumothorax. HEART AND MEDIASTINUM: Aortic atherosclerosis. No acute abnormality of the cardiac and mediastinal silhouettes. BONES AND SOFT TISSUES: No acute osseous abnormality. IMPRESSION: 1. Airspace opacity at the left lung base, predominantly platelike, favoring atelectasis; retrocardiac pneumonia cannot be completely excluded. Electronically signed by: Franky Crease MD 11/16/2024 01:08 PM EST RP Workstation: [id number]     LOS: 1 day  Donalda Applebaum, MD  [facility]    To contact the attending provider between 7A-7P or the covering provider during after hours 7P-7A, please log into the web site [email or web address] and access using universal [place] password for that web site. If you do not have the password, please call the hospital operator.  11/18/2024, 11:03 AM    "

## 2024-11-19 DIAGNOSIS — J188 Other pneumonia, unspecified organism: Secondary | ICD-10-CM

## 2024-11-19 DIAGNOSIS — J9601 Acute respiratory failure with hypoxia: Secondary | ICD-10-CM

## 2024-11-19 DIAGNOSIS — D649 Anemia, unspecified: Secondary | ICD-10-CM

## 2024-11-19 DIAGNOSIS — J4541 Moderate persistent asthma with (acute) exacerbation: Secondary | ICD-10-CM

## 2024-11-19 LAB — BASIC METABOLIC PANEL WITH GFR
Anion gap: 10 (ref 5–15)
BUN: 13 mg/dL (ref 8–23)
CO2: 24 mmol/L (ref 22–32)
Calcium: 8.6 mg/dL — ABNORMAL LOW (ref 8.9–10.3)
Chloride: 107 mmol/L (ref 98–111)
Creatinine, Ser: 0.6 mg/dL (ref 0.44–1.00)
GFR, Estimated: >60 mL/min
Glucose, Bld: 81 mg/dL (ref 70–99)
Potassium: 3.3 mmol/L — ABNORMAL LOW (ref 3.5–5.1)
Sodium: 141 mmol/L (ref 135–145)

## 2024-11-19 LAB — CBC
HCT: 32.5 % — ABNORMAL LOW (ref 36.0–46.0)
Hemoglobin: 10.5 g/dL — ABNORMAL LOW (ref 12.0–15.0)
MCH: 30.5 pg (ref 26.0–34.0)
MCHC: 32.3 g/dL (ref 30.0–36.0)
MCV: 94.5 fL (ref 80.0–100.0)
Platelets: 303 K/uL (ref 150–400)
RBC: 3.44 MIL/uL — ABNORMAL LOW (ref 3.87–5.11)
RDW: 14.3 % (ref 11.5–15.5)
WBC: 11.1 K/uL — ABNORMAL HIGH (ref 4.0–10.5)
nRBC: 0 % (ref 0.0–0.2)

## 2024-11-19 MED ORDER — AZITHROMYCIN 500 MG PO TABS
500.0000 mg | ORAL_TABLET | Freq: Every day | ORAL | Status: DC
  Filled 2024-11-19 (×2): qty 1

## 2024-11-19 MED ORDER — IPRATROPIUM-ALBUTEROL 0.5-2.5 (3) MG/3ML IN SOLN
3.0000 mL | Freq: Two times a day (BID) | RESPIRATORY_TRACT | Status: DC
  Filled 2024-11-19 (×2): qty 3

## 2024-11-19 MED ORDER — ACETAMINOPHEN 500 MG PO TABS
1000.0000 mg | ORAL_TABLET | Freq: Four times a day (QID) | ORAL | Status: DC | PRN
  Administered 2024-11-19 – 2024-11-20 (×2): 500 mg via ORAL
  Administered 2024-11-21: 1000 mg via ORAL
  Filled 2024-11-19 (×8): qty 2

## 2024-11-19 MED ORDER — ONDANSETRON HCL 4 MG PO TABS
4.0000 mg | ORAL_TABLET | Freq: Four times a day (QID) | ORAL | Status: DC | PRN
  Filled 2024-11-19: qty 1

## 2024-11-19 MED ORDER — HYDROCOD POLI-CHLORPHE POLI ER 10-8 MG/5ML PO SUER
5.0000 mL | Freq: Two times a day (BID) | ORAL | Status: DC | PRN
  Filled 2024-11-19 (×4): qty 5

## 2024-11-19 MED ORDER — HYDROCOD POLI-CHLORPHE POLI ER 10-8 MG/5ML PO SUER: 5.0000 mL | Freq: Two times a day (BID) | ORAL | 0 refills | Status: AC | PRN

## 2024-11-19 NOTE — Progress Notes (Signed)
 Patient transferred to the [facility] program. Communicated with patient via video tablet. AAOx4. Denies pain and SOB. Oxygen on at 2L. Plan of care reviewed with patient. Patient was told that the virtual nurse is available 24/7. [facility] phone number given to patient. Tablet usage explained. Skin check completed with Tyrell, paramedic.

## 2024-11-19 NOTE — Care Management (Signed)
 Transition of Care [facility]) - Inpatient Brief Assessment   Patient Details  Name: Veronica Barrett MRN: [id number] Date of Birth: [birth year]  Transition of Care [facility]) CM/SW Contact:    Corean JAYSON Canary, RN Phone Number: 11/19/2024, 4:23 PM   Clinical Narrative:  74 year old patient presented 12/31 with sputum production shob from home where she was independent.   She has family support in adult children. She is being treated for pneumonia. Was on 3LPM, today she ambulated with OT PT without oxygen need and maintained saturations over 90% She has transitioned to the hospital at home program  [facility] [place] will follow  Transition of Care Asessment: Insurance and Status: Insurance coverage has been reviewed Patient has primary care physician: Yes Home environment has been reviewed: Single Family Home Prior level of function:: Independent Prior/Current Home Services: No current home services Social Drivers of Health Review:  (no assessment) Readmission risk has been reviewed: Yes Transition of care needs: no transition of care needs at this time

## 2024-11-19 NOTE — Assessment & Plan Note (Signed)
 11/19/2024 admission HgB of 11.9 g/dl. Steadily decline. Today HgB 9.8 g/dl. No reported bleeding. May be due to viral illness. Check Fe, TIBC, % sat in AM.

## 2024-11-19 NOTE — Progress Notes (Signed)
 [facility]  medic out to see patient for admission. Patient was picked up in her hospital room by writer. Pt dressed and belongings were gathered from the room and placed in patient belongings bags and pts personal bags. Pt was escorted down to the SUV via wheelchair. Pt was taken home where we met Steffan EMT-P who assisted in the admission process. Pt walked inside and was greeted by family. See flowsheet for assessment of patient. Skin check was completed with assistance from virtual RN. Medications were sorted by Steffan EMT-P. Education was provided about the program and how to use [facility] equipment. Pt and family were made aware of how to take medication by using the tablet to call into the virtual RN. All questions and concerns from the family was answered at this time. No other tasks were needed at this time. [facility]  visit complete.

## 2024-11-19 NOTE — Evaluation (Signed)
 Occupational Therapy Evaluation and Discharge Patient Details Name: Veronica Barrett MRN: [id number] DOB: 06-02-1951 Today's Date: 11/19/2024   History of Present Illness   74 y.o. female presents to [facility] hospital on 11/16/2024 with fever, cough and SOB. Imaging concerning for PNA. PMH includes asthma, HLD, GERD.     Clinical Impressions At baseline, pt is Independent to Mod I with ADLs, IADLs, and functional mobility with intermittent use of SPC. Pt now presents with decreased activity tolerance and impaired cardiopulmonary status, but is now at or near baseline functional level with ADLs. Pt currently demonstrating ability to complete ADLs and functional transfers/mobility within room without an AD with Mod I. However, OT recommends Supervision for functional mobility and showering at this time for safety secondary to pt's current cardiopulmonary status with pt reporting understanding and agreement of recommendation. Pt HR in the 70s and O2 sat largely >/93% on RA this session, but with one brief drop in O2 sat to 91% on RA during ambulation. Pt O2 sat quickly recovered to 94% on RA with seated rest break and PLB. OT also educated pt in recommendation for continued monitoring/tracking O2 sat at home and use of energy conservation strategies for increased safety and independence with pt verbalizing understanding of all training. Pt demonstrates good insight into deficits and good safety awareness during tasks. Pt's daughter is currently staying with her and can provided 24/7 supervision/assistance with ADLs and IADLs. As pt is at or near baseline functional level with ADLs and currently receiving acute skilled PT services to further address mobility and activity tolerance, no further benefit from acute OT services at this time. No post acute OT needs anticipated. OT is signing off at this time. Please reconsult as needed.     If plan is discharge home, recommend the following:   A little help with walking  and/or transfers;A little help with bathing/dressing/bathroom;Assistance with cooking/housework;Assist for transportation;Help with stairs or ramp for entrance     Functional Status Assessment   Patient has had a recent decline in their functional status and demonstrates the ability to make significant improvements in function in a reasonable and predictable amount of time.     Equipment Recommendations   None recommended by OT (Pt already has needed equipment)     Recommendations for Other Services         Precautions/Restrictions   Precautions Precautions: Fall Recall of Precautions/Restrictions: Intact Precaution/Restrictions Comments: monitor SpO2 Restrictions Weight Bearing Restrictions Per Provider Order: No     Mobility Bed Mobility               General bed mobility comments: Pt ambulating from bathroom to recliner upon OT arrival and sitting in recliner at end of session    Transfers Overall transfer level: Modified independent Equipment used: None                      Balance Overall balance assessment: Needs assistance Sitting-balance support: No upper extremity supported, Feet supported Sitting balance-Leahy Scale: Good     Standing balance support: No upper extremity supported, During functional activity Standing balance-Leahy Scale: Good                             ADL either performed or assessed with clinical judgement   ADL Overall ADL's : Modified independent  General ADL Comments: Pt with decreased activity tolerance and requiring intermittent rest breaks during tasks, but with pt demonstrating ability wo complete ADLs largely with Mod I. Pt also demonstrating ability to perform functional mobility from bathroom to recliner with Mod I; however, recommend Supervision for mobility and showering at this time for safety due to brief drop in O2 sat to 91% on RA with  ambulation with O2 sat quickly recovering with seated rest and PLB. OT initiated education in energy conservation strategies, including use of shower chair and PLB, with pt demonstrating understanding through teach back.     Vision Baseline Vision/History: 1 Wears glasses (readers) Ability to See in Adequate Light: 0 Adequate Patient Visual Report: No change from baseline Vision Assessment?: No apparent visual deficits;Wears glasses for reading Additional Comments: Vision [facility] for tasks assessed; vision not formally screened or assessed     Perception         Praxis         Pertinent Vitals/Pain Pain Assessment Pain Assessment: No/denies pain Pain Intervention(s): Monitored during session     Extremity/Trunk Assessment Upper Extremity Assessment Upper Extremity Assessment: Overall WFL for tasks assessed   Lower Extremity Assessment Lower Extremity Assessment: Defer to PT evaluation   Cervical / Trunk Assessment Cervical / Trunk Assessment: Normal   Communication Communication Communication: Impaired Factors Affecting Communication: Hearing impaired (wears hearing aides)   Cognition Arousal: Alert Behavior During Therapy: WFL for tasks assessed/performed Cognition: No apparent impairments             OT - Cognition Comments: Pt AAOx4 and pleasant throughout session with cognition WFL for tasks assessed.                 Following commands: Intact       Cueing  General Comments   Cueing Techniques: Verbal cues  Pt HR in the 70s and O2 sat largely >/93% on RA during session with O2 briefly dropping once to 91% on RA during ambulation. O2 sat recoverred quickly to 94% on RA with seated rest break and PLB. Pt reports she has a pulse-oximeter at home and knows how to use it. OT educated pt on recommendation to track O2 sat, especially with activity and to notify RN or Physician of any concerns. [facility] staff arriving to transport pt at end of  session.   Exercises     Shoulder Instructions      Home Living Family/patient expects to be discharged to:: Private residence Living Arrangements: Alone Available Help at Discharge: Family;Available 24 hours/day (daughter is visiting until 11/27/2024 and can provide assistance with ADLs and IADLs) Type of Home: House Home Access: Stairs to enter [employer] of Steps: 5 Entrance Stairs-Rails: Can reach both Home Layout: Two level;Able to live on main level with bedroom/bathroom Alternate Level Stairs-Number of Steps: flight   Bathroom Shower/Tub: Tub/shower unit;Curtain         Home Equipment: Cane - single Librarian, Academic (2 wheels);Wheelchair - Manufacturing Systems Engineer;Other (comment) (pulse-oximeter)          Prior Functioning/Environment Prior Level of Function : Independent/Modified Independent;Driving             Mobility Comments: Ind to Mod I; ambulatory with PRN use of SPC when fatigued, primarily when in the community ADLs Comments: Ind to Mod I with ADLs; reports use of shower chair; enjoys spending time with her family, especially her grandchildren    OT Problem List: Decreased activity tolerance   OT Treatment/Interventions:  OT Goals(Current goals can be found in the care plan section)   Acute Rehab OT Goals Patient Stated Goal: to return home and continue to feel better OT Goal Formulation: All assessment and education complete, DC therapy   OT Frequency:       Co-evaluation              AM-PAC OT 6 Clicks Daily Activity     Outcome Measure Help from another person eating meals?: None Help from another person taking care of personal grooming?: None Help from another person toileting, which includes using toliet, bedpan, or urinal?: None Help from another person bathing (including washing, rinsing, drying)?: A Little Help from another person to put on and taking off regular upper body clothing?: None Help from another  person to put on and taking off regular lower body clothing?: None 6 Click Score: 23   End of Session Nurse Communication: Mobility status;Other (comment) (Vital signs. No further benefit from acute OT and no post-acute OT needs anticipated.)  Activity Tolerance: Patient tolerated treatment well Patient left: in chair;with call bell/phone within reach;Other (comment) (with [facility] staff arriving to transport pt)  OT Visit Diagnosis: Other (comment) (decreased activity tolerance)                Time: 8474-8460 OT Time Calculation (min): 14 min Charges:  OT General Charges $OT Visit: 1 Visit OT Evaluation $OT Eval Low Complexity: 1 Low  Margarie Rockey HERO., OTR/L, MA Acute Rehab [phone]   Margarie FORBES Horns 11/19/2024, 4:00 PM

## 2024-11-19 NOTE — Progress Notes (Signed)
 [facility]. [place] paramedic transporting pt off the floor at this time. Pts IV is intact. Pt and belongings leaving at this time.

## 2024-11-19 NOTE — Progress Notes (Addendum)
 1946-Video call completed: RN introduction and patient identifiers completed. Patient A&O x 4, sitting up in a chair, [place] intact. Patient reported and confirmed all questions were answered, overnight monitoring and medication administration plan reviewed. Patient reported she prefers to take scheduled nebulizers during the day due to being unable to sleep. Patient reported and confirmed she had not been taking singular for some time. Patient informed provider would be updated on mediation updates. Patient agreed to additional video call closer to 2100 for self-administered medication assistance. Patient encouraged to contact as needed, [facility] contact information reviewed.    2004- APP notified of patient reported updates related to scheduled evening nebulizers and Singular medication.   [id number]-Cpizn call completed -- Patient identifiers completed. Patient denied need for nebulizers and singular. Patient medication self- administered /with Armed [employer] Technical Officer per [facility] protocol for one medication. Alerts for Bradycardia for Pulse Rate addressed, patient reported and confirmed bradycardia is a known issue. Patient has own pulse oximeter last reading of 52 reported. Patient confirmed being asymptomatic with reported HR.   2356--Alarm Bradycardia for Pulse Rate-Patient called, unable to reach. left message to return call to [facility]. Will continue to monitor.

## 2024-11-19 NOTE — Assessment & Plan Note (Signed)
 11/19/2024 RA sats noted to be 88% on 11-16-2024 while she was still in ER.  Pt remains on 2-3 L/min O2. Pt is not on home O2. Wean as tolerated. 1/5: persistent with symptomatic shortness of desaturation of 89% on RA with ambulation. O2 DME ordered on admission. TOC consulted

## 2024-11-19 NOTE — Progress Notes (Signed)
 " PROGRESS NOTE    Veronica Barrett  FMW:[id number] DOB: [birth year] DOA: 11/16/2024 PCP: Gayle [name], PA-C  Subjective: Pt seen and examined. [facility] program introduced to her and her dtr(olivia Faggart via phone).  Program requirements and benefits discussed with pt and dtr.  Pt and dtr(olivia) are agreeable to participation in [facility] program. Written consent obtained and witnessed by charge RN.   Hospital Course: CC: SOB, fever HPI: Ms. Veronica Barrett is a 74 year old female with history of hyperlipidemia, chronic hemorrhoids, iron  deficiency anemia, GERD, asthma, not on oxygen.  11/16/2024: Patient presented to the ED for chief concerns of shortness of breath, cough with green sputum, and fever over the last 3 days.  12/31: [facility] admitted [facility].  Pending bed availability.  At the time of my evaluation, patient had temperature of 98.5, respiration rate 12, heart rate 60, blood pressure 101/51, SpO2 94% on 3 L nasal cannula.  Serum sodium is 138, potassium 3.3, chloride 101, bicarb 22, BUN of 11, serum creatinine 0.62, eGFR greater than 60, nonfasting blood glucose 129, WBC 12.5, hemoglobin 11.9, platelets of 307.  COVID/influenza A/influenza B/RSV PCR were negative.  Lactic acid was initially 2.2 and on repeat was 1.4.  UA showed trace leukocytes and negative for nitrites.  ED treatment: LR 1.5 L bolus, Solu-Medrol  to 50 mg IV one-time dose, magnesium  2 g IV one-time dose, azithromycin  500 mg IV, ceftriaxone  2 g IV one-time dose. Per EDP, patient desated to 88% on RA and was placed on 3 L [place] with appropriate improvement.  11/17/2024: I assumed care of the patient.  Virtual admission completed.  Significant Events: Admitted 11/16/2024 for sepsis due to pneumonia 11-18-2023 IV abx, IV steroids continued 11-19-2023 pt still requiring supplemental O2. RVP positive metapneumo virus.  Remains on O2 2-3 L/min. Still wheezing.  Admission  Labs: Covid/flu/rsv negative Na 138, K 3.3, CO2 of 22, BUN 11, scr 0.62, glu 129 WBC 12.5, HgB 11.9, plt 307 T. Prot 6.8, AST 18, ALT 15, alk phos 98, t. Bili 0.6 Lactic acid 2.2 UA negative nitrite, trace LE  Admission Imaging Studies: CXR Airspace opacity at the left lung base, predominantly platelike, favoring atelectasis; retrocardiac pneumonia cannot be completely excluded. CTPA  No evidence of pulmonary embolism. 2. Multifocal pneumonia with dense consolidation throughout the left lower lobe. 3. Mildly enlarged bilateral hilar lymph nodes, each measuring 11 mm, likely reactive.  Significant Labs: RVP Positive for Metapneumovirus  Significant Imaging Studies:   Antibiotic Therapy: Anti-infectives (From admission, onward)    Start     Dose/Rate Route Frequency Ordered Stop   11/17/24 1215  cefTRIAXone  (ROCEPHIN ) 2 g in sodium chloride  0.9 % 100 mL IVPB        2 g 200 mL/hr over 30 Minutes Intravenous Every 24 hours 11/17/24 1204 11/21/24 1214   11/17/24 1215  azithromycin  (ZITHROMAX ) 500 mg in sodium chloride  0.9 % 250 mL IVPB        500 mg 250 mL/hr over 60 Minutes Intravenous Every 24 hours 11/17/24 1204 11/21/24 1214   11/16/24 1745  cefTRIAXone  (ROCEPHIN ) 2 g in sodium chloride  0.9 % 100 mL IVPB        2 g 200 mL/hr over 30 Minutes Intravenous Once 11/16/24 1740 11/16/24 2001   11/16/24 1745  azithromycin  (ZITHROMAX ) 500 mg in sodium chloride  0.9 % 250 mL IVPB        500 mg 250 mL/hr over 60 Minutes Intravenous  Once 11/16/24 1740 11/16/24  1907       Procedures:   Consultants:     Assessment and Plan: * Sepsis due to pneumonia (HCC) 11-17-2024 Acute hypoxic respiratory failure Patient meets severe sepsis criteria with elevated lactic acid of 2.2 initially, WBC elevation at 12.5, source of infection is multilobar pneumonia with left lower lobe being the worst Blood cultures x 2 are in process Continue with ceftriaxone  2 g IV daily, azithromycin  500 mg IV  daily to complete a 5-day course Continue with LR infusion at 125 mL/h Patient is status post 1.5 mL liter bolus Maintain MAP > 65 Continue O2 supplementation to maintain spO2 > 92%  Multifocal pneumonia 11-17-2024 CAP Continue with azithromycin  500 mg IV daily, ceftriaxone  2 g IV daily to complete a 5-day course DuoNebs every 6 hours Albuterol  nebulizer every 4 hours as needed for wheezing and shortness of breath Flutter valve, incentive spirometry  11/19/2024 continue with IV rocephin  2 gram daily. Change to po zithromax . Continue with neb treatment. Start scheduled nebs at 9 am and 4 pm. Continue with prn nebs.  Continue tussionex to help with cough that is keeping her awake at night.   Acute respiratory failure with hypoxia (HCC) 11/19/2024 RA sats noted to be 88% on 11-16-2024 while she was still in ER.  Pt remains on 2-3 L/min O2. Pt is not on home O2. Wean as tolerated.   Normocytic anemia 11/19/2024 admission HgB of 11.9 g/dl. Steadily decline. Today HgB 9.8 g/dl. No reported bleeding. May be due to viral illness. Check Fe, TIBC, % sat in AM.   Hypokalemia 11/17/2024 Potassium chloride  40 mEq p.o. one-time dose Recheck BMP in the AM Check serum magnesium  level on admission  11/19/2024 resolved. K 4.7 today.   Moderate persistent asthma 11-17-2024 With acute exacerbation Home long-acting inhaler equivalent resumed DuoNebs qid, 3 doses scheduled on admission Albuterol  nebulizer every 4 hours as needed for wheezing and shortness of breath grams to at bedtime milligrams daily resumed  11/19/2024 Start scheduled nebs at 9 am and 4 pm. Continue with prn nebs.    Cough 11/19/2024 continue Guaifenesin  5 ml every 6 hours as needed to loosen phlegm and cough during the day. Tussionex 5 mL nightly as needed for coughing at night  Hemorrhoids 11/19/2024 continue prn hydrocortisone  25 mg suppository anal itching, hemorrhoids  GERD (gastroesophageal reflux disease) 11/19/2024 continue with  pepcid  and protonix . Apparently pt has severe reflux.  Hyperlipidemia 11/19/2024 continue atorvastatin  20 mg daily  Depression, recurrent 11/19/2024 continue citalopram  30 mg daily      DVT prophylaxis: enoxaparin  (LOVENOX ) injection 40 mg Start: 11/17/24 1215 Place TED hose Start: 11/17/24 1204 Place and maintain sequential compression device Start: 11/16/24 2230     Code Status: Full Code Family Communication: discussed with pt and dtr Olivia Faggart(via phone Disposition Plan: home Reason for continuing need for hospitalization: remains on IV ABX, IV steroids, supplemental O2.  Objective: Vitals:   11/19/24 0000 11/19/24 0400 11/19/24 0733 11/19/24 0734  BP: 121/62 121/73    Pulse:      Resp:      Temp: 97.7 F (36.5 C) 98.1 F (36.7 C)    TempSrc: Oral Oral    SpO2:   94% 94%  Weight:      Height:       No intake or output data in the 24 hours ending 11/19/24 0920 Filed Weights   11/17/24 0708  Weight: 73.9 kg    Examination:  Physical Exam Vitals and nursing note reviewed.  Constitutional:  General: She is not in acute distress.    Appearance: She is not toxic-appearing.     Comments: Appears ill  HENT:     Head: Normocephalic and atraumatic.  Eyes:     General: No scleral icterus. Cardiovascular:     Rate and Rhythm: Normal rate and regular rhythm.  Pulmonary:     Effort: Pulmonary effort is normal.     Comments: Coarse diminished BS. Rales at bases Occ end expiratory wheeze No distress No accessory muscle use. Abdominal:     General: Bowel sounds are normal. There is no distension.     Palpations: Abdomen is soft.  Musculoskeletal:     Right lower leg: No edema.     Left lower leg: No edema.  Skin:    General: Skin is warm and dry.     Capillary Refill: Capillary refill takes less than 2 seconds.  Neurological:     Mental Status: She is alert and oriented to person, place, and time.     Data Reviewed: I have personally reviewed  following labs and imaging studies  CBC: Recent Labs  Lab 11/16/24 1411 11/17/24 1327 11/18/24 0421  WBC 12.5* 7.6 9.7  HGB 11.9* 10.6* 9.8*  HCT 35.6* 32.7* 29.3*  MCV 92.2 94.0 93.9  PLT 307 252 254   Basic Metabolic Panel: Recent Labs  Lab 11/16/24 1411 11/17/24 1327 11/18/24 0421  NA 138 137 139  K 3.3* 3.9 4.7  CL 101 105 108  CO2 22 22 24   GLUCOSE 129* 130* 144*  BUN 11 9 12   CREATININE 0.62 0.46 0.52  CALCIUM  9.4 8.3* 8.7*  MG  --  2.4  --    GFR: Estimated Creatinine Clearance: 64 mL/min (by C-G formula based on SCr of 0.52 mg/dL). Liver Function Tests: Recent Labs  Lab 11/16/24 1411 11/17/24 1327  AST 18 14*  ALT 15 13  ALKPHOS 97 86  BILITOT 0.6 0.4  PROT 6.8 6.1*  ALBUMIN 3.9 3.5   Coagulation Profile: Recent Labs  Lab 11/16/24 1621  INR 1.0   Sepsis Labs: Recent Labs  Lab 11/16/24 1621 11/16/24 1821  LATICACIDVEN 2.2* 1.4    Recent Results (from the past 240 hours)  Blood Culture (routine x 2)     Status: None (Preliminary result)   Collection Time: 11/16/24  4:21 PM   Specimen: BLOOD  Result Value Ref Range Status   Specimen Description   Final    BLOOD LEFT ANTECUBITAL Performed at [facility], [address] [place], [place] [address]    Special Requests   Final    BOTTLES DRAWN AEROBIC AND ANAEROBIC Blood Culture adequate volume Performed at [facility], [address], [place], [place] [address]    Culture   Final    NO GROWTH 2 DAYS Performed at [facility], [address] [place], [place] [address]    Report Status PENDING  Incomplete  Blood Culture (routine x 2)     Status: None (Preliminary result)   Collection Time: 11/16/24  4:26 PM   Specimen: BLOOD  Result Value Ref Range Status   Specimen Description   Final    BLOOD RIGHT ANTECUBITAL Performed at [facility], [address], [place], [place] [address]    Special Requests   Final     BOTTLES DRAWN AEROBIC AND ANAEROBIC Blood Culture adequate volume Performed at [facility], [address] [place], [place] [address]    Culture   Final  NO GROWTH 2 DAYS Performed at [facility], [address] [place], [place] [address]    Report Status PENDING  Incomplete  Resp panel by RT-PCR (RSV, Flu A&B, Covid) Anterior Nasal Swab     Status: None   Collection Time: 11/16/24  4:36 PM   Specimen: Anterior Nasal Swab  Result Value Ref Range Status   SARS Coronavirus 2 by RT PCR NEGATIVE NEGATIVE Final    Comment: (NOTE) SARS-CoV-2 target nucleic acids are NOT DETECTED.  The SARS-CoV-2 RNA is generally detectable in upper respiratory specimens during the acute phase of infection. The lowest concentration of SARS-CoV-2 viral copies this assay can detect is 138 copies/mL. A negative result does not preclude SARS-Cov-2 infection and should not be used as the sole basis for treatment or other patient management decisions. A negative result may occur with  improper specimen collection/handling, submission of specimen other than nasopharyngeal swab, presence of viral mutation(s) within the areas targeted by this assay, and inadequate number of viral copies(<138 copies/mL). A negative result must be combined with clinical observations, patient history, and epidemiological information. The expected result is Negative.  Fact Sheet for Patients:  bloggercourse.com  Fact Sheet for Healthcare Providers:  seriousbroker.it  This test is no t yet approved or cleared by the [place]  FDA and  has been authorized for detection and/or diagnosis of SARS-CoV-2 by FDA under an Emergency Use Authorization (EUA). This EUA will remain  in effect (meaning this test can be used) for the duration of the COVID-19 declaration under Section 564(b)(1) of the Act, 21 U.S.C.section 360bbb-3(b)(1), unless the authorization  is terminated  or revoked sooner.       Influenza A by PCR NEGATIVE NEGATIVE Final   Influenza B by PCR NEGATIVE NEGATIVE Final    Comment: (NOTE) The Xpert Xpress SARS-CoV-2/FLU/RSV plus assay is intended as an aid in the diagnosis of influenza from Nasopharyngeal swab specimens and should not be used as a sole basis for treatment. Nasal washings and aspirates are unacceptable for Xpert Xpress SARS-CoV-2/FLU/RSV testing.  Fact Sheet for Patients: bloggercourse.com  Fact Sheet for Healthcare Providers: seriousbroker.it  This test is not yet approved or cleared by the [place]  FDA and has been authorized for detection and/or diagnosis of SARS-CoV-2 by FDA under an Emergency Use Authorization (EUA). This EUA will remain in effect (meaning this test can be used) for the duration of the COVID-19 declaration under Section 564(b)(1) of the Act, 21 U.S.C. section 360bbb-3(b)(1), unless the authorization is terminated or revoked.     Resp Syncytial Virus by PCR NEGATIVE NEGATIVE Final    Comment: (NOTE) Fact Sheet for Patients: bloggercourse.com  Fact Sheet for Healthcare Providers: seriousbroker.it  This test is not yet approved or cleared by the [place]  FDA and has been authorized for detection and/or diagnosis of SARS-CoV-2 by FDA under an Emergency Use Authorization (EUA). This EUA will remain in effect (meaning this test can be used) for the duration of the COVID-19 declaration under Section 564(b)(1) of the Act, 21 U.S.C. section 360bbb-3(b)(1), unless the authorization is terminated or revoked.  Performed at [facility], [address] [place], [place] [address]   Respiratory (~20 pathogens) panel by PCR     Status: Abnormal   Collection Time: 11/18/24  7:07 AM   Specimen: Nasopharyngeal Swab; Respiratory  Result Value Ref Range Status    Adenovirus NOT DETECTED NOT DETECTED Final   Coronavirus 229E NOT DETECTED NOT DETECTED Final    Comment: (NOTE)  The Coronavirus on the Respiratory Panel, DOES NOT test for the novel  Coronavirus (2019 nCoV)    Coronavirus HKU1 NOT DETECTED NOT DETECTED Final   Coronavirus NL63 NOT DETECTED NOT DETECTED Final   Coronavirus OC43 NOT DETECTED NOT DETECTED Final   Metapneumovirus DETECTED (A) NOT DETECTED Final   Rhinovirus / Enterovirus NOT DETECTED NOT DETECTED Final   Influenza A NOT DETECTED NOT DETECTED Final   Influenza B NOT DETECTED NOT DETECTED Final   Parainfluenza Virus 1 NOT DETECTED NOT DETECTED Final   Parainfluenza Virus 2 NOT DETECTED NOT DETECTED Final   Parainfluenza Virus 3 NOT DETECTED NOT DETECTED Final   Parainfluenza Virus 4 NOT DETECTED NOT DETECTED Final   Respiratory Syncytial Virus NOT DETECTED NOT DETECTED Final   Bordetella pertussis NOT DETECTED NOT DETECTED Final   Bordetella Parapertussis NOT DETECTED NOT DETECTED Final   Chlamydophila pneumoniae NOT DETECTED NOT DETECTED Final   Mycoplasma pneumoniae NOT DETECTED NOT DETECTED Final    Comment: Performed at [facility], [address] [place], [place] [address]     Scheduled Meds:  arformoterol   15 mcg Nebulization BID   ascorbic acid   250 mg Oral Daily   atorvastatin   20 mg Oral Daily   budesonide  (PULMICORT ) nebulizer solution  0.25 mg Nebulization BID   calcium -vitamin D   1 tablet Oral Daily   citalopram   30 mg Oral Daily   enoxaparin  (LOVENOX ) injection  40 mg Subcutaneous Q24H   famotidine   20 mg Oral Daily   ferrous gluconate   324 mg Oral Q breakfast   fluticasone   2 spray Each Nare Daily   ipratropium-albuterol   3 mL Nebulization TID   loratadine   10 mg Oral Daily   methylPREDNISolone  (SOLU-MEDROL ) injection  40 mg Intravenous Daily   montelukast   10 mg Oral QHS   multivitamin with minerals  1 tablet Oral Daily   pantoprazole   40 mg Oral Daily   Continuous Infusions:   azithromycin  500 mg (11/18/24 1335)   cefTRIAXone  (ROCEPHIN )  IV 2 g (11/18/24 1105)     LOS: 2 days   Time spent: 65 minutes  Camellia Door, DO  [facility]  11/19/2024, 9:20 AM   Hospital at Home Admission Criteria Checklist:  Formal consent explained in detail and signed at the bedside: yes Patient meets inpatient admission criteria (see below for further details) yes Is pt Medicare FFS/[employer] Medicare-Medicaid, [employer], [employer] ( required for initial launch with plan to expand)? yes Lives within 25 mil/ 30 min from [facility] within [place](pt may stay with family member during admission who lives within 25 miles or 30 min from [facility] w[facility]in [facility])? yes Hemodynamically stable with relatively low risk of clinical deterioration-not requiring ICU? yes Age >55? yes Does not require frequent touch-points or complex interventions/medications (ie Titrated Infusions (IV insulin, heparin drips, vasoactive drips, use of infused or injectable controlled substances, patients on insulin)? yes Any Behavioral Health comorbidities likely to increase risk for in-home care (ie Acute delirium or experiencing a marked altered mental status and cause is not a treatable condition in the home)? no Has the patient been on BIPAP during course of ED evaluation or hospitalization? no IF YES, Has the patient been off of BIPAP for >24 hours(If NO-THEN PATIENT DOES MEET INCLUSION CRITERIA)? not applicable On Room Air or Needs oxygen at home (<6L)? is not on home oxygen therapy. Active safety concerns (ie Unable to use bedside commode independently and lacks caregiver support for safety- needs SNF placement, unable  to obtain IV access)? no Has skin check been performed? yes  Has Physical Therapy screened the patient? yes  Common admission diagnoses including: CAP, COPD Exacerbation, Acute on chronic heart failure, Cellulitis, UTI , dehydration, acute resp failure with hypoxia (requiring <5L)    Social Screening:  - Has the family been directly contacted about Hospital at Home program with consent obtained (if yes- please document who was spoken to with name and phone number)? yes Olivia Faggart [phone]  -Was the family approached about the use of TOC pharmacy for medications at discharge? yes Denies significant ETOH intake? yes Does not smoke and understands may not smoke in the presence of oxygen? yes Patient states able to use iPad/phone for communication/has family who is able to use? yes Patient has agreed to be compliant with medication and treatment regimen of the program? yes Any active drug use in patient or primary caregiver including daily dosing of methadone? no Stable home environment ( access to appropriate heating in cold conditions and/or appropriate air conditioning in hot conditions and/or no running water/electricity)? yes  No aggressive pets at home? yes Firearm present? no  With ability or willingness to store them unloaded in a locked case for duration of hospitalization? not applicable Ambulatory? yes  no difficulty Bed bugs present on home evaluation? no Family support system in place? yes Patient feels safe at home and does not endorse any violence? yes Any actively decompensated behavioral health issues including agitation/aggressive behavior? no  Patient requests food to be provided by hospital home program? no PT/OT eval completed and not requiring SNF, ALF, inpatient rehab? yes  To be admitted to the Hospital at [facility] program, a patient generally must meet the following: 1. Requirement for Inpatient Level of Care: The patient's condition must necessitate an inpatient level of care. This is typically indicated by one or more of the following, depending on their specific diagnosis:  Persistent tachycardia despite appropriate treatment (e.g., for Heart Failure, UTI). Persistent tachypnea (rapid breathing) or dyspnea (shortness of breath) that hasn't  improved sufficiently with observation care (e.g., for Heart Failure, Pneumonia, Viral Illness, COVID). Hypoxemia (low oxygen levels), such as a new need for oxygen, an increased need from baseline, or specific oxygen saturation levels (e.g., SpO2 <90-94% depending on the condition) that persist despite observation (e.g., for Heart Failure, COPD, Pneumonia, Viral Illness, COVID). Need for Intravenous (IV) hydration due to an inability to maintain oral hydration, which persists despite observation care (e.g., for Cellulitis, UTI, Viral Illness, COVID). Specific to Heart Failure: Persistent pulmonary edema, indicated by a new oxygen need, lack of improvement with IV diuretics, and ongoing tachypnea/dyspnea. Specific to COPD: A decrease in known baseline resting oxygen saturation (SpO2) by 4% or more, or an increase in pre-existing supplemental oxygen requirements, which persists despite observation and requires continued close monitoring. Specific to Pneumonia: A Pneumonia Severity Index (PSI) class IV (moderate risk). Specific to Cellulitis: Failure of outpatient antibiotic therapy (indicated by progression or no improvement after a minimum of 48 hours on an adequate regimen) or a clinical presentation (like acuity or rapidity of progression) that requires the intensity of monitoring found in an inpatient setting. Specific to UTI: Persistence or worsening of clinical findings like fever, pain, or dehydration despite observation care; presence of significant uropathy; suspected infection of an indwelling prosthetic device, stent, implant, or graft; or pregnancy with suspected pyelonephritis.  2. Appropriateness for Hospital at Home Setting: The patient's overall clinical picture, including the severity of their illness, their care needs, and  their medical history and comorbidities, must be suitable for management in the Hospital at Home environment. This essentially means that none of the exclusion  criteria (listed below) are met.  Unified Exclusion Criteria for Hospital at Home Admission: A patient would not be eligible for Hospital at Home if any of the following are present: Hemodynamic Instability: Hypotension (low blood pressure) is present. Respiratory Instability or Needs Beyond Program Capability: There is a new need for invasive or noninvasive ventilatory assistance (like BiPAP or a ventilator). Oxygenation is not sufficient, generally indicated if an FiO2 (fraction of inspired oxygen) of 45% (which is about 6 Liters/minute via nasal cannula) or more is required to keep oxygen saturation (SpO2) at 90% or greater. Monitoring or Procedural Needs Beyond Program Capability: There is a need for invasive monitoring, such as a pulmonary artery catheter or an arterial line. There is a need for immediate-response telemetry monitoring (for dangerous arrhythmia detection and subsequent immediate intervention). The required medication regimen is beyond the capabilities of Hospital at Home (e.g., dosing intervals are too frequent for home administration). There is a need for a procedure that cannot be performed by the [facility] at [facility] team (e.g., significant wound debridement or abscess drainage for cellulitis, or percutaneous nephrostomy for a complicated UTI). Significant Organ Dysfunction or Markers of Severe Illness: Mental status is not at baseline, or there is altered mental status suggestive of inadequate perfusion. Renal (kidney) function is unstable or showing an ongoing decline. There is evidence of inadequate perfusion, such as metabolic acidosis or myocardial ischemia. Uncompensated acidosis is present. Condition-Specific Severity or Complications Making Home Care Unsuitable: For Heart Failure: Known severe cardiac valvular disease (e.g., aortic stenosis, mitral regurgitation); or severe peripheral edema that impairs the ability to urinate or ambulate. For COPD: Known concurrent  comorbidity or finding that indicates a higher-risk COPD exacerbation (e.g., pulmonary fibrosis, cavitation, pleural effusion, pneumothorax, rib fracture). For Pneumonia: Pneumonia Severity Index (PSI) class V (indicating high risk for inpatient mortality); known concurrent comorbidity or finding that indicates higher-risk pneumonia (e.g., pulmonary fibrosis, cavitation, large or loculated pleural effusion); or a concomitant serious infectious process like endocarditis or empyema. For Cellulitis: Orbital, periorbital, or necrotizing infection is suspected; or a concomitant serious infectious process like endocarditis, septic emboli, or septic joint space infection. For UTI: Urinary tract obstruction (e.g., kidney stone, bladder outlet obstruction); or a concomitant serious infectious process like endocarditis or septic emboli. For Viral Illness & COVID-19: A concomitant serious infectious process like endocarditis or empyema.  General Comorbidities or Status:  The patient is significantly immunosuppressed (this applies to Pneumonia, Cellulitis, UTI, Viral Illness, and COVID-19). The patient meets inpatient admission criteria for a second diagnosis, or has care needs beyond the capabilities of [facility] due to an active clinically significant comorbidity. (This is a general exclusion across all listed conditions)  "

## 2024-11-19 NOTE — Subjective & Objective (Signed)
 Pt seen and examined. [facility] program introduced to her and her [name] via phone).  Program requirements and benefits discussed with pt and dtr.  Pt and dtr(Veronica) are agreeable to participation in [facility] program. Written consent obtained and witnessed by charge RN.

## 2024-11-19 NOTE — Plan of Care (Signed)

## 2024-11-20 LAB — BASIC METABOLIC PANEL WITH GFR
Anion gap: 11 (ref 5–15)
BUN: 11 mg/dL (ref 8–23)
CO2: 25 mmol/L (ref 22–32)
Calcium: 9 mg/dL (ref 8.9–10.3)
Chloride: 104 mmol/L (ref 98–111)
Creatinine, Ser: 0.57 mg/dL (ref 0.44–1.00)
GFR, Estimated: >60 mL/min
Glucose, Bld: 80 mg/dL (ref 70–99)
Potassium: 4.1 mmol/L (ref 3.5–5.1)
Sodium: 140 mmol/L (ref 135–145)

## 2024-11-20 MED ORDER — ALBUTEROL SULFATE (2.5 MG/3ML) 0.083% IN NEBU
2.5000 mg | INHALATION_SOLUTION | Freq: Four times a day (QID) | RESPIRATORY_TRACT | Status: DC | PRN
  Filled 2024-11-20: qty 3

## 2024-11-20 MED ORDER — PREDNISONE 20 MG PO TABS
20.0000 mg | ORAL_TABLET | Freq: Every day | ORAL | Status: AC
  Administered 2024-11-21 – 2024-11-22 (×2): 20 mg via ORAL
  Filled 2024-11-20 (×2): qty 1

## 2024-11-20 MED ORDER — CITALOPRAM HYDROBROMIDE 10 MG PO TABS
30.0000 mg | ORAL_TABLET | Freq: Every day | ORAL | Status: DC
  Administered 2024-11-21: 30 mg via ORAL
  Filled 2024-11-20 (×3): qty 3

## 2024-11-20 MED ORDER — SODIUM CHLORIDE 0.9 % IV SOLN
2.0000 g | INTRAVENOUS | Status: AC
  Administered 2024-11-20 – 2024-11-22 (×3): 2 g via INTRAVENOUS
  Filled 2024-11-20 (×3): qty 20

## 2024-11-20 MED ORDER — METHYLPREDNISOLONE SODIUM SUCC 40 MG IJ SOLR
40.0000 mg | Freq: Every day | INTRAMUSCULAR | Status: AC
  Administered 2024-11-20: 40 mg via INTRAVENOUS
  Filled 2024-11-20: qty 1

## 2024-11-20 MED ORDER — AZITHROMYCIN 500 MG PO TABS
500.0000 mg | ORAL_TABLET | Freq: Every day | ORAL | Status: AC
  Administered 2024-11-20 – 2024-11-22 (×3): 500 mg via ORAL
  Filled 2024-11-20 (×3): qty 1

## 2024-11-20 NOTE — Plan of Care (Signed)
" °  Problem: Education: Goal: Knowledge of General Education information will improve Description: Including pain rating scale, medication(s)/side effects and non-pharmacologic comfort measures Outcome: Progressing   Problem: Health Behavior/Discharge Planning: Goal: Ability to manage health-related needs will improve Outcome: Progressing   Problem: Clinical Measurements: Goal: Ability to maintain clinical measurements within normal limits will improve Outcome: Progressing Goal: Will remain free from infection Outcome: Progressing Goal: Diagnostic test results will improve Outcome: Progressing Goal: Respiratory complications will improve Outcome: Progressing Goal: Cardiovascular complication will be avoided Outcome: Progressing   Problem: Activity: Goal: Risk for activity intolerance will decrease Outcome: Progressing   Problem: Nutrition: Goal: Adequate nutrition will be maintained Outcome: Progressing   Problem: Coping: Goal: Level of anxiety will decrease Outcome: Progressing   Problem: Elimination: Goal: Will not experience complications related to bowel motility Outcome: Progressing Goal: Will not experience complications related to urinary retention Outcome: Progressing   Problem: Pain Managment: Goal: General experience of comfort will improve and/or be controlled Outcome: Progressing   Problem: Safety: Goal: Ability to remain free from injury will improve Outcome: Progressing   Problem: Activity: Goal: Ability to tolerate increased activity will improve Outcome: Progressing   Problem: Skin Integrity: Goal: Risk for impaired skin integrity will decrease Outcome: Progressing   "

## 2024-11-20 NOTE — Progress Notes (Signed)
 0553--Patient called to reassess HR and SpO2, unable to reach. Left message to return call to [facility].

## 2024-11-20 NOTE — Progress Notes (Signed)
 Completed virtual rounds with MD,paramedic at patient bedside. POC reviewed and discussed ,patient voices understanding and agreement. Pt reminded to call RN for any needs, RN and MD available at all times. Pt voices understanding. Pt aware of next planned visit and next call from RN.

## 2024-11-20 NOTE — Progress Notes (Signed)
 Medic arrived at PT home to find patient resting comfortably in bed.   Patient evaluated and meds administered per [facility]. Patient urinated towards end of visit, did not complain of pain or difficulty urinating.   Lung sounds diminished in lower lobes, clear upper. No wheezing. Pt refused albuterol  treatments, said it gives her the shakes.  Temperature decrease from 99.7 to 98 oral after AM tylenol  administration.   IV began leaking after final flush and removed. Unable to replace line.   Visit completed.

## 2024-11-20 NOTE — Progress Notes (Signed)
 Spoke with pt via camera. Technical difficulties with ipad solved by EMT Faith. Supervised pt taking am medications. Pt did not want to take albuterol  and wanted to discuss with MD.

## 2024-11-20 NOTE — Plan of Care (Signed)
" °  Problem: Education: Goal: Knowledge of General Education information will improve Description: Including pain rating scale, medication(s)/side effects and non-pharmacologic comfort measures Outcome: Progressing   Problem: Clinical Measurements: Goal: Ability to maintain clinical measurements within normal limits will improve Outcome: Progressing   Problem: Clinical Measurements: Goal: Will remain free from infection Outcome: Progressing   Problem: Clinical Measurements: Goal: Diagnostic test results will improve Outcome: Progressing   Problem: Clinical Measurements: Goal: Respiratory complications will improve Outcome: Progressing   Problem: Clinical Measurements: Goal: Cardiovascular complication will be avoided Outcome: Progressing   Problem: Nutrition: Goal: Adequate nutrition will be maintained Outcome: Progressing   Problem: Elimination: Goal: Will not experience complications related to bowel motility Outcome: Progressing Goal: Will not experience complications related to urinary retention Outcome: Progressing   Problem: Pain Managment: Goal: General experience of comfort will improve and/or be controlled Outcome: Progressing   Problem: Safety: Goal: Ability to remain free from injury will improve Outcome: Progressing   Problem: Respiratory: Goal: Ability to maintain adequate ventilation will improve Outcome: Progressing Goal: Ability to maintain a clear airway will improve Outcome: Progressing   "

## 2024-11-20 NOTE — Progress Notes (Signed)
 Attempted to call patient via phone and tablet, and her daughter Schuyler via phone with no answer.

## 2024-11-20 NOTE — Progress Notes (Addendum)
 " PROGRESS NOTE - Telemedicine  Veronica Barrett  FMW:[id number] DOB: 10-29-1951 DOA: 11/16/2024 PCP: Gayle [name], PA-C   CC: SOB, fever HPI: Ms. Veronica Barrett is a 74 year old female with history of hyperlipidemia, chronic hemorrhoids, iron  deficiency anemia, GERD, asthma, not on oxygen.  11/16/2024: Patient presented to the ED for chief concerns of shortness of breath, cough with green sputum, and fever over the last 3 days.  12/31: Patient admitted to [facility].  Pending bed availability.  At the time of my evaluation, patient had temperature of 98.5, respiration rate 12, heart rate 60, blood pressure 101/51, SpO2 94% on 3 L nasal cannula.  Serum sodium is 138, potassium 3.3, chloride 101, bicarb 22, BUN of 11, serum creatinine 0.62, eGFR greater than 60, nonfasting blood glucose 129, WBC 12.5, hemoglobin 11.9, platelets of 307.  COVID/influenza A/influenza B/RSV PCR were negative.  Lactic acid was initially 2.2 and on repeat was 1.4.  UA showed trace leukocytes and negative for nitrites.  ED treatment: LR 1.5 L bolus, Solu-Medrol  to 50 mg IV one-time dose, magnesium  2 g IV one-time dose, azithromycin  500 mg IV, ceftriaxone  2 g IV one-time dose. Per EDP, patient desated to 88% on RA and was placed on 3 L [place] with appropriate improvement.  11/17/2024: I assumed care of the patient.  Virtual admission completed.  Admission Labs: Covid/flu/rsv negative Na 138, K 3.3, CO2 of 22, BUN 11, scr 0.62, glu 129 WBC 12.5, HgB 11.9, plt 307 T. Prot 6.8, AST 18, ALT 15, alk phos 98, t. Bili 0.6 Lactic acid 2.2 UA negative nitrite, trace LE  Admission Imaging Studies: CXR Airspace opacity at the left lung base, predominantly platelike, favoring atelectasis; retrocardiac pneumonia cannot be completely excluded. CTPA  No evidence of pulmonary embolism. 2. Multifocal pneumonia with dense consolidation throughout the left lower lobe. 3. Mildly enlarged bilateral hilar lymph nodes, each  measuring 11 mm, likely reactive.  Significant Labs: RVP Positive for Metapneumovirus  Antibiotic Therapy: Anti-infectives (From admission, onward)    Start     Dose/Rate Route Frequency Ordered Stop   11/17/24 1215  cefTRIAXone  (ROCEPHIN ) 2 g in sodium chloride  0.9 % 100 mL IVPB        2 g 200 mL/hr over 30 Minutes Intravenous Every 24 hours 11/17/24 1204 11/21/24 1214   11/17/24 1215  azithromycin  (ZITHROMAX ) 500 mg in sodium chloride  0.9 % 250 mL IVPB        500 mg 250 mL/hr over 60 Minutes Intravenous Every 24 hours 11/17/24 1204 11/21/24 1214   11/16/24 1745  cefTRIAXone  (ROCEPHIN ) 2 g in sodium chloride  0.9 % 100 mL IVPB        2 g 200 mL/hr over 30 Minutes Intravenous Once 11/16/24 1740 11/16/24 2001   11/16/24 1745  azithromycin  (ZITHROMAX ) 500 mg in sodium chloride  0.9 % 250 mL IVPB        500 mg 250 mL/hr over 60 Minutes Intravenous  Once 11/16/24 1740 11/16/24 1907     Significant Events: Admitted 11/16/2024 for sepsis due to pneumonia 11-18-2023 IV abx, IV steroids continued 11-19-2023 pt still requiring supplemental O2. RVP positive metapneumo virus.  Remains on O2 2-3 L/min. Still wheezing. 1/3: patient transferred to [facility] program. 1/4: I assumed care of pt. Day 5 of abx. O2 requirement changed from 3 L to 2 L. Prednisone  20 mg starting on 1/5, 2 doses ordered  Assessment & Plan:   Principal Problem:   Sepsis due to pneumonia [facility]) Active Problems:  Acute respiratory failure with hypoxia (HCC)   Multifocal pneumonia   Cough   Moderate persistent asthma   Hypokalemia   Normocytic anemia   Depression, recurrent   Hyperlipidemia   GERD (gastroesophageal reflux disease)   Hemorrhoids   Assessment and Plan:  * Sepsis due to pneumonia (HCC) 11-17-2024 - Acute hypoxic respiratory failure Patient meets severe sepsis criteria with elevated lactic acid of 2.2 initially, WBC elevation at 12.5, source of infection is multilobar pneumonia with left  lower lobe being the worst Continue with ceftriaxone  2 g IV daily, azithromycin  500 mg IV daily to complete a 5-day course Continue with LR infusion at 125 mL/h Patient is status post 1.5 mL liter bolus Maintain MAP > 65 Continue O2 supplementation to maintain spO2 > 92%  11/20/24: Blood cultures, day 4 NGTD  Multifocal pneumonia 11-17-2024 CAP Continue with azithromycin  500 mg IV daily, ceftriaxone  2 g IV daily to complete a 5-day course DuoNebs every 6 hours Albuterol  nebulizer every 4 hours as needed for wheezing and shortness of breath, flutter, IS  11/19/2024 continue with IV rocephin  2 gram daily. Change to po zithromax . Continue with neb treatment. Start scheduled nebs at 9 am and 4 pm. Continue with prn nebs.  Continue tussionex to help with cough that is keeping her awake at night  Acute respiratory failure with hypoxia (HCC) 11/19/2024 RA sats noted to be 88% on 11-16-2024 while she was still in ER.  Pt remains on 2-3 L/min O2. Pt is not on home O2. Wean as tolerated.  Normocytic anemia 11/19/2024 admission HgB of 11.9 g/dl. Steadily decline. Today HgB 9.8 g/dl. No reported bleeding. May be due to viral illness. Check Fe, TIBC, % sat in AM.  Hypokalemia 11/17/2024 Potassium chloride  40 mEq p.o. one-time dose Recheck BMP in the AM Check serum magnesium  level on admission 1/4: resolved; s potassium is 4.1  Moderate persistent asthma 11-17-2024 With acute exacerbation Home long-acting inhaler equivalent resumed DuoNebs qid, 3 doses scheduled on admission Albuterol  nebulizer every 4 hours as needed for wheezing and shortness of breath grams to at bedtime milligrams daily resumed 11/19/2024 Start scheduled nebs at 9 am and 4 pm. Continue with prn nebs.   Cough Continue Guaifenesin  5 ml every 6 hours as needed to loosen phlegm and cough during the day. Tussionex 5 mL nightly as needed for coughing at night  Hemorrhoids 11/19/2024 continue prn hydrocortisone  25 mg suppository anal  itching, hemorrhoids  GERD (gastroesophageal reflux disease) Continue with pepcid  and protonix ., pt has severe reflux.  Hyperlipidemia Continue atorvastatin  20 mg daily  Depression, recurrent 11/19/2024 continue citalopram  30 mg daily  DVT prophylaxis: ambulation as tolerated  Code Status: full code  Family Communication: family welcomed to be at bedside Disposition Plan: pending clinical course Level of care: [facility] Med-Surg  Consultants:  TOC, PT, OT  Antimicrobials: 11/20/24: day 5 of antibiotics (azithromycin  500 mg PO daily, ceftriaxone  2 g IV daily)   Subjective:  At bedside, patient was able to tell me her first and last name and date of birth. She reports she slept so well last night compared to being in the hospital.   She denies vision changes, nausea, vomiting, dysuria, hematuria. She reports her cough is much better and she feels her shortness of breath is better too.   She is currently on 2 L [place] which is an improvement from 3 L [place].  Objective: Vitals:   11/19/24 1946 11/20/24 0557 11/20/24 1100 11/20/24 1300  BP:   99/65  Pulse:  (!) 43 (!) 52   Resp:  20 16   Temp:   99.7 F (37.6 C) 98 F (36.7 C)  TempSrc:   Oral Oral  SpO2: 96% 90% 94% 94%  Weight:      Height:        Intake/Output Summary (Last 24 hours) at 11/20/2024 1449 Last data filed at 11/19/2024 1946 Gross per 24 hour  Intake 1774 ml  Output --  Net 1774 ml   Filed Weights   11/17/24 0708  Weight: 73.9 kg   Examination was completed with the assistance of: Steffan Daring, paramedic, who was present in the house during the virtual encounter:  General exam: Appears calm and comfortable  Respiratory system: Clear to auscultation. Respiratory effort normal. Cardiovascular system: S1 & S2 heard, RRR. No murmurs. No pedal edema. Gastrointestinal system: Abdomen is nondistended, soft and nontender. No organomegaly or masses felt. Normal bowel sounds heard. Central nervous system: Alert  and oriented. No focal neurological deficits. Extremities: Symmetric 5 x 5 power. Skin: No rashes, lesions or ulcers Psychiatry: Judgement and insight appear normal. Mood & affect appropriate.   Data Reviewed: I have personally reviewed following labs and imaging studies  CBC: Recent Labs  Lab 11/16/24 1411 11/17/24 1327 11/18/24 0421 11/19/24 0936  WBC 12.5* 7.6 9.7 11.1*  HGB 11.9* 10.6* 9.8* 10.5*  HCT 35.6* 32.7* 29.3* 32.5*  MCV 92.2 94.0 93.9 94.5  PLT 307 252 254 303   Basic Metabolic Panel: Recent Labs  Lab 11/16/24 1411 11/17/24 1327 11/18/24 0421 11/19/24 0936 11/20/24 0740  NA 138 137 139 141 140  K 3.3* 3.9 4.7 3.3* 4.1  CL 101 105 108 107 104  CO2 22 22 24 24 25   GLUCOSE 129* 130* 144* 81 80  BUN 11 9 12 13 11   CREATININE 0.62 0.46 0.52 0.60 0.57  CALCIUM  9.4 8.3* 8.7* 8.6* 9.0  MG  --  2.4  --   --   --    GFR: Estimated Creatinine Clearance: 64 mL/min (by C-G formula based on SCr of 0.57 mg/dL).  Liver Function Tests: Recent Labs  Lab 11/16/24 1411 11/17/24 1327  AST 18 14*  ALT 15 13  ALKPHOS 97 86  BILITOT 0.6 0.4  PROT 6.8 6.1*  ALBUMIN 3.9 3.5   Coagulation Profile: Recent Labs  Lab 11/16/24 1621  INR 1.0   Sepsis Labs: Recent Labs  Lab 11/16/24 1621 11/16/24 1821  LATICACIDVEN 2.2* 1.4   Recent Results (from the past 240 hours)  Blood Culture (routine x 2)     Status: None (Preliminary result)   Collection Time: 11/16/24  4:21 PM   Specimen: BLOOD  Result Value Ref Range Status   Specimen Description   Final    BLOOD LEFT ANTECUBITAL Performed at [facility], [address] [place], [place] [address]    Special Requests   Final    BOTTLES DRAWN AEROBIC AND ANAEROBIC Blood Culture adequate volume Performed at [facility], [address] [place], [place] [address]    Culture   Final    NO GROWTH 4 DAYS Performed at [facility], [address], [place], [place]  [address]    Report Status PENDING  Incomplete  Blood Culture (routine x 2)     Status: None (Preliminary result)   Collection Time: 11/16/24  4:26 PM   Specimen: BLOOD  Result Value Ref Range Status   Specimen Description   Final    BLOOD RIGHT  ANTECUBITAL Performed at [facility], [address] [place], [place] [address]    Special Requests   Final    BOTTLES DRAWN AEROBIC AND ANAEROBIC Blood Culture adequate volume Performed at [facility], [address] [place], [place] [address]    Culture   Final    NO GROWTH 4 DAYS Performed at [facility], [address], [place], [place] [address]    Report Status PENDING  Incomplete  Resp panel by RT-PCR (RSV, Flu A&B, Covid) Anterior Nasal Swab     Status: None   Collection Time: 11/16/24  4:36 PM   Specimen: Anterior Nasal Swab  Result Value Ref Range Status   SARS Coronavirus 2 by RT PCR NEGATIVE NEGATIVE Final    Comment: (NOTE) SARS-CoV-2 target nucleic acids are NOT DETECTED.  The SARS-CoV-2 RNA is generally detectable in upper respiratory specimens during the acute phase of infection. The lowest concentration of SARS-CoV-2 viral copies this assay can detect is 138 copies/mL. A negative result does not preclude SARS-Cov-2 infection and should not be used as the sole basis for treatment or other patient management decisions. A negative result may occur with  improper specimen collection/handling, submission of specimen other than nasopharyngeal swab, presence of viral mutation(s) within the areas targeted by this assay, and inadequate number of viral copies(<138 copies/mL). A negative result must be combined with clinical observations, patient history, and epidemiological information. The expected result is Negative.  Fact Sheet for Patients:  bloggercourse.com  Fact Sheet for Healthcare Providers:  seriousbroker.it  This  test is no t yet approved or cleared by the [place]  FDA and  has been authorized for detection and/or diagnosis of SARS-CoV-2 by FDA under an Emergency Use Authorization (EUA). This EUA will remain  in effect (meaning this test can be used) for the duration of the COVID-19 declaration under Section 564(b)(1) of the Act, 21 U.S.C.section 360bbb-3(b)(1), unless the authorization is terminated  or revoked sooner.       Influenza A by PCR NEGATIVE NEGATIVE Final   Influenza B by PCR NEGATIVE NEGATIVE Final    Comment: (NOTE) The Xpert Xpress SARS-CoV-2/FLU/RSV plus assay is intended as an aid in the diagnosis of influenza from Nasopharyngeal swab specimens and should not be used as a sole basis for treatment. Nasal washings and aspirates are unacceptable for Xpert Xpress SARS-CoV-2/FLU/RSV testing.  Fact Sheet for Patients: bloggercourse.com  Fact Sheet for Healthcare Providers: seriousbroker.it  This test is not yet approved or cleared by the [place]  FDA and has been authorized for detection and/or diagnosis of SARS-CoV-2 by FDA under an Emergency Use Authorization (EUA). This EUA will remain in effect (meaning this test can be used) for the duration of the COVID-19 declaration under Section 564(b)(1) of the Act, 21 U.S.C. section 360bbb-3(b)(1), unless the authorization is terminated or revoked.     Resp Syncytial Virus by PCR NEGATIVE NEGATIVE Final    Comment: (NOTE) Fact Sheet for Patients: bloggercourse.com  Fact Sheet for Healthcare Providers: seriousbroker.it  This test is not yet approved or cleared by the [place]  FDA and has been authorized for detection and/or diagnosis of SARS-CoV-2 by FDA under an Emergency Use Authorization (EUA). This EUA will remain in effect (meaning this test can be used) for the duration of the COVID-19 declaration under  Section 564(b)(1) of the Act, 21 U.S.C. section 360bbb-3(b)(1), unless the authorization is terminated or revoked.  Performed at [facility], [address] [place], [place] [address]  Respiratory (~20 pathogens) panel by PCR     Status: Abnormal   Collection Time: 11/18/24  7:07 AM   Specimen: Nasopharyngeal Swab; Respiratory  Result Value Ref Range Status   Adenovirus NOT DETECTED NOT DETECTED Final   Coronavirus 229E NOT DETECTED NOT DETECTED Final    Comment: (NOTE) The Coronavirus on the Respiratory Panel, DOES NOT test for the novel  Coronavirus (2019 nCoV)    Coronavirus HKU1 NOT DETECTED NOT DETECTED Final   Coronavirus NL63 NOT DETECTED NOT DETECTED Final   Coronavirus OC43 NOT DETECTED NOT DETECTED Final   Metapneumovirus DETECTED (A) NOT DETECTED Final   Rhinovirus / Enterovirus NOT DETECTED NOT DETECTED Final   Influenza A NOT DETECTED NOT DETECTED Final   Influenza B NOT DETECTED NOT DETECTED Final   Parainfluenza Virus 1 NOT DETECTED NOT DETECTED Final   Parainfluenza Virus 2 NOT DETECTED NOT DETECTED Final   Parainfluenza Virus 3 NOT DETECTED NOT DETECTED Final   Parainfluenza Virus 4 NOT DETECTED NOT DETECTED Final   Respiratory Syncytial Virus NOT DETECTED NOT DETECTED Final   Bordetella pertussis NOT DETECTED NOT DETECTED Final   Bordetella Parapertussis NOT DETECTED NOT DETECTED Final   Chlamydophila pneumoniae NOT DETECTED NOT DETECTED Final   Mycoplasma pneumoniae NOT DETECTED NOT DETECTED Final    Comment: Performed at [facility], [address] [place], [place] [address]    Scheduled Meds:  arformoterol   15 mcg Nebulization BID   ascorbic acid   250 mg Oral Daily   atorvastatin   20 mg Oral Daily   azithromycin   500 mg Oral Daily   budesonide  (PULMICORT ) nebulizer solution  0.25 mg Nebulization BID   calcium -vitamin D   1 tablet Oral Daily   citalopram   30 mg Oral Daily   famotidine   20 mg Oral Daily   ferrous gluconate    324 mg Oral Q breakfast   fluticasone   2 spray Each Nare Daily   loratadine   10 mg Oral Daily   montelukast   10 mg Oral QHS   multivitamin with minerals  1 tablet Oral Daily   pantoprazole   40 mg Oral Daily   [START ON 11/21/2024] predniSONE   20 mg Oral Q breakfast   Continuous Infusions:  cefTRIAXone  (ROCEPHIN )  IV 2 g (11/20/24 1225)    LOS: 3 days   Time spent: 52 minutes  Dr. Sherre Location: [place]  [facility] If 7PM-7AM, please contact night-coverage 11/20/2024, 2:49 PM   "

## 2024-11-21 DIAGNOSIS — R001 Bradycardia, unspecified: Secondary | ICD-10-CM | POA: Insufficient documentation

## 2024-11-21 LAB — CULTURE, BLOOD (ROUTINE X 2)
Culture: NO GROWTH
Culture: NO GROWTH
Special Requests: ADEQUATE
Special Requests: ADEQUATE

## 2024-11-21 MED ORDER — BUDESONIDE 0.25 MG/2ML IN SUSP
0.2500 mg | Freq: Two times a day (BID) | RESPIRATORY_TRACT | Status: DC
  Administered 2024-11-21 – 2024-11-22 (×2): 0.25 mg via RESPIRATORY_TRACT
  Filled 2024-11-21 (×4): qty 2

## 2024-11-21 MED ORDER — ALBUTEROL SULFATE HFA 108 (90 BASE) MCG/ACT IN AERS
2.0000 | INHALATION_SPRAY | RESPIRATORY_TRACT | Status: DC | PRN
  Filled 2024-11-21: qty 6.7

## 2024-11-21 MED ORDER — IPRATROPIUM BROMIDE 0.02 % IN SOLN
0.5000 mg | RESPIRATORY_TRACT | Status: DC | PRN
  Filled 2024-11-21: qty 2.5

## 2024-11-21 NOTE — Assessment & Plan Note (Signed)
 Outpatient pulm note 12/07/23, patient had HR of 54

## 2024-11-21 NOTE — Progress Notes (Signed)
 Am call for am meds patient expressed wanting to wait for medics to take meds educated on the benefits of spacing out medications and not taking all at once amd why we have a staggard schedule, patient agrred to self administer 8am medications. After patient identification completed patient self-administered prednisone  20mg , & Fergon 324,g. Patient also completed budesonide  and brovana  neb Tx separately as [facility] state that efficacy and safety have not been established in mixing these treatments. Well tolerated and mouth rinsed after. Notified Ms [place] medics will be heading to her around 9a.

## 2024-11-21 NOTE — Progress Notes (Signed)
 Afternoon IDR This evening continuous pulse oximetry ordered to confirm bedtime need for O2. Provider also ordered a stool sample to check if diarrhea is infectious. Possible D/C tomorrow.

## 2024-11-21 NOTE — Plan of Care (Signed)
" °  Problem: Education: Goal: Knowledge of General Education information will improve Description: Including pain rating scale, medication(s)/side effects and non-pharmacologic comfort measures Outcome: Progressing   Problem: Clinical Measurements: Goal: Ability to maintain clinical measurements within normal limits will improve Outcome: Progressing   Problem: Clinical Measurements: Goal: Will remain free from infection Outcome: Progressing   Problem: Clinical Measurements: Goal: Diagnostic test results will improve Outcome: Progressing   Problem: Clinical Measurements: Goal: Respiratory complications will improve Outcome: Progressing   Problem: Clinical Measurements: Goal: Cardiovascular complication will be avoided Outcome: Progressing   Problem: Nutrition: Goal: Adequate nutrition will be maintained Outcome: Progressing   Problem: Elimination: Goal: Will not experience complications related to bowel motility Outcome: Progressing Goal: Will not experience complications related to urinary retention Outcome: Progressing   Problem: Pain Managment: Goal: General experience of comfort will improve and/or be controlled Outcome: Progressing   Problem: Safety: Goal: Ability to remain free from injury will improve Outcome: Progressing   Problem: Respiratory: Goal: Ability to maintain adequate ventilation will improve Outcome: Progressing Goal: Ability to maintain a clear airway will improve Outcome: Progressing   "

## 2024-11-21 NOTE — Progress Notes (Signed)
 The pt was seen for her second hospital at home visit. The pt was alert and oriented x4. Her skin was warm,dry and appropriate in color. She had a strong radial and slow radial pulse and was breathing normally.  A new IV was started on the pt's right wrist due to prior IV not working, same is documented as noted. Due to IV placement the pt's wrist band needed to be cut; same is taped to the top of her medication box. Vitals were taken and are as noted. The pt was asked if her heart rate ever runs low and she stated yes that her heart is anywhere from the high 40s-60s and that is her normal. The pt denied any chest pain, SHOB, dizziness, headaches or N/V.  The pt was assisted in her nighttime breathing treatment and exercises. The pt's lungs sounds were ausculted and noted to be clear in the upper lobes but diminished in the lower lobes particularly the lower left lobe.  The virtual nurse was called and the pt was assisted in her nighttime medications. The pt did state that she has not taken singular since November due to not really noticing a difference when talking it. The virtual nurse was told same and the pt did not take her night time does of the medication.  The pt was asked if there was anything else that could be done for her and she denied same. The pt's daughter was shone where she could find some cough medication the case that her mom needed some tonight. She stated she will keep it in mind. At this time the pt's visit was complete.

## 2024-11-21 NOTE — Progress Notes (Signed)
 Phone call completed with patient. Introduced myself as the [facility]DRAISER for the night and reminded patient that the RN is available anytime overnight via phone or tablet call. Patient denies pain or discomfort at this time. Plan for the evening reviewed with patient which includes a EMT visit and overnight oxygen saturation study. Patient in agreement with plan. No other needs at this time.

## 2024-11-21 NOTE — Progress Notes (Signed)
 Arrived to find the PT awake sitting upright in bed. No obvious distress or trauma noted. PT is Aox4 and acting to her baseline. PT stated that she had slept well the night before and has been having a good morning. PT is c/o back pain and stated that it is from lying down for so long on her bed. PT rates it 3/10 on the pain scale. PT is on 2lpm of O2 via n/c.   Physical exam showed negative trauma to the head, neck, or face. PERRLA. Negative JVD or tracheal deviation. Chest expansion is equal with non-labored breathing. Lung sounds were c/e in all fields. PT denied any SOB or chest pressure. She does have a productive cough with mild mucus production. ABD is soft, non-tender. Bowel sounds are active. PT had 3 episodes of diarrhea this morning but denied any pain or blood in her stool. PT denied any dysuria, hematuria, or polyuria. Negative pedal or peripheral edema. PT c/o of back pain on the right side. Upon palpation there is tightness on her right lateral lower back. No trauma or traumatic injury causing it. She stated it is just from laying in bed.   Vitals were obtained and documented accordingly. PT was taken off of the O2 and maintained a saturation between 96-97% during the entirety of the visit. PT denied any SOB or difficulty breathing during this time and felt like she would be good without the O2 during the day. Medications were administered as prescribed. IV is patent, line was flushed post Rocephin  infusion and a new curos cap was placed.   Virtual visit with DO Cox was performed. She was told of findings and agreed with leaving the PT off of O2 during the day to see how she felt. Initially an ambulatory SPO2 test was requested but due to the PT maintaining her O2 saturation DO Cox stated that it wasn't necessary at this time.   IMM letter was signed due to EDD being 11/22/24. PT denied having any other questions or concerns and was reminded to call the virtual RN if she needed anything.

## 2024-11-21 NOTE — Progress Notes (Signed)
 AM virtual rounds with provider field staff and Tax Inspector. Patient reports feeling better field staff removed O2 as patient was not O2 dependent prior to admission. Provider aware to assess O2 need.  Provider discussed recent bouts of diarrhea suggesting to eat probiotic rich foods and possible culture of stool if does not resolve. Patient coached on maintaining adequate hydration. IMM given as D/C possibly 1/6.

## 2024-11-21 NOTE — Assessment & Plan Note (Signed)
 Secondary to demargination in setting of steroids

## 2024-11-21 NOTE — Progress Notes (Signed)
 Am IDR provider requests ambulation Sats for possible D/C DME of O2.

## 2024-11-21 NOTE — Progress Notes (Signed)
 Spoke to patient's daughter, patient had 3 bouts watery diarrhea and RLQ discomfort 2/10. Provider notified. Also requested to put up cats for visiting medic.

## 2024-11-21 NOTE — Progress Notes (Signed)
 Pt seen for routine [facility] evening visit. Pt appears well and is found semi-fowler's in bed. Pt states she feels okay but has been having mild back pain that is currently a 3/10. Pt states she feels it may just be stiff from being in bed and from tension. Tylenol  administered for pain.  Vital signs and assessment obtained as noted. Lung sounds clear in all fields, slightly diminished in lower fields. Heart tones are s1, s2 with strong regular radial pulses. No edema noted. Abdomen is soft and non-tender with active bowel sounds. Pt denies any more diarrhea today.  Ambulatory O2 sat qualification completed and results sent to Dr. Sherre and Darice, RN.   IV care completed including new curos cap.   Continuous pulse oximeter left at home for nocturnal O2 study. Pt informed another team member will be out this evening when she is ready to go to bed to plug it in and place pulse ox probe on her finger.   Pt encouraged to call with any problems or questions until then.

## 2024-11-21 NOTE — Progress Notes (Addendum)
 " PROGRESS NOTE - Telemedicine  Veronica Barrett  FMW:[id number] DOB: Aug 09, 1951 DOA: 11/16/2024 PCP: Gayle [name], PA-C   CC: SOB, fever HPI: Veronica Barrett is a 74 year old female with history of hyperlipidemia, chronic hemorrhoids, iron  deficiency anemia, GERD, asthma, not on oxygen.  11/16/2024: Patient presented to the ED for chief concerns of shortness of breath, cough with green sputum, and fever over the last 3 days.  12/31: [facility] admitted [facility].  Pending bed availability.  At the time of my evaluation, patient had temperature of 98.5, respiration rate 12, heart rate 60, blood pressure 101/51, SpO2 94% on 3 L nasal cannula.  Serum sodium is 138, potassium 3.3, chloride 101, bicarb 22, BUN of 11, serum creatinine 0.62, eGFR greater than 60, nonfasting blood glucose 129, WBC 12.5, hemoglobin 11.9, platelets of 307.  COVID/influenza A/influenza B/RSV PCR were negative.  Lactic acid was initially 2.2 and on repeat was 1.4.  UA showed trace leukocytes and negative for nitrites.  ED treatment: LR 1.5 L bolus, Solu-Medrol  to 50 mg IV one-time dose, magnesium  2 g IV one-time dose, azithromycin  500 mg IV, ceftriaxone  2 g IV one-time dose. Per EDP, patient desated to 88% on RA and was placed on 3 L [place] with appropriate improvement.  11/17/2024: I assumed care of the patient.  Virtual admission completed.  Admission Labs: Covid/flu/rsv negative Na 138, K 3.3, CO2 of 22, BUN 11, scr 0.62, glu 129 WBC 12.5, HgB 11.9, plt 307 T. Prot 6.8, AST 18, ALT 15, alk phos 98, t. Bili 0.6 Lactic acid 2.2 UA negative nitrite, trace LE  Admission Imaging Studies: CXR Airspace opacity at the left lung base, predominantly platelike, favoring atelectasis; retrocardiac pneumonia cannot be completely excluded. CTPA  No evidence of pulmonary embolism. 2. Multifocal pneumonia with dense consolidation throughout the left lower lobe. 3. Mildly enlarged bilateral hilar lymph nodes, each  measuring 11 mm, likely reactive.  Significant Labs: RVP Positive for Metapneumovirus  Antibiotic Therapy: Anti-infectives (From admission, onward)    Start     Dose/Rate Route Frequency Ordered Stop   11/17/24 1215  cefTRIAXone  (ROCEPHIN ) 2 g in sodium chloride  0.9 % 100 mL IVPB        2 g 200 mL/hr over 30 Minutes Intravenous Every 24 hours 11/17/24 1204 11/21/24 1214   11/17/24 1215  azithromycin  (ZITHROMAX ) 500 mg in sodium chloride  0.9 % 250 mL IVPB        500 mg 250 mL/hr over 60 Minutes Intravenous Every 24 hours 11/17/24 1204 11/21/24 1214   11/16/24 1745  cefTRIAXone  (ROCEPHIN ) 2 g in sodium chloride  0.9 % 100 mL IVPB        2 g 200 mL/hr over 30 Minutes Intravenous Once 11/16/24 1740 11/16/24 2001   11/16/24 1745  azithromycin  (ZITHROMAX ) 500 mg in sodium chloride  0.9 % 250 mL IVPB        500 mg 250 mL/hr over 60 Minutes Intravenous  Once 11/16/24 1740 11/16/24 1907     Significant Events: Admitted 11/16/2024 for sepsis due to pneumonia 11-18-2023 IV abx, IV steroids continued 11-19-2023 pt still requiring supplemental O2. RVP positive metapneumo virus.  Remains on O2 2-3 L/min. Still wheezing. 1/3: [facility] to [facility]. 1/4: I assumed care of pt. Day 5 of abx. O2 requirement changed from 3 L to 2 L. Prednisone  20 mg starting on 1/5, 2 doses ordered 1/5: day 6 of abx. Patient on 2 L  and spO2 in the high 90s. We  will do O2 challenge with ambulation and RT oxygen evaluation and order DME oxygen as needed. TOC consulted for DME. Update (18:28): DME for nebulizer placed.   Assessment & Plan:   Principal Problem:   Sepsis due to pneumonia [facility]) Active Problems:   Acute respiratory failure with hypoxia (HCC)   Multifocal pneumonia   Cough   Moderate persistent asthma   Hypokalemia   Normocytic anemia   Depression, recurrent   Hyperlipidemia   Leukocytosis   GERD (gastroesophageal reflux disease)   Hemorrhoids   Asymptomatic  bradycardia   Assessment and Plan:  * Sepsis due to pneumonia (HCC) 11-17-2024 - Acute hypoxic respiratory failure Patient meets severe sepsis criteria with elevated lactic acid of 2.2 initially, WBC elevation at 12.5, source of infection is multilobar pneumonia with left lower lobe being the worst Continue with ceftriaxone  2 g IV daily, azithromycin  500 mg IV daily to complete a 5-day course Continue with LR infusion at 125 mL/h Patient is status post 1.5 mL liter bolus Maintain MAP > 65 Continue O2 supplementation to maintain spO2 > 92%  11/20/24: Blood cultures, day 4 NGTD  Multifocal pneumonia 11-17-2024 CAP Continue with azithromycin  500 mg IV daily, ceftriaxone  2 g IV daily to complete a 5-day course DuoNebs every 6 hours Albuterol  nebulizer every 4 hours as needed for wheezing and shortness of breath, flutter, IS  11/19/2024 continue with IV rocephin  2 gram daily. Change to po zithromax . Continue with neb treatment. Start scheduled nebs at 9 am and 4 pm. Continue with prn nebs.  Continue tussionex to help with cough that is keeping her awake at night  Acute respiratory failure with hypoxia (HCC) 11/19/2024 RA sats noted to be 88% on 11-16-2024 while she was still in ER.  Pt remains on 2-3 L/min O2. Pt is not on home O2. Wean as tolerated.  Normocytic anemia 11/19/2024 admission HgB of 11.9 g/dl. Steadily decline. Today HgB 9.8 g/dl. No reported bleeding. May be due to viral illness. Check Fe, TIBC, % sat in AM.  Hypokalemia 11/17/2024 Potassium chloride  40 mEq p.o. one-time dose Recheck BMP in the AM Check serum magnesium  level on admission 1/4: resolved; s potassium is 4.1  Moderate persistent asthma 11-17-2024 With acute exacerbation Home long-acting inhaler equivalent resumed DuoNebs qid, 3 doses scheduled on admission Albuterol  nebulizer every 4 hours as needed for wheezing and shortness of breath grams to at bedtime milligrams daily resumed 11/19/2024 Start scheduled nebs at 9  am and 4 pm. Continue with prn nebs.   Cough Continue Guaifenesin  5 ml every 6 hours as needed to loosen phlegm and cough during the day. Tussionex 5 mL nightly as needed for coughing at night  Hemorrhoids 11/19/2024 continue prn hydrocortisone  25 mg suppository anal itching, hemorrhoids  GERD (gastroesophageal reflux disease) Continue with pepcid  and protonix ., pt has severe reflux.  Leukocytosis Secondary to demargination in setting of steroids  Hyperlipidemia Continue atorvastatin  20 mg daily  Depression, recurrent 11/19/2024 continue citalopram  30 mg daily  Asymptomatic bradycardia Outpatient pulm note 12/07/23, patient had HR of 54  DVT prophylaxis: ambulation as tolerated  Code Status: full code Family Communication: family welcomed at bedside Disposition Plan: anticipate discharge on 11/22/24 Level of care: [facility]  Consultants:  TOC, pt,ot  Antimicrobials: 11/21/24: day 6 of abx   Subjective:  At bedside, patient was able to tell me her first and last name, dob. She slept well last night. She reports 3 episodes of watery bowel movements. She states there were no  blood. She denies fever, abdominal pain, dysuria, hematuria.  Update: patient was napping and RN noted that patient had hypoxia with desaturation to 84% on RA. When she wakes, her O2 improves to 92-96%.  Objective: Vitals:   11/21/24 0300 11/21/24 1010 11/21/24 1200 11/21/24 1600  BP:  (!) 112/53  120/67  Pulse: (!) 44 (!) 58  (!) 59  Resp: 18 16  18   Temp:  97.7 F (36.5 C)  98.1 F (36.7 C)  TempSrc:  Oral  Oral  SpO2: 95% 96% (!) 89% 94%  Weight:      Height:  5' 5 (1.651 m)     No intake or output data in the 24 hours ending 11/21/24 1829 Filed Weights   11/17/24 0708  Weight: 73.9 kg   Examination was completed with the assistance of: Victoria Huffman, paramedic, who was present in the house during the virtual encounter:  General exam: Appears calm and comfortable   Respiratory system: Diminished lung sounds in the lower lobes, clear to auscultation in upper lobes. Respiratory effort normal. Cardiovascular system: S1 & S2 heard, RRR. No murmurs. No pedal edema. Gastrointestinal system: Abdomen is nondistended, soft and nontender. Normal bowel sounds heard. Central nervous system: Alert and oriented. No focal neurological deficits. Extremities: Symmetric 5 x 5 power. Skin: No rashes, lesions or ulcers Psychiatry: Judgement and insight appear normal. Mood & affect appropriate.   Data Reviewed: I have personally reviewed following labs and imaging studies  CBC: Recent Labs  Lab 11/16/24 1411 11/17/24 1327 11/18/24 0421 11/19/24 0936  WBC 12.5* 7.6 9.7 11.1*  HGB 11.9* 10.6* 9.8* 10.5*  HCT 35.6* 32.7* 29.3* 32.5*  MCV 92.2 94.0 93.9 94.5  PLT 307 252 254 303   Basic Metabolic Panel: Recent Labs  Lab 11/16/24 1411 11/17/24 1327 11/18/24 0421 11/19/24 0936 11/20/24 0740  NA 138 137 139 141 140  K 3.3* 3.9 4.7 3.3* 4.1  CL 101 105 108 107 104  CO2 22 22 24 24 25   GLUCOSE 129* 130* 144* 81 80  BUN 11 9 12 13 11   CREATININE 0.62 0.46 0.52 0.60 0.57  CALCIUM  9.4 8.3* 8.7* 8.6* 9.0  MG  --  2.4  --   --   --    GFR: Estimated Creatinine Clearance: 64 mL/min (by C-G formula based on SCr of 0.57 mg/dL).  Liver Function Tests: Recent Labs  Lab 11/16/24 1411 11/17/24 1327  AST 18 14*  ALT 15 13  ALKPHOS 97 86  BILITOT 0.6 0.4  PROT 6.8 6.1*  ALBUMIN 3.9 3.5   Coagulation Profile: Recent Labs  Lab 11/16/24 1621  INR 1.0   Sepsis Labs: Recent Labs  Lab 11/16/24 1621 11/16/24 1821  LATICACIDVEN 2.2* 1.4   Recent Results (from the past 240 hours)  Blood Culture (routine x 2)     Status: None   Collection Time: 11/16/24  4:21 PM   Specimen: BLOOD  Result Value Ref Range Status   Specimen Description   Final    BLOOD LEFT ANTECUBITAL Performed at [facility], [address], [place], [place]  [address]    Special Requests   Final    BOTTLES DRAWN AEROBIC AND ANAEROBIC Blood Culture adequate volume Performed at [facility], [address] [place], [place] [address]    Culture   Final    NO GROWTH 5 DAYS Performed at [facility], [address] [place], [place] [address]    Report Status 11/21/2024 FINAL  Final  Blood Culture (routine x 2)     Status: None   Collection Time: 11/16/24  4:26 PM   Specimen: BLOOD  Result Value Ref Range Status   Specimen Description   Final    BLOOD RIGHT ANTECUBITAL Performed at [facility], [address], [place], [place] [address]    Special Requests   Final    BOTTLES DRAWN AEROBIC AND ANAEROBIC Blood Culture adequate volume Performed at [facility], [address] [place], [place] [address]    Culture   Final    NO GROWTH 5 DAYS Performed at [facility], [address], [place], [place] [address]    Report Status 11/21/2024 FINAL  Final  Resp panel by RT-PCR (RSV, Flu A&B, Covid) Anterior Nasal Swab     Status: None   Collection Time: 11/16/24  4:36 PM   Specimen: Anterior Nasal Swab  Result Value Ref Range Status   SARS Coronavirus 2 by RT PCR NEGATIVE NEGATIVE Final    Comment: (NOTE) SARS-CoV-2 target nucleic acids are NOT DETECTED.  The SARS-CoV-2 RNA is generally detectable in upper respiratory specimens during the acute phase of infection. The lowest concentration of SARS-CoV-2 viral copies this assay can detect is 138 copies/mL. A negative result does not preclude SARS-Cov-2 infection and should not be used as the sole basis for treatment or other patient management decisions. A negative result may occur with  improper specimen collection/handling, submission of specimen other than nasopharyngeal swab, presence of viral mutation(s) within the areas targeted by this assay, and inadequate number of viral copies(<138 copies/mL). A negative result  must be combined with clinical observations, patient history, and epidemiological information. The expected result is Negative.  Fact Sheet for Patients:  bloggercourse.com  Fact Sheet for Healthcare Providers:  seriousbroker.it  This test is no t yet approved or cleared by the [place]  FDA and  has been authorized for detection and/or diagnosis of SARS-CoV-2 by FDA under an Emergency Use Authorization (EUA). This EUA will remain  in effect (meaning this test can be used) for the duration of the COVID-19 declaration under Section 564(b)(1) of the Act, 21 U.S.C.section 360bbb-3(b)(1), unless the authorization is terminated  or revoked sooner.       Influenza A by PCR NEGATIVE NEGATIVE Final   Influenza B by PCR NEGATIVE NEGATIVE Final    Comment: (NOTE) The Xpert Xpress SARS-CoV-2/FLU/RSV plus assay is intended as an aid in the diagnosis of influenza from Nasopharyngeal swab specimens and should not be used as a sole basis for treatment. Nasal washings and aspirates are unacceptable for Xpert Xpress SARS-CoV-2/FLU/RSV testing.  Fact Sheet for Patients: bloggercourse.com  Fact Sheet for Healthcare Providers: seriousbroker.it  This test is not yet approved or cleared by the [place]  FDA and has been authorized for detection and/or diagnosis of SARS-CoV-2 by FDA under an Emergency Use Authorization (EUA). This EUA will remain in effect (meaning this test can be used) for the duration of the COVID-19 declaration under Section 564(b)(1) of the Act, 21 U.S.C. section 360bbb-3(b)(1), unless the authorization is terminated or revoked.     Resp Syncytial Virus by PCR NEGATIVE NEGATIVE Final    Comment: (NOTE) Fact Sheet for Patients: bloggercourse.com  Fact Sheet for Healthcare Providers: seriousbroker.it  This test is  not yet approved or cleared by the [place]  FDA and has been authorized for detection and/or diagnosis of SARS-CoV-2 by FDA under an Emergency Use Authorization (EUA). This EUA will remain in effect (meaning this  test can be used) for the duration of the COVID-19 declaration under Section 564(b)(1) of the Act, 21 U.S.C. section 360bbb-3(b)(1), unless the authorization is terminated or revoked.  Performed at [facility], [address] [place], [place] [address]   Respiratory (~20 pathogens) panel by PCR     Status: Abnormal   Collection Time: 11/18/24  7:07 AM   Specimen: Nasopharyngeal Swab; Respiratory  Result Value Ref Range Status   Adenovirus NOT DETECTED NOT DETECTED Final   Coronavirus 229E NOT DETECTED NOT DETECTED Final    Comment: (NOTE) The Coronavirus on the Respiratory Panel, DOES NOT test for the novel  Coronavirus (2019 nCoV)    Coronavirus HKU1 NOT DETECTED NOT DETECTED Final   Coronavirus NL63 NOT DETECTED NOT DETECTED Final   Coronavirus OC43 NOT DETECTED NOT DETECTED Final   Metapneumovirus DETECTED (A) NOT DETECTED Final   Rhinovirus / Enterovirus NOT DETECTED NOT DETECTED Final   Influenza A NOT DETECTED NOT DETECTED Final   Influenza B NOT DETECTED NOT DETECTED Final   Parainfluenza Virus 1 NOT DETECTED NOT DETECTED Final   Parainfluenza Virus 2 NOT DETECTED NOT DETECTED Final   Parainfluenza Virus 3 NOT DETECTED NOT DETECTED Final   Parainfluenza Virus 4 NOT DETECTED NOT DETECTED Final   Respiratory Syncytial Virus NOT DETECTED NOT DETECTED Final   Bordetella pertussis NOT DETECTED NOT DETECTED Final   Bordetella Parapertussis NOT DETECTED NOT DETECTED Final   Chlamydophila pneumoniae NOT DETECTED NOT DETECTED Final   Mycoplasma pneumoniae NOT DETECTED NOT DETECTED Final    Comment: Performed at [facility], [address] [place], [place] [address]    Scheduled Meds:  arformoterol   15 mcg Nebulization BID   ascorbic  acid  250 mg Oral Daily   atorvastatin   20 mg Oral Daily   azithromycin   500 mg Oral Daily   budesonide  (PULMICORT ) nebulizer solution  0.25 mg Nebulization BID   calcium -vitamin D   1 tablet Oral Daily   citalopram   30 mg Oral Daily   famotidine   20 mg Oral Daily   ferrous gluconate   324 mg Oral Q breakfast   fluticasone   2 spray Each Nare Daily   loratadine   10 mg Oral Daily   montelukast   10 mg Oral QHS   multivitamin with minerals  1 tablet Oral Daily   pantoprazole   40 mg Oral Daily   predniSONE   20 mg Oral Q breakfast   Continuous Infusions:  cefTRIAXone  (ROCEPHIN )  IV Stopped (11/21/24 1100)    LOS: 4 days   Time spent: 52 minutes  Dr. Sherre Location: [place]  [facility] If 7PM-7AM, please contact night-coverage 11/21/2024, 6:29 PM   "

## 2024-11-21 NOTE — Plan of Care (Signed)
" °  Problem: Education: Goal: Knowledge of General Education information will improve Description: Including pain rating scale, medication(s)/side effects and non-pharmacologic comfort measures Outcome: Progressing   Problem: Health Behavior/Discharge Planning: Goal: Ability to manage health-related needs will improve Outcome: Progressing   Problem: Clinical Measurements: Goal: Ability to maintain clinical measurements within normal limits will improve Outcome: Progressing Goal: Respiratory complications will improve Outcome: Progressing   Problem: Nutrition: Goal: Adequate nutrition will be maintained Outcome: Progressing   Problem: Coping: Goal: Level of anxiety will decrease Outcome: Progressing   Problem: Pain Managment: Goal: General experience of comfort will improve and/or be controlled Outcome: Progressing   Problem: Activity: Goal: Ability to tolerate increased activity will improve Outcome: Progressing   Problem: Respiratory: Goal: Ability to maintain adequate ventilation will improve Outcome: Progressing Goal: Ability to maintain a clear airway will improve Outcome: Progressing   "

## 2024-11-21 NOTE — Progress Notes (Signed)
 IMM letter signed 11/21/24 @ 1020

## 2024-11-21 NOTE — Plan of Care (Signed)
 Patient feels improving and feeling much better. Patient will be on continuous pulse oximetry to validate needs of supplemental O2.  Problem: Education: Goal: Knowledge of General Education information will improve Description: Including pain rating scale, medication(s)/side effects and non-pharmacologic comfort measures Outcome: Progressing   Problem: Clinical Measurements: Goal: Ability to maintain clinical measurements within normal limits will improve Outcome: Progressing Goal: Diagnostic test results will improve Outcome: Progressing Goal: Respiratory complications will improve Outcome: Progressing   Problem: Activity: Goal: Risk for activity intolerance will decrease Outcome: Progressing   Problem: Nutrition: Goal: Adequate nutrition will be maintained Outcome: Progressing   Problem: Elimination: Goal: Will not experience complications related to bowel motility Outcome: Progressing   Problem: Respiratory: Goal: Ability to maintain adequate ventilation will improve Outcome: Progressing

## 2024-11-21 NOTE — Progress Notes (Signed)
 Had video visit with patient for evening medication administration with Paramedic J. Montalvo in the home.  Patient was found sitting in position of comfort on couch in no acute distress, just finished up with her breathing treatment and reported feeling better  No acute s/s of distress were noted or reported.  Pt was A&O x4, RR even and unlabored, Spo2 at 95% on 2LPM via [place].  Patient was noted to be bradycardic, which she has been all day and has a history of.  Patient denies, sob, chest pain, dizziness or any other pertinent symptoms associated with this. Pt denies pain at this time. Witnessed patient take evening medication without complications.  Updated patient on plan of care and all questions answered prior to departure from video call.

## 2024-11-21 NOTE — Progress Notes (Addendum)
 Current health alarm for SpO2 patient napping desatted to 84% called patient,  reports feeling fine but was sleeping. I explain what happened and flag on current health she is putting 2L O2 [place] to continuing resting provider notified on secure chat. Afternoon visit will check if patient desats during exertion as well. Now resting and O2 up at 94% on 2L O2

## 2024-11-21 NOTE — Progress Notes (Signed)
 SATURATION QUALIFICATIONS: (This note is used to comply with regulatory documentation for home oxygen)  Patient Saturations on Room Air at Rest = 92%  Patient Saturations on Room Air while Ambulating = 89% with signs of shortness of breath. Breathy shortened speech  Patient Saturations on 2 Liters of oxygen while Ambulating = 95% decrease in signs of shortness of breath, breathy speech improved  Please briefly explain why patient needs home oxygen:  Patients saturations consistently decrease on RA while active and while sleeping. During sleep patient has desatted to 84%.

## 2024-11-21 NOTE — Progress Notes (Signed)
 PT Cancellation Note  Patient Details Name: Veronica Barrett MRN: [id number] DOB: 04/02/1951   Cancelled Treatment:    Reason Eval/Treat Not Completed: Other (comment)  Visit scheduled for 415 but unable to reach pt for therapy follow-up. Will attempt again tomorrow. RN notified.  [name] Roads 11/21/2024, 4:30 PM

## 2024-11-22 MED ORDER — BUDESONIDE 0.25 MG/2ML IN SUSP: 0.2500 mg | Freq: Two times a day (BID) | RESPIRATORY_TRACT | 1 refills | Status: AC

## 2024-11-22 MED ORDER — ARFORMOTEROL TARTRATE 15 MCG/2ML IN NEBU: 15.0000 ug | INHALATION_SOLUTION | Freq: Two times a day (BID) | RESPIRATORY_TRACT | 0 refills | Status: AC

## 2024-11-22 MED ORDER — ONDANSETRON HCL 4 MG PO TABS: 4.0000 mg | ORAL_TABLET | Freq: Four times a day (QID) | ORAL | 0 refills | Status: AC | PRN

## 2024-11-22 NOTE — Progress Notes (Signed)
 PT Cancellation Note  Patient Details Name: Veronica Barrett MRN: [id number] DOB: 1951-11-01   Cancelled Treatment:    Reason Eval/Treat Not Completed: Other (comment)  Spoke with virtual nurse to schedule follow-up physical therapy visit. Reports pt discharging from program this evening but will speak with patient to see if they would be willing to meet virtually for follow-up. Will await decision, available until 5pm. Touch base again tomorrow if still admitted with [facility] .  Leontine Roads, PT, DPT [facility] Physical Therapist Office: [phone] Website: [email or web address]  [name] Roads 11/22/2024, 3:57 PM

## 2024-11-22 NOTE — Progress Notes (Signed)
 [facility]  medic out to see pt for first visit of the day. On arrival, I was met at the door by the patients daughter who welcomed me in. See flow sheet for assessment. Pt had no complaints today and states shes feeling much better . A video call with the virtual RN and provider was conducted and completed. Provider discussed discharge plan including meds with the patient. Meds were given with oversight from virtual RN. Pts IV was flushed and maintained. IV abx were given after video call at time due with [facility] verification. Respiratory device was collected from patients home to be evaluated by Respiratory on arrival to [facility]. Stool sample was collected for GI panel. IV was discontinued. No other tasks needed for this visit. The patient was left in care of family and herself. [facility]  visit complete.

## 2024-11-22 NOTE — TOC CM/SW Note (Addendum)
 Per [facility]  staff, patient will be discharging today. Spoke with [employer] rep at [phone]. O2 delivery will occur this evening.   Merilee Batty, MSN, RN Case Management [phone]   1840: Per [facility]  staff, O2 has been delivered. Neb machine did not arrive. Sent message to [employer] rep at [phone]. Awaiting response.   1950: Left vm for [employer] After Hours staff at [phone] requesting update on neb machine delivery. Awaiting return call.

## 2024-11-22 NOTE — Progress Notes (Signed)
 This EMT arrived at PT's home and was greeted at the door by Pt's daughter's wife. PT was found sitting on side of bed in bedroom. PT was alert x4, in no sign of distress and had no complaint of pain. PT was assisted with scheduled meds: Pulmicort , Brovana , and Celexa . Meds verified via RN Dawn. PT agreed and took all scheduled meds. PT was connected to Continuous pulse ox monitoring as ordered. PT was educated on equipment and had no questions. PT vitals were captured and documented in chart. PT stated she did not have to use the bathroom anytime soon and would try to provide a stool sample in the morning. Family has been informed of tonights plan and educated on equipment. PT ready for bed. No questions concerns from pt or pt's family at this time. PT informed on how to contact healthcare personnel.

## 2024-11-22 NOTE — Care Management (Signed)
 Patient is mobile in her home from a musculoskeletal standpoint. She can ambulate around her home with continued symptoms of shortness of breath requiring O2 supplementation. She does not require physical equipment to be mobile such as 3:1 walker, wheelchair.   Dr. Sherre

## 2024-11-22 NOTE — Discharge Summary (Signed)
 " Physician Discharge Summary   Patient: Veronica Barrett MRN: [id number] DOB: 06/27/1951  Admit date:     11/16/2024  Discharge date: 11/22/2024  Discharge Physician: Dr. Sherre   Telemedicine encounter: Patient was able to confirm her first and last name,, and date of birth..  PCP: Gayle [name], PA-C   Recommendations at discharge:   Please wear your oxygen with activity and especially at night. Please follow-up with your primary care doctor within 2 to 4 weeks of discharge. I recommend lab on primary care follow-up and possible referral for sleep study. Please follow-up with your pulmonologist as soon as possible, within 4 to 6 weeks of discharge.  Discharge Diagnoses:  Principal Problem:   Sepsis due to pneumonia [facility]) Active Problems:   Acute respiratory failure with hypoxia (HCC)   Multifocal pneumonia   Cough   Moderate persistent asthma   Hypokalemia   Normocytic anemia   Depression, recurrent   Hyperlipidemia   Leukocytosis   GERD (gastroesophageal reflux disease)   Hemorrhoids   Asymptomatic bradycardia  Resolved Problems:   * No resolved hospital problems. [facility] Course:  Veronica Barrett is a 74 year old female with history of hyperlipidemia, chronic hemorrhoids, iron  deficiency anemia, GERD, asthma, not on oxygen.  11/16/2024: Patient presented to the ED for chief concerns of shortness of breath, cough with green sputum, and fever over the last 3 days.  12/31: [facility] to [facility].  Pending bed availability.  At the time of my evaluation, patient had temperature of 98.5, respiration rate 12, heart rate 60, blood pressure 101/51, SpO2 94% on 3 L nasal cannula.  Serum sodium is 138, potassium 3.3, chloride 101, bicarb 22, BUN of 11, serum creatinine 0.62, eGFR greater than 60, nonfasting blood glucose 129, WBC 12.5, hemoglobin 11.9, platelets of 307.  COVID/influenza A/influenza B/RSV PCR were negative.  Lactic acid was initially 2.2  and on repeat was 1.4.  UA showed trace leukocytes and negative for nitrites.  ED treatment: LR 1.5 L bolus, Solu-Medrol  to 50 mg IV one-time dose, magnesium  2 g IV one-time dose, azithromycin  500 mg IV, ceftriaxone  2 g IV one-time dose. Per EDP, patient desated to 88% on RA and was placed on 3 L [place] with appropriate improvement.  11/17/2024: I assumed care of the patient.  Virtual admission completed.  Admission Labs: Covid/flu/rsv negative Na 138, K 3.3, CO2 of 22, BUN 11, scr 0.62, glu 129 WBC 12.5, HgB 11.9, plt 307 T. Prot 6.8, AST 18, ALT 15, alk phos 98, t. Bili 0.6 Lactic acid 2.2 UA negative nitrite, trace LE  Admission Imaging Studies: CXR Airspace opacity at the left lung base, predominantly platelike, favoring atelectasis; retrocardiac pneumonia cannot be completely excluded. CTPA  No evidence of pulmonary embolism. 2. Multifocal pneumonia with dense consolidation throughout the left lower lobe. 3. Mildly enlarged bilateral hilar lymph nodes, each measuring 11 mm, likely reactive.  Significant Labs: RVP Positive for Metapneumovirus  Antibiotic Therapy: Anti-infectives (From admission, onward)    Start     Dose/Rate Route Frequency Ordered Stop   11/17/24 1215  cefTRIAXone  (ROCEPHIN ) 2 g in sodium chloride  0.9 % 100 mL IVPB        2 g 200 mL/hr over 30 Minutes Intravenous Every 24 hours 11/17/24 1204 11/21/24 1214   11/17/24 1215  azithromycin  (ZITHROMAX ) 500 mg in sodium chloride  0.9 % 250 mL IVPB        500 mg 250 mL/hr over 60 Minutes Intravenous Every 24 hours  11/17/24 1204 11/21/24 1214   11/16/24 1745  cefTRIAXone  (ROCEPHIN ) 2 g in sodium chloride  0.9 % 100 mL IVPB        2 g 200 mL/hr over 30 Minutes Intravenous Once 11/16/24 1740 11/16/24 2001   11/16/24 1745  azithromycin  (ZITHROMAX ) 500 mg in sodium chloride  0.9 % 250 mL IVPB        500 mg 250 mL/hr over 60 Minutes Intravenous  Once 11/16/24 1740 11/16/24 1907     Significant Events: Admitted 11/16/2024 for  sepsis due to pneumonia 11-18-2023 IV abx, IV steroids continued 11-19-2023 pt still requiring supplemental O2. RVP positive metapneumo virus.  Remains on O2 2-3 L/min. Still wheezing. 1/3: [facility] to [facility]. 1/4: I assumed care of pt. Day 5 of abx. O2 requirement changed from 3 L to 2 L. Prednisone  20 mg starting on 1/5, 2 doses ordered 1/5: day 6 of abx. Patient on 2 L [place] and spO2 in the high 90s. We will do O2 challenge with ambulation and RT oxygen evaluation and order DME oxygen as needed. TOC consulted for DME. Update (18:28): DME for nebulizer placed.  DME for oxygen placed as patient desatted to 89% with activity. 1/6: Patient completed day 7 of abx.  Patient discharged from hospital at home service.  Assessment and Plan:  * Sepsis due to pneumonia (HCC) 11-17-2024 - Acute hypoxic respiratory failure Patient meets severe sepsis criteria with elevated lactic acid of 2.2 initially, WBC elevation at 12.5, source of infection is multilobar pneumonia with left lower lobe being the worst Continue with ceftriaxone  2 g IV daily, azithromycin  500 mg IV daily to complete a 5-day course Continue with LR infusion at 125 mL/h Patient is status post 1.5 mL liter bolus Maintain MAP > 65 Continue O2 supplementation to maintain spO2 > 92%  11/20/24: Blood cultures, day 4 NGTD  Multifocal pneumonia 11-17-2024 CAP Continue with azithromycin  500 mg IV daily, ceftriaxone  2 g IV daily to complete a 5-day course DuoNebs every 6 hours Albuterol  nebulizer every 4 hours as needed for wheezing and shortness of breath, flutter, IS  11/19/2024 continue with IV rocephin  2 gram daily. Change to po zithromax . Continue with neb treatment. Start scheduled nebs at 9 am and 4 pm. Continue with prn nebs.  Continue tussionex to help with cough that is keeping her awake at night.  1/6: Patient completed day 7 of antibiotic.  Patient still with acute hypoxic respiratory requiring oxygen with  activity and nightly.  DME had been ordered.  Acute respiratory failure with hypoxia (HCC) 11/19/2024 RA sats noted to be 88% on 11-16-2024 while she was still in ER.  Pt remains on 2-3 L/min O2. Pt is not on home O2. Wean as tolerated. 1/5: persistent with symptomatic shortness of desaturation of 89% on RA with ambulation. O2 DME ordered on admission. TOC consulted  Normocytic anemia 11/19/2024 admission HgB of 11.9 g/dl. Steadily decline. Today HgB 9.8 g/dl. No reported bleeding. May be due to viral illness. Check Fe, TIBC, % sat in AM.  Hypokalemia 11/17/2024 Potassium chloride  40 mEq p.o. one-time dose Recheck BMP in the AM Check serum magnesium  level on admission 1/4: resolved; s potassium is 4.1  Moderate persistent asthma 11-17-2024 With acute exacerbation Home long-acting inhaler equivalent resumed DuoNebs qid, 3 doses scheduled on admission Albuterol  nebulizer every 4 hours as needed for wheezing and shortness of breath grams to at bedtime milligrams daily resumed 11/19/2024 Start scheduled nebs at 9 am and 4 pm. Continue with  prn nebs.   Cough Continue Guaifenesin  5 ml every 6 hours as needed to loosen phlegm and cough during the day. Tussionex 5 mL nightly as needed for coughing at night  Hemorrhoids 11/19/2024 continue prn hydrocortisone  25 mg suppository anal itching, hemorrhoids  GERD (gastroesophageal reflux disease) Continue with pepcid  and protonix ., pt has severe reflux.  Leukocytosis Secondary to demargination in setting of steroids  Hyperlipidemia Continue atorvastatin  20 mg daily  Depression, recurrent 11/19/2024 continue citalopram  30 mg daily  Asymptomatic bradycardia Outpatient pulm note 12/07/23, patient had HR of 54      Pain control - [place]  Controlled Substance Reporting System database was reviewed. and patient was instructed, not to drive, operate heavy machinery, perform activities at heights, swimming or participation in water activities or  provide baby-sitting services while on Pain, Sleep and Anxiety Medications; until their outpatient Physician has advised to do so again. Also recommended to not to take more than prescribed Pain, Sleep and Anxiety Medications.  Consultants: PT, OT, TOC Procedures performed: None Disposition: Home Diet recommendation:  Cardiac diet DISCHARGE MEDICATION: Allergies as of 11/22/2024       Reactions   Povidone Iodine Dermatitis   Significant rash   Seasonal Ic [cholestatin]    Tape Rash   Wound Dressing Adhesive Dermatitis        Medication List     TAKE these medications    acetaminophen  500 MG tablet Commonly known as: TYLENOL  Take 1,000 mg by mouth 2 (two) times daily as needed for fever, headache, mild pain (pain score 1-3) or moderate pain (pain score 4-6).   albuterol  108 (90 Base) MCG/ACT inhaler Commonly known as: VENTOLIN  HFA Inhale 2 puffs into the lungs every 6 (six) hours as needed for wheezing or shortness of breath.   arformoterol  15 MCG/2ML Nebu Commonly known as: BROVANA  Take 2 mLs (15 mcg total) by nebulization 2 (two) times daily.   Aspirin  Low Dose 81 MG tablet Generic drug: aspirin  EC Take 81 mg by mouth daily.   atorvastatin  20 MG tablet Commonly known as: LIPITOR Take 1 tablet by mouth once daily What changed: when to take this   budesonide  0.25 MG/2ML nebulizer solution Commonly known as: PULMICORT  Take 2 mLs (0.25 mg total) by nebulization 2 (two) times daily.   budesonide -formoterol  160-4.5 MCG/ACT inhaler Commonly known as: SYMBICORT Inhale 2 puffs into the lungs See admin instructions. Inhale 2 puffs into the lungs twice daily during flares , otherwise inhale 2 puffs daily as needed for wheezing.   CALCIUM  + VITAMIN D3 PO Take 1 tablet by mouth daily.   cetirizine 10 MG tablet Commonly known as: ZYRTEC Take 10 mg by mouth daily.   citalopram  20 MG tablet Commonly known as: CELEXA  TAKE 1 & 1/2 (ONE & ONE-HALF) TABLETS BY MOUTH ONCE  DAILY What changed: See the new instructions.   FERROUS GLUCONATE  PO Take 1 tablet by mouth daily.   fluticasone  50 MCG/ACT nasal spray Commonly known as: FLONASE  Place 1-2 sprays into both nostrils daily as needed for rhinitis or allergies.   hydrocortisone  25 MG suppository Commonly known as: ANUSOL -HC Place 1 suppository (25 mg total) rectally 2 (two) times daily as needed for hemorrhoids or anal itching.   ibuprofen 200 MG tablet Commonly known as: ADVIL Take 400 mg by mouth 2 (two) times daily as needed for moderate pain (pain score 4-6) or headache.   Multivitamin Women 50+ Tabs Take 1 tablet by mouth daily.   ondansetron  4 MG tablet Commonly known as:  ZOFRAN  Take 1 tablet (4 mg total) by mouth every 6 (six) hours as needed for nausea or vomiting.   Pepcid  AC Maximum Strength 20 MG tablet Generic drug: famotidine  Take 20 mg by mouth daily.   VITAMIN C  PO Take 1 tablet by mouth daily.       ASK your doctor about these medications    chlorpheniramine-HYDROcodone  10-8 MG/5ML Commonly known as: TUSSIONEX Take 5 mLs by mouth every 12 (twelve) hours as needed for up to 2 days (cough at night). Ask about: Should I take this medication?               Durable Medical Equipment  (From admission, onward)           Start     Ordered   11/21/24 1827  For home use only DME Nebulizer machine  Once       Question Answer Comment  Patient needs a nebulizer to treat with the following condition Asthma exacerbation   Patient needs a nebulizer to treat with the following condition Shortness of breath   Length of Need 6 Months   Additional equipment included Administration kit   Additional equipment included Filter      11/21/24 1826   11/21/24 1728  For home use only DME oxygen  Once       Question Answer Comment  Length of Need 6 Months   Mode or (Route) Nasal cannula   Liters per Minute 2   Frequency Continuous (stationary and portable oxygen unit needed)    Oxygen delivery system: Gas   Oxygen delivery system: Portable concentrator (POC)   Oxygen delivery system: Gas/Tank   Oxygen delivery system: Concentrator      11/21/24 1727   11/21/24 1242  For home use only DME oxygen  Once       Comments: Pt oxygen desatted to 84% while asleep. Need o2 at bedtime.  Question Answer Comment  Length of Need 6 Months   Liters per Minute 2   Frequency Only at night (stationary unit needed)   Oxygen delivery system: Gas   Oxygen delivery system: Concentrator   Oxygen delivery system: Gas/Tank      11/21/24 1244           Physical exam was completed with assistance of Tyrell Lewis, paramedic, who was present in the home at the time of the virtual encounter.   Discharge Exam: Filed Weights   11/17/24 0708  Weight: 73.9 kg   Physical Exam Constitutional:      Appearance: She is well-developed.  HENT:     Head: Normocephalic and atraumatic.  Eyes:     Extraocular Movements: Extraocular movements intact.     Pupils: Pupils are equal, round, and reactive to light.  Cardiovascular:     Rate and Rhythm: Regular rhythm. Bradycardia present.     Heart sounds: Normal heart sounds. No murmur heard. Pulmonary:     Effort: Pulmonary effort is normal. No respiratory distress.     Breath sounds: Examination of the left-lower field reveals rhonchi. Rhonchi present. No decreased breath sounds, wheezing or rales.  Abdominal:     General: Bowel sounds are normal.     Palpations: Abdomen is soft.     Tenderness: There is no abdominal tenderness.  Musculoskeletal:        General: Normal range of motion.  Skin:    Coloration: Skin is not cyanotic or pale.  Neurological:     General: No focal deficit present.  Mental Status: She is alert and oriented to person, place, and time.  Psychiatric:        Mood and Affect: Mood normal.        Behavior: Behavior normal.   Condition at discharge: good  The results of significant diagnostics from this  hospitalization (including imaging, microbiology, ancillary and laboratory) are listed below for reference.   Imaging Studies: CT Angio Chest PE W/Cm &/Or Wo Cm Result Date: 11/16/2024 EXAM: CTA CHEST 11/16/2024 05:53:20 PM TECHNIQUE: CTA of the chest was performed without and with the administration of 75 mL of iohexol  (OMNIPAQUE ) 350 MG/ML injection. Multiplanar reformatted images are provided for review. MIP images are provided for review. Automated exposure control, iterative reconstruction, and/or weight based adjustment of the mA/kV was utilized to reduce the radiation dose to as low as reasonably achievable. COMPARISON: None available. CLINICAL HISTORY: Pulmonary embolism (PE) suspected, high prob. FINDINGS: PULMONARY ARTERIES: Pulmonary arteries are adequately opacified for evaluation. No acute pulmonary embolus. Main pulmonary artery is normal in caliber. MEDIASTINUM: The heart and pericardium demonstrate no acute abnormality. There is no acute abnormality of the thoracic aorta. LYMPH NODES: There is a mildly enlarged left hilar lymph node measuring 11 mm. Mildly enlarged right hilar lymph node also measures 11 mm. LUNGS AND PLEURA: There is airspace consolidation throughout the mid and lower aspect of the left lower lobe. There is additional patchy multifocal ground glass opacity in the bilateral upper lobes, right lower lobe and right middle lobe. There are bands of atelectasis in the right lower lobe. No evidence of pleural effusion or pneumothorax. UPPER ABDOMEN: Limited images of the upper abdomen are unremarkable. SOFT TISSUES AND BONES: No acute bone or soft tissue abnormality. IMPRESSION: 1. No evidence of pulmonary embolism. 2. Multifocal pneumonia with dense consolidation throughout the left lower lobe. 3. Mildly enlarged bilateral hilar lymph nodes, each measuring 11 mm, likely reactive. Electronically signed by: Greig Pique MD 11/16/2024 07:13 PM EST RP Workstation: [id number]   DG Chest  2 View Result Date: 11/16/2024 EXAM: 2 VIEW(S) XRAY OF THE CHEST 11/16/2024 12:34:15 PM COMPARISON: 08/10/2023 CLINICAL HISTORY: wheezing, decreased O2, decreased lung sounds in bases FINDINGS: LUNGS AND PLEURA: Airspace opacity at the left lung base, predominantly platelike favoring atelectasis although cannot completely exclude retrocardiac pneumonia. Right lung clear. No pleural effusion. No pneumothorax. HEART AND MEDIASTINUM: Aortic atherosclerosis. No acute abnormality of the cardiac and mediastinal silhouettes. BONES AND SOFT TISSUES: No acute osseous abnormality. IMPRESSION: 1. Airspace opacity at the left lung base, predominantly platelike, favoring atelectasis; retrocardiac pneumonia cannot be completely excluded. Electronically signed by: Franky Crease MD 11/16/2024 01:08 PM EST RP Workstation: [id number]   Microbiology: Results for orders placed or performed during the hospital encounter of 11/16/24  Blood Culture (routine x 2)     Status: None   Collection Time: 11/16/24  4:21 PM   Specimen: BLOOD  Result Value Ref Range Status   Specimen Description   Final    BLOOD LEFT ANTECUBITAL Performed at [facility], [address], [place], [place] [address]    Special Requests   Final    BOTTLES DRAWN AEROBIC AND ANAEROBIC Blood Culture adequate volume Performed at [facility], [address] [place], [place] [address]    Culture   Final    NO GROWTH 5 DAYS Performed at [facility], [address], [place], [place] [address]    Report Status 11/21/2024 FINAL  Final  Blood Culture (routine x 2)  Status: None   Collection Time: 11/16/24  4:26 PM   Specimen: BLOOD  Result Value Ref Range Status   Specimen Description   Final    BLOOD RIGHT ANTECUBITAL Performed at [facility], [address] [place], [place] [address]    Special Requests   Final    BOTTLES DRAWN AEROBIC AND ANAEROBIC Blood Culture adequate  volume Performed at [facility], [address], [place], [place] [address]    Culture   Final    NO GROWTH 5 DAYS Performed at [facility], [address] [place], [place] [address]    Report Status 11/21/2024 FINAL  Final  Resp panel by RT-PCR (RSV, Flu A&B, Covid) Anterior Nasal Swab     Status: None   Collection Time: 11/16/24  4:36 PM   Specimen: Anterior Nasal Swab  Result Value Ref Range Status   SARS Coronavirus 2 by RT PCR NEGATIVE NEGATIVE Final    Comment: (NOTE) SARS-CoV-2 target nucleic acids are NOT DETECTED.  The SARS-CoV-2 RNA is generally detectable in upper respiratory specimens during the acute phase of infection. The lowest concentration of SARS-CoV-2 viral copies this assay can detect is 138 copies/mL. A negative result does not preclude SARS-Cov-2 infection and should not be used as the sole basis for treatment or other patient management decisions. A negative result may occur with  improper specimen collection/handling, submission of specimen other than nasopharyngeal swab, presence of viral mutation(s) within the areas targeted by this assay, and inadequate number of viral copies(<138 copies/mL). A negative result must be combined with clinical observations, patient history, and epidemiological information. The expected result is Negative.  Fact Sheet for Patients:  bloggercourse.com  Fact Sheet for Healthcare Providers:  seriousbroker.it  This test is no t yet approved or cleared by the [place]  FDA and  has been authorized for detection and/or diagnosis of SARS-CoV-2 by FDA under an Emergency Use Authorization (EUA). This EUA will remain  in effect (meaning this test can be used) for the duration of the COVID-19 declaration under Section 564(b)(1) of the Act, 21 U.S.C.section 360bbb-3(b)(1), unless the authorization is terminated  or revoked sooner.       Influenza  A by PCR NEGATIVE NEGATIVE Final   Influenza B by PCR NEGATIVE NEGATIVE Final    Comment: (NOTE) The Xpert Xpress SARS-CoV-2/FLU/RSV plus assay is intended as an aid in the diagnosis of influenza from Nasopharyngeal swab specimens and should not be used as a sole basis for treatment. Nasal washings and aspirates are unacceptable for Xpert Xpress SARS-CoV-2/FLU/RSV testing.  Fact Sheet for Patients: bloggercourse.com  Fact Sheet for Healthcare Providers: seriousbroker.it  This test is not yet approved or cleared by the [place]  FDA and has been authorized for detection and/or diagnosis of SARS-CoV-2 by FDA under an Emergency Use Authorization (EUA). This EUA will remain in effect (meaning this test can be used) for the duration of the COVID-19 declaration under Section 564(b)(1) of the Act, 21 U.S.C. section 360bbb-3(b)(1), unless the authorization is terminated or revoked.     Resp Syncytial Virus by PCR NEGATIVE NEGATIVE Final    Comment: (NOTE) Fact Sheet for Patients: bloggercourse.com  Fact Sheet for Healthcare Providers: seriousbroker.it  This test is not yet approved or cleared by the [place]  FDA and has been authorized for detection and/or diagnosis of SARS-CoV-2 by FDA under an Emergency Use Authorization (EUA). This EUA will remain in effect (meaning this test can be used) for the duration of the  COVID-19 declaration under Section 564(b)(1) of the Act, 21 U.S.C. section 360bbb-3(b)(1), unless the authorization is terminated or revoked.  Performed at [facility], [address] [place], [place] [address]   Respiratory (~20 pathogens) panel by PCR     Status: Abnormal   Collection Time: 11/18/24  7:07 AM   Specimen: Nasopharyngeal Swab; Respiratory  Result Value Ref Range Status   Adenovirus NOT DETECTED NOT DETECTED Final    Coronavirus 229E NOT DETECTED NOT DETECTED Final    Comment: (NOTE) The Coronavirus on the Respiratory Panel, DOES NOT test for the novel  Coronavirus (2019 nCoV)    Coronavirus HKU1 NOT DETECTED NOT DETECTED Final   Coronavirus NL63 NOT DETECTED NOT DETECTED Final   Coronavirus OC43 NOT DETECTED NOT DETECTED Final   Metapneumovirus DETECTED (A) NOT DETECTED Final   Rhinovirus / Enterovirus NOT DETECTED NOT DETECTED Final   Influenza A NOT DETECTED NOT DETECTED Final   Influenza B NOT DETECTED NOT DETECTED Final   Parainfluenza Virus 1 NOT DETECTED NOT DETECTED Final   Parainfluenza Virus 2 NOT DETECTED NOT DETECTED Final   Parainfluenza Virus 3 NOT DETECTED NOT DETECTED Final   Parainfluenza Virus 4 NOT DETECTED NOT DETECTED Final   Respiratory Syncytial Virus NOT DETECTED NOT DETECTED Final   Bordetella pertussis NOT DETECTED NOT DETECTED Final   Bordetella Parapertussis NOT DETECTED NOT DETECTED Final   Chlamydophila pneumoniae NOT DETECTED NOT DETECTED Final   Mycoplasma pneumoniae NOT DETECTED NOT DETECTED Final    Comment: Performed at [facility], [address] [place], [place] [address]   Labs: CBC: Recent Labs  Lab 11/16/24 1411 11/17/24 1327 11/18/24 0421 11/19/24 0936  WBC 12.5* 7.6 9.7 11.1*  HGB 11.9* 10.6* 9.8* 10.5*  HCT 35.6* 32.7* 29.3* 32.5*  MCV 92.2 94.0 93.9 94.5  PLT 307 252 254 303   Basic Metabolic Panel: Recent Labs  Lab 11/16/24 1411 11/17/24 1327 11/18/24 0421 11/19/24 0936 11/20/24 0740  NA 138 137 139 141 140  K 3.3* 3.9 4.7 3.3* 4.1  CL 101 105 108 107 104  CO2 22 22 24 24 25   GLUCOSE 129* 130* 144* 81 80  BUN 11 9 12 13 11   CREATININE 0.62 0.46 0.52 0.60 0.57  CALCIUM  9.4 8.3* 8.7* 8.6* 9.0  MG  --  2.4  --   --   --    Liver Function Tests: Recent Labs  Lab 11/16/24 1411 11/17/24 1327  AST 18 14*  ALT 15 13  ALKPHOS 97 86  BILITOT 0.6 0.4  PROT 6.8 6.1*  ALBUMIN 3.9 3.5   Discharge time spent: > 30  minutes.  Signed: Dr. Sherre [facility] Location: [place]  11/22/2024 "

## 2024-11-22 NOTE — Progress Notes (Signed)
 Communicated with patient via video tablet. Patient appears comfortable in the bed. AVS paperwork given to the patient. Discharge instructions discussed with patient and her daughter, Schuyler. Patients question answered to satisfaction. Patient agreeable with discharge plan.

## 2024-11-22 NOTE — Progress Notes (Signed)
 H@H  medic out to see patient for second visit and discharge. On arrival, I was met at the door by the patients daughter who welcomed me in. See flowsheet for assessment. Pt had no complaints this evening. VSS. Discharge paperwork was explained by virtual RN Shanda with myself in the home. All questions and concerns were answered at this time. The patients home oxygen supply was being delivered on arrival, however, no nebulizer machine was brought out to the home by [employer] at the time of discharge. A H@H  nebulizer machine was left in the home until the patient can receive hers. All other medical equipment and CH devices were removed from the home. No other tasks were needed at this time. H@H  visit complete.

## 2024-11-28 ENCOUNTER — Encounter: Payer: Self-pay | Admitting: Family Medicine

## 2024-11-28 ENCOUNTER — Ambulatory Visit: Admitting: Family Medicine

## 2024-11-28 VITALS — BP 97/59 | HR 72 | Ht 65.0 in | Wt 165.1 lb

## 2024-11-28 DIAGNOSIS — D649 Anemia, unspecified: Secondary | ICD-10-CM | POA: Diagnosis not present

## 2024-11-28 DIAGNOSIS — R5383 Other fatigue: Secondary | ICD-10-CM

## 2024-11-28 DIAGNOSIS — R001 Bradycardia, unspecified: Secondary | ICD-10-CM

## 2024-11-28 DIAGNOSIS — D509 Iron deficiency anemia, unspecified: Secondary | ICD-10-CM

## 2024-11-28 DIAGNOSIS — M810 Age-related osteoporosis without current pathological fracture: Secondary | ICD-10-CM | POA: Diagnosis not present

## 2024-11-28 DIAGNOSIS — Z131 Encounter for screening for diabetes mellitus: Secondary | ICD-10-CM

## 2024-11-28 DIAGNOSIS — R0683 Snoring: Secondary | ICD-10-CM

## 2024-11-28 DIAGNOSIS — E782 Mixed hyperlipidemia: Secondary | ICD-10-CM

## 2024-11-28 DIAGNOSIS — J188 Other pneumonia, unspecified organism: Secondary | ICD-10-CM | POA: Diagnosis not present

## 2024-11-28 NOTE — Progress Notes (Signed)
 "  Established Patient Office Visit  Subjective   Patient ID: Veronica Barrett, female    DOB: Feb 11, 1951  Age: 74 y.o. MRN: [id number]  Chief Complaint  Patient presents with   Hospitalization Follow-up     History of Present Illness   Veronica Barrett is a 74 year old female who presents for follow-up care after a recent hospitalization for pneumonia. She is accompanied by her daughter.  She was recently hospitalized for pneumonia and treated with antibiotics and steroids. She completed antibiotics before discharge. She uses home oxygen at night and checks her oxygen with an oximeter, which has remained in the high nineties with activity.  She has asthma and uses an albuterol  inhaler. She was prescribed a nebulizer medication but has not used it because the pharmacy did not provide it. She feels she can inhale deeply enough with her inhaler.  Her daughters have noticed snoring and gasping during sleep, and there is concern for sleep apnea. She has not had a sleep study.  She had anemia during her hospital stay, with slightly low but stable hemoglobin. She has dizziness when standing quickly, which she links to fatigue from her recent illness.  Her heart rate is usually 55 to 65 beats per minute and drops into the forties during sleep. She reports a family pattern of low heart rate without known heart disease.  She occasionally uses a cane after a leg injury last February. She is increasing her physical activity to improve stamina and lung capacity and uses an incentive spirometer for recovery.          The 10-year ASCVD risk score (Arnett DK, et al., 2019) is: 6.5%  Health Maintenance Due  Topic Date Due   Zoster Vaccines- Shingrix  (1 of 2) Never done   Pneumococcal Vaccine: 50+ Years (2 of 2 - PPSV23, PCV20, or PCV21) 10/26/2018   COVID-19 Vaccine (4 - 2025-26 season) 07/18/2024      Objective:     BP (!) 97/59   Pulse 72   Ht 5' 5 (1.651 m)   Wt 165 lb 1.9 oz (74.9 kg)    SpO2 98%   BMI 27.48 kg/m    Physical Exam   Gen: alert, oriented. Accompanied by daughter.  Cv: rrr, no murmur CHEST: Lungs clear to auscultation bilaterally. No wheezes, rhonchi, or crackles. On room air       No results found for any visits on 11/28/24.      Assessment & Plan:   Multifocal pneumonia Assessment & Plan: Hospital f/u for pneumonia. Recovering with improved lung sounds. Completed antibiotics and steroids. Inhalers effective. Still experiencing fatigue and DOE.   - Continue using albuterol  inhaler as needed. No wheezing or evidence of asthma exacerbation on exam.  - Use incentive spirometer to improve lung capacity. - continues to use oxygen, mostly at night.      Snoring -     PSG Sleep Study; Future  Other fatigue -     PSG Sleep Study; Future  Normocytic anemia Assessment & Plan: Possibly d/t acute illness.  Stable at d/c.  Will recheck prior to her visit w/ pcp.    Asymptomatic bradycardia Assessment & Plan: Heart rate in the 50s, consistent with previous records. Asymptomatic and likely familial. - Continue to monitor heart rate and symptoms.   Age-related osteoporosis without current pathological fracture Assessment & Plan: Gave pt phone number of the imaging location that her bone density scan was sent to. Advised her to call to  schedule.             No follow-ups on file.    Toribio MARLA Slain, MD  "

## 2024-11-28 NOTE — Patient Instructions (Signed)
" °  VISIT SUMMARY: Today we discussed your recovery from pneumonia, management of asthma, use of home oxygen, and concerns about sleep apnea. We also reviewed your anemia, heart rate, and general health maintenance.  YOUR PLAN: PNEUMONIA: You are recovering from pneumonia with improved lung sounds. -Continue using your inhalers as needed. -Use the incentive spirometer to improve lung capacity.  ASTHMA: Your asthma is managed with inhalers and there have been no recent exacerbations. -Continue your current inhaler regimen.  HYPOXEMIA: You are using home oxygen at night and your oxygen levels are stable. We are evaluating for sleep apnea due to snoring and gasping during sleep. -Continue using home oxygen at night. -A sleep study has been ordered to evaluate for sleep apnea.  ANEMIA: Your anemia is likely due to your recent pneumonia. Your hemoglobin levels are stable, but you may feel dizzy due to fatigue and anemia. -Monitor your symptoms and adjust your activity as needed. -We will recheck your hemoglobin levels when you are feeling better.  BRADYCARDIA: Your heart rate is in the 50s, which is consistent with previous records and likely familial. You are not experiencing any symptoms. -Continue to monitor your heart rate and symptoms.  - we will resend your order for the bone density test if necessary   "

## 2024-11-28 NOTE — Assessment & Plan Note (Signed)
 Hospital f/u for pneumonia. Recovering with improved lung sounds. Completed antibiotics and steroids. Inhalers effective. Still experiencing fatigue and DOE.   - Continue using albuterol  inhaler as needed. No wheezing or evidence of asthma exacerbation on exam.  - Use incentive spirometer to improve lung capacity. - continues to use oxygen, mostly at night.

## 2024-11-28 NOTE — Assessment & Plan Note (Signed)
 Gave pt phone number of the imaging location that her bone density scan was sent to. Advised her to call to schedule.

## 2024-11-28 NOTE — Assessment & Plan Note (Signed)
 Possibly d/t acute illness.  Stable at d/c.  Will recheck prior to her visit w/ pcp.

## 2024-11-28 NOTE — Assessment & Plan Note (Signed)
 Heart rate in the 50s, consistent with previous records. Asymptomatic and likely familial. - Continue to monitor heart rate and symptoms.

## 2025-02-13 ENCOUNTER — Other Ambulatory Visit

## 2025-02-20 ENCOUNTER — Ambulatory Visit

## 2025-04-13 ENCOUNTER — Ambulatory Visit
